# Patient Record
Sex: Male | Born: 1945 | Race: White | Hispanic: No | Marital: Married | Smoking: Former smoker
Health system: Southern US, Community
[De-identification: ages and names within clinical notes are randomized; demographics above are authoritative.]

## PROBLEM LIST (undated history)

## (undated) DIAGNOSIS — I10 Essential (primary) hypertension: Secondary | ICD-10-CM

## (undated) DIAGNOSIS — E119 Type 2 diabetes mellitus without complications: Secondary | ICD-10-CM

## (undated) DIAGNOSIS — I639 Cerebral infarction, unspecified: Secondary | ICD-10-CM

## (undated) DIAGNOSIS — I4891 Unspecified atrial fibrillation: Secondary | ICD-10-CM

## (undated) DIAGNOSIS — I4892 Unspecified atrial flutter: Secondary | ICD-10-CM

## (undated) DIAGNOSIS — I499 Cardiac arrhythmia, unspecified: Secondary | ICD-10-CM

## (undated) DIAGNOSIS — M109 Gout, unspecified: Secondary | ICD-10-CM

## (undated) DIAGNOSIS — J189 Pneumonia, unspecified organism: Secondary | ICD-10-CM

## (undated) DIAGNOSIS — Z9289 Personal history of other medical treatment: Secondary | ICD-10-CM

## (undated) DIAGNOSIS — E785 Hyperlipidemia, unspecified: Secondary | ICD-10-CM

## (undated) HISTORY — DX: Type 2 diabetes mellitus without complications: E11.9

## (undated) HISTORY — PX: CATARACT EXTRACTION: SUR2

## (undated) HISTORY — DX: Cerebral infarction, unspecified: I63.9

## (undated) HISTORY — DX: Personal history of other medical treatment: Z92.89

## (undated) HISTORY — DX: Essential (primary) hypertension: I10

---

## 2008-09-19 ENCOUNTER — Emergency Department (HOSPITAL_COMMUNITY): Admission: EM | Admit: 2008-09-19 | Discharge: 2008-09-19 | Payer: Self-pay | Admitting: Emergency Medicine

## 2013-10-07 ENCOUNTER — Inpatient Hospital Stay (HOSPITAL_COMMUNITY)
Admission: EM | Admit: 2013-10-07 | Discharge: 2013-10-10 | DRG: 062 | Disposition: A | Discharge status: Home / Self Care | Payer: Medicare Other | Attending: Neurology | Admitting: Neurology

## 2013-10-07 ENCOUNTER — Inpatient Hospital Stay (HOSPITAL_COMMUNITY): Payer: Medicare Other

## 2013-10-07 ENCOUNTER — Encounter (HOSPITAL_COMMUNITY): Payer: Self-pay | Admitting: Emergency Medicine

## 2013-10-07 ENCOUNTER — Emergency Department (HOSPITAL_COMMUNITY): Payer: Medicare Other

## 2013-10-07 DIAGNOSIS — F1910 Other psychoactive substance abuse, uncomplicated: Secondary | ICD-10-CM | POA: Diagnosis present

## 2013-10-07 DIAGNOSIS — I4892 Unspecified atrial flutter: Secondary | ICD-10-CM | POA: Diagnosis not present

## 2013-10-07 DIAGNOSIS — I6340 Cerebral infarction due to embolism of unspecified cerebral artery: Principal | ICD-10-CM | POA: Diagnosis present

## 2013-10-07 DIAGNOSIS — E785 Hyperlipidemia, unspecified: Secondary | ICD-10-CM | POA: Diagnosis present

## 2013-10-07 DIAGNOSIS — I428 Other cardiomyopathies: Secondary | ICD-10-CM | POA: Diagnosis present

## 2013-10-07 DIAGNOSIS — I639 Cerebral infarction, unspecified: Secondary | ICD-10-CM

## 2013-10-07 DIAGNOSIS — R7303 Prediabetes: Secondary | ICD-10-CM | POA: Diagnosis present

## 2013-10-07 DIAGNOSIS — I480 Paroxysmal atrial fibrillation: Secondary | ICD-10-CM

## 2013-10-07 DIAGNOSIS — Z87891 Personal history of nicotine dependence: Secondary | ICD-10-CM | POA: Diagnosis not present

## 2013-10-07 DIAGNOSIS — Z8673 Personal history of transient ischemic attack (TIA), and cerebral infarction without residual deficits: Secondary | ICD-10-CM

## 2013-10-07 DIAGNOSIS — R4701 Aphasia: Secondary | ICD-10-CM | POA: Diagnosis present

## 2013-10-07 DIAGNOSIS — R7309 Other abnormal glucose: Secondary | ICD-10-CM | POA: Diagnosis present

## 2013-10-07 DIAGNOSIS — I10 Essential (primary) hypertension: Secondary | ICD-10-CM | POA: Diagnosis present

## 2013-10-07 DIAGNOSIS — R29810 Facial weakness: Secondary | ICD-10-CM | POA: Diagnosis present

## 2013-10-07 DIAGNOSIS — Z8249 Family history of ischemic heart disease and other diseases of the circulatory system: Secondary | ICD-10-CM | POA: Diagnosis not present

## 2013-10-07 DIAGNOSIS — I6350 Cerebral infarction due to unspecified occlusion or stenosis of unspecified cerebral artery: Secondary | ICD-10-CM | POA: Diagnosis present

## 2013-10-07 DIAGNOSIS — M109 Gout, unspecified: Secondary | ICD-10-CM | POA: Diagnosis present

## 2013-10-07 HISTORY — DX: Cerebral infarction, unspecified: I63.9

## 2013-10-07 HISTORY — DX: Gout, unspecified: M10.9

## 2013-10-07 HISTORY — DX: Hyperlipidemia, unspecified: E78.5

## 2013-10-07 HISTORY — DX: Unspecified atrial flutter: I48.92

## 2013-10-07 HISTORY — DX: Unspecified atrial fibrillation: I48.91

## 2013-10-07 LAB — ETHANOL: Alcohol, Ethyl (B): <11 mg/dL (ref 0–11)

## 2013-10-07 LAB — I-STAT CHEM 8, ED
BUN: 11 mg/dL (ref 6–23)
Calcium, Ion: 1.12 mmol/L — ABNORMAL LOW (ref 1.13–1.30)
Chloride: 104 mEq/L (ref 96–112)
Creatinine, Ser: 1.2 mg/dL (ref 0.50–1.35)
Glucose, Bld: 188 mg/dL — ABNORMAL HIGH (ref 70–99)
HCT: 44 % (ref 39.0–52.0)
Hemoglobin: 15 g/dL (ref 13.0–17.0)
Potassium: 4 mEq/L (ref 3.7–5.3)
Sodium: 138 mEq/L (ref 137–147)
TCO2: 21 mmol/L (ref 0–100)

## 2013-10-07 LAB — COMPREHENSIVE METABOLIC PANEL
ALBUMIN: 3.8 g/dL (ref 3.5–5.2)
ALT: 18 U/L (ref 0–53)
ANION GAP: 17 — AB (ref 5–15)
AST: 18 U/L (ref 0–37)
Alkaline Phosphatase: 83 U/L (ref 39–117)
BILIRUBIN TOTAL: 0.4 mg/dL (ref 0.3–1.2)
BUN: 12 mg/dL (ref 6–23)
CHLORIDE: 99 meq/L (ref 96–112)
CO2: 22 mEq/L (ref 19–32)
Calcium: 9.3 mg/dL (ref 8.4–10.5)
Creatinine, Ser: 1.09 mg/dL (ref 0.50–1.35)
GFR calc Af Amer: 79 mL/min — ABNORMAL LOW (ref >90)
GFR calc non Af Amer: 68 mL/min — ABNORMAL LOW (ref >90)
GLUCOSE: 193 mg/dL — AB (ref 70–99)
Potassium: 4.3 mEq/L (ref 3.7–5.3)
Sodium: 138 mEq/L (ref 137–147)
Total Protein: 7.1 g/dL (ref 6.0–8.3)

## 2013-10-07 LAB — URINALYSIS, ROUTINE W REFLEX MICROSCOPIC
Bilirubin Urine: NEGATIVE
GLUCOSE, UA: NEGATIVE mg/dL
Hgb urine dipstick: NEGATIVE
Ketones, ur: NEGATIVE mg/dL
LEUKOCYTES UA: NEGATIVE
Nitrite: NEGATIVE
PROTEIN: NEGATIVE mg/dL
Specific Gravity, Urine: 1.013 (ref 1.005–1.030)
UROBILINOGEN UA: 0.2 mg/dL (ref 0.0–1.0)
pH: 5.5 (ref 5.0–8.0)

## 2013-10-07 LAB — DIFFERENTIAL
BASOS PCT: 1 % (ref 0–1)
Basophils Absolute: 0 10*3/uL (ref 0.0–0.1)
Eosinophils Absolute: 0.3 10*3/uL (ref 0.0–0.7)
Eosinophils Relative: 4 % (ref 0–5)
LYMPHS ABS: 1.8 10*3/uL (ref 0.7–4.0)
Lymphocytes Relative: 22 % (ref 12–46)
MONOS PCT: 5 % (ref 3–12)
Monocytes Absolute: 0.4 10*3/uL (ref 0.1–1.0)
NEUTROS ABS: 5.5 10*3/uL (ref 1.7–7.7)
NEUTROS PCT: 68 % (ref 43–77)

## 2013-10-07 LAB — RAPID URINE DRUG SCREEN, HOSP PERFORMED
AMPHETAMINES: NOT DETECTED
BARBITURATES: NOT DETECTED
Benzodiazepines: NOT DETECTED
Cocaine: NOT DETECTED
Opiates: NOT DETECTED
TETRAHYDROCANNABINOL: NOT DETECTED

## 2013-10-07 LAB — PROTIME-INR
INR: 1.08 (ref 0.00–1.49)
Prothrombin Time: 14 seconds (ref 11.6–15.2)

## 2013-10-07 LAB — I-STAT TROPONIN, ED: Troponin i, poc: 0.02 ng/mL (ref 0.00–0.08)

## 2013-10-07 LAB — CBC
HCT: 41.9 % (ref 39.0–52.0)
HEMOGLOBIN: 14.5 g/dL (ref 13.0–17.0)
MCH: 31.3 pg (ref 26.0–34.0)
MCHC: 34.6 g/dL (ref 30.0–36.0)
MCV: 90.5 fL (ref 78.0–100.0)
Platelets: 217 10*3/uL (ref 150–400)
RBC: 4.63 MIL/uL (ref 4.22–5.81)
RDW: 12.8 % (ref 11.5–15.5)
WBC: 8 10*3/uL (ref 4.0–10.5)

## 2013-10-07 LAB — MRSA PCR SCREENING: MRSA by PCR: NEGATIVE

## 2013-10-07 LAB — GLUCOSE, CAPILLARY: Glucose-Capillary: 131 mg/dL — ABNORMAL HIGH (ref 70–99)

## 2013-10-07 LAB — CBG MONITORING, ED
Glucose-Capillary: 150 mg/dL — ABNORMAL HIGH (ref 70–99)
Glucose-Capillary: 103 mg/dL — ABNORMAL HIGH (ref 70–99)

## 2013-10-07 LAB — APTT: APTT: 29 s (ref 24–37)

## 2013-10-07 MED ORDER — SODIUM CHLORIDE 0.9 % IV SOLN
Freq: Once | INTRAVENOUS | Status: AC
  Administered 2013-10-07: 14:00:00 via INTRAVENOUS

## 2013-10-07 MED ORDER — ACETAMINOPHEN 325 MG PO TABS: 650.0000 mg | ORAL_TABLET | ORAL | Status: DC | PRN

## 2013-10-07 MED ORDER — LABETALOL HCL 5 MG/ML IV SOLN: 10.0000 mg | INTRAVENOUS | Status: DC | PRN

## 2013-10-07 MED ORDER — SENNOSIDES-DOCUSATE SODIUM 8.6-50 MG PO TABS
1.0000 | ORAL_TABLET | Freq: Every evening | ORAL | Status: DC | PRN
  Filled 2013-10-07: qty 1

## 2013-10-07 MED ORDER — INSULIN ASPART 100 UNIT/ML ~~LOC~~ SOLN
0.0000 [IU] | Freq: Three times a day (TID) | SUBCUTANEOUS | Status: DC
  Administered 2013-10-08: 3 [IU] via SUBCUTANEOUS
  Administered 2013-10-09: 8 [IU] via SUBCUTANEOUS

## 2013-10-07 MED ORDER — ACETAMINOPHEN 650 MG RE SUPP: 650.0000 mg | RECTAL | Status: DC | PRN

## 2013-10-07 MED ORDER — INSULIN ASPART 100 UNIT/ML ~~LOC~~ SOLN: 0.0000 [IU] | Freq: Every day | SUBCUTANEOUS | Status: DC

## 2013-10-07 MED ORDER — LABETALOL HCL 5 MG/ML IV SOLN
INTRAVENOUS | Status: AC
  Filled 2013-10-07: qty 4

## 2013-10-07 MED ORDER — IOHEXOL 350 MG/ML SOLN
50.0000 mL | Freq: Once | INTRAVENOUS | Status: AC | PRN
  Administered 2013-10-07: 50 mL via INTRAVENOUS

## 2013-10-07 MED ORDER — ALTEPLASE (STROKE) FULL DOSE INFUSION
79.0000 mg | Freq: Once | INTRAVENOUS | Status: AC
  Administered 2013-10-07: 79 mg via INTRAVENOUS
  Filled 2013-10-07: qty 79

## 2013-10-07 MED ORDER — LABETALOL HCL 5 MG/ML IV SOLN
5.0000 mg | Freq: Once | INTRAVENOUS | Status: AC
  Administered 2013-10-07: 5 mg via INTRAVENOUS

## 2013-10-07 MED ORDER — PANTOPRAZOLE SODIUM 40 MG IV SOLR
40.0000 mg | Freq: Every day | INTRAVENOUS | Status: DC
  Administered 2013-10-07 – 2013-10-08 (×2): 40 mg via INTRAVENOUS
  Filled 2013-10-07 (×3): qty 40

## 2013-10-07 MED ORDER — SODIUM CHLORIDE 0.9 % IV SOLN
INTRAVENOUS | Status: DC
Start: 2013-10-07 — End: 2013-10-08
  Administered 2013-10-08: 04:00:00 via INTRAVENOUS

## 2013-10-07 MED ORDER — STROKE: EARLY STAGES OF RECOVERY BOOK
Freq: Once | Status: AC
  Administered 2013-10-07: 21:00:00
  Filled 2013-10-07: qty 1

## 2013-10-07 NOTE — ED Notes (Signed)
Diet tray ordered 

## 2013-10-07 NOTE — Code Documentation (Signed)
68yo male arriving to [facility] via [facility] at 1218.  Patient was LKW at 1115 per family when his son talked to him over the phone.  Per EMS a man came over to the patient's house to pay for a car and when patient was addressed by the individual he had difficulty talking.  The man called his son at 20 to report the issue.  EMS was activated and called a Code Stroke for expressive aphasia.  Patient was cleared at the bridge and was taken to CT.  Pharmacy notified to mix tPA at 1230 and delivered tPA at 1239.  NIHSS 5, see documentation for details and times.  tPA bolus given at 1243 followed by the drip.  SBP 186 at 1245 and tPA on hold per Dr. Amada Jupiter.  SBP 178 at 1250 and tPA restarted per Dr. Amada Jupiter.  Labetalol 5mg  IVP given per MD.  Patient taken to CTA and results reviewed by MD.  Plan of care discussed with patient and family.  Patient to be admitted to ICU.  NS flush hung at 1337.  Bedside handoff with ED RN Greig Castilla.

## 2013-10-07 NOTE — H&P (Signed)
H&P    Chief Complaint: Stroke  HPI:                                                                                                                                         Jonathon Stevens is an 68 y.o. male with non significant PMHx other than gout.  Patient was out in his yard selling a car when the buyer noted he was having difficulty expressing himself and had a right facial droop.  EMS was called and on scene noted his right facial asymmetry and expressive aphasia. Pateint was brought to [facility] ED where CT head was negative but patient had persistent expressive aphasia. The discussion was made with his wife and tPA was started.   Date last known well: Date: 10/07/2013 Time last known well: Time: 08:30 tPA Given: Yes  Past Medical History  Diagnosis Date  . Gout     No past surgical history on file.  Family History  Problem Relation Age of Onset  . Hypertension Mother   . Hypertension Father    Social History:  reports that he uses illicit drugs about once per week. He reports that he does not drink alcohol. His tobacco history is not on file.  Allergies: No Known Allergies  Medications:                                                                                                                           Current Facility-Administered Medications  Medication Dose Route Frequency Provider Last Rate Last Dose  . alteplase (ACTIVASE) 1 mg/mL infusion 79 mg  79 mg Intravenous Once Raeford Razor, MD 79 mL/hr at 10/07/13 1253 79 mg at 10/07/13 1253   No current outpatient prescriptions on file.     ROS:  History obtained from family  General ROS: negative for - chills, fatigue, fever, night sweats, weight gain or weight loss Psychological ROS: negative for - behavioral disorder, hallucinations, memory difficulties, mood swings or suicidal  ideation Ophthalmic ROS: negative for - blurry vision, double vision, eye pain or loss of vision ENT ROS: negative for - epistaxis, nasal discharge, oral lesions, sore throat, tinnitus or vertigo Allergy and Immunology ROS: negative for - hives or itchy/watery eyes Hematological and Lymphatic ROS: negative for - bleeding problems, bruising or swollen lymph nodes Endocrine ROS: negative for - galactorrhea, hair pattern changes, polydipsia/polyuria or temperature intolerance Respiratory ROS: negative for - cough, hemoptysis, shortness of breath or wheezing Cardiovascular ROS: negative for - chest pain, dyspnea on exertion, edema or irregular heartbeat Gastrointestinal ROS: negative for - abdominal pain, diarrhea, hematemesis, nausea/vomiting or stool incontinence Genito-Urinary ROS: negative for - dysuria, hematuria, incontinence or urinary frequency/urgency Musculoskeletal ROS: negative for - joint swelling or muscular weakness Neurological ROS: as noted in HPI Dermatological ROS: negative for rash and skin lesion changes  Neurologic Examination:                                                                                                      Blood pressure 176/69, pulse 72, temperature 97.9 F (36.6 C), temperature source Oral, resp. rate 16, height 5\' 8"  (1.727 m), weight 87.091 kg (192 lb), SpO2 97.00%.   General Exam: CV: RRR S1, S2 ABD: Soft NT,ND Lungs: Clear to ausculation. Skin: WDI   General: In bed with NAD Mental Status: Alert, oriented, thought content appropriate.  Speech shows expressive aphasia.  Able to follow commands without difficulty. Cranial Nerves: II: Discs flat bilaterally; Visual fields grossly normal, pupils equal, round, reactive to light and accommodation III,IV, VI: ptosis not present, extra-ocular motions intact bilaterally V,VII: Right facial weakness, facial light touch sensation normal bilaterally VIII: hearing normal bilaterally IX,X: gag reflex  present XI: bilateral shoulder shrug XII: midline tongue extension without atrophy or fasciculations  Motor: Right : Upper extremity   5/5    Left:     Upper extremity   5/5  Lower extremity   5/5     Lower extremity   5/5 Tone and bulk:normal tone throughout; no atrophy noted Sensory: Pinprick and light touch intact throughout, bilaterally Deep Tendon Reflexes:  Right: Upper Extremity   Left: Upper extremity   biceps (C-5 to C-6) 2/4   biceps (C-5 to C-6) 2/4 tricep (C7) 2/4    triceps (C7) 2/4 Brachioradialis (C6) 2/4  Brachioradialis (C6) 2/4  Lower Extremity Lower Extremity  quadriceps (L-2 to L-4) 2/4   quadriceps (L-2 to L-4) 2/4 Achilles (S1) 2/4   Achilles (S1) 2/4  Plantars: Right: downgoing   Left: downgoing Cerebellar: normal finger-to-nose,  normal heel-to-shin test Gait: not tested CV: pulses palpable throughout    Lab Results: Basic Metabolic Panel:  Recent Labs Lab 10/07/13 1226  NA 138  K 4.0  CL 104  GLUCOSE 188*  BUN 11  CREATININE 1.20    Liver Function Tests: No results found for this basename: AST,  ALT, ALKPHOS, BILITOT, PROT, ALBUMIN,  in the last 168 hours No results found for this basename: LIPASE, AMYLASE,  in the last 168 hours No results found for this basename: AMMONIA,  in the last 168 hours  CBC:  Recent Labs Lab 10/07/13 1222 10/07/13 1226  WBC 8.0  --   NEUTROABS 5.5  --   HGB 14.5 15.0  HCT 41.9 44.0  MCV 90.5  --   PLT 217  --     Cardiac Enzymes: No results found for this basename: CKTOTAL, CKMB, CKMBINDEX, TROPONINI,  in the last 168 hours  Lipid Panel: No results found for this basename: CHOL, TRIG, HDL, CHOLHDL, VLDL, LDLCALC,  in the last 168 hours  CBG:  Recent Labs Lab 10/07/13 1238  GLUCAP 150*    Microbiology: No results found for this or any previous visit.  Coagulation Studies:  Recent Labs  10/07/13 1222  LABPROT 14.0  INR 1.08    Imaging: Ct Head Wo Contrast  10/07/2013   CLINICAL  DATA:  Eight day shift.  Facial droop.  EXAM: CT HEAD WITHOUT CONTRAST  TECHNIQUE: Contiguous axial images were obtained from the base of the skull through the vertex without intravenous contrast.  COMPARISON:  None.  FINDINGS: No intracranial hemorrhage.  The left carotid terminus appears dense which may indicate thrombus potentially leading to left hemispheric infarct.  Remote small left cerebellar infarcts suspected rather than horizontal fissure.  Streak artifact through the pons.  No hydrocephalus.  No intracranial mass lesion noted on this unenhanced exam.  Left sphenoid sinus air cell mild mucosal thickening and ethmoid sinus air cell mild mucosal thickening bilaterally.  IMPRESSION: No intracranial hemorrhage.  The left carotid terminus appears dense which may indicate thrombus potentially leading to left hemispheric infarct.  Remote small left cerebellar infarcts suspected rather than horizontal fissure.  Streak artifact through the pons.  Mild mucosal thickening left sphenoid sinus air cell and ethmoid sinus air cells.  These results were called by telephone at the time of interpretation on 10/07/2013 at 12:35 pm to Dr. Ritta Slot , who verbally acknowledged these results.   Electronically Signed   By: Bridgett Larsson M.D.   On: 10/07/2013 12:44       Assessment and plan discussed with with attending physician and they are in agreement.    Felicie Morn PA-C [facility] [phone]  10/07/2013, 1:02 PM   Assessment: 68 y.o. male presenting to hospital with acute CVA with symptoms of sudden onset expressive aphasia and right facial droop . Patient was within the tPA window and tPA was initiated. Patient will be admitted to the hospital ICU floor.  Stroke Risk Factors - none  1. HgbA1c, fasting lipid panel 2. MRI, MRA  of the brain without contrast 3. PT consult, OT consult, Speech consult 4. Echocardiogram 5. Carotid dopplers 6. Prophylactic therapy-None 7. Risk factor  modification 8. Telemetry monitoring 9. Frequent neuro checks 10. P control to keep <180/105  This patient is critically ill and at significant risk of neurological worsening, death and care requires constant monitoring of vital signs, hemodynamics,respiratory and cardiac monitoring, neurological assessment, discussion with family, other specialists and medical decision making of high complexity. I spent 50 minutes of neurocritical care time  in the care of  this patient.  Ritta Slot, MD [facility] [phone]  If 7pm- 7am, please page neurology on call as listed in AMION. 10/07/2013  6:41 PM

## 2013-10-07 NOTE — ED Notes (Signed)
Patient transported by [facility] EMS for possible stroke.  Patient has slurred speech, right sided facial droop and trouble forming words.  Last seen normal 0830.

## 2013-10-08 ENCOUNTER — Encounter (HOSPITAL_COMMUNITY): Payer: Self-pay | Admitting: Neurology

## 2013-10-08 ENCOUNTER — Inpatient Hospital Stay (HOSPITAL_COMMUNITY): Payer: Medicare Other

## 2013-10-08 DIAGNOSIS — I517 Cardiomegaly: Secondary | ICD-10-CM

## 2013-10-08 LAB — LIPID PANEL
Cholesterol: 175 mg/dL (ref 0–200)
HDL: 41 mg/dL (ref >39)
LDL CALC: 111 mg/dL — AB (ref 0–99)
Total CHOL/HDL Ratio: 4.3 RATIO
Triglycerides: 114 mg/dL (ref <150)
VLDL: 23 mg/dL (ref 0–40)

## 2013-10-08 LAB — GLUCOSE, CAPILLARY
GLUCOSE-CAPILLARY: 102 mg/dL — AB (ref 70–99)
GLUCOSE-CAPILLARY: 190 mg/dL — AB (ref 70–99)
Glucose-Capillary: 106 mg/dL — ABNORMAL HIGH (ref 70–99)
Glucose-Capillary: 133 mg/dL — ABNORMAL HIGH (ref 70–99)

## 2013-10-08 LAB — HEMOGLOBIN A1C
Hgb A1c MFr Bld: 6.5 % — ABNORMAL HIGH (ref <5.7)
Mean Plasma Glucose: 140 mg/dL — ABNORMAL HIGH (ref <117)

## 2013-10-08 MED ORDER — SODIUM CHLORIDE 0.9 % IV SOLN
INTRAVENOUS | Status: DC
  Administered 2013-10-08: 23:00:00 via INTRAVENOUS
  Administered 2013-10-09: 500 mL via INTRAVENOUS

## 2013-10-08 MED ORDER — ASPIRIN EC 325 MG PO TBEC
325.0000 mg | DELAYED_RELEASE_TABLET | Freq: Every day | ORAL | Status: DC
  Administered 2013-10-08 – 2013-10-10 (×2): 325 mg via ORAL
  Filled 2013-10-08 (×2): qty 1

## 2013-10-08 NOTE — Evaluation (Signed)
Physical Therapy Evaluation and Discharge Patient Details Name: Jonathon Stevens MRN: [id number] DOB: July 05, 1945 Today's Date: 10/08/2013   History of Present Illness  Jonathon Stevens is an 68 y.o. male with non significant PMHx other than gout. Patient was out in his yard selling a car when the buyer noted he was having difficulty expressing himself and had a right facial droop. EMS was called and on scene noted his right facial asymmetry and expressive aphasia. Pateint was brought to [facility] ED where CT head was negative but patient had persistent expressive aphasia. The discussion was made with his wife and tPA was started.    Clinical Impression  Pt admitted with above. Pt now functioning at baseline. Pt with no acute PT needs at this time. PT signing off, thank you for the referral and re-consult if needed in future.    Follow Up Recommendations No PT follow up    Equipment Recommendations  None recommended by PT    Recommendations for Other Services       Precautions / Restrictions Precautions Precautions: None Restrictions Weight Bearing Restrictions: No      Mobility  Bed Mobility Overal bed mobility: Independent                Transfers Overall transfer level: Modified independent Equipment used: None             General transfer comment: use of hands  Ambulation/Gait Ambulation/Gait assistance: Independent Ambulation Distance (Feet): 500 Feet Assistive device: None     Gait velocity interpretation: at or above normal speed for age/gender General Gait Details: no episodes of LOB, safe pattern  Stairs Stairs: Yes Stairs assistance: Modified independent (Device/Increase time) Stair Management: One rail Right;Alternating pattern Number of Stairs: 12    Wheelchair Mobility    Modified Rankin (Stroke Patients Only) Modified Rankin (Stroke Patients Only) Pre-Morbid Rankin Score: No symptoms Modified Rankin: No symptoms     Balance Overall  balance assessment: Independent                               Standardized Balance Assessment Standardized Balance Assessment : Dynamic Gait Index   Dynamic Gait Index Level Surface: Normal Change in Gait Speed: Normal Gait with Horizontal Head Turns: Normal Gait with Vertical Head Turns: Normal Gait and Pivot Turn: Normal Step Over Obstacle: Normal Step Around Obstacles: Normal Steps: Mild Impairment Total Score: 23       Pertinent Vitals/Pain Denies pain    Home Living Family/patient expects to be discharged to:: Private residence Living Arrangements: Spouse/significant other Available Help at Discharge: Family;Available PRN/intermittently Type of Home: House Home Access: Stairs to enter Entrance Stairs-Rails: None Entrance Stairs-Number of Steps: 3 Home Layout: One level Home Equipment: None      Prior Function Level of Independence: Independent         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     Hand Dominance   Dominant Hand: Right    Extremity/Trunk Assessment   Upper Extremity Assessment: Overall WFL for tasks assessed           Lower Extremity Assessment: Overall WFL for tasks assessed      Cervical / Trunk Assessment: Normal  Communication   Communication: No difficulties  Cognition Arousal/Alertness: Awake/alert Behavior During Therapy: WFL for tasks assessed/performed Overall Cognitive Status: Within Functional Limits for tasks assessed  General Comments      Exercises        Assessment/Plan    PT Assessment Patent does not need any further PT services  PT Diagnosis     PT Problem List    PT Treatment Interventions     PT Goals (Current goals can be found in the Care Plan section) Acute Rehab PT Goals Patient Stated Goal: home asap PT Goal Formulation: No goals set, d/c therapy    Frequency     Barriers to discharge        Co-evaluation               End of Session   Activity  Tolerance: Patient tolerated treatment well Patient left: in bed;with call bell/phone within reach;with family/visitor present Nurse Communication: Mobility status         Time: 8250-5397 PT Time Calculation (min): 20 min   Charges:   PT Evaluation $Initial PT Evaluation Tier I: 1 Procedure PT Treatments $Gait Training: 8-22 mins   PT G CodesMarcene Brawn 10/08/2013, 4:43 PM  Lewis Shock, PT, DPT Pager #: [phone] Office #: [phone]

## 2013-10-08 NOTE — Progress Notes (Signed)
    Pt is scheduled for a TEE with Dr. Rennis Golden tomorrow. I explained purpose and risk of procedure. Orders are written. Pt will be NPO at midnight. All questions were answered. Order was placed for RN to have patient sign consent.   Robbie Lis, PA-C

## 2013-10-08 NOTE — Progress Notes (Signed)
Patient walked up to the unit with no difficulties. He is AAOx4, no pain, vitals stable, telemetry placed. Patient was oriented to the room and questions were answered. Will continue to monitor. Ashton Sabine, [place]

## 2013-10-08 NOTE — Progress Notes (Signed)
Pt transported to [address] [place] with no incident.

## 2013-10-08 NOTE — Progress Notes (Signed)
SLP Cancellation Note  Patient Details Name: Jeannette Palladino MRN: [id number] DOB: 1945-11-08   Cancelled treatment:       Reason Eval/Treat Not Completed: SLP screened, no needs identified, will sign off   Blenda Mounts Laurice 10/08/2013, 3:36 PM

## 2013-10-08 NOTE — Progress Notes (Signed)
Stroke Team Progress Note  HISTORY Chief Complaint: Stroke  HPI:  Jonathon Stevens is an 68 y.o. male with non significant PMHx other than gout. Patient was out in his yard selling a car when the buyer noted he was having difficulty expressing himself and had a right facial droop. EMS was called and on scene noted his right facial asymmetry and expressive aphasia. Pateint was brought to [facility] ED where CT head was negative but patient had persistent expressive aphasia. The discussion was made with his wife and tPA was started.  Date last known well: Date: 10/07/2013  Time last known well: Time: 08:30  tPA Given: Yes   He was admitted to the neuro ICU for further evaluation and treatment.  SUBJECTIVE  No acute events overnight. Family is at the bedside. The patient is alert and conversant, and states his speech has improved completely back to baseline. Blood pressure was well controlled last night. OBJECTIVE Most recent Vital Signs: Filed Vitals:   10/08/13 0500 10/08/13 0600 10/08/13 0700 10/08/13 0800  BP: 118/69 125/79 130/66 142/62  Pulse: 50 53 52 58  Temp:      TempSrc:      Resp: 9 14 13 14   Height:      Weight:      SpO2: 97% 94% 93% 94%   CBG (last 3)   Recent Labs  10/07/13 1652 10/07/13 2224 10/08/13 0751  GLUCAP 103* 131* 102*    IV Fluid Intake:   . sodium chloride 75 mL/hr at 10/08/13 0400    MEDICATIONS  . insulin aspart  0-15 Units Subcutaneous TID WC  . insulin aspart  0-5 Units Subcutaneous QHS  . pantoprazole (PROTONIX) IV  40 mg Intravenous QHS   PRN:  acetaminophen, acetaminophen, labetalol, senna-docusate  Diet:  General thin liquids Activity:  Bedrest DVT Prophylaxis: none, got tPA  CLINICALLY SIGNIFICANT STUDIES Basic Metabolic Panel:  Recent Labs Lab 10/07/13 1222 10/07/13 1226  NA 138 138  K 4.3 4.0  CL 99 104  CO2 22  --   GLUCOSE 193* 188*  BUN 12 11  CREATININE 1.09 1.20  CALCIUM 9.3  --    Liver Function Tests:  Recent  Labs Lab 10/07/13 1222  AST 18  ALT 18  ALKPHOS 83  BILITOT 0.4  PROT 7.1  ALBUMIN 3.8   CBC:  Recent Labs Lab 10/07/13 1222 10/07/13 1226  WBC 8.0  --   NEUTROABS 5.5  --   HGB 14.5 15.0  HCT 41.9 44.0  MCV 90.5  --   PLT 217  --    Coagulation:  Recent Labs Lab 10/07/13 1222  LABPROT 14.0  INR 1.08   Cardiac Enzymes: No results found for this basename: CKTOTAL, CKMB, CKMBINDEX, TROPONINI,  in the last 168 hours Urinalysis:  Recent Labs Lab 10/07/13 1400  COLORURINE YELLOW  LABSPEC 1.013  PHURINE 5.5  GLUCOSEU NEGATIVE  HGBUR NEGATIVE  BILIRUBINUR NEGATIVE  KETONESUR NEGATIVE  PROTEINUR NEGATIVE  UROBILINOGEN 0.2  NITRITE NEGATIVE  LEUKOCYTESUR NEGATIVE   Lipid Panel    Component Value Date/Time   CHOL 175 10/08/2013 0241   TRIG 114 10/08/2013 0241   HDL 41 10/08/2013 0241   CHOLHDL 4.3 10/08/2013 0241   VLDL 23 10/08/2013 0241   LDLCALC 111* 10/08/2013 0241   HgbA1C  No results found for this basename: HGBA1C    Urine Drug Screen:     Component Value Date/Time   LABOPIA NONE DETECTED 10/07/2013 1400   COCAINSCRNUR NONE DETECTED 10/07/2013 1400  LABBENZ NONE DETECTED 10/07/2013 1400   AMPHETMU NONE DETECTED 10/07/2013 1400   THCU NONE DETECTED 10/07/2013 1400   LABBARB NONE DETECTED 10/07/2013 1400    Alcohol Level:  Recent Labs Lab 10/07/13 1222  ETH <11    Ct Angio Head W/cm &/or Wo Cm  10/07/2013    IMPRESSION: 1. Left MCA M2 branch 2 mm filling defect with M2 stenosis and possible associated M3 branch occlusion. 2. Abundant soft plaque left carotid bifurcation. No hemodynamically significant left ICA stenosis. Incidental short-segment left ECA occlusion. 3. Mild-to-moderate extracranial atherosclerosis elsewhere. Intracranial CTA otherwise negative. 4. No CT changes of cortically based infarct, mass effect, or hemorrhage. Questionable early hypodensity left corona radiata.   Ct Head Wo Contrast  10/07/2013  IMPRESSION: No intracranial  hemorrhage.  The left carotid terminus appears dense which may indicate thrombus potentially leading to left hemispheric infarct.  Remote small left cerebellar infarcts suspected rather than horizontal fissure.  Streak artifact through the pons.  Mild mucosal thickening left sphenoid sinus air cell and ethmoid sinus air cells.    Ct Angio Neck W/cm &/or Wo/cm  10/07/2013    IMPRESSION: 1. Left MCA M2 branch 2 mm filling defect with M2 stenosis and possible associated M3 branch occlusion. 2. Abundant soft plaque left carotid bifurcation. No hemodynamically significant left ICA stenosis. Incidental short-segment left ECA occlusion. 3. Mild-to-moderate extracranial atherosclerosis elsewhere. Intracranial CTA otherwise negative. 4. No CT changes of cortically based infarct, mass effect, or hemorrhage. Questionable early hypodensity left corona radiata.   Dg Chest Port 1 View  10/07/2013   IMPRESSION: Borderline cardiomegaly.  No acute findings.     MRI of the brain    MRA of the brain    Carotid Doppler  Cancelled, did CTA neck  2D Echocardiogram    EKG Sinus rhythm. For complete results please see formal report.   Therapy Recommendations   Physical Exam Blood pressure 142/62, pulse 58, temperature 98.4 F (36.9 C), temperature source Oral, resp. rate 14, height 5\' 8"  (1.727 m), weight 87.091 kg (192 lb), SpO2 94.00%. Gen: Patient is well developed, well nourished man in no acute distress.  Cardiac: RRR. S1S2 audible. No M/R/G.  Extremities: Cap refill <2 secs. No cyanosis or edema. Pulses 2+ radial and DP. Pulmonary: Respirations regular, symmetric. Lungs clear to auscultation bilat. Abd: Soft, non-tender. BS audible x 4 quadrants.  G/U: Deferred  MS: Alert, follows commands. Oriented to person, place, time, and event. Speech: Speech fluent and non-dysarthric. Able to name and repeat. No alexia or agraphia.   CN: No visual field cut. PERRL. EOMs intact. Facial sensation intact V1-3. No  facial droop. Hearing grossly intact. Strong cough. Sternocleidomastoids and trapezius 5/5 strength. Tongue midline, full strength, no atrophy or fasciculations.  Strength: 5/5 in all four extremities proximally and distally.  Sensation: Intact to light touch in all four extremities.  Coordination: No ataxia or dysmetria on FTN or HTS bilat. Gait steady.  Proprioception: Negative Romberg.   Reflexes: 2+ biceps, brachioradialis bilat. 2+ patellar, achilles bilat. No clonus. Downgoing toes bilat.  NIHSS 0  ASSESSMENT [name] is a 68 y.o. male presenting with R facial droop and aphasia. Status post IV t-PA 10/07/13 at 1239. Imaging suspect left brain infarct. Infarct felt to be embolic underlying etiology unknown.  Patient not on antithrombotics  prior to admission. Currently holding antithrombotics for 24 hours post tPA.Marland Kitchen. Patient with resultant . Stroke work up underway.   TEE, LOOP tomorrow, NPO after 2400  LDL  PT/OT/Speech   Hospital day # 1  TREATMENT/PLAN Continue strict neurological follow up and blood pressure management per post TPA protocol. Check 24 brain imaging and if negative for bleed start aspirin. Mobilize out of bed with therapy consults. Transferred out of the ICU later today. Discussed with patient and family and answered questions SIGNED Stephani Police, NP  I, the attending vascular neurologist, have personally obtained a history, examined the patient, evaluated laboratory data and imaging studies, and formulated the assessment and plan of care.  I have made any additions or clarifications directly to the above note and agree with the findings and plan as currently documented. Delia Heady, MD   To contact Stroke Continuity provider, please refer to [email or web address]. After hours, contact General Neurology

## 2013-10-08 NOTE — Progress Notes (Signed)
  Echocardiogram 2D Echocardiogram has been performed.  Arvil Chaco 10/08/2013, 9:39 AM

## 2013-10-08 NOTE — Progress Notes (Signed)
UR completed.  Chlora Mcbain, RN BSN MHA CCM Trauma/Neuro ICU Case Manager [phone]  

## 2013-10-09 ENCOUNTER — Encounter (HOSPITAL_COMMUNITY)
Admission: EM | Disposition: A | Discharge status: Home / Self Care | Payer: Self-pay | Source: Home / Self Care | Attending: Neurology

## 2013-10-09 ENCOUNTER — Encounter (HOSPITAL_COMMUNITY): Payer: Self-pay | Admitting: *Deleted

## 2013-10-09 DIAGNOSIS — I4892 Unspecified atrial flutter: Secondary | ICD-10-CM | POA: Diagnosis present

## 2013-10-09 DIAGNOSIS — I428 Other cardiomyopathies: Secondary | ICD-10-CM

## 2013-10-09 HISTORY — PX: TEE WITHOUT CARDIOVERSION: SHX5443

## 2013-10-09 LAB — GLUCOSE, CAPILLARY
GLUCOSE-CAPILLARY: 101 mg/dL — AB (ref 70–99)
GLUCOSE-CAPILLARY: 128 mg/dL — AB (ref 70–99)
Glucose-Capillary: 265 mg/dL — ABNORMAL HIGH (ref 70–99)
Glucose-Capillary: 127 mg/dL — ABNORMAL HIGH (ref 70–99)

## 2013-10-09 SURGERY — ECHOCARDIOGRAM, TRANSESOPHAGEAL
Anesthesia: Moderate Sedation

## 2013-10-09 MED ORDER — ATORVASTATIN CALCIUM 10 MG PO TABS
10.0000 mg | ORAL_TABLET | Freq: Every day | ORAL | Status: DC
  Administered 2013-10-09: 10 mg via ORAL
  Filled 2013-10-09: qty 1

## 2013-10-09 MED ORDER — MIDAZOLAM HCL 10 MG/2ML IJ SOLN
INTRAMUSCULAR | Status: DC | PRN
  Administered 2013-10-09 (×2): 2 mg via INTRAVENOUS

## 2013-10-09 MED ORDER — LIDOCAINE VISCOUS 2 % MT SOLN
OROMUCOSAL | Status: DC | PRN
  Administered 2013-10-09: 12 mL via OROMUCOSAL

## 2013-10-09 MED ORDER — FENTANYL CITRATE 0.05 MG/ML IJ SOLN
INTRAMUSCULAR | Status: DC | PRN
  Administered 2013-10-09 (×2): 25 ug via INTRAVENOUS

## 2013-10-09 MED ORDER — FENTANYL CITRATE 0.05 MG/ML IJ SOLN
INTRAMUSCULAR | Status: AC
  Filled 2013-10-09: qty 2

## 2013-10-09 MED ORDER — MIDAZOLAM HCL 5 MG/ML IJ SOLN
INTRAMUSCULAR | Status: AC
  Filled 2013-10-09: qty 2

## 2013-10-09 MED ORDER — METOPROLOL TARTRATE 12.5 MG HALF TABLET
12.5000 mg | ORAL_TABLET | Freq: Two times a day (BID) | ORAL | Status: DC
  Administered 2013-10-09 – 2013-10-10 (×2): 12.5 mg via ORAL
  Filled 2013-10-09 (×2): qty 1

## 2013-10-09 MED ORDER — BUTAMBEN-TETRACAINE-BENZOCAINE 2-2-14 % EX AERO
INHALATION_SPRAY | CUTANEOUS | Status: DC | PRN
  Administered 2013-10-09: 2 via TOPICAL

## 2013-10-09 MED ORDER — METOPROLOL TARTRATE 1 MG/ML IV SOLN
2.5000 mg | INTRAVENOUS | Status: DC | PRN
  Administered 2013-10-09: 2.5 mg via INTRAVENOUS
  Filled 2013-10-09: qty 5

## 2013-10-09 MED ORDER — LIDOCAINE VISCOUS 2 % MT SOLN
OROMUCOSAL | Status: AC
  Filled 2013-10-09: qty 15

## 2013-10-09 MED ORDER — MIDAZOLAM HCL 5 MG/ML IJ SOLN
INTRAMUSCULAR | Status: AC
  Filled 2013-10-09: qty 1

## 2013-10-09 NOTE — Progress Notes (Addendum)
Patient in new atrial fibrillation w/ RVR post TEE. Embolic source of stroke now identified. Loop recorder canceled. Cardiology consult requested for atrial fibrillation eval and management. Will not discharge today as planned.  Discussed with Dr. Pearlean Brownie as well as pt's wife via telephone.  Annie Main, MSN, RN, ANVP-BC, ANP-BC, GNP-BC [facility] Pager: [phone] 10/09/2013 3:28 PM

## 2013-10-09 NOTE — Progress Notes (Signed)
MD notified patient is in AFIB.  EKG was done by this RN and has notified Jannetta Quint, NP about results.  Will continue to monitor patient.

## 2013-10-09 NOTE — H&P (Signed)
     INTERVAL PROCEDURE H&P  History and Physical Interval Note:  10/09/2013 12:20 PM  Jonathon Stevens has presented today for their planned procedure. The various methods of treatment have been discussed with the patient and family. After consideration of risks, benefits and other options for treatment, the patient has consented to the procedure.  The patients' outpatient history has been reviewed, patient examined, and no change in status from most recent office note within the past 30 days. I have reviewed the patients' chart and labs and will proceed as planned. Questions were answered to the patient's satisfaction.   Chrystie Nose, MD, [facility] Attending Cardiologist [facility]  Shatha Hooser C 10/09/2013, 12:20 PM

## 2013-10-09 NOTE — CV Procedure (Addendum)
    TRANSESOPHAGEAL ECHOCARDIOGRAM (TEE) NOTE  INDICATIONS: Stroke, source of embolus  PROCEDURE:   Informed consent was obtained prior to the procedure. The risks, benefits and alternatives for the procedure were discussed and the patient comprehended these risks.  Risks include, but are not limited to, cough, sore throat, vomiting, nausea, somnolence, esophageal and stomach trauma or perforation, bleeding, low blood pressure, aspiration, pneumonia, infection, trauma to the teeth and death.    After a procedural time-out, the patient was given 4 mg versed and 50 mcg fentanyl for moderate sedation.  The oropharynx was anesthetized 10 cc of topical 1% viscous lidocaine and 2 cetacaine sprays.  The transesophageal probe was inserted in the esophagus and stomach without difficulty and multiple views were obtained.  The patient was kept under observation until the patient left the procedure room.  The patient left the procedure room in stable condition.   Agitated microbubble saline contrast was administered.  COMPLICATIONS:    There were no immediate complications.  Findings:  1. LEFT VENTRICLE: The left ventricular wall thickness is mildly increased.  The left ventricular cavity is normal in size. Wall motion is normal.  LVEF is 50-55%.  2. RIGHT VENTRICLE:  The right ventricle is normal in structure and function without any thrombus or masses.    3. LEFT ATRIUM:  The left atrium is moderately dilated in size without any thrombus or masses.  There is spontaneous echo contrast ("smoke") in the left atrium consistent with a low flow state.  4. LEFT ATRIAL APPENDAGE:  The left atrial demonstrates low emptying velocity. The moderate-sized appendage has single lobes. Pulse doppler indicates low flow in the appendage.  5. ATRIAL SEPTUM:  The atrial septum appears intact and is free of thrombus and/or masses.  There is no evidence for interatrial shunting by color doppler and saline  microbubble.  6. RIGHT ATRIUM:  The right atrium is normal in size and function without any thrombus or masses.  7. MITRAL VALVE:  The mitral valve is normal in structure and function with trace to mild regurgitation.  There were no vegetations or stenosis.  8. AORTIC VALVE:  The aortic valve is normal in structure and function with no regurgitation.  There were no vegetations or stenosis  9. TRICUSPID VALVE:  The tricuspid valve is normal in structure and function with trivial regurgitation.  There were no vegetations or stenosis  10.  PULMONIC VALVE:  The pulmonic valve is normal in structure and function with no regurgitation.  There were no vegetations or stenosis.   11. AORTIC ARCH, ASCENDING AND DESCENDING AORTA:  There was grade 1 Myrtis Ser et. Al, 1992) atherosclerosis of the proximal descending aorta.  12. PULMONARY VEINS: Anomalous pulmonary venous return was not noted.  13. PERICARDIUM: The pericardium appeared normal and non-thickened.  There is no pericardial effusion.  IMPRESSION:   1. LVEF 50-55% 2. Dilated LA with spontaneous echo contrast ("smoke") 3.   No LAA thrombus, however, low emptying velocity was noted 4.   No PFO  RECOMMENDATIONS:    1.  Dilated LA with moderate-sized appendage, spontaneous echo contrast ("smoke") - which could indicated recent thrombus or potential for thrombus development.  Low emptying velocity in the atrial appendage is abnormal. Findings are concerning for possible occult atrial arrythmia.  Further ambulatory monitoring for atrial arrhythmias is recommended.  Time Spent Directly with the Patient:  45 minutes   Chrystie Nose, MD, [facility] Attending Cardiologist [facility]  10/09/2013, 1:46 PM

## 2013-10-09 NOTE — Progress Notes (Signed)
Stroke Team Progress Note  HISTORY Chief Complaint: Stroke  HPI:  Jonathon Stevens is an 68 y.o. male with non significant PMHx other than gout. Patient was out in his yard selling a car when the buyer noted he was having difficulty expressing himself and had a right facial droop. EMS was called and on scene noted his right facial asymmetry and expressive aphasia. Pateint was brought to [facility] ED 10/07/2013 where CT head was negative but patient had persistent expressive aphasia. The discussion was made with his wife and tPA was started.  Date last known well: Date: 10/07/2013  Time last known well: Time: 08:30  tPA Given: Yes He was admitted to the neuro ICU for further evaluation and treatment.  SUBJECTIVE Wife at bedside. He is up walking in the halls. He is awaiting TEE and possible loop placement. He has multiple questions.  OBJECTIVE Most recent Vital Signs: Filed Vitals:   10/08/13 2016 10/09/13 0342 10/09/13 0648 10/09/13 0916  BP: 124/49 131/69 136/62 140/69  Pulse: 55 65 56 56  Temp: 97.5 F (36.4 C) 97.6 F (36.4 C) 97.9 F (36.6 C) 97.8 F (36.6 C)  TempSrc: Oral Oral Oral Oral  Resp: 18 18 18 20   Height:      Weight:      SpO2: 98% 98% 100% 95%   CBG (last 3)   Recent Labs  10/08/13 1722 10/08/13 2253 10/09/13 0652  GLUCAP 190* 133* 101*    IV Fluid Intake:   . sodium chloride 20 mL/hr at 10/08/13 2243    MEDICATIONS  . aspirin EC  325 mg Oral Daily  . insulin aspart  0-15 Units Subcutaneous TID WC  . insulin aspart  0-5 Units Subcutaneous QHS  . pantoprazole (PROTONIX) IV  40 mg Intravenous QHS   PRN:  acetaminophen, acetaminophen, labetalol, senna-docusate  Diet:  NPO  Activity:  As stolerated DVT Prophylaxis: SCDs   CLINICALLY SIGNIFICANT STUDIES Basic Metabolic Panel:   Recent Labs Lab 10/07/13 1222 10/07/13 1226  NA 138 138  K 4.3 4.0  CL 99 104  CO2 22  --   GLUCOSE 193* 188*  BUN 12 11  CREATININE 1.09 1.20  CALCIUM 9.3  --     Liver Function Tests:   Recent Labs Lab 10/07/13 1222  AST 18  ALT 18  ALKPHOS 83  BILITOT 0.4  PROT 7.1  ALBUMIN 3.8   CBC:   Recent Labs Lab 10/07/13 1222 10/07/13 1226  WBC 8.0  --   NEUTROABS 5.5  --   HGB 14.5 15.0  HCT 41.9 44.0  MCV 90.5  --   PLT 217  --    Coagulation:   Recent Labs Lab 10/07/13 1222  LABPROT 14.0  INR 1.08   Cardiac Enzymes: No results found for this basename: CKTOTAL, CKMB, CKMBINDEX, TROPONINI,  in the last 168 hours Urinalysis:   Recent Labs Lab 10/07/13 1400  COLORURINE YELLOW  LABSPEC 1.013  PHURINE 5.5  GLUCOSEU NEGATIVE  HGBUR NEGATIVE  BILIRUBINUR NEGATIVE  KETONESUR NEGATIVE  PROTEINUR NEGATIVE  UROBILINOGEN 0.2  NITRITE NEGATIVE  LEUKOCYTESUR NEGATIVE   Lipid Panel    Component Value Date/Time   CHOL 175 10/08/2013 0241   TRIG 114 10/08/2013 0241   HDL 41 10/08/2013 0241   CHOLHDL 4.3 10/08/2013 0241   VLDL 23 10/08/2013 0241   LDLCALC 111* 10/08/2013 0241   HgbA1C  Lab Results  Component Value Date   HGBA1C 6.5* 10/08/2013    Urine Drug Screen:  Component Value Date/Time   LABOPIA NONE DETECTED 10/07/2013 1400   COCAINSCRNUR NONE DETECTED 10/07/2013 1400   LABBENZ NONE DETECTED 10/07/2013 1400   AMPHETMU NONE DETECTED 10/07/2013 1400   THCU NONE DETECTED 10/07/2013 1400   LABBARB NONE DETECTED 10/07/2013 1400    Alcohol Level:   Recent Labs Lab 10/07/13 1222  ETH <11    Ct Head Wo Contrast  10/07/2013  IMPRESSION: No intracranial hemorrhage.  The left carotid terminus appears dense which may indicate thrombus potentially leading to left hemispheric infarct.  Remote small left cerebellar infarcts suspected rather than horizontal fissure.  Streak artifact through the pons.  Mild mucosal thickening left sphenoid sinus air cell and ethmoid sinus air cells.    Ct Angio Head & Neck W/cm &/or Wo/cm  10/07/2013    IMPRESSION: 1. Left MCA M2 branch 2 mm filling defect with M2 stenosis and possible  associated M3 branch occlusion. 2. Abundant soft plaque left carotid bifurcation. No hemodynamically significant left ICA stenosis. Incidental short-segment left ECA occlusion. 3. Mild-to-moderate extracranial atherosclerosis elsewhere. Intracranial CTA otherwise negative. 4. No CT changes of cortically based infarct, mass effect, or hemorrhage. Questionable early hypodensity left corona radiata.   MRI of the brain    10/08/2013   IMPRESSION: Four small foci of acute infarction, 1 in the right hemisphere and 3 in the left hemisphere, consistent with micro embolic infarctions. 1 tiny infarction is present in the right frontal lobe. Two tiny infarctions are present in the left frontal lobe. A 5 x 12 mm infarction is present at the left temporoparietal junction region. No hemorrhage or swelling.    MRA of the brain    10/08/2013    IMPRESSION: Four small foci of acute infarction, 1 in the right hemisphere and 3 in the left hemisphere, consistent with micro embolic infarctions. 1 tiny infarction is present in the right frontal lobe. Two tiny infarctions are present in the left frontal lobe. A 5 x 12 mm infarction is present at the left temporoparietal junction region. No hemorrhage or swelling.   Carotid Doppler  Cancelled, did CTA neck  2D Echocardiogram  EF 45-50% with no source of embolus. Mild hypokinesis of the apical myocardium.  EKG Sinus rhythm. For complete results please see formal report.   Dg Chest Port 1 View  10/07/2013   IMPRESSION: Borderline cardiomegaly.  No acute findings.     Therapy Recommendations no therapy needs  Physical Exam Blood pressure 140/69, pulse 56, temperature 97.8 F (36.6 C), temperature source Oral, resp. rate 20, height 5\' 8"  (1.727 m), weight 87.091 kg (192 lb), SpO2 95.00%. Gen: Patient is well developed, well nourished man in no acute distress.  Cardiac: RRR. S1S2 audible. No M/R/G.  Extremities: Cap refill <2 secs. No cyanosis or edema. Pulses 2+ radial  and DP. Pulmonary: Respirations regular, symmetric. Lungs clear to auscultation bilat. Abd: Soft, non-tender. BS audible x 4 quadrants.  G/U: Deferred  MS: Alert, follows commands. Oriented to person, place, time, and event. Speech: Speech fluent and non-dysarthric. Able to name and repeat. No alexia or agraphia.   CN: No visual field cut. PERRL. EOMs intact. Facial sensation intact V1-3. No facial droop. Hearing grossly intact. Strong cough. Sternocleidomastoids and trapezius 5/5 strength. Tongue midline, full strength, no atrophy or fasciculations.  Strength: 5/5 in all four extremities proximally and distally.  Sensation: Intact to light touch in all four extremities.  Coordination: No ataxia or dysmetria on FTN or HTS bilat. Gait steady.  Proprioception: Negative Romberg.  Reflexes: 2+ biceps, brachioradialis bilat. 2+ patellar, achilles bilat. No clonus. Downgoing toes bilat.  NIHSS 0  ASSESSMENT Mr. Willette ClusterGeorge Deike is a 68 y.o. male presenting with R facial droop and aphasia. Status post IV t-PA 10/07/13 at 1239. 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 Patient not on antithrombotics  prior to admission. Started on aspirin 325 mg daily. Patient with no resultant neuro deficits. Stroke work up underway.  Hypertension, Blood pressure variable with SBP 92-174, not on home medications Hyperlipidemia, LDL 111, on no statin PTA, now on no statin, goal LDL < 100 (< 70 for diabetics) Prediabetes. HgbA1c 6.5. Patient and wife educated on dietary adjustments. "illicit drug use once a week". UDS negative  Hospital day # 2  TREATMENT/PLAN  Continue aspirin 325 mg daily for secondary stroke prevention TEE to look for embolic source. Arranged with [facility] for today. If positive for PFO (patent foramen ovale), check bilateral lower extremity venous dopplers to rule out DVT as possible source of stroke.  If TEE  negative, [facility] electrophysiologist will consult and consider placement of an place implantable loop recorder to evaluate for atrial fibrillation as etiology of stroke. This has been explained to patient/family by Dr. Pearlean BrownieSethi and they are agreeable.   Add statin, lipitor 10 mg daily  Stop IV PPI  No therapy needs  SIGNED Annie MainSHARON BIBY, MSN, RN, ANVP-BC, ANP-BC, GNP-BC [facility] Pager: [phone] 10/09/2013 11:24 AM   I, the attending vascular neurologist, have personally obtained a history, examined the patient, evaluated laboratory data and imaging studies, and formulated the assessment and plan of care.  I have made any additions or clarifications directly to the above note and agree with the findings and plan as currently documented.  Delia HeadyPramod Jette Lewan, MD   To contact Stroke Continuity provider, please refer to [email or web address]. After hours, contact General Neurology

## 2013-10-09 NOTE — Progress Notes (Signed)
  Echocardiogram Echocardiogram Transesophageal has been performed.  Jonathon Stevens 10/09/2013, 2:40 PM

## 2013-10-09 NOTE — Consult Note (Signed)
CARDIOLOGY CONSULT NOTE   Patient ID: Jonathon Stevens MRN: [id number] DOB/AGE: [birth year] 68 y.o.  Admit date: 10/07/2013  Primary Physician   Delorse Lek, MD Primary Cardiologist   New/ Eldridge Dace Reason for Consultation  Atrial fibrillation with RVR  HPI: Jonathon Stevens is a 68 y.o. male with a history of tobacco abuse, gout, and no prior cardiac history who presented to [facility] on 10/08/13 with with acute CVA with symptoms of sudden onset expressive aphasia and right facial droop. During his admission he was noted to be in atrial fibrillation with RVR and cardiology was consulted.   Jonathon Stevens was out in his yard selling a car when the buyer noted he was having difficulty expressing himself and had a right facial droop. EMS was called and on scene noted his right facial asymmetry and expressive aphasia. Pateint was brought to [facility] ED where CT head was negative but he  had persistent expressive aphasia so TPA was initiated. The patient has fully recovered and has no resultant neuro deficits. Imaging now confirms small bilateral infarcts consistent with micro embolic infarctions. He underwent a TTE 10/08/13 on which revealed an EF 45-50%, mild hypokinesis of apical myocardium. Mild LA dilation and mild RA dilation. However, TEE the following day revealed a LVEF 50-55% and normal wall motion. It did, however, show a dilated LA with spontaneous echo contrast ("smoke") and no PFO. There was no LAA thrombus, however, a low emptying velocity was noted. These findings were felt to indicate possible recent thrombus or potential for thrombus development. Further ambulatory monitoring for atrial arrhythmias was recommended; however, the patient was noted to go into atrial fibrillation with RVR on telemetry  after his TEE today and cardiology consulted. The patient is unaware of his dysrhythmia.   The patient works as a Community education officer in [place] and is very physically active at his work as well as in  the yard. He denies exertional chest pain or shortness of breath. He reports mild SOB when he runs up and down the stairs several times back to back, but otherwise has no problems. He has no past cardiac history and has never seen a cardiologist. He has never had a stress test and is not routinely followed by primary care provider. He does see a doctor in [place] who helps control his gout as needed. He eats pretty healthfully and drinks alcohol occasionally. He denies illicit drug use. He has smoked half a pack of cigarettes a day for the past 15 years. He denies chest pain, palpitations, weakness, lightheadedness/ dizziness, orthopnea, PND, lower extremity edema. He denies a family history of heart disease. No recent fevers, chills, night sweats or weight loss. No blood in stool or urine. He denies a history of diabetes, hypertension, hyperlipidemia or CHF.      Past Medical History  Diagnosis Date  . Gout      History reviewed. No pertinent past surgical history.  No Known Allergies  I have reviewed the patient's current medications . aspirin EC  325 mg Oral Daily  . atorvastatin  10 mg Oral q1800  . insulin aspart  0-15 Units Subcutaneous TID WC  . insulin aspart  0-5 Units Subcutaneous QHS     acetaminophen, acetaminophen, labetalol, senna-docusate  Prior to Admission medications   Medication Sig Start Date End Date Taking? Authorizing Provider  diclofenac (VOLTAREN) 75 MG EC tablet Take 75 mg by mouth 2 (two) times daily.   Yes Historical Provider, MD  Multiple Vitamin (MULTIVITAMIN  WITH MINERALS) TABS tablet Take 1 tablet by mouth daily.   Yes Historical Provider, MD     History   Social History  . Marital Status: Unknown    Spouse Name: N/A    Number of Children: N/A  . Years of Education: N/A   Occupational History  . Not on file.   Social History Main Topics  . Smoking status: Former Smoker -- 0.50 packs/day for 15 years    Types: Cigarettes    Quit date:  10/07/2013  . Smokeless tobacco: Not on file  . Alcohol Use: No  . Drug Use: No  . Sexual Activity: Not on file   Other Topics Concern  . Not on file   Social History Narrative  . No narrative on file    No family status information on file.   Family History  Problem Relation Age of Onset  . Hypertension Mother   . Hypertension Father      ROS:  Full 14 point review of systems complete and found to be negative unless listed above.  Physical Exam: Blood pressure 143/89, pulse 72, temperature 98.5 F (36.9 C), temperature source Oral, resp. rate 18, height 5\' 8"  (1.727 m), weight 192 lb (87.091 kg), SpO2 97.00%.  General: Well developed, well nourished, male in no acute distress Head: Eyes PERRLA, No xanthomas.   Normocephalic and atraumatic, oropharynx without edema or exudate. Dentition:  Lungs: CTAB Heart: irreg irreg. S1 S2, no rub/gallop, Neck: No carotid bruits. No lymphadenopathy.  No JVD. Abdomen: Bowel sounds present, abdomen soft and non-tender without masses or hernias noted. Msk:  No spine or cva tenderness. No weakness, no joint deformities or effusions. Extremities: No clubbing or cyanosis.  edema.  Neuro: Alert and oriented X 3. No focal deficits noted. Psych:  Good affect, responds appropriately Skin: No rashes or lesions noted.  Labs:   Lab Results  Component Value Date   WBC 8.0 10/07/2013   HGB 15.0 10/07/2013   HCT 44.0 10/07/2013   MCV 90.5 10/07/2013   PLT 217 10/07/2013    Recent Labs  10/07/13 1222  INR 1.08    Recent Labs Lab 10/07/13 1222 10/07/13 1226  NA 138 138  K 4.3 4.0  CL 99 104  CO2 22  --   BUN 12 11  CREATININE 1.09 1.20  CALCIUM 9.3  --   PROT 7.1  --   BILITOT 0.4  --   ALKPHOS 83  --   ALT 18  --   AST 18  --   GLUCOSE 193* 188*  ALBUMIN 3.8  --     Recent Labs  10/07/13 1229  TROPIPOC 0.02    Lab Results  Component Value Date   CHOL 175 10/08/2013   HDL 41 10/08/2013   LDLCALC 111* 10/08/2013   TRIG 114  10/08/2013    Echo: TTE 10/08/2013 LV EF: 45% - 50% Study Conclusions - Left ventricle: The cavity size was normal. Systolic function was mildly reduced. The estimated ejection fraction was in the range of 45% to 50%. Mild hypokinesis of the apical myocardium. - Left atrium: The atrium was mildly dilated. - Right atrium: The atrium was mildly dilated.  TEE 10/09/13 IMPRESSION:  1. LVEF 50-55% 2. Dilated LA with spontaneous echo contrast ("smoke") 3. No LAA thrombus, however, low emptying velocity was noted  4. No PFO  .  ECG:  Atrial fibrillation with RVR  Radiology:  Jonathon Brain Wo Contrast  10/08/2013   CLINICAL DATA:  Code  stroke yesterday.  TPA administration.  EXAM: MRI HEAD WITHOUT CONTRAST  MRA HEAD WITHOUT CONTRAST  TECHNIQUE: Multiplanar, multiecho pulse sequences of the brain and surrounding structures were obtained without intravenous contrast. Angiographic images of the head were obtained using MRA technique without contrast.  COMPARISON:  CT 10/07/2013  FINDINGS: MRI HEAD FINDINGS  There are several scattered foci of acute infarction. There is a 5 mm focus in the right hemisphere at the frontal cortex. The left hemisphere, there are 2 nearby a foci affecting the frontal cortex, each about 4 mm in size. There is a 5 x 12 mm focus subacute infarction at the temporoparietal junction on the left. Findings are consistent with embolic infarctions. No large vessel territory infarction. No mass lesion, hemorrhage, hydrocephalus or extra-axial collection. There are chronic small-vessel ischemic changes throughout the pons. No cerebellar insult. Minor old small vessel ischemic changes affect the thalami and the hemispheric white matter. No hydrocephalus or extra-axial collection. No pituitary mass. No inflammatory sinus disease.  MRA HEAD FINDINGS  Both internal carotid arteries are widely patent into the brain. The anterior and middle cerebral vessels appear normal without proximal stenosis,  aneurysm or vascular malformation. No evidence of residual filling defect in the left MCA.  Both vertebral arteries are widely patent to the basilar. No basilar stenosis. Posterior circulation branch vessels appear patent. There is mild atherosclerotic irregularity in the more distal branch vessels.  IMPRESSION: Four small foci of acute infarction, 1 in the right hemisphere and 3 in the left hemisphere, consistent with micro embolic infarctions. 1 tiny infarction is present in the right frontal lobe. Two tiny infarctions are present in the left frontal lobe. A 5 x 12 mm infarction is present at the left temporoparietal junction region. No hemorrhage or swelling.  Jonathon angiography does not show any major vessel occlusion. There is no longer any filling defect evident in the left MCA, as seen on CTA yesterday. Distal vessels do show some atherosclerotic irregularity.   Electronically Signed   By: Paulina Fusi M.D.   On: 10/08/2013 13:54   Dg Chest Port 1 View  10/07/2013   CLINICAL DATA:  Stroke.  EXAM: PORTABLE CHEST - 1 VIEW  COMPARISON:  None.  FINDINGS: The heart size appears upper normal for portable technique. Pulmonary vascularity is within normal limits. The lungs are clear. The visualized skeletal structures are unremarkable.  IMPRESSION: Borderline cardiomegaly.  No acute findings.   Electronically Signed   By: Britta Mccreedy M.D.   On: 10/07/2013 22:06   Jonathon Maxine Glenn Head/brain Wo Cm  10/08/2013   CLINICAL DATA:  Code stroke yesterday.  TPA administration.  EXAM: MRI HEAD WITHOUT CONTRAST  MRA HEAD WITHOUT CONTRAST  TECHNIQUE: Multiplanar, multiecho pulse sequences of the brain and surrounding structures were obtained without intravenous contrast. Angiographic images of the head were obtained using MRA technique without contrast.  COMPARISON:  CT 10/07/2013  FINDINGS: MRI HEAD FINDINGS  There are several scattered foci of acute infarction. There is a 5 mm focus in the right hemisphere at the frontal cortex.  The left hemisphere, there are 2 nearby a foci affecting the frontal cortex, each about 4 mm in size. There is a 5 x 12 mm focus subacute infarction at the temporoparietal junction on the left. Findings are consistent with embolic infarctions. No large vessel territory infarction. No mass lesion, hemorrhage, hydrocephalus or extra-axial collection. There are chronic small-vessel ischemic changes throughout the pons. No cerebellar insult. Minor old small vessel ischemic changes affect the  thalami and the hemispheric white matter. No hydrocephalus or extra-axial collection. No pituitary mass. No inflammatory sinus disease.  MRA HEAD FINDINGS  Both internal carotid arteries are widely patent into the brain. The anterior and middle cerebral vessels appear normal without proximal stenosis, aneurysm or vascular malformation. No evidence of residual filling defect in the left MCA.  Both vertebral arteries are widely patent to the basilar. No basilar stenosis. Posterior circulation branch vessels appear patent. There is mild atherosclerotic irregularity in the more distal branch vessels.  IMPRESSION: Four small foci of acute infarction, 1 in the right hemisphere and 3 in the left hemisphere, consistent with micro embolic infarctions. 1 tiny infarction is present in the right frontal lobe. Two tiny infarctions are present in the left frontal lobe. A 5 x 12 mm infarction is present at the left temporoparietal junction region. No hemorrhage or swelling.  Jonathon angiography does not show any major vessel occlusion. There is no longer any filling defect evident in the left MCA, as seen on CTA yesterday. Distal vessels do show some atherosclerotic irregularity.   Electronically Signed   By: Paulina FusiMark  Shogry M.D.   On: 10/08/2013 13:54    ASSESSMENT AND PLAN:    Active Problems:   Stroke   Willette ClusterGeorge Reiling is a 68 y.o. male with a history of tobacco abuse, gout, and no prior cardiac history who [facility] with with  acute CVA with symptoms of sudden onset expressive aphasia and right facial droop. During his admission he was noted to be in atrial fibrillation with RVR and cardiology was consulted.   Atrial flutter with RVR- HRs in 160s. Better controlled ~110 after 1 dose of IV Lopressor 2.5mg   -- This is suspected to be the cause of his stroke. He will require long-term anticoagulation as his CHADS2 score is at least 4. (HTN, DM, CVA). He would qualify for a NOAC as he has normal renal function. This will need to be cleared by neurology. -- Will start low dose of Lopressor. 12.5mg  BID. Do not want to lower to aggressively in the setting of recent stroke.  Acute CVA- s/p IV t-PA 10/07/13 at 1239 -- Imaging confirms small bilateral infarcts consistent with micro embolic infarctions -- Patient not on antithrombotics prior to admission. Started on aspirin 325 mg daily -- Patient with no resultant neuro deficits. -- Patient noted to be in Afib, which is the presumed source.   Cardiomyopathy-  -- TTE 10/08/13: EF 45-50%, mild hypokinesis of apical myocardium. Mild LA dilation, mild RA dilation. However, TEE with LVEF 50-55% and normal wall motion. -- TEE 10/09/13: LVEF 50-55%. Dilated LA with spontaneous echo contrast ("smoke"). No LAA thrombus, however, low emptying velocity was noted. No PFO. These findings were felt to indicate possible recent thrombus or potential for thrombus development. Findings are concerning for possible occult atrial arrythmia. Further ambulatory monitoring for atrial arrhythmias was recommended; however, the patient was noted to go into atrial fibrillation with RVR on telemetry and cardiology consulted.  -- Patient may need a LHC with newly reduced EF  DM- HgA1c 6.5, which is on the border of pre-diabtetes and diabetes. Patient and wife educated on dietary adjustments. May need medication  Hypertension, Blood pressure variable with SBP 92-174, not on home medications. 12.5mg  BID. Do not  want to lower to aggressively in the setting of recent stroke.  Hyperlipidemia- LDL 111, on no statin PTA, now on Lipitor 10mg .    SignedThereasa Parkin: STERN, KATHRYN, PA-C 10/09/2013 3:29 PM  Pager [phone]  Co-Sign MD  I have examined the patient and reviewed assessment and plan and discussed with patient.  Agree with above as stated.  Rhythm appears to be atrial flutter with variable block.  Agree with longterm anticoagulation for stroke prevention.  Start low dose metoprolol.  Bilateral cerebral infarct c/w cardiac source.  Patient asymptomatic with the atrial arrhythmia.  Difficult to know how much flutter he may be having.  After a month of anticoagulation, would consider whether he would be a candidate for ablation.  Would only consider if anticoagulation  Would eliminate need for anticoagulation.  I think NOAC would be good when safe from a neuro standpoint.  Chae Oommen S.

## 2013-10-09 NOTE — Progress Notes (Signed)
Occupational Therapy Evaluation Patient Details Name: Jonathon Stevens Waterhouse MRN: [id number] DOB: 15-Sep-1945 Today's Date: 10/09/2013    History of Present Illness Jonathon Stevens Ruminski is an 68 y.o. male with non significant PMHx other than gout. Patient was out in his yard selling a car when the buyer noted he was having difficulty expressing himself and had a right facial droop. EMS was called and on scene noted his right facial asymmetry and expressive aphasia. Pateint was [facility] ED where CT head was negative but patient had persistent expressive aphasia. The discussion was made with his wife and tPA was started.     Clinical Impression   PTA pt lived at home and was independent with ADLs and functional mobility. Pt currently back to baseline and has no apparent expressive language deficits. Educated pt and wife on signs and symptoms of stroke using FAST with pt/family teach back. Acute OT to sign off.     Follow Up Recommendations  No OT follow up    Equipment Recommendations  None recommended by OT       Precautions / Restrictions Precautions Precautions: None Restrictions Weight Bearing Restrictions: No      Mobility Bed Mobility Overal bed mobility: Independent                Transfers Overall transfer level: Independent Equipment used: None                  Balance Overall balance assessment: No apparent balance deficits (not formally assessed)                                          ADL Overall ADL's : Independent;At baseline                                       General ADL Comments: Pt reports all symptoms have resolved. Educated pt and wife on signs and symptoms of stroke using F.A.S.T and pt/wife teach back. Educated pt on stress management and coping techniques such as deep breathing. Pt expressed fear of stroke occurring in his sleep and discussed what to do if/when symptoms emerge.      Vision Eye Alignment:  Within Functional Limits Alignment/Gaze Preference: Within Defined Limits Ocular Range of Motion: Within Functional Limits Tracking/Visual Pursuits: Able to track stimulus in all quads without difficulty Saccades: Within functional limits Convergence: Within functional limits         Perception Perception Perception Tested?: No   Praxis Praxis Praxis tested?: Within functional limits    Pertinent Vitals/Pain NAD     Hand Dominance Right   Extremity/Trunk Assessment Upper Extremity Assessment Upper Extremity Assessment: Overall WFL for tasks assessed   Lower Extremity Assessment Lower Extremity Assessment: Overall WFL for tasks assessed   Cervical / Trunk Assessment Cervical / Trunk Assessment: Normal   Communication Communication Communication: No difficulties   Cognition Arousal/Alertness: Awake/alert Behavior During Therapy: WFL for tasks assessed/performed Overall Cognitive Status: Within Functional Limits for tasks assessed                                Home Living Family/patient expects to be discharged to:: Private residence Living Arrangements: Spouse/significant other Available Help at Discharge: Family;Available PRN/intermittently Type of Home: House Home Access:  Stairs to enter [employer] of Steps: 3 Entrance Stairs-Rails: None Home Layout: One level               Home Equipment: None          Prior Functioning/Environment Level of Independence: Independent        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

## 2013-10-10 ENCOUNTER — Encounter (HOSPITAL_COMMUNITY): Payer: Self-pay | Admitting: Internal Medicine

## 2013-10-10 DIAGNOSIS — R7303 Prediabetes: Secondary | ICD-10-CM | POA: Diagnosis present

## 2013-10-10 DIAGNOSIS — I4891 Unspecified atrial fibrillation: Secondary | ICD-10-CM

## 2013-10-10 LAB — GLUCOSE, CAPILLARY: GLUCOSE-CAPILLARY: 106 mg/dL — AB (ref 70–99)

## 2013-10-10 MED ORDER — RIVAROXABAN 20 MG PO TABS
20.0000 mg | ORAL_TABLET | Freq: Every day | ORAL | Status: DC
  Administered 2013-10-10: 20 mg via ORAL
  Filled 2013-10-10: qty 1

## 2013-10-10 MED ORDER — LIVING WELL WITH DIABETES BOOK
Freq: Once | Status: AC
  Administered 2013-10-10: 1
  Filled 2013-10-10: qty 1

## 2013-10-10 MED ORDER — RIVAROXABAN 20 MG PO TABS: 20.0000 mg | ORAL_TABLET | Freq: Every day | ORAL | Status: DC

## 2013-10-10 MED ORDER — ATORVASTATIN CALCIUM 10 MG PO TABS: 10.0000 mg | ORAL_TABLET | Freq: Every day | ORAL | Status: DC

## 2013-10-10 MED ORDER — METOPROLOL TARTRATE 25 MG PO TABS: 12.5000 mg | ORAL_TABLET | Freq: Two times a day (BID) | ORAL | Status: DC

## 2013-10-10 NOTE — Progress Notes (Signed)
Patient to be discharged with wife. His telemetry and IV have been removed and he has received his stroke education and his diabetes education.  He has signed his EMMI form and it was placed in the appropriate place.  Lance Bosch, RN

## 2013-10-10 NOTE — Discharge Summary (Signed)
Stroke Discharge Summary  Patient ID: Gail Creekmore   MRN: [id number]      DOB: 05-23-1945  Date of Admission: 10/07/2013 Date of Discharge: 10/10/2013  Attending Physician:  Delia Heady, MD, Stroke MD  Consulting Physician(s):   Treatment Team:  Md Stroke, MD, Dr. 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 Paroxsymal    Prediabetes   Hypertension   Hyperlipidemia  BMI: Body mass index is 29.2 kg/(m^2).  Past Medical History  Diagnosis Date  . Gout   . LV dysfunction     a. ECHO 10/08/13: EF 45-50%, mild hypokinesis of apical myocardium. Mild LA dilation, mild RA dilation  . HLD (hyperlipidemia)   . Atrial fibrillation   . CVA (cerebral infarction)   . Atrial flutter    Past Surgical History  Procedure Laterality Date  . Tee without cardioversion N/A 10/09/2013    Procedure: TRANSESOPHAGEAL ECHOCARDIOGRAM (TEE);  Surgeon: Chrystie Nose, MD;  Location: [facility] ENDOSCOPY;  Service: Cardiovascular;  Laterality: N/A;      Medication List         atorvastatin 10 MG tablet  Commonly known as:  LIPITOR  Take 1 tablet (10 mg total) by mouth daily at 6 PM.     diclofenac 75 MG EC tablet  Commonly known as:  VOLTAREN  Take 75 mg by mouth 2 (two) times daily.     metoprolol tartrate 25 MG tablet  Commonly known as:  LOPRESSOR  Take 0.5 tablets (12.5 mg total) by mouth 2 (two) times daily.     multivitamin with minerals Tabs tablet  Take 1 tablet by mouth daily.     rivaroxaban 20 MG Tabs tablet  Commonly known as:  XARELTO  Take 1 tablet (20 mg total) by mouth daily.        LABORATORY STUDIES CBC    Component Value Date/Time   WBC 8.0 10/07/2013 1222   RBC 4.63 10/07/2013 1222   HGB 15.0 10/07/2013 1226   HCT 44.0 10/07/2013 1226   PLT 217  10/07/2013 1222   MCV 90.5 10/07/2013 1222   MCH 31.3 10/07/2013 1222   MCHC 34.6 10/07/2013 1222   RDW 12.8 10/07/2013 1222   LYMPHSABS 1.8 10/07/2013 1222   MONOABS 0.4 10/07/2013 1222   EOSABS 0.3 10/07/2013 1222   BASOSABS 0.0 10/07/2013 1222   CMP    Component Value Date/Time   NA 138 10/07/2013 1226   K 4.0 10/07/2013 1226   CL 104 10/07/2013 1226   CO2 22 10/07/2013 1222   GLUCOSE 188* 10/07/2013 1226   BUN 11 10/07/2013 1226   CREATININE 1.20 10/07/2013 1226   CALCIUM 9.3 10/07/2013 1222   PROT 7.1 10/07/2013 1222   ALBUMIN 3.8 10/07/2013 1222   AST 18 10/07/2013 1222   ALT 18 10/07/2013 1222   ALKPHOS 83 10/07/2013 1222   BILITOT 0.4 10/07/2013 1222   GFRNONAA 68* 10/07/2013 1222   GFRAA 79* 10/07/2013 1222   COAGS Lab Results  Component Value Date   INR 1.08 10/07/2013   Lipid Panel    Component Value Date/Time   CHOL 175 10/08/2013 0241   TRIG 114 10/08/2013 0241   HDL 41 10/08/2013 0241   CHOLHDL 4.3 10/08/2013 0241   VLDL 23 10/08/2013 0241   LDLCALC 111* 10/08/2013 0241  HgbA1C  Lab Results  Component Value Date   HGBA1C 6.5* 10/08/2013   Cardiac Panel (last 3 results) No results found for this basename: CKTOTAL, CKMB, TROPONINI, RELINDX,  in the last 72 hours Urinalysis    Component Value Date/Time   COLORURINE YELLOW 10/07/2013 1400   APPEARANCEUR CLEAR 10/07/2013 1400   LABSPEC 1.013 10/07/2013 1400   PHURINE 5.5 10/07/2013 1400   GLUCOSEU NEGATIVE 10/07/2013 1400   HGBUR NEGATIVE 10/07/2013 1400   BILIRUBINUR NEGATIVE 10/07/2013 1400   KETONESUR NEGATIVE 10/07/2013 1400   PROTEINUR NEGATIVE 10/07/2013 1400   UROBILINOGEN 0.2 10/07/2013 1400   NITRITE NEGATIVE 10/07/2013 1400   LEUKOCYTESUR NEGATIVE 10/07/2013 1400   Urine Drug Screen     Component Value Date/Time   LABOPIA NONE DETECTED 10/07/2013 1400   COCAINSCRNUR NONE DETECTED 10/07/2013 1400   LABBENZ NONE DETECTED 10/07/2013 1400   AMPHETMU NONE DETECTED 10/07/2013 1400   THCU NONE DETECTED 10/07/2013 1400   LABBARB  NONE DETECTED 10/07/2013 1400    Alcohol Level    Component Value Date/Time   ETH <11 10/07/2013 1222     SIGNIFICANT DIAGNOSTIC STUDIES Ct Head Wo Contrast  10/07/2013 IMPRESSION: No intracranial hemorrhage. The left carotid terminus appears dense which may indicate thrombus potentially leading to left hemispheric infarct. Remote small left cerebellar infarcts suspected rather than horizontal fissure. Streak artifact through the pons. Mild mucosal thickening left sphenoid sinus air cell and ethmoid sinus air cells.  Ct Angio Head & Neck W/cm &/or Wo/cm  10/07/2013 IMPRESSION: 1. Left MCA M2 branch 2 mm filling defect with M2 stenosis and possible associated M3 branch occlusion. 2. Abundant soft plaque left carotid bifurcation. No hemodynamically significant left ICA stenosis. Incidental short-segment left ECA occlusion. 3. Mild-to-moderate extracranial atherosclerosis elsewhere. Intracranial CTA otherwise negative. 4. No CT changes of cortically based infarct, mass effect, or hemorrhage. Questionable early hypodensity left corona radiata.  MRI of the brain  10/08/2013 IMPRESSION: Four small foci of acute infarction, 1 in the right hemisphere and 3 in the left hemisphere, consistent with micro embolic infarctions. 1 tiny infarction is present in the right frontal lobe. Two tiny infarctions are present in the left frontal lobe. A 5 x 12 mm infarction is present at the left temporoparietal junction region. No hemorrhage or swelling.  MRA of the brain  10/08/2013 IMPRESSION: Four small foci of acute infarction, 1 in the right hemisphere and 3 in the left hemisphere, consistent with micro embolic infarctions. 1 tiny infarction is present in the right frontal lobe. Two tiny infarctions are present in the left frontal lobe. A 5 x 12 mm infarction is present at the left temporoparietal junction region. No hemorrhage or swelling.  Carotid Doppler Cancelled, did CTA neck  2D Echocardiogram EF 45-50% with no  source of embolus. Mild hypokinesis of the apical myocardium.  TEE 10/09/2013  LVEF 50-55%. Dilated LA with spontaneous echo contrast ("smoke"). No LAA thrombus, however, low emptying velocity was noted. No PFO EKG Sinus rhythm. For complete results please see formal report.  Dg Chest Port 1 View  10/07/2013 IMPRESSION: Borderline cardiomegaly. No acute findings.      History of Present Illness   Willette ClusterGeorge Sapia is an 68 y.o. male with non significant PMHx other than gout. Patient was out in his yard 10/07/2013 at 0830 selling a car when the buyer noted he was having difficulty expressing himself and had a right facial droop. EMS was called and on scene noted his right facial asymmetry and expressive aphasia. Pateint was  brought to [facility] ED 10/07/2013 where CT head was negative but patient had persistent expressive aphasia. The discussion was made with his wife and tPA was started. He was considered for intervention, but there was no treatable lesion. He was admitted to the neuro ICU for further evaluation and treatment.  Hospital Course   Bilateral Embolic Ischemic Infarcts TEE to rule out embolic source Developed atrial flutter post TEE On no antithrombotics prior to admission, initially started on aspirin, changed to xarelto at discharge  No therapy needs at d/c  Paroxsymal atrial flutter  Converted to NSR with HR 60s the next day Started on lopressor CHA2DS2-VASc score is 5 OK to stop aspirin per cardiology once Xarelto started.   Hypertension Blood pressure variable with SBP 92-174 not on home medications  Stable at time of discharge  Hyperlipidemia LDL 111, started on lipitor  goal LDL < 100 (< 70 for diabetics)   Pre-Diabetes HgbAqc 6.5. At the cusp of diabetes diagnosis.  Diet and lifestyle change discussed with pt and wife by MD and Diabetes Coordinator.  defer CBG monitoring plan and further treatment to primary MD  "illicit drug use once a week". UDS negative   Discharge  Exam  Blood pressure 115/62, pulse 58, temperature 97.9 F (36.6 C), temperature source Oral, resp. rate 18, height 5\' 8"  (1.727 m), weight 87.091 kg (192 lb), SpO2 100.00%.  Gen: Patient is well developed, well nourished man in no acute distress.  Cardiac: RRR. S1S2 audible. No M/R/G.  Extremities: Cap refill <2 secs. No cyanosis or edema. Pulses 2+ radial and DP.  Pulmonary: Respirations regular, symmetric. Lungs clear to auscultation bilat.  Abd: Soft, non-tender. BS audible x 4 quadrants.  G/U: Deferred  MS: Alert, follows commands. Oriented to person, place, time, and event.  Speech: Speech fluent and non-dysarthric. Able to name and repeat. No alexia or agraphia.  CN: No visual field cut. PERRL. EOMs intact. Facial sensation intact V1-3. No facial droop. Hearing grossly intact. Strong cough. Sternocleidomastoids and trapezius 5/5 strength. Tongue midline, full strength, no atrophy or fasciculations.  Strength: 5/5 in all four extremities proximally and distally.  Sensation: Intact to light touch in all four extremities.  Coordination: No ataxia or dysmetria on FTN or HTS bilat. Gait steady.  Proprioception: Negative Romberg.  Reflexes: 2+ biceps, brachioradialis bilat. 2+ patellar, achilles bilat. No clonus. Downgoing toes bilat.  NIHSS 0  Discharge Diet   Cardiac thin liquids  Discharge Plan    Disposition:  Home with wife   xarelto ( rivaroxaban) for secondary stroke prevention.  Ongoing risk factor control by Primary Care Physician.  Follow-up BURNETT,BRENT A, MD in 2 weeks.  followup cardiology in 2 weeks  Follow-up with Dr. Roda Shutters, Stroke Clinic in 2 months.  45 minutes were spent preparing discharge.  Annie Main, MSN, RN, ANVP-BC, ANP-BC, [facility] Pager: [phone] 10/10/2013 4:39 PM  Signed I have personally examined this patient, reviewed notes, independently viewed imaging studies, participated in medical decision making and plan of care.  I have made any additions or clarifications directly to the above note. Agree with note above.    Delia Heady, MD Medical Director [facility] Pager: [phone] 10/10/2013 5:11 PM

## 2013-10-10 NOTE — Progress Notes (Signed)
Spoke with patient and wife about the new diagnosis of diabetes.  His HgbA1C is 6.5%, so he is right on the line of diagnosis.  Recommended that he get a home blood glucose meter and check CBGs at home twice a day, changing times of the day to get an overall picture of his glucose control. He needs to keep a record and take to his PCP. Explained importance of glucose control, HgbA1C levels, and ordered dietician to speak with him about meal planning.  Will need to follow up with PCP for diabetes control.  Ordered Living Well with Diabetes booklet from pharmacy.  Will continue to follow while in hospital.  Smith Mince RN BSN CDE

## 2013-10-10 NOTE — Care Management Note (Addendum)
    Page 1 of 1   10/10/2013     4:00:40 PM CARE MANAGEMENT NOTE 10/10/2013  Patient:  Jonathon Stevens, Jonathon Stevens   Account Number:  [id number]  Date Initiated:  10/10/2013  Documentation initiated by:  Elmer Bales  Subjective/Objective Assessment:   Patient was admitted with CVA. Lives at home with spouse.     Action/Plan:   will follow for discharge needs pending PT/OT evals and physician orders   Anticipated DC Date:     Anticipated DC Plan:  HOME/SELF CARE         Choice offered to / List presented to:             Status of service:  In process, will continue to follow Medicare Important Message given?  YES (If response is "NO", the following Medicare IM given date fields will be blank) Date Medicare IM given:  10/10/2013 Medicare IM given by:  Elmer Bales Date Additional Medicare IM given:   Additional Medicare IM given by:    Discharge Disposition:    Per UR Regulation:  Reviewed for med. necessity/level of care/duration of stay  If discussed at Long Length of Stay Meetings, dates discussed:    Comments:  10/10/13 1545 Elmer Bales RN, MSN, CM- Benefits check for Xarelto was recieved after patient was discharged.  CM spoke with [facility] pharmacy and provided 30 day free card information via phone.  CM spoke with patient's wife to inform her that the information has been relayed to the pharmacy. Patient is aware that after prior authorization is completed, his copay will be $45 per month.  10/10/13 4268 Elmer Bales RN, MSN, CM- Provided Medicare IM letter.

## 2013-10-10 NOTE — Progress Notes (Signed)
Dietician in room visiting with patient and wife discussing eating plan to help control his prediabetes.  Living with diabetes book was given to dietician and she was going to go over it with the patient and wife.  Lance Bosch, RN

## 2013-10-10 NOTE — Progress Notes (Signed)
Patient Profile: Jonathon Stevens is a 68 y.o. male with a history of tobacco abuse, HTN, DM, gout, and no prior cardiac history who [facility] with with acute CVA with symptoms of sudden onset expressive aphasia and right facial droop. [facility]int [facility] where CT head was negative but he had persistent expressive aphasia so TPA was initiated. The patient has fully recovered and has no resultant neuro deficits. MRI confirms small bilateral infarcts consistent with micro embolic infarctions. He underwent a TTE on 10/08/13  which revealed an EF 45-50%, mild hypokinesis of apical myocardium. Mild LA dilation and mild RA dilation. However, TEE the following day revealed a LVEF of 50-55% and normal wall motion. It did, however, show a dilated LA with spontaneous echo contrast ("smoke") and no PFO. There was no LAA thrombus, however, a low emptying velocity was noted. These findings were felt to indicate possible recent thrombus or potential for thrombus development. Further ambulatory monitoring for atrial arrhythmias was recommended; however, the patient was noted to go into atrial fibrillation/flutter with RVR on telemetry after his TEE on 10/10/13.   Subjective: No complaints. He had no awareness of his afib yesterday. He denies CP/SOB. No residual neurological defects from his stroke.   Objective: Vital signs in last 24 hours: Temp:  [97.6 F (36.4 C)-98.7 F (37.1 C)] 98.1 F (36.7 C) (07/17 1024) Pulse Rate:  [44-88] 61 (07/17 1024) Resp:  [9-20] 18 (07/17 1024) BP: (111-196)/(55-89) 111/60 mmHg (07/17 1024) SpO2:  [95 %-100 %] 97 % (07/17 1024) Last BM Date: 10/07/13  Intake/Output from previous day: 07/16 0701 - 07/17 0700 In: 300 [I.V.:300] Out: -  Intake/Output this shift:    Medications Current Facility-Administered Medications  Medication Dose Route Frequency Provider Last Rate Last Dose  . acetaminophen (TYLENOL) tablet 650 mg  650 mg Oral Q4H PRN  Ulice Dashavid R Smith, PA-C       Or  . acetaminophen (TYLENOL) suppository 650 mg  650 mg Rectal Q4H PRN Ulice Dashavid R Smith, PA-C      . aspirin EC tablet 325 mg  325 mg Oral Daily Thana FarrLeslie Reynolds, MD   325 mg at 10/10/13 1013  . atorvastatin (LIPITOR) tablet 10 mg  10 mg Oral q1800 Layne BentonSharon L Biby, NP   10 mg at 10/09/13 1720  . insulin aspart (novoLOG) injection 0-15 Units  0-15 Units Subcutaneous TID WC Ritta SlotMcNeill Kirkpatrick, MD   8 Units at 10/09/13 1719  . insulin aspart (novoLOG) injection 0-5 Units  0-5 Units Subcutaneous QHS Ritta SlotMcNeill Kirkpatrick, MD      . labetalol (NORMODYNE,TRANDATE) injection 10 mg  10 mg Intravenous Q10 min PRN Ulice Dashavid R Smith, PA-C      . living well with diabetes book MISC   Does not apply Once Delia HeadyPramod Sethi, MD      . metoprolol (LOPRESSOR) injection 2.5 mg  2.5 mg Intravenous Q5 min PRN Layne BentonSharon L Biby, NP   2.5 mg at 10/09/13 1544  . metoprolol tartrate (LOPRESSOR) tablet 12.5 mg  12.5 mg Oral BID Thereasa ParkinKathryn Stern, PA-C   12.5 mg at 10/10/13 1013  . senna-docusate (Senokot-S) tablet 1 tablet  1 tablet Oral QHS PRN Ulice Dashavid R Smith, PA-C        PE: General appearance: alert, cooperative and no distress Neck: no carotid bruit and no JVD Lungs: clear to auscultation bilaterally Heart: regular rate and rhythm, S1, S2 normal, no murmur, click, rub or gallop Extremities: no LEE Pulses: 2+ and symmetric Skin: warm  and dry Neurologic: Grossly normal  Lab Results:  No results found for this basename: WBC, HGB, HCT, PLT,  in the last 72 hours BMET No results found for this basename: NA, K, CL, CO2, GLUCOSE, BUN, CREATININE, CALCIUM,  in the last 72 hours PT/INR No results found for this basename: LABPROT, INR,  in the last 72 hours Cholesterol  Recent Labs  10/08/13 0241  CHOL 175    Assessment/Plan  Active Problems:   Stroke   Atrial flutter  1. Paroxsymal Atrial Flutter: felt to be the cause of his CVA. He is now back in NSR with HR in the 60s. His CHA2DS2-VASc score is 5  (CVA, HTN, DM, Age). He will require long term oral anticoagulation. Will need to check with neurology to see if ok to start today. Renal function is normal. Recommend NOAC. Given h/o of ischemic stroke, Pradaxa may be the better choice. He denies any PMH of PUD, GIB or falls. If cleared by neurology to start NOAC today, will need to discontinue ASA use to minimize risk for bleeding. Continue metoprolol for rate control.     LOS: 3 days    Brittainy M. Delmer Islam 10/10/2013 12:44 PM  Patient seen and examined. Agree with assessment and plan. Discussed with Dr. Pearlean Brownie. Pt is for dc today. Excellent neurologic recovery. PAF noted on ECG yesteray. With CHA2DS2-VASc score will need NOAC. Dr. Pearlean Brownie will be sending him home on Xarelto for once daily dosing. Continue BB, f/u cardiology in 2 weeks.   Lennette Bihari, MD, [facility] 10/10/2013 1:45 PM

## 2013-10-10 NOTE — Plan of Care (Signed)
Problem: Food- and Nutrition-Related Knowledge Deficit (NB-1.1) Goal: Nutrition education Formal process to instruct or train a patient/client in a skill or to impart knowledge to help patients/clients voluntarily manage or modify food choices and eating behavior to maintain or improve health. Outcome: Completed/Met Date Met:  10/10/13  RD consulted for nutrition education regarding diabetes. Patient with new diabetes/pre-diabetes. Patient will not be discharged with any diabetes medications at this time, but will manage with diet and exercise.    Lab Results  Component Value Date    HGBA1C 6.5* 10/08/2013    RD provided "Carbohydrate Counting for People with Diabetes" handout from the Academy of Nutrition and Dietetics. Discussed different food groups and their effects on blood sugar, emphasizing carbohydrate-containing foods. Provided list of carbohydrates and recommended serving sizes of common foods.  Discussed importance of controlled and consistent carbohydrate intake throughout the day. Provided examples of ways to balance meals/snacks and encouraged intake of high-fiber, whole grain complex carbohydrates. Teach back method used.  Also, briefly discussed heart healthy diet, including limiting foods high in fat and sodium, and choosing fruits, vegetables, and whole grains more frequently.   Patient given contact information for outpatient diabetes education.   Expect good compliance.  Body mass index is 29.2 kg/(m^2). Pt meets criteria for overweight based on current BMI.  Current diet order is Heart Healthy, patient is consuming approximately 100% of meals at this time. Labs and medications reviewed. No further nutrition interventions warranted at this time. RD contact information provided. If additional nutrition issues arise, please re-consult RD.  Larey Seat, RD, LDN Pager #: [phone] After-Hours Pager #: [phone]

## 2013-10-23 ENCOUNTER — Encounter: Payer: Self-pay | Admitting: *Deleted

## 2013-10-27 ENCOUNTER — Ambulatory Visit (INDEPENDENT_AMBULATORY_CARE_PROVIDER_SITE_OTHER): Payer: Medicare Other | Admitting: Physician Assistant

## 2013-10-27 ENCOUNTER — Encounter: Payer: Self-pay | Admitting: Physician Assistant

## 2013-10-27 VITALS — BP 130/78 | HR 60 | Ht 70.0 in | Wt 184.0 lb

## 2013-10-27 DIAGNOSIS — E785 Hyperlipidemia, unspecified: Secondary | ICD-10-CM

## 2013-10-27 DIAGNOSIS — I6340 Cerebral infarction due to embolism of unspecified cerebral artery: Secondary | ICD-10-CM

## 2013-10-27 DIAGNOSIS — I4892 Unspecified atrial flutter: Secondary | ICD-10-CM

## 2013-10-27 NOTE — Progress Notes (Signed)
Cardiology Office Note    Date:  10/27/2013   ID:  Jonathon Stevens, DOB 09/08/1945, MRN [id number]  PCP:  Delorse LekBURNETT,BRENT A, MD  Cardiologist:  Dr. Everette RankJay Varanasi       History of Present Illness: Jonathon Stevens is a 68 y.o. male who was admitted 7/14-7/17 with an embolic CVA. He did receive TPA with resolution of symptoms. He developed atrial flutter with RVR while hospitalized. He converted to NSR. He was placed on Xarelto for anticoagulation.  He returns for f/u.  He is here with his wife.  The patient denies chest pain, shortness of breath, syncope, orthopnea, PND or significant pedal edema. He does feel tired.  Sees neurology later this month.    Studies:  - Echo (10/08/13):  EF 45-50%, apical HK, mild BAE  - TEE (10/09/13):  EF 50-55%, normal wall motion, no LAA clot   Recent Labs/Images: 10/07/2013: ALT 18; Creatinine 1.20; Hemoglobin 15.0; Potassium 4.0  10/08/2013: HDL Cholesterol by NMR 41; LDL (calc) 111*    Wt Readings from Last 3 Encounters:  10/27/13 184 lb (83.462 kg)  10/07/13 192 lb (87.091 kg)  10/07/13 192 lb (87.091 kg)     Past Medical History  Diagnosis Date  . Gout   . Hx of echocardiogram     a. ECHO 10/08/13: EF 45-50%, mild hypokinesis of apical myocardium. Mild LA dilation, mild RA dilation;  b.  TEE (10/09/13):  EF 50-55%, normal wall motion, no LAA clot  . HLD (hyperlipidemia)   . Atrial flutter   . CVA (cerebral infarction)     embolic CVA 09/2013    Current Outpatient Prescriptions  Medication Sig Dispense Refill  . allopurinol (ZYLOPRIM) 100 MG tablet Take 100 mg by mouth daily.      Marland Kitchen. atorvastatin (LIPITOR) 10 MG tablet Take 1 tablet (10 mg total) by mouth daily at 6 PM.  30 tablet  2  . metoprolol tartrate (LOPRESSOR) 25 MG tablet Take 0.5 tablets (12.5 mg total) by mouth 2 (two) times daily.  30 tablet  2  . rivaroxaban (XARELTO) 20 MG TABS tablet Take 1 tablet (20 mg total) by mouth daily.  30 tablet  2   No current facility-administered  medications for this visit.     Allergies:   Review of patient's allergies indicates no known allergies.   Social History:  The patient  reports that he quit smoking about 2 weeks ago. His smoking use included Cigarettes. He has a 7.5 pack-year smoking history. He does not have any smokeless tobacco history on file. He reports that he does not drink alcohol or use illicit drugs.   Family History:  The patient's family history includes Hypertension in his father and mother.   ROS:  Please see the history of present illness.   No bleeding problems.   All other systems reviewed and negative.   PHYSICAL EXAM: VS:  BP 130/78  Pulse 60  Ht 5\' 10"  (1.778 m)  Wt 184 lb (83.462 kg)  BMI 26.40 kg/m2 Well nourished, well developed, in no acute distress HEENT: normal Neck: no JVD Cardiac:  normal S1, S2; RRR; no murmur Lungs:  clear to auscultation bilaterally, no wheezing, rhonchi or rales Abd: soft, nontender, no hepatomegaly Ext: no edema Skin: warm and dry Neuro:  CNs 2-12 intact, no focal abnormalities noted  EKG:  NSR, HR 60, normal axis     ASSESSMENT AND PLAN:  Atrial flutter, unspecified:  Maintaining NSR. Continue Lopressor. Continue Xarelto. Patient does have fatigue.  If this continues, consider changing Lopressor to Cardizem.  Consideration could be given toward referral to EP for possible ablation. However, I am not certain that this would change his need for anticoagulation.  Cerebral embolism with cerebral infarction:  Follow up with neurology. Continue Xarelto, statin.  HLD (hyperlipidemia):  Continue statin.  This will be managed by primary care.    Disposition:  F/u Dr. Everette Rank in 3 mos.   Signed, Brynda Rim, MHS 10/27/2013 3:22 PM    [facility] [address] [place], [place], [place]  [address] Phone: [phone]; Fax: [phone]

## 2013-10-27 NOTE — Patient Instructions (Signed)
Your physician recommends that you schedule a follow-up appointment in: 3 MONTHS WITH DR. [facility]  Your physician recommends that you continue on your current medications as directed. Please refer to the Current Medication list given to you today.

## 2013-11-10 ENCOUNTER — Other Ambulatory Visit: Payer: Self-pay

## 2013-11-10 MED ORDER — RIVAROXABAN 20 MG PO TABS: 20.0000 mg | ORAL_TABLET | Freq: Every day | ORAL | Status: DC

## 2013-11-13 ENCOUNTER — Telehealth: Payer: Self-pay | Admitting: Cardiology

## 2013-11-13 ENCOUNTER — Encounter: Payer: Self-pay | Admitting: Cardiology

## 2013-11-13 NOTE — Telephone Encounter (Signed)
PA for Xarelto Approved through 11/14/14. Lm for pharmacy to make aware.

## 2013-11-19 ENCOUNTER — Encounter: Payer: Self-pay | Admitting: Neurology

## 2013-11-19 ENCOUNTER — Ambulatory Visit (INDEPENDENT_AMBULATORY_CARE_PROVIDER_SITE_OTHER): Payer: Medicare Other | Admitting: Neurology

## 2013-11-19 VITALS — BP 130/72 | HR 56 | Ht 69.0 in | Wt 188.4 lb

## 2013-11-19 DIAGNOSIS — E785 Hyperlipidemia, unspecified: Secondary | ICD-10-CM

## 2013-11-19 DIAGNOSIS — I639 Cerebral infarction, unspecified: Secondary | ICD-10-CM

## 2013-11-19 DIAGNOSIS — I6350 Cerebral infarction due to unspecified occlusion or stenosis of unspecified cerebral artery: Secondary | ICD-10-CM

## 2013-11-19 DIAGNOSIS — I4892 Unspecified atrial flutter: Secondary | ICD-10-CM

## 2013-11-19 NOTE — Patient Instructions (Signed)
-   continue Xarelto, no missing doses - continue lipitor for stroke prevention - check BP at home and record, bring over to PCP - follow up with PCP as scheduled for stroke risk factor modification - follow up in 3 months.

## 2013-11-19 NOTE — Progress Notes (Signed)
STROKE NEUROLOGY FOLLOW UP NOTE  NAME: Jonathon Stevens DOB: 12-13-1945  REASON FOR VISIT: stroke follow up HISTORY FROM: pt and chart  Today we had the pleasure of seeing Jonathon Stevens in follow-up at our Neurology Clinic. Pt was accompanied by wife.   History Summary Jonathon Stevens is an 68 y.o. male with non significant PMHx other than gout was admitted in 09/2013 for right side weakness and aphasia. CT head was negative and he received tPA. His symptoms quickly improved. 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 He was later found to have A flutter post TEE which lasted more than 24 hours and then converted to NSR the next day. Due to high CHA2DS2-VASc score of 5, he was started on Xarelto and lopressor. LDL 111. He was discharged home with Xarelto and lipitor for stroke prevention.   Interval History During the interval time, the patient has been doing well. No residue deficit of stroke. He takes medication everyday without missing doses.  Initially feels tired but now back to his baseline. No complains in clinic.  REVIEW OF SYSTEMS: Full 14 system review of systems performed and notable only for those listed below and in HPI above, all others are negative:  Constitutional: N/A  Cardiovascular: N/A  Ear/Nose/Throat: N/A  Skin: N/A  Eyes: N/A  Respiratory: N/A  Gastroitestinal: N/A  Genitourinary: N/A Hematology/Lymphatic: N/A  Endocrine: N/A  Musculoskeletal: N/A  Allergy/Immunology: N/A  Neurological: N/A  Psychiatric: N/A  The following represents the patient's updated allergies and side effects list: No Known Allergies  Labs since last visit of relevance include the following: Results for orders placed during the hospital encounter of 10/07/13  MRSA PCR SCREENING      Result Value Ref Range   MRSA by PCR NEGATIVE  NEGATIVE  ETHANOL      Result Value Ref Range   Alcohol, Ethyl (B) <11  0 - 11 mg/dL  PROTIME-INR   Result Value Ref Range   Prothrombin Time 14.0  11.6 - 15.2 seconds   INR 1.08  0.00 - 1.49  APTT      Result Value Ref Range   aPTT 29  24 - 37 seconds  CBC      Result Value Ref Range   WBC 8.0  4.0 - 10.5 K/uL   RBC 4.63  4.22 - 5.81 MIL/uL   Hemoglobin 14.5  13.0 - 17.0 g/dL   HCT 16.1  09.6 - 04.5 %   MCV 90.5  78.0 - 100.0 fL   MCH 31.3  26.0 - 34.0 pg   MCHC 34.6  30.0 - 36.0 g/dL   RDW 40.9  81.1 - 91.4 %   Platelets 217  150 - 400 K/uL  DIFFERENTIAL      Result Value Ref Range   Neutrophils Relative % 68  43 - 77 %   Neutro Abs 5.5  1.7 - 7.7 K/uL   Lymphocytes Relative 22  12 - 46 %   Lymphs Abs 1.8  0.7 - 4.0 K/uL   Monocytes Relative 5  3 - 12 %   Monocytes Absolute 0.4  0.1 - 1.0 K/uL   Eosinophils Relative 4  0 - 5 %   Eosinophils Absolute 0.3  0.0 - 0.7 K/uL   Basophils Relative 1  0 - 1 %   Basophils Absolute 0.0  0.0 - 0.1 K/uL  COMPREHENSIVE METABOLIC PANEL      Result Value Ref Range   Sodium 138  137 - 147 mEq/L   Potassium 4.3  3.7 - 5.3 mEq/L   Chloride 99  96 - 112 mEq/L   CO2 22  19 - 32 mEq/L   Glucose, Bld 193 (*) 70 - 99 mg/dL   BUN 12  6 - 23 mg/dL   Creatinine, Ser 1.61  0.50 - 1.35 mg/dL   Calcium 9.3  8.4 - 09.6 mg/dL   Total Protein 7.1  6.0 - 8.3 g/dL   Albumin 3.8  3.5 - 5.2 g/dL   AST 18  0 - 37 U/L   ALT 18  0 - 53 U/L   Alkaline Phosphatase 83  39 - 117 U/L   Total Bilirubin 0.4  0.3 - 1.2 mg/dL   GFR calc non Af Amer 68 (*) >90 mL/min   GFR calc Af Amer 79 (*) >90 mL/min   Anion gap 17 (*) 5 - 15  URINE RAPID DRUG SCREEN (HOSP PERFORMED)      Result Value Ref Range   Opiates NONE DETECTED  NONE DETECTED   Cocaine NONE DETECTED  NONE DETECTED   Benzodiazepines NONE DETECTED  NONE DETECTED   Amphetamines NONE DETECTED  NONE DETECTED   Tetrahydrocannabinol NONE DETECTED  NONE DETECTED   Barbiturates NONE DETECTED  NONE DETECTED  URINALYSIS, ROUTINE W REFLEX MICROSCOPIC      Result Value Ref Range   Color, Urine YELLOW   YELLOW   APPearance CLEAR  CLEAR   Specific Gravity, Urine 1.013  1.005 - 1.030   pH 5.5  5.0 - 8.0   Glucose, UA NEGATIVE  NEGATIVE mg/dL   Hgb urine dipstick NEGATIVE  NEGATIVE   Bilirubin Urine NEGATIVE  NEGATIVE   Ketones, ur NEGATIVE  NEGATIVE mg/dL   Protein, ur NEGATIVE  NEGATIVE mg/dL   Urobilinogen, UA 0.2  0.0 - 1.0 mg/dL   Nitrite NEGATIVE  NEGATIVE   Leukocytes, UA NEGATIVE  NEGATIVE  HEMOGLOBIN A1C      Result Value Ref Range   Hemoglobin A1C 6.5 (*) <5.7 %   Mean Plasma Glucose 140 (*) <117 mg/dL  GLUCOSE, CAPILLARY      Result Value Ref Range   Glucose-Capillary 131 (*) 70 - 99 mg/dL   Comment 1 Notify RN     Comment 2 Documented in Chart    LIPID PANEL      Result Value Ref Range   Cholesterol 175  0 - 200 mg/dL   Triglycerides 045  <409 mg/dL   HDL 41  >81 mg/dL   Total CHOL/HDL Ratio 4.3     VLDL 23  0 - 40 mg/dL   LDL Cholesterol 191 (*) 0 - 99 mg/dL  GLUCOSE, CAPILLARY      Result Value Ref Range   Glucose-Capillary 102 (*) 70 - 99 mg/dL  GLUCOSE, CAPILLARY      Result Value Ref Range   Glucose-Capillary 106 (*) 70 - 99 mg/dL  GLUCOSE, CAPILLARY      Result Value Ref Range   Glucose-Capillary 190 (*) 70 - 99 mg/dL   Comment 1 Notify RN     Comment 2 Documented in Chart    GLUCOSE, CAPILLARY      Result Value Ref Range   Glucose-Capillary 133 (*) 70 - 99 mg/dL   Comment 1 Notify RN     Comment 2 Documented in Chart    GLUCOSE, CAPILLARY      Result Value Ref Range   Glucose-Capillary 101 (*) 70 - 99 mg/dL   Comment  1 Documented in Chart     Comment 2 Notify RN    GLUCOSE, CAPILLARY      Result Value Ref Range   Glucose-Capillary 128 (*) 70 - 99 mg/dL   Comment 1 Notify RN     Comment 2 Documented in Chart    GLUCOSE, CAPILLARY      Result Value Ref Range   Glucose-Capillary 265 (*) 70 - 99 mg/dL  GLUCOSE, CAPILLARY      Result Value Ref Range   Glucose-Capillary 127 (*) 70 - 99 mg/dL   Comment 1 Documented in Chart     Comment 2 Notify  RN    GLUCOSE, CAPILLARY      Result Value Ref Range   Glucose-Capillary 106 (*) 70 - 99 mg/dL  I-STAT CHEM 8, ED      Result Value Ref Range   Sodium 138  137 - 147 mEq/L   Potassium 4.0  3.7 - 5.3 mEq/L   Chloride 104  96 - 112 mEq/L   BUN 11  6 - 23 mg/dL   Creatinine, Ser 3.35  0.50 - 1.35 mg/dL   Glucose, Bld 456 (*) 70 - 99 mg/dL   Calcium, Ion 2.56 (*) 1.13 - 1.30 mmol/L   TCO2 21  0 - 100 mmol/L   Hemoglobin 15.0  13.0 - 17.0 g/dL   HCT 38.9  37.3 - 42.8 %  I-STAT TROPOININ, ED      Result Value Ref Range   Troponin i, poc 0.02  0.00 - 0.08 ng/mL   Comment 3           CBG MONITORING, ED      Result Value Ref Range   Glucose-Capillary 150 (*) 70 - 99 mg/dL  CBG MONITORING, ED      Result Value Ref Range   Glucose-Capillary 103 (*) 70 - 99 mg/dL   Comment 1 Notify RN      The neurologically relevant items on the patient's problem list were reviewed on today's visit.  Neurologic Examination  A problem focused neurological exam (12 or more points of the single system neurologic examination, vital signs counts as 1 point, cranial nerves count for 8 points) was performed.  Blood pressure 130/72, pulse 56, height 5\' 9"  (1.753 m), weight 188 lb 6.4 oz (85.458 kg).  General - Well nourished, well developed, in no apparent distress.  Ophthalmologic - not able to see through.  Cardiovascular - Regular rate and rhythm with no murmur.  Mental Status -  Level of arousal and orientation to time, place, and person were intact. Language including expression, naming, repetition, comprehension, reading, and writing was assessed and found intact.  Cranial Nerves II - XII - II - Visual field intact OU. III, IV, VI - Extraocular movements intact. V - Facial sensation intact bilaterally. VII - Facial movement intact bilaterally. VIII - Hearing & vestibular intact bilaterally. X - Palate elevates symmetrically. XI - Chin turning & shoulder shrug intact bilaterally. XII - Tongue  protrusion intact.  Motor Strength - The patient's strength was normal in all extremities and pronator drift was absent.  Bulk was normal and fasciculations were absent.   Motor Tone - Muscle tone was assessed at the neck and appendages and was normal.  Reflexes - The patient's reflexes were normal in all extremities and he had no pathological reflexes.  Sensory - Light touch, temperature/pinprick, vibration and proprioception, and Romberg testing were assessed and were normal.    Coordination - The patient had  normal movements in the hands and feet with no ataxia or dysmetria.  Tremor was absent.  Gait and Station - The patient's transfers, posture, gait, station, and turns were observed as normal.  Data reviewed: I personally reviewed the images and agree with the radiology interpretations.  Ct Head Wo Contrast  10/07/2013 IMPRESSION: No intracranial hemorrhage. The left carotid terminus appears dense which may indicate thrombus potentially leading to left hemispheric infarct. Remote small left cerebellar infarcts suspected rather than horizontal fissure. Streak artifact through the pons. Mild mucosal thickening left sphenoid sinus air cell and ethmoid sinus air cells.  Ct Angio Head & Neck W/cm &/or Wo/cm  10/07/2013 IMPRESSION: 1. Left MCA M2 branch 2 mm filling defect with M2 stenosis and possible associated M3 branch occlusion. 2. Abundant soft plaque left carotid bifurcation. No hemodynamically significant left ICA stenosis. Incidental short-segment left ECA occlusion. 3. Mild-to-moderate extracranial atherosclerosis elsewhere. Intracranial CTA otherwise negative. 4. No CT changes of cortically based infarct, mass effect, or hemorrhage. Questionable early hypodensity left corona radiata.  MRI and MRA of the brain  10/08/2013 IMPRESSION: Four small foci of acute infarction, 1 in the right hemisphere and 3 in the left hemisphere, consistent with micro embolic infarctions. 1 tiny infarction is  present in the right frontal lobe. Two tiny infarctions are present in the left frontal lobe. A 5 x 12 mm infarction is present at the left temporoparietal junction region. No hemorrhage or swelling.  2D Echocardiogram EF 45-50% with no source of embolus. Mild hypokinesis of the apical myocardium.  TEE 10/09/2013 LVEF 50-55%. Dilated LA with spontaneous echo contrast ("smoke"). No LAA thrombus, however, low emptying velocity was noted. No PFO  EKG Sinus rhythm. For complete results please see formal report.  Dg Chest Port 1 View  10/07/2013 IMPRESSION: Borderline cardiomegaly. No acute findings.   Assessment: As you may recall, he is a 68 y.o. Caucasian male with PMH of gout on PRN steroids was admitted in 09/2013 for aphasia and right sided weakness. tPA given. Symptoms resolved quickly. MRI showed cardioembolic stroke and post TEE he had A flutter lasting more than 24 hours. Discharged on Xarelto and lipitor for stroke prevention. After discharge, he is doing well.  Plan:  - continue Xarelto and lipitor for stroke prevention - check BP at home  - follow up with PCP for stroke risk factor modification - RTC in 3 months.  No orders of the defined types were placed in this encounter.    Patient Instructions  - continue Xarelto, no missing doses - continue lipitor for stroke prevention - check BP at home and record, bring over to PCP - follow up with PCP as scheduled for stroke risk factor modification - follow up in 3 months.    Marvel Plan, MD PhD [facility] [address] [place], [place] [address] [phone]

## 2014-01-06 ENCOUNTER — Other Ambulatory Visit: Payer: Self-pay | Admitting: *Deleted

## 2014-01-06 MED ORDER — ATORVASTATIN CALCIUM 10 MG PO TABS: 10.0000 mg | ORAL_TABLET | Freq: Every day | ORAL | Status: DC

## 2014-01-06 MED ORDER — METOPROLOL TARTRATE 25 MG PO TABS: 12.5000 mg | ORAL_TABLET | Freq: Two times a day (BID) | ORAL | Status: DC

## 2014-01-19 ENCOUNTER — Telehealth: Payer: Self-pay | Admitting: Interventional Cardiology

## 2014-01-19 NOTE — Telephone Encounter (Signed)
Patient states he needs a tooth extracted. Per Weston Brass Pharm D, he does not need to stop Xarelto for this.

## 2014-01-19 NOTE — Telephone Encounter (Signed)
New message      Pt is on xeralto.  He needs a tooth extracted.  Will he need to stop xeralto prior to extraction?

## 2014-02-02 ENCOUNTER — Ambulatory Visit (INDEPENDENT_AMBULATORY_CARE_PROVIDER_SITE_OTHER): Payer: Medicare Other | Admitting: Interventional Cardiology

## 2014-02-02 ENCOUNTER — Encounter: Payer: Self-pay | Admitting: Interventional Cardiology

## 2014-02-02 VITALS — BP 120/92 | HR 162 | Ht 69.0 in | Wt 194.1 lb

## 2014-02-02 DIAGNOSIS — E785 Hyperlipidemia, unspecified: Secondary | ICD-10-CM

## 2014-02-02 DIAGNOSIS — I6340 Cerebral infarction due to embolism of unspecified cerebral artery: Secondary | ICD-10-CM

## 2014-02-02 DIAGNOSIS — R9431 Abnormal electrocardiogram [ECG] [EKG]: Secondary | ICD-10-CM

## 2014-02-02 DIAGNOSIS — I4892 Unspecified atrial flutter: Secondary | ICD-10-CM

## 2014-02-02 NOTE — Progress Notes (Signed)
Patient ID: Jonathon Stevens Canizalez, male   DOB: 1945-07-08, 68 y.o.   MRN: [id number]    [address] [place], [place]  [id number] Phone: [phone] Fax:  [phone]  Date:  02/02/2014   ID:  Jonathon Stevens Donna, DOB 1945-07-08, MRN [id number]  PCP:  Delorse LekBURNETT,BRENT A, MD      History of Present Illness: Jonathon Stevens Swallows is a 68 y.o. male who had a CVA.  He had AFib dagnosed in the hospital at that time.  He was started on anticoagulation with Xarelto at that time. He has done well since then. He has made 1 visit to the office since his hospitalization. He does get very nervous coming in the doctor's office. He denies any palpitations or chest discomfort. Much of the neurologic symptoms that he had at the time of his CVA have resolved. He recently saw his primary care doctor who noticed an irregular heartbeat.  Of note, there is a question of a mildly decreased left ventricular ejection fraction. The TEE on the following day in the hospital showed normal left ventricular ejection fraction.   Wt Readings from Last 3 Encounters:  02/02/14 194 lb 1.9 oz (88.052 kg)  11/19/13 188 lb 6.4 oz (85.458 kg)  10/27/13 184 lb (83.462 kg)     Past Medical History  Diagnosis Date  . Gout   . Hx of echocardiogram     a. ECHO 10/08/13: EF 45-50%, mild hypokinesis of apical myocardium. Mild LA dilation, mild RA dilation;  b.  TEE (10/09/13):  EF 50-55%, normal wall motion, no LAA clot  . HLD (hyperlipidemia)   . Atrial flutter   . CVA (cerebral infarction)     embolic CVA 09/2013    Current Outpatient Prescriptions  Medication Sig Dispense Refill  . allopurinol (ZYLOPRIM) 100 MG tablet     . atorvastatin (LIPITOR) 10 MG tablet Take 1 tablet (10 mg total) by mouth daily at 6 PM. 30 tablet 2  . metoprolol tartrate (LOPRESSOR) 25 MG tablet Take 0.5 tablets (12.5 mg total) by mouth 2 (two) times daily. 30 tablet 2  . Multiple Vitamin (MULTIVITAMIN) tablet Take 1 tablet by mouth daily.    .  rivaroxaban (XARELTO) 20 MG TABS tablet Take 1 tablet (20 mg total) by mouth daily. 90 tablet 2  . predniSONE (DELTASONE) 20 MG tablet Take 20 mg by mouth as needed.     No current facility-administered medications for this visit.    Allergies:   No Known Allergies  Social History:  The patient  reports that he quit smoking about 3 months ago. His smoking use included Cigarettes. He has a 7.5 pack-year smoking history. He does not have any smokeless tobacco history on file. He reports that he does not drink alcohol or use illicit drugs.   Family History:  The patient's family history includes Hypertension in his father and mother.   ROS:  Please see the history of present illness.  No nausea, vomiting.  No fevers, chills.  No focal weakness.  No dysuria.    All other systems reviewed and negative.   PHYSICAL EXAM: VS:  BP 120/92 mmHg  Pulse 162  Ht 5\' 9"  (1.753 m)  Wt 194 lb 1.9 oz (88.052 kg)  BMI 28.65 kg/m2 General: Well developed, well nourished, in no acute distress HEENT: normal Neck: no JVD, no carotid bruits Cardiac:  normal S1, S2; tachycardic;  Lungs:  clear to auscultation bilaterally, no wheezing, rhonchi or rales Abd: soft, nontender, no hepatomegaly  Ext: no edema Skin: warm and dry Neuro:   no focal abnormalities noted Psych: normal affect  EKG:  Atrial flutter with variable block  ASSESSMENT AND PLAN:  1. Atrial fibrillation/atrial flutter: He has not had his medications today. Heart rate elevated and is in flutter by ECG. He will check his blood pressure and heart rate with a machine he has at home. We also went over how to check his pulse. If he feels elevated heart rates, he'll let us know.  He is to call with HR from his machine.  I explained him that one of the risks of a prolonged tachycardia would be a cardiomyopathy. At last visit, ECG showed heart rate in the low 60s.  He will take his metoprolol today and see what his HR does.  Will increase dose if needed.   I encouraged him to take his Xarelto at night since dinner is the biggest meal of the day for him. He is agreeable to this change.  I stressed the importance of this medication to help prevent stroke, especially in the setting of him having recurrent atrial arrhythmias.  Now that deductible is paid, cost of Xarelto is better. 2. LDL was 111 in the hospital. He was started on a low-dose statin.  He will need a lipid check in the next few weeks.  This can be done with his primary care doctor. LDL target is less than 100. 3. Question of cardiomyopathy: He has not had any signs or symptoms of ischemia. Given that the repeat echocardiogram showed improved left ventricular ejection fraction, will not pursue ischemia evaluation at this time.  Signed, Fredric Mare, MD, [facility] 02/02/2014 9:41 AM

## 2014-02-02 NOTE — Patient Instructions (Signed)
Your physician recommends that you schedule a follow-up appointment in: 3-4 months with Dr Eldridge Dace  Your physician recommends that you continue on your current medications as directed. Please refer to the Current Medication list given to you today.   Keep a log of your heart rates and bring with you to your next appontment  Thank you for choosing [facility]!!     Dennis Bast, RN [phone]

## 2014-02-16 ENCOUNTER — Encounter: Payer: Self-pay | Admitting: Nurse Practitioner

## 2014-02-16 ENCOUNTER — Ambulatory Visit (INDEPENDENT_AMBULATORY_CARE_PROVIDER_SITE_OTHER): Payer: Medicare Other | Admitting: Nurse Practitioner

## 2014-02-16 VITALS — BP 125/52 | HR 61 | Ht 70.0 in | Wt 195.0 lb

## 2014-02-16 DIAGNOSIS — I639 Cerebral infarction, unspecified: Secondary | ICD-10-CM

## 2014-02-16 NOTE — Patient Instructions (Signed)
Patient Instructions  - continue Xarelto, no missing doses - continue lipitor for stroke prevention - check BP at home and record, bring over to PCP - follow up with PCP as scheduled for stroke risk factor modification - follow up in 6 months with Dr. Roda Stevens months.    Stroke Prevention Some medical conditions and behaviors are associated with an increased chance of having a stroke. You may prevent a stroke by making healthy choices and managing medical conditions. HOW CAN I REDUCE MY RISK OF HAVING A STROKE?   Stay physically active. Get at least 30 minutes of activity on most or all days.  Do not smoke. It may also be helpful to avoid exposure to secondhand smoke.  Limit alcohol use. Moderate alcohol use is considered to be:  No more than 2 drinks per day for men.  No more than 1 drink per day for nonpregnant women.  Eat healthy foods. This involves:  Eating 5 or more servings of fruits and vegetables a day.  Making dietary changes that address high blood pressure (hypertension), high cholesterol, diabetes, or obesity.  Manage your cholesterol levels.  Making food choices that are high in fiber and low in saturated fat, trans fat, and cholesterol may control cholesterol levels.  Take any prescribed medicines to control cholesterol as directed by your health care provider.  Manage your diabetes.  Controlling your carbohydrate and sugar intake is recommended to manage diabetes.  Take any prescribed medicines to control diabetes as directed by your health care provider.  Control your hypertension.  Making food choices that are low in salt (sodium), saturated fat, trans fat, and cholesterol is recommended to manage hypertension.  Take any prescribed medicines to control hypertension as directed by your health care provider.  Maintain a healthy weight.  Reducing calorie intake and making food choices that are low in sodium, saturated fat, trans fat, and cholesterol are  recommended to manage weight.  Stop drug abuse.  Avoid taking birth control pills.  Talk to your health care provider about the risks of taking birth control pills if you are over 73 years old, smoke, get migraines, or have ever had a blood clot.  Get evaluated for sleep disorders (sleep apnea).  Talk to your health care provider about getting a sleep evaluation if you snore a lot or have excessive sleepiness.  Take medicines only as directed by your health care provider.  For some people, aspirin or blood thinners (anticoagulants) are helpful in reducing the risk of forming abnormal blood clots that can lead to stroke. If you have the irregular heart rhythm of atrial fibrillation, you should be on a blood thinner unless there is a good reason you cannot take them.  Understand all your medicine instructions.  Make sure that other conditions (such as anemia or atherosclerosis) are addressed. SEEK IMMEDIATE MEDICAL CARE IF:   You have sudden weakness or numbness of the face, arm, or leg, especially on one side of the body.  Your face or eyelid droops to one side.  You have sudden confusion.  You have trouble speaking (aphasia) or understanding.  You have sudden trouble seeing in one or both eyes.  You have sudden trouble walking.  You have dizziness.  You have a loss of balance or coordination.  You have a sudden, severe headache with no known cause.  You have new chest pain or an irregular heartbeat. Any of these symptoms may represent a serious problem that is an emergency. Do not wait to  see if the symptoms will go away. Get medical help at once. Call your local emergency services (911 in [place] Do not drive yourself to the hospital. Document Released: 04/20/2004 Document Revised: 07/28/2013 Document Reviewed: 09/13/2012 [facility] Patient Information 2015 [employer] [place]. This information is not intended to replace advice given to you by your health care provider. Make sure  you discuss any questions you have with your health care provider.

## 2014-02-16 NOTE — Progress Notes (Signed)
STROKE NEUROLOGY FOLLOW UP NOTE  NAME: Jonathon Stevens DOB: Mar 16, 1946  REASON FOR VISIT: stroke follow up HISTORY FROM: pt and chart  Today we had the pleasure of seeing Jonathon Stevens in follow-up at our Neurology Clinic. Pt was unaccompanied.  History Summary Jonathon Stevens is an 68 y.o. male with non significant PMHx other than gout was admitted in 09/2013 for right side weakness and aphasia. CT head was negative and he received tPA. His symptoms quickly improved. 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 He was later found to have A flutter post TEE which lasted more than 24 hours and then converted to NSR the next day. Due to high CHA2DS2-VASc score of 5, he was started on Xarelto and lopressor. LDL 111. He was discharged home with Xarelto and lipitor for stroke prevention.   Interval History During the interval time, the patient has been doing well. No residual deficit of stroke. He takes medication everyday without missing doses. Initially feels tired but now back to his baseline. Blood pressure is well controlled, it is 125/52 in the office today.  He is tolerating Xarelto well with no signs of significant bleeding or bruising.  No complaints in clinic.  REVIEW OF SYSTEMS: Full 14 system review of systems performed and notable only for those listed below and in HPI above, all others are negative:  Constitutional: N/A  Cardiovascular: N/A  Ear/Nose/Throat: N/A  Skin: N/A  Eyes: N/A  Respiratory: N/A  Gastroitestinal: N/A  Genitourinary: N/A Hematology/Lymphatic: N/A  Endocrine: N/A  Musculoskeletal: N/A  Allergy/Immunology: N/A  Neurological: N/A  Psychiatric: N/A  ALLERGIES: No Known Allergies  HOME MEDICATIONS: Outpatient Prescriptions Prior to Visit  Medication Sig Dispense Refill  . allopurinol (ZYLOPRIM) 100 MG tablet     . atorvastatin (LIPITOR) 10 MG tablet Take 1 tablet (10 mg total) by mouth  daily at 6 PM. 30 tablet 2  . metoprolol tartrate (LOPRESSOR) 25 MG tablet Take 0.5 tablets (12.5 mg total) by mouth 2 (two) times daily. 30 tablet 2  . Multiple Vitamin (MULTIVITAMIN) tablet Take 1 tablet by mouth daily.    . rivaroxaban (XARELTO) 20 MG TABS tablet Take 1 tablet (20 mg total) by mouth daily. 90 tablet 2  . predniSONE (DELTASONE) 20 MG tablet Take 20 mg by mouth as needed.     No facility-administered medications prior to visit.    PHYSICAL EXAM Filed Vitals:   02/16/14 1329  BP: 125/52  Pulse: 61  Height: 5\' 10"  (1.778 m)  Weight: 195 lb (88.451 kg)   Body mass index is 27.98 kg/(m^2).  Generalized: Well developed, in no acute distress  Head: normocephalic and atraumatic. Oropharynx benign  Neck: Supple, no carotid bruits  Cardiac: Regular rate rhythm, no murmur  Musculoskeletal: No deformity   Neurological examination  Mentation: Alert oriented to time, place, history taking. Follows all commands speech and language fluent Cranial nerve II-XII: Fundoscopic exam not done. Pupils were equal round reactive to light extraocular movements were full, visual field were full on confrontational test. Facial sensation and strength were normal. hearing was intact to finger rubbing bilaterally. Uvula tongue midline. head turning and shoulder shrug and were normal and symmetric.Tongue protrusion into cheek strength was normal. Motor: The motor testing reveals 5 over 5 strength of all 4 extremities. Good symmetric motor tone is noted throughout.  Sensory: Sensory testing is intact to soft touch on all 4 extremities. No evidence of extinction is noted.  Coordination: Cerebellar testing reveals  good finger-nose-finger and heel-to-shin bilaterally.  Gait and station: Gait is normal. Tandem gait is normal. Romberg is negative. Reflexes: Deep tendon reflexes are symmetric and normal bilaterally.   DIAGNOSTIC DATA (LABS, IMAGING, TESTING) - I reviewed patient records, labs, notes,  testing and imaging myself where available.  Lab Results  Component Value Date   WBC 8.0 10/07/2013   HGB 15.0 10/07/2013   HCT 44.0 10/07/2013   MCV 90.5 10/07/2013   PLT 217 10/07/2013      Component Value Date/Time   NA 138 10/07/2013 1226   K 4.0 10/07/2013 1226   CL 104 10/07/2013 1226   CO2 22 10/07/2013 1222   GLUCOSE 188* 10/07/2013 1226   BUN 11 10/07/2013 1226   CREATININE 1.20 10/07/2013 1226   CALCIUM 9.3 10/07/2013 1222   PROT 7.1 10/07/2013 1222   ALBUMIN 3.8 10/07/2013 1222   AST 18 10/07/2013 1222   ALT 18 10/07/2013 1222   ALKPHOS 83 10/07/2013 1222   BILITOT 0.4 10/07/2013 1222   GFRNONAA 68* 10/07/2013 1222   GFRAA 79* 10/07/2013 1222   Lab Results  Component Value Date   CHOL 175 10/08/2013   HDL 41 10/08/2013   LDLCALC 111* 10/08/2013   TRIG 114 10/08/2013   CHOLHDL 4.3 10/08/2013   Lab Results  Component Value Date   HGBA1C 6.5* 10/08/2013   Data reviewed: I personally reviewed the images and agree with the radiology interpretations.  Ct Head Wo Contrast  10/07/2013 IMPRESSION: No intracranial hemorrhage. The left carotid terminus appears dense which may indicate thrombus potentially leading to left hemispheric infarct. Remote small left cerebellar infarcts suspected rather than horizontal fissure. Streak artifact through the pons. Mild mucosal thickening left sphenoid sinus air cell and ethmoid sinus air cells.  Ct Angio Head & Neck W/cm &/or Wo/cm  10/07/2013 IMPRESSION: 1. Left MCA M2 branch 2 mm filling defect with M2 stenosis and possible associated M3 branch occlusion. 2. Abundant soft plaque left carotid bifurcation. No hemodynamically significant left ICA stenosis. Incidental short-segment left ECA occlusion. 3. Mild-to-moderate extracranial atherosclerosis elsewhere. Intracranial CTA otherwise negative. 4. No CT changes of cortically based infarct, mass effect, or hemorrhage. Questionable early hypodensity left corona radiata.  MRI and  MRA of the brain  10/08/2013 IMPRESSION: Four small foci of acute infarction, 1 in the right hemisphere and 3 in the left hemisphere, consistent with micro embolic infarctions. 1 tiny infarction is present in the right frontal lobe. Two tiny infarctions are present in the left frontal lobe. A 5 x 12 mm infarction is present at the left temporoparietal junction region. No hemorrhage or swelling.  2D Echocardiogram EF 45-50% with no source of embolus. Mild hypokinesis of the apical myocardium.  TEE 10/09/2013 LVEF 50-55%. Dilated LA with spontaneous echo contrast ("smoke"). No LAA thrombus, however, low emptying velocity was noted. No PFO  EKG Sinus rhythm. For complete results please see formal report.  Dg Chest Port 1 View  10/07/2013 IMPRESSION: Borderline cardiomegaly. No acute findings.   Assessment: As you may recall, he is a 68 y.o. Caucasian male with PMH of gout on PRN steroids was admitted in 09/2013 for aphasia and right sided weakness. tPA given. Symptoms resolved quickly. MRI showed cardioembolic stroke and post TEE he had A flutter lasting more than 24 hours. Discharged on Xarelto and lipitor for stroke prevention. After discharge, he is doing well.  Plan:  - continue Xarelto and lipitor for stroke prevention - check BP at home  - follow up with PCP for  stroke risk factor modification - RTC in 6 months with Dr. Elmer Stevens  Jonathon Sabedra E. Nelani Schmelzle, MSN, FNP-BC, A/GNP-C 02/16/2014, 1:58 PM [facility] [address], [address] [place], [place] [address] [phone]  Note: This document was prepared with digital dictation and possible smart phrase technology. Any transcriptional errors that result from this process are unintentional.

## 2014-02-24 ENCOUNTER — Telehealth: Payer: Self-pay | Admitting: Interventional Cardiology

## 2014-02-24 NOTE — Telephone Encounter (Signed)
The pt states that he has 2 teeth that need to be extracted soon and his dentist advised him to call Dr Eldridge Dace for Xarelto instructions. The pt states that he had a stroke about 4 months ago and has been taking Xarelto since then. He states that since he knew he had a dentist appt today he held his dose of Xarelto yesterday. He is advised not to miss anymore doses of Xarelto unless he is instructed to by Dr Eldridge Dace, he verbalized understanding and states he will resume with this evenings dose unless we call him back with instructions from Dr Eldridge Dace.  He states his dentist recommends that he stop Xarelto 4 days prior to extractions and for 1 day after. Please advise.

## 2014-02-24 NOTE — Telephone Encounter (Signed)
New message      Pt needs to have teeth pulled-----can he stop his xarelto 4 days prior to extraction?

## 2014-02-25 ENCOUNTER — Ambulatory Visit: Payer: Medicare Other | Admitting: Neurology

## 2014-02-25 ENCOUNTER — Encounter: Payer: Self-pay | Admitting: *Deleted

## 2014-02-25 NOTE — Telephone Encounter (Signed)
Follow Up        Jonathon Stevens calling back to check on status of Dr. Hoyle Barr response in regards to Xarelto. Jonathon Stevens states he is needing an answer today if possible so he can schedule his dental appt. Please call Jonathon Stevens and advise.

## 2014-02-25 NOTE — Telephone Encounter (Signed)
Note faxed. Confirmation received.

## 2014-02-25 NOTE — Telephone Encounter (Signed)
I spoke with Dr. Eldridge Dace. The patient had a stroke 10/07/13. Per Dr. Eldridge Dace- the patient can hold xarelto for 2 days prior to tooth extraction and 1 day after. I advised the patient I would notify his dental office. He will call me back with the contact information and the name.

## 2014-02-25 NOTE — Telephone Encounter (Signed)
I spoke with the patient. He states he called the dental office and they have scheduled him for tomorrow for his tooth extraction. I have spoken with Dr. Elesa Massed [place]'s office [phone]. They state they need a note stating the patient may be off his blood thinner for 5 days prior and 2 days after his extraction. I advised that per Dr. Eldridge Dace, the note will state that he may be off for 2 days prior and 1 day after his procedure. I explained that he has a stroke history. They would like a statement faxed to [phone]. The patient has been off Xarelto since ~ Sunday, but was not advised by our office to do this.

## 2014-02-25 NOTE — Telephone Encounter (Signed)
Follow Up   Pt requests a call back

## 2014-02-25 NOTE — Telephone Encounter (Signed)
Follow Up  Pt has question and would like a call back at earliest convenience.

## 2014-03-30 ENCOUNTER — Other Ambulatory Visit: Payer: Self-pay | Admitting: Interventional Cardiology

## 2014-04-22 NOTE — Progress Notes (Signed)
I reviewed above note and agree with the assessment and plan.  Marvel Plan, MD PhD Stroke Neurology 04/22/2014 12:19 PM

## 2014-05-18 ENCOUNTER — Ambulatory Visit (INDEPENDENT_AMBULATORY_CARE_PROVIDER_SITE_OTHER): Payer: Medicare Other | Admitting: Interventional Cardiology

## 2014-05-18 ENCOUNTER — Encounter: Payer: Self-pay | Admitting: Interventional Cardiology

## 2014-05-18 VITALS — BP 110/50 | HR 57 | Ht 70.5 in | Wt 195.2 lb

## 2014-05-18 DIAGNOSIS — I6340 Cerebral infarction due to embolism of unspecified cerebral artery: Secondary | ICD-10-CM

## 2014-05-18 DIAGNOSIS — E785 Hyperlipidemia, unspecified: Secondary | ICD-10-CM

## 2014-05-18 DIAGNOSIS — I4892 Unspecified atrial flutter: Secondary | ICD-10-CM

## 2014-05-18 NOTE — Progress Notes (Signed)
Patient ID: Jonathon Stevens, male   DOB: 12-Sep-1945, 69 y.o.   MRN: [id number] Patient ID: Jonathon Stevens, male   DOB: 02-22-1946, 69 y.o.   MRN: [id number]    [address] [place], [place]  [address] Phone: [phone] Fax:  [phone]  Date:  05/18/2014   ID:  Jonathon Stevens, DOB 1946-03-13, MRN [id number]  PCP:  Delorse Lek, MD      History of Present Illness: Jonathon Stevens is a 69 y.o. male who had a CVA.  He had AFib dagnosed in the hospital at that time.  He was started on anticoagulation with Xarelto at that time. He has done well since then.  He denies any palpitations or chest discomfort. He has not been exercising much. Much of the neurologic symptoms that he had at the time of his CVA have resolved.   Of note, there is a question of a mildly decreased left ventricular ejection fraction. The TEE on the following day in the hospital showed normal left ventricular ejection fraction.  He did not have any ischemia evaluation due to lack of symptoms and resolution of LV dysfunction. This may have been tachycardia mediated.   Wt Readings from Last 3 Encounters:  05/18/14 195 lb 3.2 oz (88.542 kg)  02/16/14 195 lb (88.451 kg)  02/02/14 194 lb 1.9 oz (88.052 kg)     Past Medical History  Diagnosis Date  . Gout   . Hx of echocardiogram     a. ECHO 10/08/13: EF 45-50%, mild hypokinesis of apical myocardium. Mild LA dilation, mild RA dilation;  b.  TEE (10/09/13):  EF 50-55%, normal wall motion, no LAA clot  . HLD (hyperlipidemia)   . Atrial flutter   . CVA (cerebral infarction)     embolic CVA 09/2013    Current Outpatient Prescriptions  Medication Sig Dispense Refill  . allopurinol (ZYLOPRIM) 100 MG tablet Take 100 mg by mouth daily.     Marland Kitchen atorvastatin (LIPITOR) 10 MG tablet TAKE 1 TABLET BY MOUTH DAILY AT 6 PM 30 tablet 3  . metoprolol tartrate (LOPRESSOR) 25 MG tablet TAKE 1/2 TABLET BY MOUTH TWICE DAILY 30 tablet 3  . Multiple Vitamin (MULTIVITAMIN)  tablet Take 1 tablet by mouth daily.    . predniSONE (DELTASONE) 20 MG tablet Take 20 mg by mouth as needed (for gout).     . rivaroxaban (XARELTO) 20 MG TABS tablet Take 1 tablet (20 mg total) by mouth daily. 90 tablet 2   No current facility-administered medications for this visit.    Allergies:   No Known Allergies  Social History:  The patient  reports that he quit smoking about 7 months ago. His smoking use included Cigarettes. He has a 7.5 pack-year smoking history. He does not have any smokeless tobacco history on file. He reports that he does not drink alcohol or use illicit drugs.   Family History:  The patient's family history includes Hypertension in his father and mother.   ROS:  Please see the history of present illness.  No nausea, vomiting.  No fevers, chills.  No focal weakness.  No dysuria. No bleeding problems.   All other systems reviewed and negative.   PHYSICAL EXAM: VS:  BP 110/50 mmHg  Pulse 57  Ht 5' 10.5" (1.791 m)  Wt 195 lb 3.2 oz (88.542 kg)  BMI 27.60 kg/m2 General: Well developed, well nourished, in no acute distress HEENT: normal Neck: no JVD, no carotid bruits Cardiac:  normal S1, S2; RRR  with occasional premature beats  Lungs:  clear to auscultation bilaterally, no wheezing, rhonchi or rales Abd: soft, nontender, no hepatomegaly Ext: no edema Skin: warm and dry Neuro:   no focal abnormalities noted Psych: normal affect  EKG:  Atrial flutter with variable block  ASSESSMENT AND PLAN:  1. Atrial fibrillation/atrial flutter: Today,heart rate in the low 60s.  Doing well on the metoprolol.  No palpitations.  I encouraged him to take his Xarelto at night since dinner is the biggest meal of the day for him. He is agreeable to this change.  I stressed the importance of this medication to help prevent stroke, especially in the setting of him having recurrent atrial arrhythmias.  Now that deductible is paid, cost of Xarelto is better. 2. LDL was 111 in the  hospital. He was started on a low-dose statin.  This was  done with his primary care doctor. LDL target is less than 100. 3. Question of cardiomyopathy: He has not had any signs or symptoms of ischemia. Given that the repeat echocardiogram showed improved left ventricular ejection fraction, will not pursue ischemia evaluation at this time.  No chest pain.  Plan to see him back in  Ayear. I encouraged 150 minutes of exercise per week. Walking would probably be the easiest thing for him to do.  Signed, Fredric Mare, MD, [facility] 05/18/2014 9:26 AM

## 2014-05-18 NOTE — Patient Instructions (Signed)
Your physician recommends that you continue on your current medications as directed. Please refer to the Current Medication list given to you today.  Your physician recommends that you return for lab work in: 6 months (CBC, BMET)   Your physician wants you to follow-up in: 1 year with Dr. Eldridge Dace. You will receive a reminder letter in the mail two months in advance. If you don't receive a letter, please call our office to schedule the follow-up appointment.

## 2014-07-31 ENCOUNTER — Other Ambulatory Visit: Payer: Self-pay | Admitting: Interventional Cardiology

## 2014-08-10 ENCOUNTER — Other Ambulatory Visit: Payer: Self-pay | Admitting: Interventional Cardiology

## 2014-08-17 ENCOUNTER — Telehealth: Payer: Self-pay | Admitting: Interventional Cardiology

## 2014-08-17 ENCOUNTER — Telehealth: Payer: Self-pay

## 2014-08-17 NOTE — Telephone Encounter (Signed)
Morrie Sheldon called our Coumadin clinic stating pt had not been contacted regarding Lovenox bridging for upcoming colonoscopy with Dr Elnoria Howard on 08/18/14.  Pt is not seen in our Coumadin clinic, in fact pt is not on Coumadin he is on Xarelto.  They received fax on 08/04/14 with written instructions by Dr Eldridge Dace to give clearance to pharmacist for bridging protocol.  Audrie Lia, PharmD had written instructions for upcoming procedure on fax, but when sent to [facility] fax messed up and instructions were not completely legible.  Instead of contacting our office as soon as they received for clarification, they are calling today inquiring why pt has not been contacted to be Lovenox bridged.    Truddie Crumble we do not usually bridge Xarelto pt's because of the limited amt of time they hold medication prior to procedures.  She faxed me a copy of the fax they received and I called Audrie Lia, PharmD for clarification.  Fax states: Pt has a history of cardioembolic stroke in 09/2013.  If pt a candidate to have colonoscopy done on Xarelto would prefer this as pt continues to have afib.  If not, hold Xarelto 24 hrs prior to procedure and resume ASAP. Per protocol/Sally Simona Huh, PharmD.  Called Morrie Sheldon to advise of instructions will fax to their office as well.

## 2014-08-17 NOTE — Telephone Encounter (Signed)
New message    Patient calling stating he was suppose to have colonoscopy that schedule on tomorrow. However, no one has called him back regarding bridged of Lovenox - his colonoscopy has been reschedule to June.

## 2014-08-17 NOTE — Telephone Encounter (Signed)
Spoke with pt and told him to have the office performing the colonoscopy to fax over a clearance form or call our office with the information and we would take care of bridging if needed or holding his Xarelto.  Pt verbalized understanding and was in agreement with this plan.

## 2014-08-21 ENCOUNTER — Telehealth: Payer: Self-pay | Admitting: Interventional Cardiology

## 2014-08-21 NOTE — Telephone Encounter (Signed)
New message     Patient calling checking on the status of cardiac clearance spoke with nurse on  5/23.    Patient would like a call back today.

## 2014-08-21 NOTE — Telephone Encounter (Signed)
Calling stating he has not heard anything regarding his Xarelto.  Advised a fax was sent to [facility] on 5/23 stating per protocol Mamie Laurel) hold Xarelto 24 hrs prior to procedure and restart ASAP after procedure.  He states he had not heard from [place] from [facility]. He verbalizes understanding.

## 2014-08-25 ENCOUNTER — Ambulatory Visit: Payer: Medicare Other | Admitting: Neurology

## 2014-11-05 ENCOUNTER — Encounter: Payer: Self-pay | Admitting: Neurology

## 2014-11-05 ENCOUNTER — Ambulatory Visit (INDEPENDENT_AMBULATORY_CARE_PROVIDER_SITE_OTHER): Payer: Medicare Other | Admitting: Neurology

## 2014-11-05 VITALS — BP 115/70 | HR 86 | Ht 70.0 in | Wt 190.8 lb

## 2014-11-05 DIAGNOSIS — I480 Paroxysmal atrial fibrillation: Secondary | ICD-10-CM | POA: Insufficient documentation

## 2014-11-05 DIAGNOSIS — E785 Hyperlipidemia, unspecified: Secondary | ICD-10-CM

## 2014-11-05 DIAGNOSIS — I639 Cerebral infarction, unspecified: Secondary | ICD-10-CM | POA: Diagnosis not present

## 2014-11-05 DIAGNOSIS — I4892 Unspecified atrial flutter: Secondary | ICD-10-CM

## 2014-11-05 NOTE — Patient Instructions (Signed)
-   continue Xarelto and lipitor for stroke prevention - check BP at home - Follow up with your primary care physician for stroke risk factor modification. Recommend maintain blood pressure goal <130/80, diabetes with hemoglobin A1c goal below 6.5% and lipids with LDL cholesterol goal below 70 mg/dL.  - follow up with cardiology as scheduled. - follow up as needed.

## 2014-11-05 NOTE — Progress Notes (Signed)
STROKE NEUROLOGY FOLLOW UP NOTE  NAME: Jonathon Stevens DOB: 21-May-1945  REASON FOR VISIT: stroke follow up HISTORY FROM: pt and chart  Today we had Jonathon pleasure of seeing Jonathon Stevens in follow-up at our [facility]rology [facility]. Pt was accompanied by wife.   History Summary Jonathon Stevens is an 69 y.o. male with non significant PMHx other than gout was admitted in 09/2013 for right side weakness and aphasia. CT head was negative and he received tPA. His symptoms quickly improved. 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 He was later found to have A flutter post TEE which lasted more than 24 hours and then converted to NSR Jonathon next day. Due to high CHA2DS2-VASc score of 5, he was started on Xarelto and lopressor. LDL 111. He was discharged home with Xarelto and lipitor for stroke prevention.   11/19/13 follow up - Jonathon Stevens has been doing well. No residue deficit of stroke. He takes medication everyday without missing doses.  Initially feels tired but now back to his baseline. No complains in clinic  02/17/15 follow up (LL) - Jonathon Stevens has been doing well. No residual deficit of stroke. He takes medication everyday without missing doses. Initially feels tired but now back to his baseline. Blood pressure is well controlled, it is 125/52 in Jonathon office today. He is tolerating Xarelto well with no signs of significant bleeding or bruising. No complaints in clinic.  Interval History During Jonathon interval time, pt has been doing well. No recurrent stroke like symptoms. Continued on Xarelto and statin. Followed up with Dr. Eldridge Dace, no change of treatment. Will have blood draw 11/16/14. Bp today in clinic 115/70. Pt complains of some short memory loss but stated that if he concentrates, his memory is OK.  REVIEW OF SYSTEMS: Full 14 system review of systems performed and notable only for those listed below and in HPI above, all others are negative:    Constitutional: N/A  Cardiovascular: N/A  Ear/Nose/Throat: N/A  Skin: N/A  Eyes: N/A  Respiratory: N/A  Gastroitestinal: N/A  Genitourinary: N/A Hematology/Lymphatic: N/A  Endocrine: N/A  Musculoskeletal: N/A  Allergy/Immunology: N/A  Neurological: memory loss  Psychiatric: N/A  Jonathon following represents Jonathon Stevens's updated allergies and side effects list: No Known Allergies  Labs since last visit of relevance include Jonathon following: Results for orders placed or performed during Jonathon hospital encounter of 10/07/13  MRSA PCR Screening  Result Value Ref Range   MRSA by PCR NEGATIVE NEGATIVE  Ethanol  Result Value Ref Range   Alcohol, Ethyl (B) <11 0 - 11 mg/dL  Protime-INR  Result Value Ref Range   Prothrombin Time 14.0 11.6 - 15.2 seconds   INR 1.08 0.00 - 1.49  APTT  Result Value Ref Range   aPTT 29 24 - 37 seconds  CBC  Result Value Ref Range   WBC 8.0 4.0 - 10.5 K/uL   RBC 4.63 4.22 - 5.81 MIL/uL   Hemoglobin 14.5 13.0 - 17.0 g/dL   HCT 16.1 09.6 - 04.5 %   MCV 90.5 78.0 - 100.0 fL   MCH 31.3 26.0 - 34.0 pg   MCHC 34.6 30.0 - 36.0 g/dL   RDW 40.9 81.1 - 91.4 %   Platelets 217 150 - 400 K/uL  Differential  Result Value Ref Range   Neutrophils Relative % 68 43 - 77 %   Neutro Abs 5.5 1.7 - 7.7 K/uL   Lymphocytes Relative 22 12 - 46 %   Lymphs  Abs 1.8 0.7 - 4.0 K/uL   Monocytes Relative 5 3 - 12 %   Monocytes Absolute 0.4 0.1 - 1.0 K/uL   Eosinophils Relative 4 0 - 5 %   Eosinophils Absolute 0.3 0.0 - 0.7 K/uL   Basophils Relative 1 0 - 1 %   Basophils Absolute 0.0 0.0 - 0.1 K/uL  Comprehensive metabolic panel  Result Value Ref Range   Sodium 138 137 - 147 mEq/L   Potassium 4.3 3.7 - 5.3 mEq/L   Chloride 99 96 - 112 mEq/L   CO2 22 19 - 32 mEq/L   Glucose, Bld 193 (H) 70 - 99 mg/dL   BUN 12 6 - 23 mg/dL   Creatinine, Ser 6.21 0.50 - 1.35 mg/dL   Calcium 9.3 8.4 - 30.8 mg/dL   Total Protein 7.1 6.0 - 8.3 g/dL   Albumin 3.8 3.5 - 5.2 g/dL   AST 18 0 -  37 U/L   ALT 18 0 - 53 U/L   Alkaline Phosphatase 83 39 - 117 U/L   Total Bilirubin 0.4 0.3 - 1.2 mg/dL   GFR calc non Af Amer 68 (L) >90 mL/min   GFR calc Af Amer 79 (L) >90 mL/min   Anion gap 17 (H) 5 - 15  Urine Drug Screen  Result Value Ref Range   Opiates NONE DETECTED NONE DETECTED   Cocaine NONE DETECTED NONE DETECTED   Benzodiazepines NONE DETECTED NONE DETECTED   Amphetamines NONE DETECTED NONE DETECTED   Tetrahydrocannabinol NONE DETECTED NONE DETECTED   Barbiturates NONE DETECTED NONE DETECTED  Urinalysis, Routine w reflex microscopic  Result Value Ref Range   Color, Urine YELLOW YELLOW   APPearance CLEAR CLEAR   Specific Gravity, Urine 1.013 1.005 - 1.030   pH 5.5 5.0 - 8.0   Glucose, UA NEGATIVE NEGATIVE mg/dL   Hgb urine dipstick NEGATIVE NEGATIVE   Bilirubin Urine NEGATIVE NEGATIVE   Ketones, ur NEGATIVE NEGATIVE mg/dL   Protein, ur NEGATIVE NEGATIVE mg/dL   Urobilinogen, UA 0.2 0.0 - 1.0 mg/dL   Nitrite NEGATIVE NEGATIVE   Leukocytes, UA NEGATIVE NEGATIVE  Hemoglobin A1c  Result Value Ref Range   Hgb A1c MFr Bld 6.5 (H) <5.7 %   Mean Plasma Glucose 140 (H) <117 mg/dL  Glucose, capillary  Result Value Ref Range   Glucose-Capillary 131 (H) 70 - 99 mg/dL   Comment 1 Notify RN    Comment 2 Documented in Chart   Lipid panel  Result Value Ref Range   Cholesterol 175 0 - 200 mg/dL   Triglycerides 657 <846 mg/dL   HDL 41 >96 mg/dL   Total CHOL/HDL Ratio 4.3 RATIO   VLDL 23 0 - 40 mg/dL   LDL Cholesterol 295 (H) 0 - 99 mg/dL  Glucose, capillary  Result Value Ref Range   Glucose-Capillary 102 (H) 70 - 99 mg/dL  Glucose, capillary  Result Value Ref Range   Glucose-Capillary 106 (H) 70 - 99 mg/dL  Glucose, capillary  Result Value Ref Range   Glucose-Capillary 190 (H) 70 - 99 mg/dL   Comment 1 Notify RN    Comment 2 Documented in Chart   Glucose, capillary  Result Value Ref Range   Glucose-Capillary 133 (H) 70 - 99 mg/dL   Comment 1 Notify RN    Comment  2 Documented in Chart   Glucose, capillary  Result Value Ref Range   Glucose-Capillary 101 (H) 70 - 99 mg/dL   Comment 1 Documented in Chart    Comment  2 Notify RN   Glucose, capillary  Result Value Ref Range   Glucose-Capillary 128 (H) 70 - 99 mg/dL   Comment 1 Notify RN    Comment 2 Documented in Chart   Glucose, capillary  Result Value Ref Range   Glucose-Capillary 265 (H) 70 - 99 mg/dL  Glucose, capillary  Result Value Ref Range   Glucose-Capillary 127 (H) 70 - 99 mg/dL   Comment 1 Documented in Chart    Comment 2 Notify RN   Glucose, capillary  Result Value Ref Range   Glucose-Capillary 106 (H) 70 - 99 mg/dL  I-stat chem 8, ed  Result Value Ref Range   Sodium 138 137 - 147 mEq/L   Potassium 4.0 3.7 - 5.3 mEq/L   Chloride 104 96 - 112 mEq/L   BUN 11 6 - 23 mg/dL   Creatinine, Ser 5.40 0.50 - 1.35 mg/dL   Glucose, Bld 981 (H) 70 - 99 mg/dL   Calcium, Ion 1.91 (L) 1.13 - 1.30 mmol/L   TCO2 21 0 - 100 mmol/L   Hemoglobin 15.0 13.0 - 17.0 g/dL   HCT 47.8 29.5 - 62.1 %  I-Stat Troponin, ED (not at [facility])  Result Value Ref Range   Troponin i, poc 0.02 0.00 - 0.08 ng/mL   Comment 3          CBG monitoring, ED  Result Value Ref Range   Glucose-Capillary 150 (H) 70 - 99 mg/dL  CBG monitoring, ED  Result Value Ref Range   Glucose-Capillary 103 (H) 70 - 99 mg/dL   Comment 1 Notify RN     Jonathon neurologically relevant items on Jonathon Stevens's problem list were reviewed on today's visit.  Neurologic Examination  A problem focused neurological exam (12 or more points of Jonathon single system neurologic examination, vital signs counts as 1 point, cranial nerves count for 8 points) was performed.  Blood pressure 115/70, pulse 86, height  (1.778 m), weight 190 lb 12.8 oz (86.546 kg).  General - Well nourished, well developed, in no apparent distress.  Ophthalmologic - not able to see through.  Cardiovascular - Regular rate and rhythm with no murmur.  Mental Status -    Level of arousal and orientation to time, place, and person were intact. Language including expression, naming, repetition, comprehension, reading, and writing was assessed and found intact.  Cranial Nerves II - XII - II - Visual field intact OU. III, IV, VI - Extraocular movements intact. V - Facial sensation intact bilaterally. VII - Facial movement intact bilaterally. VIII - Hearing & vestibular intact bilaterally. X - Palate elevates symmetrically. XI - Chin turning & shoulder shrug intact bilaterally. XII - Tongue protrusion intact.  Motor Strength - Jonathon Stevens's strength was normal in all extremities and pronator drift was absent.  Bulk was normal and fasciculations were absent.   Motor Tone - Muscle tone was assessed at Jonathon neck and appendages and was normal.  Reflexes - Jonathon Stevens's reflexes were normal in all extremities and he had no pathological reflexes.  Sensory - Light touch, temperature/pinprick, vibration and proprioception, and Romberg testing were assessed and were normal.    Coordination - Jonathon Stevens had normal movements in Jonathon hands and feet with no ataxia or dysmetria.  Tremor was absent.  Gait and Station - Jonathon Stevens's transfers, posture, gait, station, and turns were observed as normal.  Data reviewed: I personally reviewed Jonathon images and agree with Jonathon radiology interpretations.  Ct Head Wo Contrast  10/07/2013  IMPRESSION: No intracranial hemorrhage. Jonathon left carotid terminus appears dense which may indicate thrombus potentially leading to left hemispheric infarct. Remote small left cerebellar infarcts suspected rather than horizontal fissure. Streak artifact through Jonathon pons. Mild mucosal thickening left sphenoid sinus air cell and ethmoid sinus air cells.  Ct Angio Head & Neck W/cm &/or Wo/cm  10/07/2013 IMPRESSION: 1. Left MCA M2 branch 2 mm filling defect with M2 stenosis and possible associated M3 branch occlusion. 2. Abundant soft plaque left carotid  bifurcation. No hemodynamically significant left ICA stenosis. Incidental short-segment left ECA occlusion. 3. Mild-to-moderate extracranial atherosclerosis elsewhere. Intracranial CTA otherwise negative. 4. No CT changes of cortically based infarct, mass effect, or hemorrhage. Questionable early hypodensity left corona radiata.  MRI and MRA of Jonathon brain  10/08/2013 IMPRESSION: Four small foci of acute infarction, 1 in Jonathon right hemisphere and 3 in Jonathon left hemisphere, consistent with micro embolic infarctions. 1 tiny infarction is present in Jonathon right frontal lobe. Two tiny infarctions are present in Jonathon left frontal lobe. A 5 x 12 mm infarction is present at Jonathon left temporoparietal junction region. No hemorrhage or swelling.  2D Echocardiogram EF 45-50% with no source of embolus. Mild hypokinesis of Jonathon apical myocardium.  TEE 10/09/2013 LVEF 50-55%. Dilated LA with spontaneous echo contrast ("smoke"). No LAA thrombus, however, low emptying velocity was noted. No PFO  EKG Sinus rhythm. For complete results please see formal report.  Dg Chest Port 1 View  10/07/2013 IMPRESSION: Borderline cardiomegaly. No acute findings.  Component     Latest Ref Rng 10/08/2013  Cholesterol     0 - 200 mg/dL 726  Triglycerides     <150 mg/dL 203  HDL Cholesterol     >39 mg/dL 41  Total CHOL/HDL Ratio      4.3  VLDL     0 - 40 mg/dL 23  LDL (calc)     0 - 99 mg/dL 559 (H)  Hemoglobin R4B     <5.7 % 6.5 (H)  Mean Plasma Glucose     <117 mg/dL 638 (H)    Assessment: As you may recall, he is a 69 y.o. Caucasian male with PMH of gout on PRN steroids was admitted in 09/2013 for aphasia and right sided weakness. tPA given. Symptoms resolved quickly. MRI showed cardioembolic stroke and post TEE he had A flutter lasting more than 24 hours. Discharged on Xarelto and lipitor for stroke prevention. After discharge, he has been doing well. No recurrent symptoms. On Xarelto without missing doses.  Plan:  - continue  Xarelto and lipitor for stroke prevention - check BP at home - Follow up with your primary care physician for stroke risk factor modification. Recommend maintain blood pressure goal <130/80, diabetes with hemoglobin A1c goal below 6.5% and lipids with LDL cholesterol goal below 70 mg/dL.  - follow up with cardiology as scheduled. - RTC PRN  No orders of Jonathon defined types were placed in this encounter.    Stevens Instructions  - continue Xarelto and lipitor for stroke prevention - check BP at home - Follow up with your primary care physician for stroke risk factor modification. Recommend maintain blood pressure goal <130/80, diabetes with hemoglobin A1c goal below 6.5% and lipids with LDL cholesterol goal below 70 mg/dL.  - follow up with cardiology as scheduled. - follow up as needed.    Marvel Plan, MD PhD [facility] [address] [place], [place] [address] [phone]

## 2014-11-06 ENCOUNTER — Ambulatory Visit: Payer: Self-pay | Admitting: Neurology

## 2014-11-16 ENCOUNTER — Other Ambulatory Visit (INDEPENDENT_AMBULATORY_CARE_PROVIDER_SITE_OTHER): Payer: Medicare Other | Admitting: *Deleted

## 2014-11-16 DIAGNOSIS — I4892 Unspecified atrial flutter: Secondary | ICD-10-CM

## 2014-11-16 DIAGNOSIS — E785 Hyperlipidemia, unspecified: Secondary | ICD-10-CM | POA: Diagnosis not present

## 2014-11-16 LAB — BASIC METABOLIC PANEL
BUN: 17 mg/dL (ref 6–23)
CALCIUM: 9.6 mg/dL (ref 8.4–10.5)
CO2: 26 mEq/L (ref 19–32)
Chloride: 105 mEq/L (ref 96–112)
Creatinine, Ser: 1.34 mg/dL (ref 0.40–1.50)
GFR: 56.12 mL/min — AB (ref >60.00)
Glucose, Bld: 118 mg/dL — ABNORMAL HIGH (ref 70–99)
Potassium: 4.3 mEq/L (ref 3.5–5.1)
Sodium: 139 mEq/L (ref 135–145)

## 2014-11-16 LAB — CBC
HCT: 46.5 % (ref 39.0–52.0)
Hemoglobin: 15.9 g/dL (ref 13.0–17.0)
MCHC: 34.2 g/dL (ref 30.0–36.0)
MCV: 92 fl (ref 78.0–100.0)
PLATELETS: 187 10*3/uL (ref 150.0–400.0)
RBC: 5.05 Mil/uL (ref 4.22–5.81)
RDW: 13.6 % (ref 11.5–15.5)
WBC: 8.9 10*3/uL (ref 4.0–10.5)

## 2014-12-22 ENCOUNTER — Encounter: Payer: Medicare Other | Attending: Family Medicine

## 2014-12-22 VITALS — Ht 70.0 in | Wt 190.8 lb

## 2014-12-22 DIAGNOSIS — E119 Type 2 diabetes mellitus without complications: Secondary | ICD-10-CM

## 2014-12-22 DIAGNOSIS — Z713 Dietary counseling and surveillance: Secondary | ICD-10-CM | POA: Diagnosis not present

## 2014-12-22 NOTE — Progress Notes (Signed)

## 2014-12-29 ENCOUNTER — Encounter: Payer: Medicare Other | Attending: Family Medicine

## 2014-12-29 DIAGNOSIS — E119 Type 2 diabetes mellitus without complications: Secondary | ICD-10-CM | POA: Insufficient documentation

## 2014-12-29 DIAGNOSIS — Z713 Dietary counseling and surveillance: Secondary | ICD-10-CM | POA: Insufficient documentation

## 2014-12-29 DIAGNOSIS — R7303 Prediabetes: Secondary | ICD-10-CM

## 2014-12-29 NOTE — Progress Notes (Signed)

## 2015-01-05 DIAGNOSIS — E119 Type 2 diabetes mellitus without complications: Secondary | ICD-10-CM | POA: Diagnosis not present

## 2015-01-05 NOTE — Progress Notes (Signed)
Patient was seen on 01/05/15 for the third of a series of three diabetes self-management courses at the [facility].   Janene Madeira the amount of activity recommended for healthy living . Describe activities suitable for individual needs . Identify ways to regularly incorporate activity into daily life . Identify barriers to activity and ways to over come these barriers  Identify diabetes medications being personally used and their primary action for lowering glucose and possible side effects . Describe role of stress on blood glucose and develop strategies to address psychosocial issues . Identify diabetes complications and ways to prevent them  Explain how to manage diabetes during illness . Evaluate success in meeting personal goal . Establish 2-3 goals that they will plan to diligently work on until they return for the  46-month follow-up visit  Goals:   I will count my carb choices at most meals and snacks  I will be active 20 minutes or more 6 times a week  To help manage stress I will  "veg" at least 7 times a week  Your patient has identified these potential barriers to change:  Stress  Your patient has identified their diabetes self-care support plan as  Family Support Plan:  Attend Optional Core 4 in 4 months

## 2015-02-19 ENCOUNTER — Other Ambulatory Visit: Payer: Self-pay | Admitting: Interventional Cardiology

## 2015-05-13 ENCOUNTER — Other Ambulatory Visit: Payer: Self-pay | Admitting: Interventional Cardiology

## 2015-05-13 MED ORDER — RIVAROXABAN 20 MG PO TABS: 20.0000 mg | ORAL_TABLET | Freq: Every day | ORAL | Status: DC

## 2015-05-21 ENCOUNTER — Other Ambulatory Visit: Payer: Self-pay | Admitting: Interventional Cardiology

## 2015-06-16 ENCOUNTER — Encounter: Payer: Self-pay | Admitting: Interventional Cardiology

## 2015-06-22 DIAGNOSIS — R7301 Impaired fasting glucose: Secondary | ICD-10-CM | POA: Insufficient documentation

## 2015-06-22 DIAGNOSIS — N529 Male erectile dysfunction, unspecified: Secondary | ICD-10-CM | POA: Insufficient documentation

## 2015-06-22 DIAGNOSIS — M1000 Idiopathic gout, unspecified site: Secondary | ICD-10-CM | POA: Insufficient documentation

## 2015-07-20 ENCOUNTER — Ambulatory Visit (INDEPENDENT_AMBULATORY_CARE_PROVIDER_SITE_OTHER): Payer: Medicare Other | Admitting: Interventional Cardiology

## 2015-07-20 ENCOUNTER — Encounter: Payer: Self-pay | Admitting: Interventional Cardiology

## 2015-07-20 VITALS — BP 120/70 | HR 80 | Ht 70.0 in | Wt 190.0 lb

## 2015-07-20 DIAGNOSIS — E785 Hyperlipidemia, unspecified: Secondary | ICD-10-CM | POA: Diagnosis not present

## 2015-07-20 DIAGNOSIS — I4892 Unspecified atrial flutter: Secondary | ICD-10-CM | POA: Diagnosis not present

## 2015-07-20 NOTE — Progress Notes (Signed)
Patient ID: Jonathon Stevens, male   DOB: 18-Jul-1945, 71 y.o.   MRN: [id number]     Cardiology Office Note   Date:  07/20/2015   ID:  Jonathon Stevens, DOB Aug 04, 1945, MRN [id number]  PCP:  Delorse Lek, MD    No chief complaint on file. f/u AFib   Wt Readings from Last 3 Encounters:  07/20/15 190 lb (86.183 kg)  12/22/14 190 lb 12.8 oz (86.546 kg)  11/05/14 190 lb 12.8 oz (86.546 kg)       History of Present Illness: Jonathon Stevens is a 70 y.o. male  who had a CVA. He had AFib dagnosed in the hospital at that time. He was started on anticoagulation with Xarelto at that time. He has done well since then. He denies any palpitations or chest discomfort. He has not been exercising much. Much of the neurologic symptoms that he had at the time of his CVA have resolved.   Of note, there is a question of a mildly decreased left ventricular ejection fraction. The TEE on the following day in the hospital showed normal left ventricular ejection fraction. He did not have any ischemia evaluation due to lack of symptoms and resolution of LV dysfunction. This may have been tachycardia mediated.  No chest pain.  He had increased blood sugar so he changed his diet and is trying to lose 20 lbs.  BP ,cholesterol was controlled in 3/17.  HbA1C was up to 6.6.  He has been trying to lose weight for the past week.      Past Medical History  Diagnosis Date  . Gout   . Hx of echocardiogram     a. ECHO 10/08/13: EF 45-50%, mild hypokinesis of apical myocardium. Mild LA dilation, mild RA dilation;  b.  TEE (10/09/13):  EF 50-55%, normal wall motion, no LAA clot  . HLD (hyperlipidemia)   . Atrial flutter (HCC)   . CVA (cerebral infarction)     embolic CVA 09/2013  . Stroke (HCC)   . Diabetes mellitus without complication [facility])     Past Surgical History  Procedure Laterality Date  . Tee without cardioversion N/A 10/09/2013    Procedure: TRANSESOPHAGEAL ECHOCARDIOGRAM (TEE);  Surgeon: Chrystie Nose, MD;  Location: [facility] ENDOSCOPY;  Service: Cardiovascular;  Laterality: N/A;     Current Outpatient Prescriptions  Medication Sig Dispense Refill  . allopurinol (ZYLOPRIM) 100 MG tablet Take 100 mg by mouth daily.     Marland Kitchen atorvastatin (LIPITOR) 10 MG tablet TAKE 1 TABLET BY MOUTH DAILY AT 6PM 90 tablet 0  . BOOSTRIX 5-2.5-18.5 injection Inject 0.5 mLs into the muscle once.     . metoprolol tartrate (LOPRESSOR) 25 MG tablet TAKE 1/2 TABLET BY MOUTH TWICE DAILY 90 tablet 0  . Multiple Vitamin (MULTIVITAMIN) tablet Take 1 tablet by mouth daily.    . rivaroxaban (XARELTO) 20 MG TABS tablet Take 1 tablet (20 mg total) by mouth daily. 90 tablet 0   No current facility-administered medications for this visit.    Allergies:   Indomethacin    Social History:  The patient  reports that he quit smoking 7 days ago. His smoking use included Cigarettes. He has a 7.5 pack-year smoking history. He does not have any smokeless tobacco history on file. He reports that he does not drink alcohol or use illicit drugs.   Family History:  The patient's family history includes Hypertension in his father and mother; Stroke in his mother. There is no history of Heart attack.  ROS:  Please see the history of present illness.   Otherwise, review of systems are positive for trying to lose weight; making dietary changes; lost 4 lbs in the past week intentionally.   All other systems are reviewed and negative.    PHYSICAL EXAM: VS:  BP 120/70 mmHg  Pulse 80  Ht  (1.778 m)  Wt 190 lb (86.183 kg)  BMI 27.26 kg/m2 , BMI Body mass index is 27.26 kg/(m^2). GEN: Well nourished, well developed, in no acute distress HEENT: normal Neck: no JVD, carotid bruits, or masses Cardiac: irregularly irregular; no murmurs, rubs, or gallops,no edema  Respiratory:  clear to auscultation bilaterally, normal work of breathing GI: soft, nontender, nondistended, + BS MS: no deformity or atrophy Skin: warm and dry, no  rash Neuro:  Strength and sensation are intact Psych: euthymic mood, full affect   EKG:   The ekg ordered today demonstrates AFib, rate controlled   Recent Labs: 11/16/2014: BUN 17; Creatinine, Ser 1.34; Hemoglobin 15.9; Platelets 187.0; Potassium 4.3; Sodium 139   Lipid Panel    Component Value Date/Time   CHOL 175 10/08/2013 0241   TRIG 114 10/08/2013 0241   HDL 41 10/08/2013 0241   CHOLHDL 4.3 10/08/2013 0241   VLDL 23 10/08/2013 0241   LDLCALC 111* 10/08/2013 0241     Other studies Reviewed: Additional studies/ records that were reviewed today with results demonstrating: echo from 2015 reviewed.  EF 45-50%.   ASSESSMENT AND PLAN:  1. AFib/atrial flutter: rate controled.  COntinue Xarelto or stroke prevention.  He had a CVA in 2015.   2. Hyperlipidemia: Continue atorvastatin.  LDL target < 100 due to prior CVA. 3. Cardiomyopathy- No signs of CHF.  Asymptomatic.  Holding of on ischemia evaluation.  He will let us know If he has symptoms.   Current medicines are reviewed at length with the patient today.  The patient concerns regarding his medicines were addressed.  The following changes have been made:  No change  Labs/ tests ordered today include:   Orders Placed This Encounter  Procedures  . EKG 12-Lead    Recommend 150 minutes/week of aerobic exercise Low fat, low carb, high fiber diet recommended  Disposition:   FU in 1 year   Delorise Jackson., MD  07/20/2015 1:41 PM    [facility] [address] [place], [place], [place]  [address] Phone: [phone]; Fax: [phone]

## 2015-07-20 NOTE — Patient Instructions (Signed)

## 2015-08-03 ENCOUNTER — Other Ambulatory Visit: Payer: Self-pay | Admitting: *Deleted

## 2015-08-03 MED ORDER — RIVAROXABAN 20 MG PO TABS: 20.0000 mg | ORAL_TABLET | Freq: Every day | ORAL | Status: DC

## 2015-08-16 ENCOUNTER — Other Ambulatory Visit: Payer: Self-pay | Admitting: Interventional Cardiology

## 2016-07-12 NOTE — Progress Notes (Signed)
Patient ID: Jonathon Stevens, male   DOB: [birth year], 71 y.o.   MRN: [id number]     Cardiology Office Note   Date:  07/13/2016   ID:  [facility], DOB [birth year], MRN [id number]  PCP:  Jonathon Lek, MD    No chief complaint on file. f/u AFib   Wt Readings from Last 3 Encounters:  07/13/16 169 lb 12.8 oz (77 kg)  07/20/15 190 lb (86.2 kg)  12/22/14 190 lb 12.8 oz (86.5 kg)       History of Present Illness: Jonathon Stevens is a 71 y.o. male  who had a CVA in 2015. He had AFib dagnosed in the hospital at that time. He was started on anticoagulation with Xarelto at that time. He had done well since then. Much of the neurologic symptoms that he had at the time of his CVA resolved.   In 2015, there was a question of a mildly decreased left ventricular ejection fraction. The TEE on the following day in the hospital showed normal left ventricular ejection fraction. He did not have any ischemia evaluation due to lack of symptoms and resolution of LV dysfunction. This may have been tachycardia mediated.  No chest pain.  He had increased blood sugar so he changed his diet and is trying to lose 20 lbs.  BP ,cholesterol was controlled in 3/17.  HbA1C was up to 6.6.  He has been trying to lose weight for the past week.   Over the past few months, he has some occasional fluttering.  In April 2018, he had a day while working in the bathroom doing [facility].  He had some dizziness.  He gets vertigo when leaning back.  It improved after that.  He has some lightheadedness when he stands quickly.    He has had some depression.  His shortterm memory has decreased as well. No bleeding problems on the Xarelto.  He walks a few miles a day, but he feels tired.  He has lost about 21 lbs in the past year through portion control and watching carb intake.  A1C decreased as well.     Past Medical History:  Diagnosis Date  . Atrial flutter (HCC)   . 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 Diabetes mellitus without complication (HCC)   . Gout   . HLD (hyperlipidemia)   . Hx of echocardiogram    a. ECHO 10/08/13: EF 45-50%, mild hypokinesis of apical myocardium. Mild LA dilation, mild RA dilation;  b.  TEE (10/09/13):  EF 50-55%, normal wall motion, no LAA clot  . Stroke [facility])     Past Surgical History:  Procedure Laterality Date  . TEE WITHOUT CARDIOVERSION N/A 10/09/2013   Procedure: TRANSESOPHAGEAL ECHOCARDIOGRAM (TEE);  Surgeon: Jonathon Nose, MD;  Location: [facility];  Service: Cardiovascular;  Laterality: N/A;     Current Outpatient Prescriptions  Medication Sig Dispense Refill  . allopurinol (ZYLOPRIM) 300 MG tablet Take 1 tablet by mouth daily.    Marland Kitchen atorvastatin (LIPITOR) 10 MG tablet TAKE 1 TABLET BY MOUTH DAILY AT 6PM 90 tablet 3  . BOOSTRIX 5-2.5-18.5 injection Inject 0.5 mLs into the muscle once.     . metoprolol tartrate (LOPRESSOR) 25 MG tablet Take 0.5 tablets (12.5 mg total) by mouth 2 (two) times daily. 90 tablet 3  . Multiple Vitamin (MULTIVITAMIN) tablet Take 1 tablet by mouth daily.    . rivaroxaban (XARELTO) 20 MG TABS tablet Take 1 tablet (20 mg total) by mouth  daily. 90 tablet 3   No current facility-administered medications for this visit.     Allergies:   Indomethacin    Social History:  The patient  reports that he quit smoking about a year ago. His smoking use included Cigarettes. He has a 7.50 pack-year smoking history. He has never used smokeless tobacco. He reports that he does not drink alcohol or use drugs.   Family History:  The patient's family history includes Hypertension in his father and mother; Stroke in his mother.    ROS:  Please see the history of present illness.   Otherwise, review of systems are positive for trying to lose weight; making dietary changes; lost 4 lbs in the past week intentionally.   All other systems are reviewed and negative.    PHYSICAL EXAM: VS:  BP 120/62 (BP Location: Left Arm, Patient  Position: Sitting, Cuff Size: Normal)   Pulse 68   Ht 5\' 10"  (1.778 m)   Wt 169 lb 12.8 oz (77 kg)   BMI 24.36 kg/m  , BMI Body mass index is 24.36 kg/m. GEN: Well nourished, well developed, in no acute distress  HEENT: normal  Neck: no JVD, carotid bruits, or masses Cardiac: irregularly irregular; no murmurs, rubs, or gallops,no edema  Respiratory:  clear to auscultation bilaterally, normal work of breathing GI: soft, nontender, nondistended, + BS MS: no deformity or atrophy  Skin: warm and dry, no rash Neuro:  Strength and sensation are intact Psych: euthymic mood, full affect   EKG:   The ekg ordered today demonstrates NSR, no ST segment changes   Recent Labs: No results found for requested labs within last 8760 hours.   Lipid Panel    Component Value Date/Time   CHOL 175 10/08/2013 0241   TRIG 114 10/08/2013 0241   HDL 41 10/08/2013 0241   CHOLHDL 4.3 10/08/2013 0241   VLDL 23 10/08/2013 0241   LDLCALC 111 (H) 10/08/2013 0241     Other studies Reviewed: Additional studies/ records that were reviewed today with results demonstrating: echo from 2015 reviewed.  EF 45-50%.   ASSESSMENT AND PLAN:  1. AFib/atrial flutter: back in NSR now.  COntinue Xarelto or stroke prevention.  Stressed importance of taking it with his biggest meal of the day. He had a CVA in 2015, related to his atrial arrhythmia.  If he has more frequent palpitations, he will let us know and we can schedule a 30 day event monitor.  I'm not convinced dizziness he had was related to any type of arrhythmia. I encouraged him to stay well hydrated and to eat on a regular basis to avoid any drops in his blood sugar. 2. Hyperlipidemia: Continue atorvastatin.  LDL target < 100 due to prior CVA.  Followed by primary care physician, Dr. Benedetto Stevens. 3. Cardiomyopathy-Abnormal echo at the time of his stroke.  No signs of CHF.  Asymptomatic- no fluid retention.  Holding off on ischemia evaluation given no symptoms  of chest discomfort.  He will let us know If he has symptoms.  No need for repeat echo at this time. 4. Anticoagulated: No bleeding problems. He will let us know if this changes.   Current medicines are reviewed at length with the patient today.  The patient concerns regarding his medicines were addressed.  The following changes have been made:  No change  Labs/ tests ordered today include:   No orders of the defined types were placed in this encounter.   Recommend 150 minutes/week of aerobic  exercise Low fat, low carb, high fiber diet recommended  Disposition:   FU in 1 year   Signed, Lance Muss, MD  07/13/2016 9:31 AM    [facility] [address] [place], [place], [place]  [address] Phone: [phone]; Fax: [phone]

## 2016-07-13 ENCOUNTER — Encounter (INDEPENDENT_AMBULATORY_CARE_PROVIDER_SITE_OTHER): Payer: Self-pay

## 2016-07-13 ENCOUNTER — Ambulatory Visit (INDEPENDENT_AMBULATORY_CARE_PROVIDER_SITE_OTHER): Payer: Medicare Other | Admitting: Interventional Cardiology

## 2016-07-13 ENCOUNTER — Encounter: Payer: Self-pay | Admitting: Interventional Cardiology

## 2016-07-13 VITALS — BP 120/62 | HR 68 | Ht 70.0 in | Wt 169.8 lb

## 2016-07-13 DIAGNOSIS — R42 Dizziness and giddiness: Secondary | ICD-10-CM

## 2016-07-13 DIAGNOSIS — I6310 Cerebral infarction due to embolism of unspecified precerebral artery: Secondary | ICD-10-CM | POA: Diagnosis not present

## 2016-07-13 DIAGNOSIS — R931 Abnormal findings on diagnostic imaging of heart and coronary circulation: Secondary | ICD-10-CM

## 2016-07-13 DIAGNOSIS — I480 Paroxysmal atrial fibrillation: Secondary | ICD-10-CM | POA: Diagnosis not present

## 2016-07-13 DIAGNOSIS — Z7901 Long term (current) use of anticoagulants: Secondary | ICD-10-CM

## 2016-07-13 DIAGNOSIS — E782 Mixed hyperlipidemia: Secondary | ICD-10-CM | POA: Diagnosis not present

## 2016-07-13 NOTE — Patient Instructions (Signed)

## 2016-07-18 ENCOUNTER — Other Ambulatory Visit: Payer: Self-pay | Admitting: Interventional Cardiology

## 2016-07-18 NOTE — Telephone Encounter (Signed)
Pt brought in copy of lab work that was done on 04/20/16 at PCP office. Serum creatine was 1.34, H&H were 16.0 & 47.3. 55.07 CrCl. Continue patient on 20mg  Xarelto.

## 2016-07-18 NOTE — Telephone Encounter (Signed)
Pt last saw Dr Eldridge Dace 07/13/16, last labs in Epic 11/16/14 Creat 1.34, pt needs BMP and CBC done.  Based on last Creat 1.34, CrCl 55.07.  Pt's age 71, weight 77kg, pt is currently on Xarelto 20mg  QD.  Attempted to call pt, LMOM TCB needs to have labwork done to ensure pt is on appropriate dosage of Xarelto.

## 2016-08-06 ENCOUNTER — Other Ambulatory Visit: Payer: Self-pay | Admitting: Interventional Cardiology

## 2017-05-16 DIAGNOSIS — Z8673 Personal history of transient ischemic attack (TIA), and cerebral infarction without residual deficits: Secondary | ICD-10-CM | POA: Insufficient documentation

## 2017-07-11 ENCOUNTER — Other Ambulatory Visit: Payer: Self-pay | Admitting: Interventional Cardiology

## 2017-07-11 NOTE — Telephone Encounter (Signed)
Age 72 years Wt 77 kg 07/13/2016 Saw Dr Eldridge Dace on 07/13/2016 and has an appt to see him on 07/24/2017 Labs at Dr Merlyn Albert Wilson's office  05/16/2017 SrCr 1.15 Hgb 15.1 HCT 43.3  Refill done for Xarelto 20 mg daily as requested

## 2017-07-21 ENCOUNTER — Other Ambulatory Visit: Payer: Self-pay | Admitting: Interventional Cardiology

## 2017-07-24 ENCOUNTER — Encounter: Payer: Self-pay | Admitting: Interventional Cardiology

## 2017-07-24 ENCOUNTER — Ambulatory Visit: Payer: Medicare Other | Admitting: Interventional Cardiology

## 2017-07-24 VITALS — BP 136/64 | Ht 70.0 in | Wt 180.0 lb

## 2017-07-24 DIAGNOSIS — E782 Mixed hyperlipidemia: Secondary | ICD-10-CM | POA: Diagnosis not present

## 2017-07-24 DIAGNOSIS — Z7901 Long term (current) use of anticoagulants: Secondary | ICD-10-CM | POA: Diagnosis not present

## 2017-07-24 DIAGNOSIS — I4821 Permanent atrial fibrillation: Secondary | ICD-10-CM

## 2017-07-24 DIAGNOSIS — I482 Chronic atrial fibrillation: Secondary | ICD-10-CM | POA: Diagnosis not present

## 2017-07-24 MED ORDER — METOPROLOL TARTRATE 25 MG PO TABS: ORAL_TABLET | ORAL | 3 refills | Status: DC

## 2017-07-24 MED ORDER — RIVAROXABAN 20 MG PO TABS: ORAL_TABLET | ORAL | 1 refills | Status: DC

## 2017-07-24 MED ORDER — ATORVASTATIN CALCIUM 10 MG PO TABS: ORAL_TABLET | ORAL | 3 refills | Status: DC

## 2017-07-24 NOTE — Patient Instructions (Signed)

## 2017-07-24 NOTE — Progress Notes (Signed)
Cardiology Office Note   Date:  07/24/2017   ID:  [facility], DOB 1945/05/08, MRN [id number]  PCP:  Delorse Lek, MD    No chief complaint on file.  CVA, AFib  Wt Readings from Last 3 Encounters:  07/24/17 180 lb (81.6 kg)  07/13/16 169 lb 12.8 oz (77 kg)  07/20/15 190 lb (86.2 kg)       History of Present Illness: [facility] Stevens is a 72 y.o. male  who had a CVA. He had AFib dagnosed in the hospital at that time. He was started on anticoagulation with Xarelto at that time. He has done well since then. He denies any palpitations or chest discomfort. He has not been exercising much. Much of the neurologic symptoms that he had at the time of his CVA have resolved.   Of note, there is a question of a mildly decreased left ventricular ejection fraction. The TEE on the following day in the hospital showed normal left ventricular ejection fraction. He did not have any ischemia evaluation due to lack of symptoms and resolution of LV dysfunction. This may have been tachycardia mediated.  No bleeding problems.  Exercising 3 days /week.  Doing well.  Denies : Chest pain. Dizziness. Leg edema. Nitroglycerin use. Orthopnea. Palpitations. Paroxysmal nocturnal dyspnea. Shortness of breath. Syncope.      Past Medical History:  Diagnosis Date  . Atrial flutter (HCC)   . 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 Diabetes mellitus without complication (HCC)   . Gout   . HLD (hyperlipidemia)   . Hx of echocardiogram    a. ECHO 10/08/13: EF 45-50%, mild hypokinesis of apical myocardium. Mild LA dilation, mild RA dilation;  b.  TEE (10/09/13):  EF 50-55%, normal wall motion, no LAA clot  . Stroke [facility])     Past Surgical History:  Procedure Laterality Date  . TEE WITHOUT CARDIOVERSION N/A 10/09/2013   Procedure: TRANSESOPHAGEAL ECHOCARDIOGRAM (TEE);  Surgeon: Chrystie Nose, MD;  Location: [facility] ENDOSCOPY;  Service: Cardiovascular;  Laterality: N/A;     Current  Outpatient Medications  Medication Sig Dispense Refill  . allopurinol (ZYLOPRIM) 300 MG tablet Take 1 tablet by mouth daily.    Marland Kitchen atorvastatin (LIPITOR) 10 MG tablet TAKE 1 TABLET BY MOUTH DAILY AT 6PM 90 tablet 3  . COENZYME Q10 PO Take 1 tablet by mouth daily.     Marland Kitchen GLUCOSAMINE-CHONDROITIN PO Take 1 tablet by mouth daily.     . metoprolol tartrate (LOPRESSOR) 25 MG tablet TAKE 1/2 TABLET(12.5 MG) BY MOUTH TWICE DAILY 90 tablet 3  . Multiple Vitamin (MULTIVITAMIN) tablet Take 1 tablet by mouth daily.    . Omega-3 Fatty Acids (FISH OIL PO) Take 2 tablets by mouth daily.    Carlena Hurl 20 MG TABS tablet TAKE 1 TABLET(20 MG) BY MOUTH DAILY 90 tablet 1   No current facility-administered medications for this visit.     Allergies:   Indomethacin    Social History:  The patient  reports that he quit smoking about 2 years ago. His smoking use included cigarettes. He has a 7.50 pack-year smoking history. He has never used smokeless tobacco. He reports that he does not drink alcohol or use drugs.   Family History:  The patient's family history includes Hypertension in his father and mother; Stroke in his mother.    ROS:  Please see the history of present illness.   Otherwise, review of systems are positive for difficulty losing  weight.   All other systems are reviewed and negative.    PHYSICAL EXAM: VS:  BP 136/64   Ht 5\' 10"  (1.778 m)   Wt 180 lb (81.6 kg)   BMI 25.83 kg/m  , BMI Body mass index is 25.83 kg/m. GEN: Well nourished, well developed, in no acute distress  HEENT: normal  Neck: no JVD, carotid bruits, or masses Cardiac: irregular irregular, normal rate; no murmurs, rubs, or gallops,no edema  Respiratory:  clear to auscultation bilaterally, normal work of breathing GI: soft, nontender, nondistended, + BS MS: no deformity or atrophy  Skin: warm and dry, no rash Neuro:  Strength and sensation are intact Psych: euthymic mood, full affect   EKG:   The ekg ordered today  demonstrates AFib, no ST changes, rate controlled   Recent Labs: No results found for requested labs within last 8760 hours.   Lipid Panel    Component Value Date/Time   CHOL 175 10/08/2013 0241   TRIG 114 10/08/2013 0241   HDL 41 10/08/2013 0241   CHOLHDL 4.3 10/08/2013 0241   VLDL 23 10/08/2013 0241   LDLCALC 111 (H) 10/08/2013 0241     Other studies Reviewed: Additional studies/ records that were reviewed today with results demonstrating: LDL 59, Cr 1.15 in 2/19.     ASSESSMENT AND PLAN:  1. AFib/Atrial flutter: Rate controlled.  Continue Xarelto.  CBC, Cr ok to continue anticoagulation.  2. Hyperlipidemia:  Continue current lipid lowering therapy. 3. Cardiomyopathy: Mild, diagnosed at time of stroke.  Held off on ischemic evaluation due to lack of sx.  Currently no evidence of CHF.    Current medicines are reviewed at length with the patient today.  The patient concerns regarding his medicines were addressed.  The following changes have been made:  No change  Labs/ tests ordered today include:  No orders of the defined types were placed in this encounter.   Recommend 150 minutes/week of aerobic exercise Low fat, low carb, high fiber diet recommended  Disposition:   FU in 1 year   Signed, Lance Muss, MD  07/24/2017 3:18 PM    [facility] [address] [place], [place], [place]  [address] Phone: [phone]; Fax: [phone]

## 2017-08-27 ENCOUNTER — Other Ambulatory Visit: Payer: Self-pay | Admitting: Family Medicine

## 2017-08-27 ENCOUNTER — Ambulatory Visit
Admission: RE | Admit: 2017-08-27 | Discharge: 2017-08-27 | Disposition: A | Discharge status: Home / Self Care | Payer: Medicare Other | Source: Ambulatory Visit | Attending: Family Medicine | Admitting: Family Medicine

## 2017-08-27 DIAGNOSIS — J40 Bronchitis, not specified as acute or chronic: Secondary | ICD-10-CM

## 2017-08-27 DIAGNOSIS — J441 Chronic obstructive pulmonary disease with (acute) exacerbation: Secondary | ICD-10-CM | POA: Insufficient documentation

## 2017-10-12 DIAGNOSIS — M5431 Sciatica, right side: Secondary | ICD-10-CM | POA: Insufficient documentation

## 2017-10-16 ENCOUNTER — Other Ambulatory Visit: Payer: Self-pay | Admitting: Family Medicine

## 2017-10-16 DIAGNOSIS — M545 Low back pain: Secondary | ICD-10-CM

## 2017-10-22 ENCOUNTER — Ambulatory Visit
Admission: RE | Admit: 2017-10-22 | Discharge: 2017-10-22 | Disposition: A | Discharge status: Home / Self Care | Payer: Medicare Other | Source: Ambulatory Visit | Attending: Family Medicine | Admitting: Family Medicine

## 2017-10-22 DIAGNOSIS — M545 Low back pain: Secondary | ICD-10-CM

## 2017-11-16 DIAGNOSIS — Z79899 Other long term (current) drug therapy: Secondary | ICD-10-CM | POA: Insufficient documentation

## 2017-11-16 DIAGNOSIS — Z1211 Encounter for screening for malignant neoplasm of colon: Secondary | ICD-10-CM | POA: Insufficient documentation

## 2017-11-16 DIAGNOSIS — I621 Nontraumatic extradural hemorrhage: Secondary | ICD-10-CM | POA: Insufficient documentation

## 2018-05-06 ENCOUNTER — Other Ambulatory Visit: Payer: Self-pay | Admitting: Interventional Cardiology

## 2018-05-06 NOTE — Telephone Encounter (Signed)
Xarelto 20mg  refill request received; pt is 73 yrs old, wt-81.6kg, Crea-1.15 on 05/16/17, last seen by Dr. Eldridge Dace on 07/24/2017, CrCl-67.65ml/min; will send in refill to requested pharmacy.

## 2018-05-23 ENCOUNTER — Telehealth: Payer: Self-pay | Admitting: *Deleted

## 2018-05-23 NOTE — Telephone Encounter (Signed)
Patient with diagnosis of afib on Xarelto for anticoagulation.    Procedure: COLONOSCOPY  Date of procedure: 06/13/2018  CHADS2-VASc score of  5 (CHF, HTN, AGE, DM2, stroke/tia x 2, CAD, AGE, male) Due to patient history of a stroke he is at high risk off anticoagulation  Per office protocol, patient can hold Xarelto for 1 days prior to procedure.

## 2018-05-23 NOTE — Telephone Encounter (Signed)
   [facility] Pre-operative Risk Assessment    Request for surgical clearance:  1. What type of surgery is being performed? COLONOSCOPY   2. When is this surgery scheduled? 06/13/18   3. What type of clearance is required (medical clearance vs. Pharmacy clearance to hold med vs. Both)? BOTH  4. Are there any medications that need to be held prior to surgery and how long?[place]   5. Practice name and name of physician performing surgery? [facility]; DR. HUNG  6. What is your office phone number [phone]    7.   What is your office fax number [phone]  8.   Anesthesia type (None, local, MAC, general) ? PROPOFOL   Julaine Hua 05/23/2018, 12:22 PM  _________________________________________________________________   (provider comments below)

## 2018-05-23 NOTE — Telephone Encounter (Signed)
   Primary Cardiologist: Lance Muss, MD  Chart reviewed as part of pre-operative protocol coverage. Patient was contacted 05/23/2018 in reference to pre-operative risk assessment for pending surgery as outlined below.  [facility] was last seen on 07/24/17  By Dr. Eldridge Dace .  Since that day, [facility] has done well w/o any cardiac symptoms.   Therefore, based on ACC/AHA guidelines, the patient would be at acceptable risk for the planned procedure without further cardiovascular testing.   Pharmacy's recs regarding Xarelto to follow.   I will route this recommendation to the requesting party via Epic fax function and remove from pre-op pool.  Please call with questions.  Robbie Lis, PA-C 05/23/2018, 1:37 PM

## 2018-07-17 ENCOUNTER — Telehealth: Payer: Self-pay

## 2018-07-17 NOTE — Telephone Encounter (Signed)
Left message for patient to call back to discuss transitioning f/u appt to virtual visit.

## 2018-07-18 NOTE — Telephone Encounter (Signed)
Virtual Visit Pre-Appointment Phone Call  TELEPHONE CALL NOTE  Zhi Chapla has been deemed a candidate for a follow-up tele-health visit to limit community exposure during the Covid-19 pandemic. I spoke with the patient via phone to ensure availability of phone/video source, confirm preferred email & phone number, and discuss instructions and expectations.  I reminded Nahun Cyphert to be prepared with any vital sign and/or heart rhythm information that could potentially be obtained via home monitoring, at the time of his visit. I reminded Shamone Lucatero to expect a phone call prior to his visit.   Patient agrees to consent below.   Lattie Haw, RN 07/18/2018 2:12 PM   FULL LENGTH CONSENT FOR TELE-HEALTH VISIT   I hereby voluntarily request, consent and authorize [facility] and its employed or contracted physicians, physician assistants, nurse practitioners or other licensed health care professionals (the Practitioner), to provide me with telemedicine health care services (the "Services") as deemed necessary by the treating Practitioner. I acknowledge and consent to receive the Services by the Practitioner via telemedicine. I understand that the telemedicine visit will involve communicating with the Practitioner through live audiovisual communication technology and the disclosure of certain medical information by electronic transmission. I acknowledge that I have been given the opportunity to request an in-person assessment or other available alternative prior to the telemedicine visit and am voluntarily participating in the telemedicine visit.  I understand that I have the right to withhold or withdraw my consent to the use of telemedicine in the course of my care at any time, without affecting my right to future care or treatment, and that the Practitioner or I may terminate the telemedicine visit at any time. I understand that I have the right to inspect all information  obtained and/or recorded in the course of the telemedicine visit and may receive copies of available information for a reasonable fee.  I understand that some of the potential risks of receiving the Services via telemedicine include:  Marland Kitchen Delay or interruption in medical evaluation due to technological equipment failure or disruption; . Information transmitted may not be sufficient (e.g. poor resolution of images) to allow for appropriate medical decision making by the Practitioner; and/or  . In rare instances, security protocols could fail, causing a breach of personal health information.  Furthermore, I acknowledge that it is my responsibility to provide information about my medical history, conditions and care that is complete and accurate to the best of my ability. I acknowledge that Practitioner's advice, recommendations, and/or decision may be based on factors not within their control, such as incomplete or inaccurate data provided by me or distortions of diagnostic images or specimens that may result from electronic transmissions. I understand that the practice of medicine is not an exact science and that Practitioner makes no warranties or guarantees regarding treatment outcomes. I acknowledge that I will receive a copy of this consent concurrently upon execution via email to the email address I last provided but may also request a printed copy by calling the office of [facility].    I understand that my insurance will be billed for this visit.   I have read or had this consent read to me. . I understand the contents of this consent, which adequately explains the benefits and risks of the Services being provided via telemedicine.  . I have been provided ample opportunity to ask questions regarding this consent and the Services and have had my questions answered to my satisfaction. . I give  my informed consent for the services to be provided through the use of telemedicine in my medical care   By participating in this telemedicine visit I agree to the above.

## 2018-07-18 NOTE — Telephone Encounter (Signed)
Pt returned call about Virtual appt. He informed me he would prefer a video chat on his iPhone over a WebEx conference call. He verified that the phone number in his chart is a cell phone, and can receive text messages.  His only concern with the appointment had to do with Lab work. He has not had any blood work done in at least 6 months, neither with Dr. Eldridge Dace nor his PCP. He wants to know if Dr. Eldridge Dace would be ordering the lab work during his appt, or if he needs to go and get the lab work done prior to his appt.   He is also understanding that the time of his appt may change to accommodate the  Virtual visit.  Please reach out to him about the labs and the possible change in appt time.

## 2018-07-23 NOTE — Progress Notes (Signed)
Virtual Visit via Video Note   This visit type was conducted due to national recommendations for restrictions regarding the COVID-19 Pandemic (e.g. social distancing) in an effort to limit this patient's exposure and mitigate transmission in our community.  Due to his co-morbid illnesses, this patient is at least at moderate risk for complications without adequate follow up.  This format is felt to be most appropriate for this patient at this time.  All issues noted in this document were discussed and addressed.  A limited physical exam was performed with this format.  Please refer to the patient's chart for his consent to telehealth for [facility]   Evaluation Performed:  Follow-up visit  Date:  07/24/2018   ID:  Jonathon Stevens, DOB 11/24/1945, MRN [id number]  Patient Location: Home Provider Location: Home  PCP:  Delorse Lek, MD  Cardiologist:  Lance Muss, MD  Electrophysiologist:  None   Chief Complaint:  AFib  History of Present Illness:    Jonathon Stevens is a 73 y.o. male who had a CVA. He had AFib dagnosed in the hospital at that time. He was started on anticoagulation with Xarelto at that time.   Much of the neurologic symptoms that he had at the time of his CVA have resolved.   Of note, there was a question of a mildly decreased left ventricular ejection fraction. The TEE on the following day in the hospital showed normal left ventricular ejection fraction. He did not have any ischemia evaluation due to lack of symptoms and resolution of LV dysfunction. This may have been tachycardia mediated.  He is back to work.   Walks regularly.    Denies : Chest pain. Dizziness. Leg edema. Nitroglycerin use. Orthopnea. Palpitations. Paroxysmal nocturnal dyspnea. Shortness of breath. Syncope.   The patient does not have symptoms concerning for COVID-19 infection (fever, chills, cough, or new shortness of breath).    Past Medical History:  Diagnosis Date  . Atrial  flutter (HCC)   . 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 Diabetes mellitus without complication (HCC)   . Gout   . HLD (hyperlipidemia)   . Hx of echocardiogram    a. ECHO 10/08/13: EF 45-50%, mild hypokinesis of apical myocardium. Mild LA dilation, mild RA dilation;  b.  TEE (10/09/13):  EF 50-55%, normal wall motion, no LAA clot  . Stroke [facility])    Past Surgical History:  Procedure Laterality Date  . TEE WITHOUT CARDIOVERSION N/A 10/09/2013   Procedure: TRANSESOPHAGEAL ECHOCARDIOGRAM (TEE);  Surgeon: Chrystie Nose, MD;  Location: [facility] ENDOSCOPY;  Service: Cardiovascular;  Laterality: N/A;     Current Meds  Medication Sig  . allopurinol (ZYLOPRIM) 300 MG tablet Take 1 tablet by mouth daily.  Marland Kitchen atorvastatin (LIPITOR) 10 MG tablet TAKE 1 TABLET BY MOUTH DAILY AT 6PM  . COENZYME Q10 PO Take 1 tablet by mouth daily.   Marland Kitchen GLUCOSAMINE-CHONDROITIN PO Take 1 tablet by mouth daily.   . metoprolol tartrate (LOPRESSOR) 25 MG tablet TAKE 1/2 TABLET(12.5 MG) BY MOUTH TWICE DAILY  . Multiple Vitamin (MULTIVITAMIN) tablet Take 1 tablet by mouth daily.  . Omega-3 Fatty Acids (FISH OIL PO) Take 2 tablets by mouth daily.  Carlena Hurl 20 MG TABS tablet TAKE 1 TABLET BY MOUTH DAILY     Allergies:   Indomethacin   Social History   Tobacco Use  . Smoking status: Former Smoker    Packs/day: 0.50    Years: 15.00    Pack  years: 7.50    Types: Cigarettes    Last attempt to quit: 07/13/2015    Years since quitting: 3.0  . Smokeless tobacco: Never Used  Substance Use Topics  . Alcohol use: No    Alcohol/week: 0.0 standard drinks  . Drug use: No     Family Hx: The patient's family history includes Hypertension in his father and mother; Stroke in his mother. There is no history of Heart attack.  ROS:   Please see the history of present illness.    Mild joint pains All other systems reviewed and are negative.   Prior CV studies:   The following studies were reviewed today:  ECG,  labs  Labs/Other Tests and Data Reviewed:    EKG:  An ECG dated 4/19 was personally reviewed today and demonstrated:  AFib, rate controlled  Recent Labs: No results found for requested labs within last 8760 hours.   Recent Lipid Panel Lab Results  Component Value Date/Time   CHOL 175 10/08/2013 02:41 AM   TRIG 114 10/08/2013 02:41 AM   HDL 41 10/08/2013 02:41 AM   CHOLHDL 4.3 10/08/2013 02:41 AM   LDLCALC 111 (H) 10/08/2013 02:41 AM    Wt Readings from Last 3 Encounters:  07/24/18 185 lb (83.9 kg)  07/24/17 180 lb (81.6 kg)  07/13/16 169 lb 12.8 oz (77 kg)     Objective:    Vital Signs:  Ht 5\' 10"  (1.778 m)   Wt 185 lb (83.9 kg)   BMI 26.54 kg/m    VITAL SIGNS:  reviewed GEN:  no acute distress RESPIRATORY:  normal respiratory effort, symmetric expansion PSYCH:  normal affect exam limited due to video format  ASSESSMENT & PLAN:    1. AFib/Atrial flutter: Rate control strategy.  Xarelto for stroke prevention.  Uses apple watch.  HR has been controlled.  No bleeding problems.  2. Hyperlipidemia: Labs to be checked in August with Dr. Andrey Campanile.   COntinue cuurent lipid lowering therapy.  COntrolled in 2/19. 3. Cardiomyopathy: No signs of CHF.  Appears euvolemic.  4. Anticoagulated: No bleeding issues.   COVID-19 Education: The signs and symptoms of COVID-19 were discussed with the patient and how to seek care for testing (follow up with PCP or arrange E-visit).  The importance of social distancing was discussed today.  Time:   Today, I have spent 25 minutes with the patient with telehealth technology discussing the above problems.     Medication Adjustments/Labs and Tests Ordered: Current medicines are reviewed at length with the patient today.  Concerns regarding medicines are outlined above.   Tests Ordered: No orders of the defined types were placed in this encounter.   Medication Changes: No orders of the defined types were placed in this encounter.    Disposition:  Follow up in 1 year(s)  Signed, Lance Muss, MD  07/24/2018 9:34 AM    [facility]

## 2018-07-24 ENCOUNTER — Telehealth (INDEPENDENT_AMBULATORY_CARE_PROVIDER_SITE_OTHER): Payer: Medicare Other | Admitting: Interventional Cardiology

## 2018-07-24 ENCOUNTER — Other Ambulatory Visit: Payer: Self-pay

## 2018-07-24 ENCOUNTER — Encounter: Payer: Self-pay | Admitting: Interventional Cardiology

## 2018-07-24 VITALS — Ht 70.0 in | Wt 185.0 lb

## 2018-07-24 DIAGNOSIS — I4821 Permanent atrial fibrillation: Secondary | ICD-10-CM

## 2018-07-24 DIAGNOSIS — E782 Mixed hyperlipidemia: Secondary | ICD-10-CM

## 2018-07-24 DIAGNOSIS — Z7901 Long term (current) use of anticoagulants: Secondary | ICD-10-CM

## 2018-07-24 MED ORDER — METOPROLOL TARTRATE 25 MG PO TABS: ORAL_TABLET | ORAL | 3 refills | Status: DC

## 2018-07-24 MED ORDER — RIVAROXABAN 20 MG PO TABS: 20.0000 mg | ORAL_TABLET | Freq: Every day | ORAL | 1 refills | Status: DC

## 2018-07-24 MED ORDER — ATORVASTATIN CALCIUM 10 MG PO TABS: ORAL_TABLET | ORAL | 3 refills | Status: DC

## 2018-07-24 NOTE — Patient Instructions (Signed)

## 2019-03-30 ENCOUNTER — Encounter (HOSPITAL_COMMUNITY): Payer: Self-pay

## 2019-03-30 ENCOUNTER — Inpatient Hospital Stay (HOSPITAL_COMMUNITY)
Admission: EM | Admit: 2019-03-30 | Discharge: 2019-04-10 | DRG: 308 | Disposition: A | Discharge status: Home / Self Care | Payer: Medicare Other | Attending: Family Medicine | Admitting: Family Medicine

## 2019-03-30 ENCOUNTER — Other Ambulatory Visit: Payer: Self-pay

## 2019-03-30 ENCOUNTER — Emergency Department (HOSPITAL_COMMUNITY): Payer: Medicare Other

## 2019-03-30 DIAGNOSIS — Z7901 Long term (current) use of anticoagulants: Secondary | ICD-10-CM

## 2019-03-30 DIAGNOSIS — I4892 Unspecified atrial flutter: Secondary | ICD-10-CM | POA: Diagnosis present

## 2019-03-30 DIAGNOSIS — M109 Gout, unspecified: Secondary | ICD-10-CM | POA: Diagnosis present

## 2019-03-30 DIAGNOSIS — I482 Chronic atrial fibrillation, unspecified: Secondary | ICD-10-CM | POA: Diagnosis not present

## 2019-03-30 DIAGNOSIS — E871 Hypo-osmolality and hyponatremia: Secondary | ICD-10-CM | POA: Diagnosis present

## 2019-03-30 DIAGNOSIS — R0602 Shortness of breath: Secondary | ICD-10-CM

## 2019-03-30 DIAGNOSIS — I4891 Unspecified atrial fibrillation: Secondary | ICD-10-CM | POA: Diagnosis present

## 2019-03-30 DIAGNOSIS — U071 COVID-19: Secondary | ICD-10-CM

## 2019-03-30 DIAGNOSIS — Z8249 Family history of ischemic heart disease and other diseases of the circulatory system: Secondary | ICD-10-CM

## 2019-03-30 DIAGNOSIS — Z8673 Personal history of transient ischemic attack (TIA), and cerebral infarction without residual deficits: Secondary | ICD-10-CM

## 2019-03-30 DIAGNOSIS — F05 Delirium due to known physiological condition: Secondary | ICD-10-CM | POA: Diagnosis present

## 2019-03-30 DIAGNOSIS — I480 Paroxysmal atrial fibrillation: Secondary | ICD-10-CM | POA: Diagnosis not present

## 2019-03-30 DIAGNOSIS — R7303 Prediabetes: Secondary | ICD-10-CM | POA: Diagnosis present

## 2019-03-30 DIAGNOSIS — E785 Hyperlipidemia, unspecified: Secondary | ICD-10-CM | POA: Diagnosis present

## 2019-03-30 DIAGNOSIS — R739 Hyperglycemia, unspecified: Secondary | ICD-10-CM | POA: Diagnosis not present

## 2019-03-30 DIAGNOSIS — Z888 Allergy status to other drugs, medicaments and biological substances status: Secondary | ICD-10-CM

## 2019-03-30 DIAGNOSIS — N179 Acute kidney failure, unspecified: Secondary | ICD-10-CM | POA: Diagnosis present

## 2019-03-30 DIAGNOSIS — T380X5A Adverse effect of glucocorticoids and synthetic analogues, initial encounter: Secondary | ICD-10-CM | POA: Diagnosis not present

## 2019-03-30 DIAGNOSIS — G9341 Metabolic encephalopathy: Secondary | ICD-10-CM | POA: Diagnosis present

## 2019-03-30 DIAGNOSIS — N390 Urinary tract infection, site not specified: Secondary | ICD-10-CM | POA: Diagnosis present

## 2019-03-30 DIAGNOSIS — R197 Diarrhea, unspecified: Secondary | ICD-10-CM | POA: Diagnosis present

## 2019-03-30 DIAGNOSIS — J9601 Acute respiratory failure with hypoxia: Secondary | ICD-10-CM | POA: Diagnosis present

## 2019-03-30 DIAGNOSIS — Z79899 Other long term (current) drug therapy: Secondary | ICD-10-CM

## 2019-03-30 DIAGNOSIS — B958 Unspecified staphylococcus as the cause of diseases classified elsewhere: Secondary | ICD-10-CM | POA: Diagnosis present

## 2019-03-30 DIAGNOSIS — Z823 Family history of stroke: Secondary | ICD-10-CM

## 2019-03-30 DIAGNOSIS — J1282 Pneumonia due to coronavirus disease 2019: Secondary | ICD-10-CM | POA: Diagnosis present

## 2019-03-30 DIAGNOSIS — Z87891 Personal history of nicotine dependence: Secondary | ICD-10-CM

## 2019-03-30 DIAGNOSIS — Z9981 Dependence on supplemental oxygen: Secondary | ICD-10-CM

## 2019-03-30 HISTORY — DX: COVID-19: U07.1

## 2019-03-30 LAB — CBC WITH DIFFERENTIAL/PLATELET
Abs Immature Granulocytes: 0.01 10*3/uL (ref 0.00–0.07)
Basophils Absolute: 0 10*3/uL (ref 0.0–0.1)
Basophils Relative: 0 %
Eosinophils Absolute: 0 10*3/uL (ref 0.0–0.5)
Eosinophils Relative: 0 %
HCT: 46 % (ref 39.0–52.0)
Hemoglobin: 15.5 g/dL (ref 13.0–17.0)
Immature Granulocytes: 0 %
Lymphocytes Relative: 10 %
Lymphs Abs: 0.5 10*3/uL — ABNORMAL LOW (ref 0.7–4.0)
MCH: 30.9 pg (ref 26.0–34.0)
MCHC: 33.7 g/dL (ref 30.0–36.0)
MCV: 91.8 fL (ref 80.0–100.0)
Monocytes Absolute: 0.4 10*3/uL (ref 0.1–1.0)
Monocytes Relative: 8 %
Neutro Abs: 4.5 10*3/uL (ref 1.7–7.7)
Neutrophils Relative %: 82 %
Platelets: 133 10*3/uL — ABNORMAL LOW (ref 150–400)
RBC: 5.01 MIL/uL (ref 4.22–5.81)
RDW: 13.1 % (ref 11.5–15.5)
WBC: 5.5 10*3/uL (ref 4.0–10.5)
nRBC: 0 % (ref 0.0–0.2)

## 2019-03-30 LAB — COMPREHENSIVE METABOLIC PANEL
ALT: 30 U/L (ref 0–44)
AST: 35 U/L (ref 15–41)
Albumin: 4.1 g/dL (ref 3.5–5.0)
Alkaline Phosphatase: 76 U/L (ref 38–126)
Anion gap: 12 (ref 5–15)
BUN: 18 mg/dL (ref 8–23)
CO2: 22 mmol/L (ref 22–32)
Calcium: 8.7 mg/dL — ABNORMAL LOW (ref 8.9–10.3)
Chloride: 97 mmol/L — ABNORMAL LOW (ref 98–111)
Creatinine, Ser: 1.35 mg/dL — ABNORMAL HIGH (ref 0.61–1.24)
GFR calc Af Amer: 60 mL/min — ABNORMAL LOW (ref >60)
GFR calc non Af Amer: 52 mL/min — ABNORMAL LOW (ref >60)
Glucose, Bld: 169 mg/dL — ABNORMAL HIGH (ref 70–99)
Potassium: 3.9 mmol/L (ref 3.5–5.1)
Sodium: 131 mmol/L — ABNORMAL LOW (ref 135–145)
Total Bilirubin: 0.8 mg/dL (ref 0.3–1.2)
Total Protein: 7.6 g/dL (ref 6.5–8.1)

## 2019-03-30 LAB — LACTIC ACID, PLASMA: Lactic Acid, Venous: 1.3 mmol/L (ref 0.5–1.9)

## 2019-03-30 LAB — I-STAT CHEM 8, ED
BUN: 18 mg/dL (ref 8–23)
Calcium, Ion: 1.1 mmol/L — ABNORMAL LOW (ref 1.15–1.40)
Chloride: 96 mmol/L — ABNORMAL LOW (ref 98–111)
Creatinine, Ser: 1.4 mg/dL — ABNORMAL HIGH (ref 0.61–1.24)
Glucose, Bld: 165 mg/dL — ABNORMAL HIGH (ref 70–99)
HCT: 47 % (ref 39.0–52.0)
Hemoglobin: 16 g/dL (ref 13.0–17.0)
Potassium: 3.7 mmol/L (ref 3.5–5.1)
Sodium: 132 mmol/L — ABNORMAL LOW (ref 135–145)
TCO2: 23 mmol/L (ref 22–32)

## 2019-03-30 LAB — TROPONIN I (HIGH SENSITIVITY): Troponin I (High Sensitivity): 42 ng/L — ABNORMAL HIGH (ref <18)

## 2019-03-30 LAB — FERRITIN: Ferritin: 891 ng/mL — ABNORMAL HIGH (ref 24–336)

## 2019-03-30 LAB — PROTIME-INR
INR: 1.3 — ABNORMAL HIGH (ref 0.8–1.2)
Prothrombin Time: 16.1 seconds — ABNORMAL HIGH (ref 11.4–15.2)

## 2019-03-30 LAB — LACTATE DEHYDROGENASE: LDH: 237 U/L — ABNORMAL HIGH (ref 98–192)

## 2019-03-30 LAB — PROCALCITONIN: Procalcitonin: <0.1 ng/mL

## 2019-03-30 LAB — FIBRINOGEN: Fibrinogen: 592 mg/dL — ABNORMAL HIGH (ref 210–475)

## 2019-03-30 LAB — APTT: aPTT: 37 seconds — ABNORMAL HIGH (ref 24–36)

## 2019-03-30 LAB — C-REACTIVE PROTEIN: CRP: 2 mg/dL — ABNORMAL HIGH (ref <1.0)

## 2019-03-30 LAB — BRAIN NATRIURETIC PEPTIDE: B Natriuretic Peptide: 143.2 pg/mL — ABNORMAL HIGH (ref 0.0–100.0)

## 2019-03-30 LAB — TRIGLYCERIDES: Triglycerides: 46 mg/dL (ref <150)

## 2019-03-30 LAB — POC SARS CORONAVIRUS 2 AG -  ED: SARS Coronavirus 2 Ag: POSITIVE — AB

## 2019-03-30 MED ORDER — SODIUM CHLORIDE 0.9 % IV SOLN
100.0000 mg | Freq: Every day | INTRAVENOUS | Status: AC
  Administered 2019-04-01 – 2019-04-04 (×4): 100 mg via INTRAVENOUS
  Filled 2019-03-30 (×2): qty 100
  Filled 2019-03-30: qty 20
  Filled 2019-03-30: qty 100

## 2019-03-30 MED ORDER — DILTIAZEM HCL-DEXTROSE 125-5 MG/125ML-% IV SOLN (PREMIX)
5.0000 mg/h | INTRAVENOUS | Status: DC
  Administered 2019-03-30: 5 mg/h via INTRAVENOUS
  Administered 2019-04-01 (×2): 12.5 mg/h via INTRAVENOUS
  Administered 2019-04-01: 10 mg/h via INTRAVENOUS
  Administered 2019-04-02: 12.5 mg/h via INTRAVENOUS
  Administered 2019-04-03: 5 mg/h via INTRAVENOUS
  Filled 2019-03-30 (×7): qty 125

## 2019-03-30 MED ORDER — ALBUTEROL SULFATE HFA 108 (90 BASE) MCG/ACT IN AERS
2.0000 | INHALATION_SPRAY | Freq: Four times a day (QID) | RESPIRATORY_TRACT | Status: DC | PRN
  Administered 2019-04-02: 2 via RESPIRATORY_TRACT
  Filled 2019-03-30: qty 6.7

## 2019-03-30 MED ORDER — RIVAROXABAN 20 MG PO TABS
20.0000 mg | ORAL_TABLET | Freq: Every day | ORAL | Status: DC
  Administered 2019-03-31 – 2019-04-09 (×10): 20 mg via ORAL
  Filled 2019-03-30 (×12): qty 1

## 2019-03-30 MED ORDER — ALLOPURINOL 100 MG PO TABS
300.0000 mg | ORAL_TABLET | Freq: Every day | ORAL | Status: DC
  Administered 2019-03-31 – 2019-04-10 (×11): 300 mg via ORAL
  Filled 2019-03-30 (×6): qty 3
  Filled 2019-03-30: qty 1
  Filled 2019-03-30 (×2): qty 3
  Filled 2019-03-30: qty 1
  Filled 2019-03-30: qty 3
  Filled 2019-03-30: qty 1
  Filled 2019-03-30: qty 3

## 2019-03-30 MED ORDER — SODIUM CHLORIDE 0.9 % IV SOLN
200.0000 mg | Freq: Once | INTRAVENOUS | Status: AC
  Administered 2019-03-31: 200 mg via INTRAVENOUS
  Filled 2019-03-30: qty 200

## 2019-03-30 MED ORDER — INSULIN ASPART 100 UNIT/ML ~~LOC~~ SOLN
0.0000 [IU] | Freq: Three times a day (TID) | SUBCUTANEOUS | Status: DC
  Administered 2019-03-31 – 2019-04-01 (×4): 2 [IU] via SUBCUTANEOUS
  Administered 2019-04-02: 3 [IU] via SUBCUTANEOUS
  Administered 2019-04-02: 5 [IU] via SUBCUTANEOUS
  Administered 2019-04-02: 3 [IU] via SUBCUTANEOUS
  Administered 2019-04-03: 8 [IU] via SUBCUTANEOUS
  Administered 2019-04-03: 5 [IU] via SUBCUTANEOUS
  Administered 2019-04-03 – 2019-04-04 (×2): 3 [IU] via SUBCUTANEOUS
  Administered 2019-04-04 (×2): 5 [IU] via SUBCUTANEOUS
  Administered 2019-04-05: 3 [IU] via SUBCUTANEOUS
  Administered 2019-04-05 (×2): 5 [IU] via SUBCUTANEOUS
  Administered 2019-04-06 (×2): 3 [IU] via SUBCUTANEOUS
  Administered 2019-04-06 – 2019-04-07 (×2): 5 [IU] via SUBCUTANEOUS
  Administered 2019-04-07 (×2): 3 [IU] via SUBCUTANEOUS
  Administered 2019-04-08 (×2): 2 [IU] via SUBCUTANEOUS
  Administered 2019-04-08 – 2019-04-09 (×3): 3 [IU] via SUBCUTANEOUS
  Administered 2019-04-09 – 2019-04-10 (×2): 2 [IU] via SUBCUTANEOUS
  Filled 2019-03-30: qty 0.15

## 2019-03-30 MED ORDER — INSULIN ASPART 100 UNIT/ML ~~LOC~~ SOLN
0.0000 [IU] | Freq: Every day | SUBCUTANEOUS | Status: DC
  Administered 2019-03-31: 2 [IU] via SUBCUTANEOUS
  Administered 2019-04-02: 3 [IU] via SUBCUTANEOUS
  Administered 2019-04-03: 2 [IU] via SUBCUTANEOUS
  Administered 2019-04-04: 3 [IU] via SUBCUTANEOUS
  Administered 2019-04-05: 2 [IU] via SUBCUTANEOUS
  Administered 2019-04-06: 3 [IU] via SUBCUTANEOUS
  Administered 2019-04-08: 2 [IU] via SUBCUTANEOUS
  Filled 2019-03-30: qty 0.05

## 2019-03-30 MED ORDER — ACETAMINOPHEN 325 MG PO TABS
650.0000 mg | ORAL_TABLET | Freq: Once | ORAL | Status: AC
  Administered 2019-03-30: 650 mg via ORAL
  Filled 2019-03-30: qty 2

## 2019-03-30 MED ORDER — ONDANSETRON HCL 4 MG PO TABS: 4.0000 mg | ORAL_TABLET | Freq: Four times a day (QID) | ORAL | Status: DC | PRN

## 2019-03-30 MED ORDER — ONDANSETRON HCL 4 MG/2ML IJ SOLN: 4.0000 mg | Freq: Four times a day (QID) | INTRAMUSCULAR | Status: DC | PRN

## 2019-03-30 MED ORDER — METOPROLOL TARTRATE 25 MG PO TABS
12.5000 mg | ORAL_TABLET | Freq: Two times a day (BID) | ORAL | Status: DC
  Administered 2019-03-31 – 2019-04-01 (×4): 12.5 mg via ORAL
  Filled 2019-03-30 (×4): qty 1

## 2019-03-30 MED ORDER — ATORVASTATIN CALCIUM 10 MG PO TABS
10.0000 mg | ORAL_TABLET | Freq: Every day | ORAL | Status: DC
  Administered 2019-03-31 – 2019-04-09 (×10): 10 mg via ORAL
  Filled 2019-03-30 (×12): qty 1

## 2019-03-30 MED ORDER — GUAIFENESIN-DM 100-10 MG/5ML PO SYRP
10.0000 mL | ORAL_SOLUTION | ORAL | Status: DC | PRN
  Administered 2019-04-02: 10 mL via ORAL
  Filled 2019-03-30: qty 10

## 2019-03-30 MED ORDER — SODIUM CHLORIDE 0.9 % IV BOLUS
1000.0000 mL | Freq: Once | INTRAVENOUS | Status: AC
  Administered 2019-03-30: 1000 mL via INTRAVENOUS

## 2019-03-30 MED ORDER — ACETAMINOPHEN 325 MG PO TABS
650.0000 mg | ORAL_TABLET | Freq: Four times a day (QID) | ORAL | Status: DC | PRN
  Administered 2019-03-31 – 2019-04-01 (×2): 650 mg via ORAL
  Filled 2019-03-30 (×3): qty 2

## 2019-03-30 MED ORDER — HYDROCOD POLST-CPM POLST ER 10-8 MG/5ML PO SUER: 5.0000 mL | Freq: Two times a day (BID) | ORAL | Status: DC | PRN

## 2019-03-30 NOTE — ED Triage Notes (Signed)
Per EMS, Pt had cough and runny nose two days ago, symptoms resolved. Yesterday pt began having diarrhea. Denies N/V. Pt has not been rehydratating since then, and now feeling weak. Pt orthostatic pos.

## 2019-03-30 NOTE — ED Provider Notes (Signed)
[facility]-EMERGENCY DEPT Provider Note   CSN: [id number] Arrival date & time: 03/30/19  2039     History Chief Complaint  Patient presents with  . Fatigue  . Diarrhea    Jonathon Stevens is a 74 y.o. male.  The history is provided by the patient and medical records. No language interpreter was used.  Diarrhea  Jonathon Stevens is a 74 y.o. male who presents to the Emergency Department complaining of weakness. He presents the emergency department by EMS complaining of generalized weakness, fatigue and malaise that began about three days ago. He initially started out with mild diarrhea, 2 to 3 episodes a day. Fever began today. He denies any hematochezia or melena. He denies any nausea, vomiting, cough, shortness of breath, abdominal pain. He is having difficulty moving or getting out of bed secondary to generalized weakness. No known COVID 19 exposures. He lives at home with his wife. Level V caveat due to confusion    Past Medical History:  Diagnosis Date  . Atrial flutter (HCC)   . 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 Diabetes mellitus without complication (HCC)   . Gout   . HLD (hyperlipidemia)   . Hx of echocardiogram    a. ECHO 10/08/13: EF 45-50%, mild hypokinesis of apical myocardium. Mild LA dilation, mild RA dilation;  b.  TEE (10/09/13):  EF 50-55%, normal wall motion, no LAA clot  . Stroke [facility])     Patient Active Problem List   Diagnosis Date Noted  . Anticoagulated 07/13/2016  . Paroxysmal atrial fibrillation (HCC) 11/05/2014  . Abnormal electrocardiogram 02/02/2014  . Stroke (HCC) 11/19/2013  . HLD (hyperlipidemia) 10/27/2013  . Prediabetes 10/10/2013  . Atrial flutter (HCC)   . Cerebral embolism with cerebral infarction (HCC) 10/07/2013    Past Surgical History:  Procedure Laterality Date  . TEE WITHOUT CARDIOVERSION N/A 10/09/2013   Procedure: TRANSESOPHAGEAL ECHOCARDIOGRAM (TEE);  Surgeon: Chrystie Nose, MD;   Location: [facility] ENDOSCOPY;  Service: Cardiovascular;  Laterality: N/A;       Family History  Problem Relation Age of Onset  . Hypertension Mother   . Stroke Mother   . Hypertension Father   . Heart attack Neg Hx     Social History   Tobacco Use  . Smoking status: Former Smoker    Packs/day: 0.50    Years: 15.00    Pack years: 7.50    Types: Cigarettes    Quit date: 07/13/2015    Years since quitting: 3.7  . Smokeless tobacco: Never Used  Substance Use Topics  . Alcohol use: No    Alcohol/week: 0.0 standard drinks  . Drug use: No    Home Medications Prior to Admission medications   Medication Sig Start Date End Date Taking? Authorizing Provider  allopurinol (ZYLOPRIM) 300 MG tablet Take 1 tablet by mouth daily. 04/26/16   [provider]  atorvastatin (LIPITOR) 10 MG tablet TAKE 1 TABLET BY MOUTH DAILY AT [facility] 07/24/18   Corky Crafts, MD  COENZYME Q10 PO Take 1 tablet by mouth daily.     [provider]  GLUCOSAMINE-CHONDROITIN PO Take 1 tablet by mouth daily.     [provider]  metoprolol tartrate (LOPRESSOR) 25 MG tablet TAKE 1/2 TABLET(12.5 MG) BY MOUTH TWICE DAILY 07/24/18   Corky Crafts, MD  Multiple Vitamin (MULTIVITAMIN) tablet Take 1 tablet by mouth daily.    [provider]  Omega-3 Fatty Acids (FISH OIL PO) Take 2  tablets by mouth daily.    [provider]  rivaroxaban (XARELTO) 20 MG TABS tablet Take 1 tablet (20 mg total) by mouth daily. 07/24/18   Jettie Booze, MD    Allergies    Indomethacin  Review of Systems   Review of Systems  Gastrointestinal: Positive for diarrhea.  All other systems reviewed and are negative.   Physical Exam Updated Vital Signs BP (!) 152/119   Pulse (!) 156   Temp (!) 102.3 F (39.1 C) (Oral)   Resp (!) 25   Ht 5\' 11"  (1.803 m)   Wt 83.9 kg   SpO2 93%   BMI 25.80 kg/m   Physical Exam Vitals and nursing note reviewed.  Constitutional:      General: He  is in acute distress.     Appearance: He is well-developed. He is ill-appearing.  HENT:     Head: Normocephalic and atraumatic.  Cardiovascular:     Rate and Rhythm: Tachycardia present. Rhythm irregular.     Heart sounds: No murmur.  Pulmonary:     Effort: Pulmonary effort is normal. No respiratory distress.     Breath sounds: Normal breath sounds.  Abdominal:     Palpations: Abdomen is soft.     Tenderness: There is no abdominal tenderness. There is no guarding or rebound.  Musculoskeletal:        General: No tenderness.  Skin:    General: Skin is warm and dry.  Neurological:     Mental Status: He is alert.     Comments: Generalized weakness. Mildly confused.  Psychiatric:        Behavior: Behavior normal.     ED Results / Procedures / Treatments   Labs (all labs ordered are listed, but only abnormal results are displayed) Labs Reviewed  COMPREHENSIVE METABOLIC PANEL - Abnormal; Notable for the following components:      Result Value   Sodium 131 (*)    Chloride 97 (*)    Glucose, Bld 169 (*)    Creatinine, Ser 1.35 (*)    Calcium 8.7 (*)    GFR calc non Af Amer 52 (*)    GFR calc Af Amer 60 (*)    All other components within normal limits  CBC WITH DIFFERENTIAL/PLATELET - Abnormal; Notable for the following components:   Platelets 133 (*)    Lymphs Abs 0.5 (*)    All other components within normal limits  APTT - Abnormal; Notable for the following components:   aPTT 37 (*)    All other components within normal limits  PROTIME-INR - Abnormal; Notable for the following components:   Prothrombin Time 16.1 (*)    INR 1.3 (*)    All other components within normal limits  LACTATE DEHYDROGENASE - Abnormal; Notable for the following components:   LDH 237 (*)    All other components within normal limits  FERRITIN - Abnormal; Notable for the following components:   Ferritin 891 (*)    All other components within normal limits  FIBRINOGEN - Abnormal; Notable for the  following components:   Fibrinogen 592 (*)    All other components within normal limits  C-REACTIVE PROTEIN - Abnormal; Notable for the following components:   CRP 2.0 (*)    All other components within normal limits  BRAIN NATRIURETIC PEPTIDE - Abnormal; Notable for the following components:   B Natriuretic Peptide 143.2 (*)    All other components within normal limits  I-STAT CHEM 8, ED - Abnormal; Notable  for the following components:   Sodium 132 (*)    Chloride 96 (*)    Creatinine, Ser 1.40 (*)    Glucose, Bld 165 (*)    Calcium, Ion 1.10 (*)    All other components within normal limits  POC SARS CORONAVIRUS 2 AG -  ED - Abnormal; Notable for the following components:   SARS Coronavirus 2 Ag POSITIVE (*)    All other components within normal limits  TROPONIN I (HIGH SENSITIVITY) - Abnormal; Notable for the following components:   Troponin I (High Sensitivity) 42 (*)    All other components within normal limits  CULTURE, BLOOD (ROUTINE X 2)  CULTURE, BLOOD (ROUTINE X 2)  URINE CULTURE  LACTIC ACID, PLASMA  PROCALCITONIN  TRIGLYCERIDES  LACTIC ACID, PLASMA  URINALYSIS, ROUTINE W REFLEX MICROSCOPIC  CBG MONITORING, ED    EKG EKG Interpretation  Date/Time:  Sunday March 30 2019 21:05:20 EST Ventricular Rate:  152 PR Interval:    QRS Duration: 92 QT Interval:  309 QTC Calculation: 492 R Axis:   55 Text Interpretation: Supraventricular tachycardia vs atrial fibrillation with RVR Ventricular premature complex RSR' in V1 or V2, probably normal variant ST depression, probably rate related Confirmed by Tilden Fossa [phone]) on 03/30/2019 10:00:18 PM   Radiology DG Chest Port 1 View  Result Date: 03/30/2019 CLINICAL DATA:  Initial evaluation for acute fever, weakness. EXAM: PORTABLE CHEST 1 VIEW COMPARISON:  Prior radiograph from 08/27/2017. FINDINGS: Transverse heart size within normal limits. Mediastinal silhouette normal. Aortic atherosclerosis. Lungs normally  inflated. Diffuse interstitial prominence seen throughout the lungs bilaterally, which could reflect mild interstitial congestion or possibly atypical/viral pneumonitis. No consolidative opacity to suggest bronchopneumonia. No pleural effusion. No pneumothorax. No acute osseous finding. IMPRESSION: 1. Diffuse interstitial prominence throughout the lungs bilaterally, which could reflect mild interstitial congestion or possibly atypical/viral pneumonitis. No consolidative opacity to suggest bronchopneumonia. 2.  Aortic Atherosclerosis (ICD10-I70.0). Electronically Signed   By: Rise Mu M.D.   On: 03/30/2019 21:39    Procedures Procedures (including critical care time) CRITICAL CARE Performed by: Tilden Fossa   Total critical care time: 40 minutes  Critical care time was exclusive of separately billable procedures and treating other patients.  Critical care was necessary to treat or prevent imminent or life-threatening deterioration.  Critical care was time spent personally by me on the following activities: development of treatment plan with patient and/or surrogate as well as nursing, discussions with consultants, evaluation of patient's response to treatment, examination of patient, obtaining history from patient or surrogate, ordering and performing treatments and interventions, ordering and review of laboratory studies, ordering and review of radiographic studies, pulse oximetry and re-evaluation of patient's condition.  Medications Ordered in ED Medications  diltiazem (CARDIZEM) 125 mg in dextrose 5% 125 mL (1 mg/mL) infusion (7.5 mg/hr Intravenous Rate/Dose Change 03/30/19 2239)  sodium chloride 0.9 % bolus 1,000 mL (0 mLs Intravenous Stopped 03/30/19 2222)  acetaminophen (TYLENOL) tablet 650 mg (650 mg Oral Given 03/30/19 2130)    ED Course  I have reviewed the triage vital signs and the nursing notes.  Pertinent labs & imaging results that were available during my care of the  patient were reviewed by me and considered in my medical decision making (see chart for details).    MDM Rules/Calculators/A&P                     Patient here for evaluation of generalized weakness and malaise. He is ill appearing on evaluation  and in a fib with RVR. He was treated with acetaminophen for his fever, IV fluid hydration and Cardizem for weight control. COVID 19 swab came back positive. Chest x-ray does demonstrate infiltrates palpation at this time has no oxygen requirements. There is no evidence of bacterial infection at this time.   Discussed with patient findings of studies and recommendation for admission and he is in agreement with treatment plan.  Jonathon Stevens was evaluated in Emergency Department on 03/30/2019 for the symptoms described in the history of present illness. He was evaluated in the context of the global COVID-19 pandemic, which necessitated consideration that the patient might be at risk for infection with the SARS-CoV-2 virus that causes COVID-19. Institutional protocols and algorithms that pertain to the evaluation of patients at risk for COVID-19 are in a state of rapid change based on information released by regulatory bodies including the CDC and federal and state organizations. These policies and algorithms were followed during the patient's care in the ED.  Final Clinical Impression(s) / ED Diagnoses Final diagnoses:  Atrial fibrillation with RVR (HCC)  COVID-19 virus infection    Rx / DC Orders ED Discharge Orders    None       Tilden Fossa, MD 03/30/19 2334

## 2019-03-31 DIAGNOSIS — J1282 Pneumonia due to coronavirus disease 2019: Secondary | ICD-10-CM | POA: Diagnosis present

## 2019-03-31 DIAGNOSIS — Z79899 Other long term (current) drug therapy: Secondary | ICD-10-CM | POA: Diagnosis not present

## 2019-03-31 DIAGNOSIS — Z823 Family history of stroke: Secondary | ICD-10-CM | POA: Diagnosis not present

## 2019-03-31 DIAGNOSIS — R7303 Prediabetes: Secondary | ICD-10-CM | POA: Diagnosis not present

## 2019-03-31 DIAGNOSIS — I482 Chronic atrial fibrillation, unspecified: Secondary | ICD-10-CM | POA: Diagnosis present

## 2019-03-31 DIAGNOSIS — U071 COVID-19: Secondary | ICD-10-CM | POA: Diagnosis not present

## 2019-03-31 DIAGNOSIS — N390 Urinary tract infection, site not specified: Secondary | ICD-10-CM | POA: Diagnosis present

## 2019-03-31 DIAGNOSIS — G9341 Metabolic encephalopathy: Secondary | ICD-10-CM | POA: Diagnosis present

## 2019-03-31 DIAGNOSIS — F05 Delirium due to known physiological condition: Secondary | ICD-10-CM | POA: Diagnosis present

## 2019-03-31 DIAGNOSIS — Z7901 Long term (current) use of anticoagulants: Secondary | ICD-10-CM

## 2019-03-31 DIAGNOSIS — B958 Unspecified staphylococcus as the cause of diseases classified elsewhere: Secondary | ICD-10-CM | POA: Diagnosis present

## 2019-03-31 DIAGNOSIS — J9601 Acute respiratory failure with hypoxia: Secondary | ICD-10-CM | POA: Diagnosis not present

## 2019-03-31 DIAGNOSIS — R197 Diarrhea, unspecified: Secondary | ICD-10-CM | POA: Diagnosis present

## 2019-03-31 DIAGNOSIS — M109 Gout, unspecified: Secondary | ICD-10-CM | POA: Diagnosis present

## 2019-03-31 DIAGNOSIS — E871 Hypo-osmolality and hyponatremia: Secondary | ICD-10-CM | POA: Diagnosis not present

## 2019-03-31 DIAGNOSIS — I480 Paroxysmal atrial fibrillation: Principal | ICD-10-CM

## 2019-03-31 DIAGNOSIS — I4891 Unspecified atrial fibrillation: Secondary | ICD-10-CM | POA: Diagnosis not present

## 2019-03-31 DIAGNOSIS — Z8673 Personal history of transient ischemic attack (TIA), and cerebral infarction without residual deficits: Secondary | ICD-10-CM | POA: Diagnosis not present

## 2019-03-31 DIAGNOSIS — Z888 Allergy status to other drugs, medicaments and biological substances status: Secondary | ICD-10-CM | POA: Diagnosis not present

## 2019-03-31 DIAGNOSIS — I4892 Unspecified atrial flutter: Secondary | ICD-10-CM | POA: Diagnosis present

## 2019-03-31 DIAGNOSIS — N179 Acute kidney failure, unspecified: Secondary | ICD-10-CM | POA: Diagnosis present

## 2019-03-31 DIAGNOSIS — Z87891 Personal history of nicotine dependence: Secondary | ICD-10-CM | POA: Diagnosis not present

## 2019-03-31 DIAGNOSIS — E785 Hyperlipidemia, unspecified: Secondary | ICD-10-CM | POA: Diagnosis present

## 2019-03-31 DIAGNOSIS — Z8249 Family history of ischemic heart disease and other diseases of the circulatory system: Secondary | ICD-10-CM | POA: Diagnosis not present

## 2019-03-31 DIAGNOSIS — Z9981 Dependence on supplemental oxygen: Secondary | ICD-10-CM | POA: Diagnosis not present

## 2019-03-31 DIAGNOSIS — R739 Hyperglycemia, unspecified: Secondary | ICD-10-CM | POA: Diagnosis not present

## 2019-03-31 LAB — CBC WITH DIFFERENTIAL/PLATELET
Abs Immature Granulocytes: 0.02 10*3/uL (ref 0.00–0.07)
Basophils Absolute: 0 10*3/uL (ref 0.0–0.1)
Basophils Relative: 1 %
Eosinophils Absolute: 0 10*3/uL (ref 0.0–0.5)
Eosinophils Relative: 0 %
HCT: 46.3 % (ref 39.0–52.0)
Hemoglobin: 15.5 g/dL (ref 13.0–17.0)
Immature Granulocytes: 0 %
Lymphocytes Relative: 10 %
Lymphs Abs: 0.6 10*3/uL — ABNORMAL LOW (ref 0.7–4.0)
MCH: 30.7 pg (ref 26.0–34.0)
MCHC: 33.5 g/dL (ref 30.0–36.0)
MCV: 91.7 fL (ref 80.0–100.0)
Monocytes Absolute: 0.5 10*3/uL (ref 0.1–1.0)
Monocytes Relative: 8 %
Neutro Abs: 4.9 10*3/uL (ref 1.7–7.7)
Neutrophils Relative %: 81 %
Platelets: 122 10*3/uL — ABNORMAL LOW (ref 150–400)
RBC: 5.05 MIL/uL (ref 4.22–5.81)
RDW: 13.2 % (ref 11.5–15.5)
WBC: 6 10*3/uL (ref 4.0–10.5)
nRBC: 0 % (ref 0.0–0.2)

## 2019-03-31 LAB — URINALYSIS, ROUTINE W REFLEX MICROSCOPIC
Bilirubin Urine: NEGATIVE
Glucose, UA: NEGATIVE mg/dL
Hgb urine dipstick: NEGATIVE
Ketones, ur: 5 mg/dL — AB
Leukocytes,Ua: NEGATIVE
Nitrite: NEGATIVE
Protein, ur: 100 mg/dL — AB
Specific Gravity, Urine: 1.018 (ref 1.005–1.030)
pH: 5 (ref 5.0–8.0)

## 2019-03-31 LAB — COMPREHENSIVE METABOLIC PANEL
ALT: 29 U/L (ref 0–44)
AST: 36 U/L (ref 15–41)
Albumin: 4.1 g/dL (ref 3.5–5.0)
Alkaline Phosphatase: 69 U/L (ref 38–126)
Anion gap: 11 (ref 5–15)
BUN: 21 mg/dL (ref 8–23)
CO2: 22 mmol/L (ref 22–32)
Calcium: 8.4 mg/dL — ABNORMAL LOW (ref 8.9–10.3)
Chloride: 97 mmol/L — ABNORMAL LOW (ref 98–111)
Creatinine, Ser: 1.44 mg/dL — ABNORMAL HIGH (ref 0.61–1.24)
GFR calc Af Amer: 55 mL/min — ABNORMAL LOW (ref >60)
GFR calc non Af Amer: 48 mL/min — ABNORMAL LOW (ref >60)
Glucose, Bld: 181 mg/dL — ABNORMAL HIGH (ref 70–99)
Potassium: 3.9 mmol/L (ref 3.5–5.1)
Sodium: 130 mmol/L — ABNORMAL LOW (ref 135–145)
Total Bilirubin: 1 mg/dL (ref 0.3–1.2)
Total Protein: 7.3 g/dL (ref 6.5–8.1)

## 2019-03-31 LAB — CBG MONITORING, ED
Glucose-Capillary: 159 mg/dL — ABNORMAL HIGH (ref 70–99)
Glucose-Capillary: 149 mg/dL — ABNORMAL HIGH (ref 70–99)
Glucose-Capillary: 149 mg/dL — ABNORMAL HIGH (ref 70–99)
Glucose-Capillary: 134 mg/dL — ABNORMAL HIGH (ref 70–99)
Glucose-Capillary: 213 mg/dL — ABNORMAL HIGH (ref 70–99)

## 2019-03-31 LAB — PROCALCITONIN: Procalcitonin: 0.16 ng/mL

## 2019-03-31 LAB — TROPONIN I (HIGH SENSITIVITY): Troponin I (High Sensitivity): 53 ng/L — ABNORMAL HIGH (ref <18)

## 2019-03-31 LAB — D-DIMER, QUANTITATIVE: D-Dimer, Quant: 2.16 ug/mL-FEU — ABNORMAL HIGH (ref 0.00–0.50)

## 2019-03-31 LAB — LACTIC ACID, PLASMA: Lactic Acid, Venous: 1.2 mmol/L (ref 0.5–1.9)

## 2019-03-31 LAB — ABO/RH: ABO/RH(D): A POS

## 2019-03-31 LAB — C-REACTIVE PROTEIN: CRP: 3.4 mg/dL — ABNORMAL HIGH (ref <1.0)

## 2019-03-31 MED ORDER — LOPERAMIDE HCL 2 MG PO CAPS
2.0000 mg | ORAL_CAPSULE | ORAL | Status: DC | PRN
  Administered 2019-03-31: 2 mg via ORAL
  Filled 2019-03-31: qty 1

## 2019-03-31 NOTE — H&P (Signed)
History and Physical    Star Cheese MOL:[id number] DOB: 01-24-1946 DOA: 03/30/2019  PCP: Delorse Lek, MD  Patient coming from: Home  I have personally briefly reviewed patient's old medical records in [facility]  Chief Complaint: Fatigue, diarrhea  HPI: Jonathon Stevens is a 74 y.o. male with medical history significant of PAF on xarelto, prior stroke, prediabetes.  Patient presents to ED with c/o generalized weakness, malaise, mild diarrhea.  Fever onset today.  No N/V, no SOB nor cough.  No known COVID exposures.   ED Course: Pt with Tm 102.3, A.Fib RVR with rate up to 150, no O2 requirement satting low 90s on RA.  Put on cardizem gtt for rate control.  COVID test is positive.  WBC nl, procalcitonin neg, CRP 2.0, CXR shows either mild interstitial congestion or atypical / viral pneumonia.   Review of Systems: As per HPI, otherwise all review of systems negative.  Past Medical History:  Diagnosis Date  . Atrial flutter (HCC)   . 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 Diabetes mellitus without complication (HCC)   . Gout   . HLD (hyperlipidemia)   . Hx of echocardiogram    a. ECHO 10/08/13: EF 45-50%, mild hypokinesis of apical myocardium. Mild LA dilation, mild RA dilation;  b.  TEE (10/09/13):  EF 50-55%, normal wall motion, no LAA clot  . Stroke [facility])     Past Surgical History:  Procedure Laterality Date  . TEE WITHOUT CARDIOVERSION N/A 10/09/2013   Procedure: TRANSESOPHAGEAL ECHOCARDIOGRAM (TEE);  Surgeon: Chrystie Nose, MD;  Location: [facility] ENDOSCOPY;  Service: Cardiovascular;  Laterality: N/A;     reports that he quit smoking about 3 years ago. His smoking use included cigarettes. He has a 7.50 pack-year smoking history. He has never used smokeless tobacco. He reports that he does not drink alcohol or use drugs.  Allergies  Allergen Reactions  . Indomethacin Rash    Family History  Problem Relation Age of Onset  . Hypertension Mother    . Stroke Mother   . Hypertension Father   . Heart attack Neg Hx      Prior to Admission medications   Medication Sig Start Date End Date Taking? Authorizing Provider  allopurinol (ZYLOPRIM) 300 MG tablet Take 300 mg by mouth daily after breakfast.  04/26/16  Yes [provider]  atorvastatin (LIPITOR) 10 MG tablet TAKE 1 TABLET BY MOUTH DAILY AT 6PM Patient taking differently: Take 10 mg by mouth every evening.  07/24/18  Yes Corky Crafts, MD  COENZYME Q10 PO Take 1 tablet by mouth daily.    Yes [provider]  GLUCOSAMINE-CHONDROITIN PO Take 1 tablet by mouth daily.    Yes [provider]  metoprolol tartrate (LOPRESSOR) 25 MG tablet TAKE 1/2 TABLET(12.5 MG) BY MOUTH TWICE DAILY Patient taking differently: Take 12.5 mg by mouth 2 (two) times daily.  07/24/18  Yes Corky Crafts, MD  Multiple Vitamin (MULTIVITAMIN) tablet Take 1 tablet by mouth daily.   Yes [provider]  Omega-3 Fatty Acids (FISH OIL PO) Take 2 tablets by mouth daily.   Yes [provider]  rivaroxaban (XARELTO) 20 MG TABS tablet Take 1 tablet (20 mg total) by mouth daily. 07/24/18  Yes Corky Crafts, MD    Physical Exam: Vitals:   03/30/19 2054 03/30/19 2101 03/30/19 2330  BP:  (!) 152/119 132/82  Pulse:  (!) 156 99  Resp:  (!) 25 15  Temp:  Marland Kitchen)  102.3 F (39.1 C)   TempSrc:  Oral   SpO2: 98% 93% (!) 87%  Weight:  83.9 kg   Height:  5\' 11"  (1.803 m)     Constitutional: NAD, calm, comfortable Eyes: PERRL, lids and conjunctivae normal ENMT: Mucous membranes are moist. Posterior pharynx clear of any exudate or lesions.Normal dentition.  Neck: normal, supple, no masses, no thyromegaly Respiratory: clear to auscultation bilaterally, no wheezing, no crackles. Normal respiratory effort. No accessory muscle use.  Cardiovascular: IRR, IRR, no murmurs / rubs / gallops. No extremity edema. 2+ pedal pulses. No carotid bruits.  Abdomen: no tenderness, no  masses palpated. No hepatosplenomegaly. Bowel sounds positive.  Musculoskeletal: no clubbing / cyanosis. No joint deformity upper and lower extremities. Good ROM, no contractures. Normal muscle tone.  Skin: no rashes, lesions, ulcers. No induration Neurologic: CN 2-12 grossly intact. Sensation intact, DTR normal. Strength 5/5 in all 4.  Psychiatric: Normal judgment and insight. Alert and oriented x 3. Normal mood.    Labs on Admission: I have personally reviewed following labs and imaging studies  CBC: Recent Labs  Lab 03/30/19 2146 03/30/19 2214  WBC 5.5  --   NEUTROABS 4.5  --   HGB 15.5 16.0  HCT 46.0 47.0  MCV 91.8  --   PLT 133*  --    Basic Metabolic Panel: Recent Labs  Lab 03/30/19 2146 03/30/19 2214  NA 131* 132*  K 3.9 3.7  CL 97* 96*  CO2 22  --   GLUCOSE 169* 165*  BUN 18 18  CREATININE 1.35* 1.40*  CALCIUM 8.7*  --    GFR: Estimated Creatinine Clearance: 50.1 mL/min (A) (by C-G formula based on SCr of 1.4 mg/dL (H)). Liver Function Tests: Recent Labs  Lab 03/30/19 2146  AST 35  ALT 30  ALKPHOS 76  BILITOT 0.8  PROT 7.6  ALBUMIN 4.1   No results for input(s): LIPASE, AMYLASE in the last 168 hours. No results for input(s): AMMONIA in the last 168 hours. Coagulation Profile: Recent Labs  Lab 03/30/19 2146  INR 1.3*   Cardiac Enzymes: No results for input(s): CKTOTAL, CKMB, CKMBINDEX, TROPONINI in the last 168 hours. BNP (last 3 results) No results for input(s): PROBNP in the last 8760 hours. HbA1C: No results for input(s): HGBA1C in the last 72 hours. CBG: No results for input(s): GLUCAP in the last 168 hours. Lipid Profile: Recent Labs    03/30/19 2146  TRIG 46   Thyroid Function Tests: No results for input(s): TSH, T4TOTAL, FREET4, T3FREE, THYROIDAB in the last 72 hours. Anemia Panel: Recent Labs    03/30/19 2146  FERRITIN 891*   Urine analysis:    Component Value Date/Time   COLORURINE YELLOW 10/07/2013 1400   APPEARANCEUR  CLEAR 10/07/2013 1400   LABSPEC 1.013 10/07/2013 1400   PHURINE 5.5 10/07/2013 1400   GLUCOSEU NEGATIVE 10/07/2013 1400   HGBUR NEGATIVE 10/07/2013 1400   BILIRUBINUR NEGATIVE 10/07/2013 1400   KETONESUR NEGATIVE 10/07/2013 1400   PROTEINUR NEGATIVE 10/07/2013 1400   UROBILINOGEN 0.2 10/07/2013 1400   NITRITE NEGATIVE 10/07/2013 1400   LEUKOCYTESUR NEGATIVE 10/07/2013 1400    Radiological Exams on Admission: DG Chest Port 1 View  Result Date: 03/30/2019 CLINICAL DATA:  Initial evaluation for acute fever, weakness. EXAM: PORTABLE CHEST 1 VIEW COMPARISON:  Prior radiograph from 08/27/2017. FINDINGS: Transverse heart size within normal limits. Mediastinal silhouette normal. Aortic atherosclerosis. Lungs normally inflated. Diffuse interstitial prominence seen throughout the lungs bilaterally, which could reflect mild interstitial congestion or possibly  atypical/viral pneumonitis. No consolidative opacity to suggest bronchopneumonia. No pleural effusion. No pneumothorax. No acute osseous finding. IMPRESSION: 1. Diffuse interstitial prominence throughout the lungs bilaterally, which could reflect mild interstitial congestion or possibly atypical/viral pneumonitis. No consolidative opacity to suggest bronchopneumonia. 2.  Aortic Atherosclerosis (ICD10-I70.0). Electronically Signed   By: Rise Mu M.D.   On: 03/30/2019 21:39    EKG: Independently reviewed.  Assessment/Plan Principal Problem:   Atrial fibrillation with RVR (HCC) Active Problems:   Prediabetes   Paroxysmal atrial fibrillation (HCC)   Anticoagulated   COVID-19 virus infection    1. A.Fib RVR - 1. Rate control with cardizem 2. Continue home metoprolol BID 3. Continue xarelto 4. Tele monitor 2. COVID-19 1. COVID pathway 2. remdesivir 3. No O2 requirement at this point, start steroids if he develops O2 requirement 4. Cont pulse ox 5. Daily labs 3. Prediabetes - 1. Mod scale SSI AC/HS  DVT prophylaxis:  Xarelto Code Status: Full Family Communication: No family in room Disposition Plan: Home after admit Consults called: None Admission status: Place in 62    Taquana Bartley M. DO [facility]  How to contact the [facility] Attending or Consulting provider 7A - 7P or covering provider during after hours 7P -7A, for this patient?  1. Check the care team in [facility] and look for a) attending/consulting TRH provider listed and b) the [facility] team listed 2. Log into [email or web address]  Amion Physician Scheduling and messaging for groups and whole hospitals  On call and physician scheduling software for group practices, residents, hospitalists and other medical providers for call, clinic, rotation and shift schedules. OnCall Enterprise is a hospital-wide system for scheduling doctors and paging doctors on call. EasyPlot is for scientific plotting and data analysis.  [email or web address]  and use [place]'s universal password to access. If you do not have the password, please contact the hospital operator.  3. Locate the [facility] Main provider you are looking for under [facility] and page to a number that you can be directly reached. 4. If you still have difficulty reaching the provider, please page the [facility] (Director on Call) for the Hospitalists listed on amion for assistance.  03/31/2019, 12:07 AM

## 2019-03-31 NOTE — Progress Notes (Signed)
Patient seen after rounds and doing well. Has been febrile today and treated with tylenol. Not requiring oxygen. Was on dilt drip this AM but taken off. Has been slightly bradycardic here and there. Has been put back on metop. Will likely send home on vitamins tomorrow if he remains stable. Exam is not concerning except for irregular rhythm, but normal reate in the 70s and 80s. No LE edema, not confused, breathing well on RA. Patient is back on his home meds and doing well. Hopefully will discharge tomorrow. Can use supportive care in the mean time.  Dr. Carlena Sax

## 2019-04-01 ENCOUNTER — Inpatient Hospital Stay (HOSPITAL_COMMUNITY): Payer: Medicare Other

## 2019-04-01 LAB — CBG MONITORING, ED
Glucose-Capillary: 136 mg/dL — ABNORMAL HIGH (ref 70–99)
Glucose-Capillary: 142 mg/dL — ABNORMAL HIGH (ref 70–99)
Glucose-Capillary: 147 mg/dL — ABNORMAL HIGH (ref 70–99)

## 2019-04-01 LAB — COMPREHENSIVE METABOLIC PANEL
ALT: 42 U/L (ref 0–44)
AST: 85 U/L — ABNORMAL HIGH (ref 15–41)
Albumin: 4 g/dL (ref 3.5–5.0)
Alkaline Phosphatase: 59 U/L (ref 38–126)
Anion gap: 13 (ref 5–15)
BUN: 22 mg/dL (ref 8–23)
CO2: 19 mmol/L — ABNORMAL LOW (ref 22–32)
Calcium: 8.3 mg/dL — ABNORMAL LOW (ref 8.9–10.3)
Chloride: 96 mmol/L — ABNORMAL LOW (ref 98–111)
Creatinine, Ser: 1.21 mg/dL (ref 0.61–1.24)
GFR calc Af Amer: >60 mL/min (ref >60)
GFR calc non Af Amer: 59 mL/min — ABNORMAL LOW (ref >60)
Glucose, Bld: 150 mg/dL — ABNORMAL HIGH (ref 70–99)
Potassium: 3.4 mmol/L — ABNORMAL LOW (ref 3.5–5.1)
Sodium: 128 mmol/L — ABNORMAL LOW (ref 135–145)
Total Bilirubin: 0.9 mg/dL (ref 0.3–1.2)
Total Protein: 6.8 g/dL (ref 6.5–8.1)

## 2019-04-01 LAB — CBC WITH DIFFERENTIAL/PLATELET
Abs Immature Granulocytes: 0.01 10*3/uL (ref 0.00–0.07)
Basophils Absolute: 0 10*3/uL (ref 0.0–0.1)
Basophils Relative: 1 %
Eosinophils Absolute: 0 10*3/uL (ref 0.0–0.5)
Eosinophils Relative: 0 %
HCT: 45.4 % (ref 39.0–52.0)
Hemoglobin: 15.5 g/dL (ref 13.0–17.0)
Immature Granulocytes: 0 %
Lymphocytes Relative: 20 %
Lymphs Abs: 0.9 10*3/uL (ref 0.7–4.0)
MCH: 30.6 pg (ref 26.0–34.0)
MCHC: 34.1 g/dL (ref 30.0–36.0)
MCV: 89.5 fL (ref 80.0–100.0)
Monocytes Absolute: 0.4 10*3/uL (ref 0.1–1.0)
Monocytes Relative: 8 %
Neutro Abs: 3.1 10*3/uL (ref 1.7–7.7)
Neutrophils Relative %: 71 %
Platelets: 95 10*3/uL — ABNORMAL LOW (ref 150–400)
RBC: 5.07 MIL/uL (ref 4.22–5.81)
RDW: 13.1 % (ref 11.5–15.5)
WBC: 4.4 10*3/uL (ref 4.0–10.5)
nRBC: 0 % (ref 0.0–0.2)

## 2019-04-01 LAB — D-DIMER, QUANTITATIVE: D-Dimer, Quant: 2.93 ug/mL-FEU — ABNORMAL HIGH (ref 0.00–0.50)

## 2019-04-01 LAB — GLUCOSE, CAPILLARY: Glucose-Capillary: 166 mg/dL — ABNORMAL HIGH (ref 70–99)

## 2019-04-01 LAB — MRSA PCR SCREENING: MRSA by PCR: NEGATIVE

## 2019-04-01 LAB — HEMOGLOBIN A1C
Hgb A1c MFr Bld: 6.6 % — ABNORMAL HIGH (ref 4.8–5.6)
Mean Plasma Glucose: 143 mg/dL

## 2019-04-01 LAB — C-REACTIVE PROTEIN: CRP: 5.9 mg/dL — ABNORMAL HIGH (ref <1.0)

## 2019-04-01 MED ORDER — METOPROLOL TARTRATE 25 MG PO TABS
25.0000 mg | ORAL_TABLET | Freq: Two times a day (BID) | ORAL | Status: DC
  Administered 2019-04-01 – 2019-04-03 (×4): 25 mg via ORAL
  Filled 2019-04-01 (×4): qty 1

## 2019-04-01 MED ORDER — ASCORBIC ACID 500 MG PO TABS
500.0000 mg | ORAL_TABLET | Freq: Every day | ORAL | Status: DC
  Administered 2019-04-01 – 2019-04-10 (×10): 500 mg via ORAL
  Filled 2019-04-01 (×11): qty 1

## 2019-04-01 MED ORDER — ZINC SULFATE 220 (50 ZN) MG PO CAPS
220.0000 mg | ORAL_CAPSULE | Freq: Every day | ORAL | Status: DC
  Administered 2019-04-01 – 2019-04-10 (×10): 220 mg via ORAL
  Filled 2019-04-01 (×11): qty 1

## 2019-04-01 MED ORDER — ORAL CARE MOUTH RINSE
15.0000 mL | Freq: Two times a day (BID) | OROMUCOSAL | Status: DC
  Administered 2019-04-02 – 2019-04-10 (×16): 15 mL via OROMUCOSAL

## 2019-04-01 MED ORDER — CHLORHEXIDINE GLUCONATE CLOTH 2 % EX PADS
6.0000 | MEDICATED_PAD | Freq: Every day | CUTANEOUS | Status: DC
  Administered 2019-04-01 – 2019-04-09 (×3): 6 via TOPICAL

## 2019-04-01 MED ORDER — ADULT MULTIVITAMIN W/MINERALS CH
1.0000 | ORAL_TABLET | Freq: Every day | ORAL | Status: DC
  Administered 2019-04-01 – 2019-04-10 (×10): 1 via ORAL
  Filled 2019-04-01 (×11): qty 1

## 2019-04-01 NOTE — Progress Notes (Signed)
Patient called out to say he was tangled up. This nurse found patient on the floor. Patient was alert and able to answer orient questions. Pupil are equal and reactive to light. Donnamarie Poag, NP was notified. Patient was put in and educated about restraints.

## 2019-04-01 NOTE — ED Notes (Signed)
This nurse heard a call that the pt in room 24 was found on the floor. I went in, and the pt had a red bruise on the back of his head. Pt was alert and able to answer all questions. His pupils were reactive to light. Pt was instructed to use the call bell to get out of bed and use the bathroom in the future. The MD was notified and a CT was ordered.

## 2019-04-01 NOTE — TOC Initial Note (Signed)
Transition of Care [facility]) - Initial/Assessment Note    Patient Details  Name: Jonathon Stevens MRN: [id number] Date of Birth: 1945/06/14  [facility]) CM/SW Contact:    Elliot Cousin, RN Phone Number:  [phone] 04/01/2019, 3:41 PM  Clinical Narrative:                  TOC CM spoke to pt and states he does want RW and 3n1 bedside commode for home. Will order DME from [employer] to be delivered to room. Declines HH PT at this time. Lives at home with wife. Drives to his appts.     Expected Discharge Plan: Home w Home Health Services Barriers to Discharge: Continued Medical Work up   Patient Goals and CMS Choice Patient states their goals for this hospitalization and ongoing recovery are:: feeling real weak CMS Medicare.gov Compare Post Acute Care list provided to:: Patient Choice offered to / list presented to : Patient  Expected Discharge Plan and Services Expected Discharge Plan: Home w Home Health Services In-house Referral: Clinical Social Work Discharge Planning Services: CM Consult Post Acute Care Choice: Home Health Living arrangements for the past 2 months: Single Family Home                 DME Arranged: 3-N-1, Walker rolling         HH Arranged: Patient Refused HH          Prior Living Arrangements/Services Living arrangements for the past 2 months: Single Family Home Lives with:: Spouse Patient language and need for interpreter reviewed:: Yes Do you feel safe going back to the place where you live?: Yes      Need for Family Participation in Patient Care: Yes (Comment) Care giver support system in place?: Yes (comment)   Criminal Activity/Legal Involvement Pertinent to Current Situation/Hospitalization: No - Comment as needed  Activities of Daily Living Home Assistive Devices/Equipment: Eyeglasses, Dentures (specify type)(upper/lower dentures) ADL Screening (condition at time of admission) Patient's cognitive ability adequate to  safely complete daily activities?: Yes Is the patient deaf or have difficulty hearing?: No Does the patient have difficulty seeing, even when wearing glasses/contacts?: No Does the patient have difficulty concentrating, remembering, or making decisions?: No Patient able to express need for assistance with ADLs?: Yes Does the patient have difficulty dressing or bathing?: No Independently performs ADLs?: Yes (appropriate for developmental age) Does the patient have difficulty walking or climbing stairs?: Yes(secondary too illness) Weakness of Legs: Both Weakness of Arms/Hands: None  Permission Sought/Granted Permission sought to share information with : Case Manager, PCP, Family Supports Permission granted to share information with : Yes, Verbal Permission Granted  Share Information with NAME: Nehal Shives  Permission granted to share info w AGENCY: Home Health, [facility]  Permission granted to share info w Relationship: wife  Permission granted to share info w Contact Information: [phone]  Emotional Assessment   Attitude/Demeanor/Rapport: Engaged Affect (typically observed): Accepting Orientation: : Oriented to Self, Oriented to  Time, Oriented to Place, Oriented to Situation   Psych Involvement: No (comment)  Admission diagnosis:  Atrial fibrillation with RVR (HCC) [I48.91] Patient Active Problem List   Diagnosis Date Noted  . Atrial fibrillation with RVR (HCC) 03/30/2019  . COVID-19 virus infection 03/30/2019  . Anticoagulated 07/13/2016  . Paroxysmal atrial fibrillation (HCC) 11/05/2014  . Abnormal electrocardiogram 02/02/2014  . Stroke (HCC) 11/19/2013  . HLD (hyperlipidemia) 10/27/2013  . Prediabetes 10/10/2013  . Atrial flutter (HCC)   . Cerebral  embolism with cerebral infarction ([place]) 10/07/2013   PCP:  Stephens Shire, MD Pharmacy:   [facility] [place], [address] AT [address] [place] [address] Phone: [phone] Fax: [phone]     Social Determinants of Health (SDOH) Interventions    Readmission Risk Interventions No flowsheet data found.

## 2019-04-01 NOTE — ED Notes (Addendum)
Pt stand and pivot assisted x1 to bedside toilet, pt stumbled into wall but maintained balance, pt stated he was alright, pt provided peri-care to self after usage and stand and pivoted to bed x1 w/o incident.

## 2019-04-01 NOTE — ED Notes (Signed)
Messaged pharmacy for missing medications.

## 2019-04-01 NOTE — ED Notes (Signed)
Advised by hospitalist to restart Cardizem drip

## 2019-04-01 NOTE — ED Notes (Signed)
Assisted pt with changing of hospital gown and bed linens. Pt is currently on phone with his wife and resting comfortably

## 2019-04-01 NOTE — ED Notes (Signed)
PT provided meal tray and assistance setting up

## 2019-04-01 NOTE — ED Notes (Signed)
PT assisted to bedside commode. 1 occurrence of liquid BM

## 2019-04-01 NOTE — Progress Notes (Signed)
PROGRESS NOTE    Jonathon Stevens  ZDG:[id number] DOB: [birth year] DOA: 03/30/2019 PCP: Delorse Lek, MD   Brief Narrative:  Jonathon Stevens is a 74 y.o. male with medical history significant of PAF on xarelto, prior stroke, prediabetes. Patient presented to ED with c/o generalized weakness, malaise, mild diarrhea.  No N/V, no SOB nor cough.  No known COVID exposures.  ED Course: Pt with Tm 102.3, A.Fib RVR with rate up to 150, no O2 requirement satting low 90s on RA. Put on cardizem gtt for rate control. COVID test is positive.  Interim history: Came off Dilt drip for greater than 12 hours on home dose of metoprolol but went back into Afib with RVR on night of 1/4. Placed back on dilt drip. Started remdesivir. On RA.   Assessment & Plan:   Principal Problem:   Atrial fibrillation with RVR (HCC) Active Problems:   Prediabetes   Paroxysmal atrial fibrillation (HCC)   Anticoagulated   COVID-19 virus infection  1. A.Fib RVR - 1. Rate control with cardizem, will try to wean off today 2. Continue home metoprolol BID, will increase to 25mg  BID given persistence of cardizem drip 3. Continue xarelto as AC 4. Tele monitor 2. COVID-19 1. COVID pathway 2. remdesivir day 2/5 3. No O2 requirement at this point, start steroids if he develops O2 requirement 4. MVI, Zinc, Vit D, Vit C 5. Cont pulse ox 6. Daily labs, including inflammatory markers 3. Prediabetes - 1. Mod scale SSI AC/HS  DVT prophylaxis: Xarelto Code Status: Full Family Communication: No family in room Disposition Plan: Home after admit Consults called: None Admission status: Place in obs   Subjective: No complaints today except doesn't want to eat. Forcing himself to eat breakfast. No chest pain or SOB. Put back on dilt drip overnight due to RVR. Otherwise no issues today.    Objective: Vitals:   04/01/19 0930 04/01/19 1000 04/01/19 1015 04/01/19 1045  BP: 125/74 123/76    Pulse: 83 94 90 (!) 103  Resp: 20  20 (!) 26 19  Temp:      TempSrc:      SpO2: 98% 94% 93% 97%  Weight:      Height:       No intake or output data in the 24 hours ending 04/01/19 1149 Filed Weights   03/30/19 2101  Weight: 83.9 kg    Examination:  General exam: Appears calm and comfortable, wearing hoodie as he has chills  Respiratory system: Clear to auscultation. Respiratory effort normal. Cardiovascular system: S1 & S2 heard, Irregularly irregulary, rate < 100. No JVD, murmurs, rubs, gallops or clicks. No pedal edema. Gastrointestinal system: Abdomen is nondistended, soft and nontender. No organomegaly or masses felt. Normal bowel sounds heard. Central nervous system: Alert and oriented. No focal neurological deficits. Extremities: Symmetric 5 x 5 power. Skin: No rashes, lesions or ulcers Psychiatry: Judgement and insight appear normal. Mood & affect appropriate.     Data Reviewed: I have personally reviewed following labs and imaging studies  CBC: Recent Labs  Lab 03/30/19 2146 03/30/19 2214 03/31/19 0500 04/01/19 0500  WBC 5.5  --  6.0 4.4  NEUTROABS 4.5  --  4.9 3.1  HGB 15.5 16.0 15.5 15.5  HCT 46.0 47.0 46.3 45.4  MCV 91.8  --  91.7 89.5  PLT 133*  --  122* 95*   Basic Metabolic Panel: Recent Labs  Lab 03/30/19 2146 03/30/19 2214 03/31/19 0500 04/01/19 0500  NA 131* 132* 130* 128*  K  3.9 3.7 3.9 3.4*  CL 97* 96* 97* 96*  CO2 22  --  22 19*  GLUCOSE 169* 165* 181* 150*  BUN 18 18 21 22   CREATININE 1.35* 1.40* 1.44* 1.21  CALCIUM 8.7*  --  8.4* 8.3*   GFR: Estimated Creatinine Clearance: 57.9 mL/min (by C-G formula based on SCr of 1.21 mg/dL). Liver Function Tests: Recent Labs  Lab 03/30/19 2146 03/31/19 0500 04/01/19 0500  AST 35 36 85*  ALT 30 29 42  ALKPHOS 76 69 59  BILITOT 0.8 1.0 0.9  PROT 7.6 7.3 6.8  ALBUMIN 4.1 4.1 4.0   No results for input(s): LIPASE, AMYLASE in the last 168 hours. No results for input(s): AMMONIA in the last 168 hours. Coagulation  Profile: Recent Labs  Lab 03/30/19 2146  INR 1.3*   Cardiac Enzymes: No results for input(s): CKTOTAL, CKMB, CKMBINDEX, TROPONINI in the last 168 hours. BNP (last 3 results) No results for input(s): PROBNP in the last 8760 hours. HbA1C: Recent Labs    03/31/19 0248  HGBA1C 6.6*   CBG: Recent Labs  Lab 03/31/19 0811 03/31/19 1212 03/31/19 1625 03/31/19 2328 04/01/19 0751  GLUCAP 149* 149* 134* 213* 136*   Lipid Profile: Recent Labs    03/30/19 2146  TRIG 46   Thyroid Function Tests: No results for input(s): TSH, T4TOTAL, FREET4, T3FREE, THYROIDAB in the last 72 hours. Anemia Panel: Recent Labs    03/30/19 2146  FERRITIN 891*   Sepsis Labs: Recent Labs  Lab 03/30/19 2146 03/31/19 0248  PROCALCITON <0.10 0.16  LATICACIDVEN 1.3 1.2    Recent Results (from the past 240 hour(s))  Urine culture     Status: None (Preliminary result)   Collection Time: 03/31/19  5:30 AM   Specimen: In/Out Cath Urine  Result Value Ref Range Status   Specimen Description   Final    IN/OUT CATH URINE Performed at [facility], [place] [address] [place], [place] [address]    Special Requests   Final    NONE Performed at [facility], [place] [address] [place], [place] [address]    Culture   Final    CULTURE REINCUBATED FOR BETTER GROWTH Performed at [facility], [place] [address], [place], [place] [address]    Report Status PENDING  Incomplete         Radiology Studies: DG Chest Port 1 View  Result Date: 03/30/2019 CLINICAL DATA:  Initial evaluation for acute fever, weakness. EXAM: PORTABLE CHEST 1 VIEW COMPARISON:  Prior radiograph from 08/27/2017. FINDINGS: Transverse heart size within normal limits. Mediastinal silhouette normal. Aortic atherosclerosis. Lungs normally inflated. Diffuse interstitial prominence seen throughout the lungs bilaterally, which could reflect mild interstitial congestion or possibly atypical/viral  pneumonitis. No consolidative opacity to suggest bronchopneumonia. No pleural effusion. No pneumothorax. No acute osseous finding. IMPRESSION: 1. Diffuse interstitial prominence throughout the lungs bilaterally, which could reflect mild interstitial congestion or possibly atypical/viral pneumonitis. No consolidative opacity to suggest bronchopneumonia. 2.  Aortic Atherosclerosis (ICD10-I70.0). Electronically Signed   By: Jeannine Boga M.D.   On: 03/30/2019 21:39        Scheduled Meds: . allopurinol  300 mg Oral Daily  . vitamin C  500 mg Oral Daily  . atorvastatin  10 mg Oral q1800  . insulin aspart  0-15 Units Subcutaneous TID WC  . insulin aspart  0-5 Units Subcutaneous QHS  . metoprolol tartrate  25 mg Oral BID  . multivitamin with minerals  1 tablet Oral  Daily  . rivaroxaban  20 mg Oral Daily  . zinc sulfate  220 mg Oral Daily   Continuous Infusions: . diltiazem (CARDIZEM) infusion 12.5 mg/hr (04/01/19 0500)  . remdesivir 100 mg in NS 100 mL 100 mg (04/01/19 1056)     LOS: 1 day    Time spent: 25 minutes  Zettie Cooley, MD [facility] Pager [phone]  If 7PM-7AM, please contact night-coverage [email or web address] Password [facility] 04/01/2019, 11:49 AM

## 2019-04-01 NOTE — ED Notes (Signed)
This Clinical research associate and [place], NT noticed patient on the floor in the corner. This Clinical research associate and a few other RN's and NT's safely got the patient off the floor and back into the bed. Pt was alert and was able to answer all questions asked by RN. Patient was put back into the bed, and instructed to use his call bell instead of getting out his bed alone.

## 2019-04-01 NOTE — ED Notes (Signed)
Called pharmacy for Cardizem medication replacement bag

## 2019-04-01 NOTE — ED Notes (Signed)
Pt heart rate trending upward hospitalist paged

## 2019-04-01 NOTE — ED Notes (Signed)
This RN assisted pt to stand an to bedside commode. Pt voided 200 ml urine and 1 episode loose BM. Pts gown and bedding changed

## 2019-04-02 ENCOUNTER — Inpatient Hospital Stay (HOSPITAL_COMMUNITY): Payer: Medicare Other

## 2019-04-02 LAB — CBC WITH DIFFERENTIAL/PLATELET
Abs Immature Granulocytes: 0.02 10*3/uL (ref 0.00–0.07)
Basophils Absolute: 0 10*3/uL (ref 0.0–0.1)
Basophils Relative: 0 %
Eosinophils Absolute: 0 10*3/uL (ref 0.0–0.5)
Eosinophils Relative: 0 %
HCT: 45.9 % (ref 39.0–52.0)
Hemoglobin: 15.8 g/dL (ref 13.0–17.0)
Immature Granulocytes: 0 %
Lymphocytes Relative: 13 %
Lymphs Abs: 0.8 10*3/uL (ref 0.7–4.0)
MCH: 30.4 pg (ref 26.0–34.0)
MCHC: 34.4 g/dL (ref 30.0–36.0)
MCV: 88.3 fL (ref 80.0–100.0)
Monocytes Absolute: 0.4 10*3/uL (ref 0.1–1.0)
Monocytes Relative: 6 %
Neutro Abs: 4.6 10*3/uL (ref 1.7–7.7)
Neutrophils Relative %: 81 %
Platelets: 95 10*3/uL — ABNORMAL LOW (ref 150–400)
RBC: 5.2 MIL/uL (ref 4.22–5.81)
RDW: 13.1 % (ref 11.5–15.5)
WBC: 5.8 10*3/uL (ref 4.0–10.5)
nRBC: 0 % (ref 0.0–0.2)

## 2019-04-02 LAB — D-DIMER, QUANTITATIVE: D-Dimer, Quant: 12.34 ug/mL-FEU — ABNORMAL HIGH (ref 0.00–0.50)

## 2019-04-02 LAB — COMPREHENSIVE METABOLIC PANEL
ALT: 57 U/L — ABNORMAL HIGH (ref 0–44)
AST: 115 U/L — ABNORMAL HIGH (ref 15–41)
Albumin: 3.4 g/dL — ABNORMAL LOW (ref 3.5–5.0)
Alkaline Phosphatase: 56 U/L (ref 38–126)
Anion gap: 11 (ref 5–15)
BUN: 25 mg/dL — ABNORMAL HIGH (ref 8–23)
CO2: 21 mmol/L — ABNORMAL LOW (ref 22–32)
Calcium: 8.2 mg/dL — ABNORMAL LOW (ref 8.9–10.3)
Chloride: 93 mmol/L — ABNORMAL LOW (ref 98–111)
Creatinine, Ser: 1.32 mg/dL — ABNORMAL HIGH (ref 0.61–1.24)
GFR calc Af Amer: >60 mL/min (ref >60)
GFR calc non Af Amer: 53 mL/min — ABNORMAL LOW (ref >60)
Glucose, Bld: 191 mg/dL — ABNORMAL HIGH (ref 70–99)
Potassium: 3.5 mmol/L (ref 3.5–5.1)
Sodium: 125 mmol/L — ABNORMAL LOW (ref 135–145)
Total Bilirubin: 0.7 mg/dL (ref 0.3–1.2)
Total Protein: 6.4 g/dL — ABNORMAL LOW (ref 6.5–8.1)

## 2019-04-02 LAB — GLUCOSE, CAPILLARY
Glucose-Capillary: 170 mg/dL — ABNORMAL HIGH (ref 70–99)
Glucose-Capillary: 216 mg/dL — ABNORMAL HIGH (ref 70–99)
Glucose-Capillary: 179 mg/dL — ABNORMAL HIGH (ref 70–99)

## 2019-04-02 LAB — C-REACTIVE PROTEIN: CRP: 6.4 mg/dL — ABNORMAL HIGH (ref <1.0)

## 2019-04-02 LAB — BRAIN NATRIURETIC PEPTIDE: B Natriuretic Peptide: 199.4 pg/mL — ABNORMAL HIGH (ref 0.0–100.0)

## 2019-04-02 LAB — PROCALCITONIN: Procalcitonin: 1.48 ng/mL

## 2019-04-02 MED ORDER — DEXAMETHASONE 6 MG PO TABS
6.0000 mg | ORAL_TABLET | Freq: Every day | ORAL | Status: DC
  Administered 2019-04-02 – 2019-04-06 (×5): 6 mg via ORAL
  Filled 2019-04-02 (×2): qty 1
  Filled 2019-04-02 (×2): qty 3
  Filled 2019-04-02 (×2): qty 1

## 2019-04-02 MED ORDER — SODIUM CHLORIDE 0.9 % IV SOLN: INTRAVENOUS | Status: DC

## 2019-04-02 MED ORDER — TOCILIZUMAB 200 MG/10ML IV SOLN
700.0000 mg | Freq: Once | INTRAVENOUS | Status: AC
  Administered 2019-04-02: 700 mg via INTRAVENOUS
  Filled 2019-04-02 (×2): qty 35

## 2019-04-02 MED ORDER — SODIUM CHLORIDE 0.9 % IV SOLN
1.0000 g | INTRAVENOUS | Status: AC
  Administered 2019-04-02 – 2019-04-06 (×5): 1 g via INTRAVENOUS
  Filled 2019-04-02 (×3): qty 10
  Filled 2019-04-02: qty 1
  Filled 2019-04-02 (×2): qty 10

## 2019-04-02 NOTE — Progress Notes (Signed)
PROGRESS NOTE    Jonathon Stevens  VZD:[id number] DOB: Feb 26, 1946 DOA: 03/30/2019 PCP: Delorse Lek, MD   Brief Narrative:  Jonathon Stevens is a 74 y.o. male with medical history significant of PAF on xarelto, prior stroke, prediabetes. Patient presented to ED with c/o generalized weakness, malaise, mild diarrhea.  No N/V, no SOB nor cough.  No known COVID exposures.  ED Course: Pt with Tm 102.3, A.Fib RVR with rate up to 150, no O2 requirement satting low 90s on RA. Put on cardizem gtt for rate control. COVID test is positive.  Interim history: Came off Dilt drip for greater than 12 hours on home dose of metoprolol but went back into Afib with RVR on night of 1/4. Placed back on dilt drip. Started remdesivir. On RA.  Fell overnight. CT head negative. Patient alert and oriented but continues ot try and get out of bed despite nursing and physician educating the patient about staying in bed.   Subjective: No acute complaints. Patient did fall and CT head was negative for any complications. Patient states he will not get up but has continued to have balance issues. PT and OT seeing, but patient educated about importance of not getting up.   Assessment & Plan:   Principal Problem:   Atrial fibrillation with RVR (HCC) Active Problems:   Prediabetes   Paroxysmal atrial fibrillation (HCC)   Anticoagulated   COVID-19 virus infection  1. A.Fib RVR - 1. Rate control with cardizem, will try to wean off today 2. Continue home metoprolol BID, will increase to 25mg  BID given persistence of cardizem drip 3. Consider increase to metop or start diltizem PO if patient does not come off dilt drip  4. Continue xarelto as AC 5. Tele monitor 2. COVID-19 1. COVID pathway 2. remdesivir day 3/5 3. New O2 requirement at this time 4. MVI, Zinc, Vit D, Vit C 5. Cont pulse ox 6. Daily labs, including inflammatory markers 7. Start decadron for 10 days 3. Acute Encephalopathy 1. Could be secondary to  Covid, Afib, or Hyponatremia. Will order fluids, continue covid treatment, and reorientation protocol. 2. Believe there is a strong aspect of sundowning and delirium given patient's hospital admission 3. Will correct Na with fluids, continue to watch 4. Prediabetes - 1. Mod scale SSI AC/HS  DVT prophylaxis: Xarelto Code Status: Full Family Communication: No family in room Disposition Plan: Home after admit Consults called: None Admission status: Place in obs   Objective: Vitals:   04/02/19 0600 04/02/19 0700 04/02/19 0800 04/02/19 1043  BP: 125/75 138/72 128/71 128/84  Pulse: 91 74 (!) 108 95  Resp: (!) 26 (!) 27 (!) 29   Temp:   97.8 F (36.6 C)   TempSrc:   Oral   SpO2: 93% (!) 89% 92%   Weight:      Height:        Intake/Output Summary (Last 24 hours) at 04/02/2019 1131 Last data filed at 04/02/2019 1051 Gross per 24 hour  Intake 696.51 ml  Output 475 ml  Net 221.51 ml   Filed Weights   03/30/19 2101 04/01/19 2135  Weight: 83.9 kg 84.6 kg    Examination:  General exam: Appears calm and comfortable, exam unchanged since yesterday, alert and oriented Respiratory system: Clear to auscultation. Respiratory effort normal. Cardiovascular system: S1 & S2 heard, Irregularly irregulary, rate < 100. No JVD, murmurs, rubs, gallops or clicks. No pedal edema. Gastrointestinal system: Abdomen is nondistended, soft and nontender. No organomegaly or masses felt. Normal bowel  sounds heard. Central nervous system: Alert and orientedx3. No focal neurological deficits. Extremities: Symmetric 5 x 5 power. Skin: No rashes, lesions or ulcers Psychiatry: Judgement and insight appear normal. Mood & affect appropriate.     Data Reviewed: I have personally reviewed following labs and imaging studies  CBC: Recent Labs  Lab 03/30/19 2146 03/30/19 2214 03/31/19 0500 04/01/19 0500 04/02/19 0224  WBC 5.5  --  6.0 4.4 5.8  NEUTROABS 4.5  --  4.9 3.1 4.6  HGB 15.5 16.0 15.5 15.5 15.8    HCT 46.0 47.0 46.3 45.4 45.9  MCV 91.8  --  91.7 89.5 88.3  PLT 133*  --  122* 95* 95*   Basic Metabolic Panel: Recent Labs  Lab 03/30/19 2146 03/30/19 2214 03/31/19 0500 04/01/19 0500 04/02/19 0224  NA 131* 132* 130* 128* 125*  K 3.9 3.7 3.9 3.4* 3.5  CL 97* 96* 97* 96* 93*  CO2 22  --  22 19* 21*  GLUCOSE 169* 165* 181* 150* 191*  BUN 18 18 21 22  25*  CREATININE 1.35* 1.40* 1.44* 1.21 1.32*  CALCIUM 8.7*  --  8.4* 8.3* 8.2*   GFR: Estimated Creatinine Clearance: 53.1 mL/min (A) (by C-G formula based on SCr of 1.32 mg/dL (H)). Liver Function Tests: Recent Labs  Lab 03/30/19 2146 03/31/19 0500 04/01/19 0500 04/02/19 0224  AST 35 36 85* 115*  ALT 30 29 42 57*  ALKPHOS 76 69 59 56  BILITOT 0.8 1.0 0.9 0.7  PROT 7.6 7.3 6.8 6.4*  ALBUMIN 4.1 4.1 4.0 3.4*   No results for input(s): LIPASE, AMYLASE in the last 168 hours. No results for input(s): AMMONIA in the last 168 hours. Coagulation Profile: Recent Labs  Lab 03/30/19 2146  INR 1.3*   Cardiac Enzymes: No results for input(s): CKTOTAL, CKMB, CKMBINDEX, TROPONINI in the last 168 hours. BNP (last 3 results) No results for input(s): PROBNP in the last 8760 hours. HbA1C: Recent Labs    03/31/19 0248  HGBA1C 6.6*   CBG: Recent Labs  Lab 04/01/19 0751 04/01/19 1205 04/01/19 1645 04/01/19 2130 04/02/19 0809  GLUCAP 136* 142* 147* 166* 170*   Lipid Profile: Recent Labs    03/30/19 2146  TRIG 46   Thyroid Function Tests: No results for input(s): TSH, T4TOTAL, FREET4, T3FREE, THYROIDAB in the last 72 hours. Anemia Panel: Recent Labs    03/30/19 2146  FERRITIN 891*   Sepsis Labs: Recent Labs  Lab 03/30/19 2146 03/31/19 0248  PROCALCITON <0.10 0.16  LATICACIDVEN 1.3 1.2    Recent Results (from the past 240 hour(s))  Blood Culture (routine x 2)     Status: None (Preliminary result)   Collection Time: 03/30/19  9:18 PM   Specimen: BLOOD  Result Value Ref Range Status   Specimen  Description   Final    BLOOD BLOOD LEFT FOREARM Performed at [facility], [address], [place], [place]    Special Requests   Final    BOTTLES DRAWN AEROBIC AND ANAEROBIC Blood Culture adequate volume Performed at [facility], [address], [place], [place]    Culture   Final    NO GROWTH 1 DAY Performed at [facility], [address] [place], [place]    Report Status PENDING  Incomplete  Blood Culture (routine x 2)     Status: None (Preliminary result)   Collection Time: 03/30/19  9:46 PM   Specimen: BLOOD  Result Value Ref Range  Status   Specimen Description   Final    BLOOD RIGHT ANTECUBITAL Performed at [facility] [address] [place], [place] [address]    Special Requests   Final    BOTTLES DRAWN AEROBIC AND ANAEROBIC Blood Culture adequate volume Performed at [facility] [address], [place], [place] [address]    Culture   Final    NO GROWTH 1 DAY Performed at [facility], [place] [address], [place], [place] [address]    Report Status PENDING  Incomplete  Urine culture     Status: Abnormal (Preliminary result)   Collection Time: 03/31/19  5:30 AM   Specimen: In/Out Cath Urine  Result Value Ref Range Status   Specimen Description   Final    IN/OUT CATH URINE Performed at [facility] [address], [place], [place] [address]    Special Requests   Final    NONE Performed at [facility], [place] [address] [place], [place] [address]    Culture (A)  Final    300 COLONIES/mL STAPHYLOCOCCUS EPIDERMIDIS SUSCEPTIBILITIES TO FOLLOW Performed at [facility], [place] [address], [place], [place] [address]    Report Status PENDING  Incomplete  MRSA PCR Screening     Status: None   Collection Time: 04/01/19  9:30 PM   Specimen: Nasopharyngeal  Result Value Ref Range Status   MRSA by PCR NEGATIVE  NEGATIVE Final    Comment:        The GeneXpert MRSA Assay (FDA approved for NASAL specimens only), is one component of a comprehensive MRSA colonization surveillance program. It is not intended to diagnose MRSA infection nor to guide or monitor treatment for MRSA infections. Performed at [facility], [place] [address], [place], [place] [address]          Radiology Studies: CT HEAD WO CONTRAST  Result Date: 04/01/2019 CLINICAL DATA:  Status post trauma. EXAM: CT HEAD WITHOUT CONTRAST TECHNIQUE: Contiguous axial images were obtained from the base of the skull through the vertex without intravenous contrast. COMPARISON:  October 07, 2013 FINDINGS: Brain: There is mild cerebral atrophy with widening of the extra-axial spaces and ventricular dilatation. There are areas of decreased attenuation within the white matter tracts of the supratentorial brain, consistent with microvascular disease changes. Vascular: No hyperdense vessel or unexpected calcification. Skull: Normal. Negative for fracture or focal lesion. Sinuses/Orbits: There is moderate severity bilateral ethmoid sinus mucosal thickening, left slightly greater than right. Other: None. IMPRESSION: 1. No acute intracranial abnormality. 2. 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 3. Moderate severity bilateral ethmoid sinus disease. Electronically Signed   By: Virgina Norfolk M.D.   On: 04/01/2019 21:18        Scheduled Meds: . allopurinol  300 mg Oral Daily  . vitamin C  500 mg Oral Daily  . atorvastatin  10 mg Oral q1800  . Chlorhexidine Gluconate Cloth  6 each Topical Q0600  . insulin aspart  0-15 Units Subcutaneous TID WC  . insulin aspart  0-5 Units Subcutaneous QHS  . mouth rinse  15 mL Mouth Rinse BID  . metoprolol tartrate  25 mg Oral BID  . multivitamin with minerals  1 tablet Oral Daily  . rivaroxaban  20 mg Oral Daily  . zinc sulfate  220 mg Oral Daily    Continuous Infusions: . sodium chloride 75 mL/hr at 04/02/19 0825  . diltiazem (CARDIZEM) infusion 12.5  mg/hr (04/02/19 0600)  . remdesivir 100 mg in NS 100 mL Stopped (04/01/19 1421)     LOS: 2 days    Time spent: 25 minutes  Zettie Cooley, MD [facility] Pager [phone]  If 7PM-7AM, please contact night-coverage [email or web address] Password [facility] 04/02/2019, 11:31 AM

## 2019-04-02 NOTE — Evaluation (Signed)
Physical Therapy Evaluation Patient Details Name: Jonathon Stevens MRN: [id number] DOB: 14-Feb-1946 Today's Date: 04/02/2019   History of Present Illness  74 y.o. male with medical history significant of PAF on xarelto, prior stroke, prediabetes and admitted for afib with RVR, COVID (+)  Clinical Impression  Pt admitted with above diagnosis.  Pt currently with functional limitations due to the deficits listed below (see PT Problem List). Pt will benefit from skilled PT to increase their independence and safety with mobility to allow discharge to the venue listed below.  Pt assisted with standing and able to march in place, performed x3.  Pt remained on HFNC and RN in room to switch to mask for transfer (pt to go to [employer] per [employer]).  Pt tolerated movement well and only reports soreness from fall earlier.     Follow Up Recommendations Home health PT;Supervision for mobility/OOB    Equipment Recommendations  Rolling walker with 5" wheels    Recommendations for Other Services       Precautions / Restrictions Precautions Precautions: Fall Precaution Comments: monitor HR and sats      Mobility  Bed Mobility Overal bed mobility: Needs Assistance Bed Mobility: Supine to Sit;Sit to Supine     Supine to sit: Min guard Sit to supine: Min guard   General bed mobility comments: min/guard for safety, lines  Transfers Overall transfer level: Needs assistance Equipment used: 1 person hand held assist Transfers: Sit to/from Stand Sit to Stand: Min assist         General transfer comment: assist to rise and steady, verbal cues for safety, pt with posterior lean of legs against bed to self assist; pt performed standing x3 for "stretching"; reports helping his back pain; pt remained on 10L HFNC and SPO2 89-90%, HR 70-80s bpm  Ambulation/Gait                Stairs            Wheelchair Mobility    Modified Rankin (Stroke Patients Only)       Balance Overall  balance assessment: History of Falls(has fallen twice since arrival to hospital)                                           Pertinent Vitals/Pain Pain Assessment: 0-10 Pain Score: 6  Pain Location: mid back Pain Descriptors / Indicators: Sore Pain Intervention(s): Limited activity within patient's tolerance;Repositioned    Home Living Family/patient expects to be discharged to:: Private residence Living Arrangements: Spouse/significant other Available Help at Discharge: Family;Available PRN/intermittently Type of Home: House Home Access: Stairs to enter Entrance Stairs-Rails: None Entrance Stairs-Number of Steps: 3 Home Layout: One level Home Equipment: Cane - single point      Prior Function Level of Independence: Independent               Hand Dominance        Extremity/Trunk Assessment        Lower Extremity Assessment Lower Extremity Assessment: Generalized weakness       Communication   Communication: No difficulties  Cognition Arousal/Alertness: Awake/alert Behavior During Therapy: WFL for tasks assessed/performed Overall Cognitive Status: Within Functional Limits for tasks assessed  General Comments      Exercises     Assessment/Plan    PT Assessment Patient needs continued PT services  PT Problem List Decreased strength;Decreased mobility;Decreased activity tolerance;Decreased balance;Decreased knowledge of use of DME       PT Treatment Interventions DME instruction;Therapeutic activities;Gait training;Therapeutic exercise;Patient/family education;Functional mobility training;Balance training;Stair training    PT Goals (Current goals can be found in the Care Plan section)  Acute Rehab PT Goals PT Goal Formulation: With patient Time For Goal Achievement: 04/16/19 Potential to Achieve Goals: Good    Frequency Min 3X/week   Barriers to discharge         Co-evaluation               AM-PAC PT "6 Clicks" Mobility  Outcome Measure Help needed turning from your back to your side while in a flat bed without using bedrails?: A Little Help needed moving from lying on your back to sitting on the side of a flat bed without using bedrails?: A Little Help needed moving to and from a bed to a chair (including a wheelchair)?: A Little Help needed standing up from a chair using your arms (e.g., wheelchair or bedside chair)?: A Little Help needed to walk in hospital room?: A Lot Help needed climbing 3-5 steps with a railing? : A Lot 6 Click Score: 16    End of Session Equipment Utilized During Treatment: Gait belt Activity Tolerance: Patient tolerated treatment well Patient left: with call bell/phone within reach;in bed;with bed alarm set Nurse Communication: Mobility status PT Visit Diagnosis: Difficulty in walking, not elsewhere classified (R26.2);Unsteadiness on feet (R26.81)    Time: 1856-3149 PT Time Calculation (min) (ACUTE ONLY): 19 min   Charges:   PT Evaluation $PT Eval Low Complexity: 1 Low         Kati PT, DPT Acute Rehabilitation Services Office: [phone]  Trena Platt 04/02/2019, 4:54 PM

## 2019-04-03 DIAGNOSIS — I4891 Unspecified atrial fibrillation: Secondary | ICD-10-CM

## 2019-04-03 LAB — COMPREHENSIVE METABOLIC PANEL
ALT: 53 U/L — ABNORMAL HIGH (ref 0–44)
AST: 86 U/L — ABNORMAL HIGH (ref 15–41)
Albumin: 3.2 g/dL — ABNORMAL LOW (ref 3.5–5.0)
Alkaline Phosphatase: 53 U/L (ref 38–126)
Anion gap: 12 (ref 5–15)
BUN: 25 mg/dL — ABNORMAL HIGH (ref 8–23)
CO2: 20 mmol/L — ABNORMAL LOW (ref 22–32)
Calcium: 8.1 mg/dL — ABNORMAL LOW (ref 8.9–10.3)
Chloride: 97 mmol/L — ABNORMAL LOW (ref 98–111)
Creatinine, Ser: 1.14 mg/dL (ref 0.61–1.24)
GFR calc Af Amer: >60 mL/min (ref >60)
GFR calc non Af Amer: >60 mL/min (ref >60)
Glucose, Bld: 195 mg/dL — ABNORMAL HIGH (ref 70–99)
Potassium: 4.2 mmol/L (ref 3.5–5.1)
Sodium: 129 mmol/L — ABNORMAL LOW (ref 135–145)
Total Bilirubin: 1.2 mg/dL (ref 0.3–1.2)
Total Protein: 6.2 g/dL — ABNORMAL LOW (ref 6.5–8.1)

## 2019-04-03 LAB — URINE CULTURE: Culture: 300 — AB

## 2019-04-03 LAB — CBC WITH DIFFERENTIAL/PLATELET
Abs Immature Granulocytes: 0.01 10*3/uL (ref 0.00–0.07)
Basophils Absolute: 0 10*3/uL (ref 0.0–0.1)
Basophils Relative: 0 %
Eosinophils Absolute: 0 10*3/uL (ref 0.0–0.5)
Eosinophils Relative: 0 %
HCT: 43.7 % (ref 39.0–52.0)
Hemoglobin: 15 g/dL (ref 13.0–17.0)
Immature Granulocytes: 0 %
Lymphocytes Relative: 19 %
Lymphs Abs: 0.5 10*3/uL — ABNORMAL LOW (ref 0.7–4.0)
MCH: 30.1 pg (ref 26.0–34.0)
MCHC: 34.3 g/dL (ref 30.0–36.0)
MCV: 87.6 fL (ref 80.0–100.0)
Monocytes Absolute: 0.2 10*3/uL (ref 0.1–1.0)
Monocytes Relative: 7 %
Neutro Abs: 1.9 10*3/uL (ref 1.7–7.7)
Neutrophils Relative %: 74 %
Platelets: 97 10*3/uL — ABNORMAL LOW (ref 150–400)
RBC: 4.99 MIL/uL (ref 4.22–5.81)
RDW: 13 % (ref 11.5–15.5)
WBC: 2.6 10*3/uL — ABNORMAL LOW (ref 4.0–10.5)
nRBC: 0 % (ref 0.0–0.2)

## 2019-04-03 LAB — PROCALCITONIN: Procalcitonin: 0.8 ng/mL

## 2019-04-03 LAB — C-REACTIVE PROTEIN: CRP: 5.3 mg/dL — ABNORMAL HIGH (ref <1.0)

## 2019-04-03 LAB — GLUCOSE, CAPILLARY
Glucose-Capillary: 263 mg/dL — ABNORMAL HIGH (ref 70–99)
Glucose-Capillary: 200 mg/dL — ABNORMAL HIGH (ref 70–99)
Glucose-Capillary: 267 mg/dL — ABNORMAL HIGH (ref 70–99)
Glucose-Capillary: 234 mg/dL — ABNORMAL HIGH (ref 70–99)

## 2019-04-03 LAB — D-DIMER, QUANTITATIVE: D-Dimer, Quant: 2.99 ug/mL-FEU — ABNORMAL HIGH (ref 0.00–0.50)

## 2019-04-03 MED ORDER — METOPROLOL TARTRATE 25 MG PO TABS
25.0000 mg | ORAL_TABLET | Freq: Two times a day (BID) | ORAL | Status: DC
  Administered 2019-04-03: 25 mg via ORAL
  Filled 2019-04-03: qty 1

## 2019-04-03 NOTE — Progress Notes (Signed)
PROGRESS NOTE    Jonathon Stevens  FWY:[id number] DOB: 01-11-1946 DOA: 03/30/2019 PCP: Delorse Lek, MD   Brief Narrative:  Jonathon Stevens is a 74 y.o. male with medical history significant of PAF on xarelto, prior stroke, prediabetes. Patient presented to ED with c/o generalized weakness, malaise, mild diarrhea.  No N/V, no SOB nor cough.  No known COVID exposures.  ED Course: Pt with Tm 102.3, A.Fib RVR with rate up to 150, no O2 requirement satting low 90s on RA. Put on cardizem gtt for rate control. COVID test is positive.  Interim history: Came off Dilt drip for greater than 12 hours on home dose of metoprolol but went back into Afib with RVR on night of 1/4. Placed back on dilt drip. Started remdesivir. On RA.  Fell overnight. CT head negative. Patient alert and oriented but continues ot try and get out of bed despite nursing and physician educating the patient about staying in bed.  1/6 worsening respiratory Status. CXR worsened. Started on actemra, decadron, Ceftriaxone given elevated procalcitonin. HFNC up to 12L  Subjective: No acute complaints. Is sitting up in his char, reading on his laptop. No more recurrent falls. No further issues. Has no complaints today except he still feels lousy. Tolerating his medications well so far. No signs of confusion or issues overnight.  Assessment & Plan:   Principal Problem:   Atrial fibrillation with RVR (HCC) Active Problems:   Prediabetes   Paroxysmal atrial fibrillation (HCC)   Anticoagulated   COVID-19 virus infection  1. A.Fib RVR - 1. Rate control with cardizem 2. Continue metoprolol BID 25mg  BID given persistence of cardizem drip 3. Consider increase to metop or start diltizem PO if patient does not come off dilt drip 4. Continue xarelto as AC 5. Tele monitor 2. COVID-19 1. COVID pathway 2. remdesivir day 4/5 3. Given actemra on 1/6 4. Started ceftriaxone 1/6-1/13 5. Inc O2 requirement at this time 6. MVI, Zinc, Vit  D, Vit C 7. Cont pulse ox 8. Daily labs, including inflammatory markers 9. Cont decadron for 10 days 3. Acute Encephalopathy, Resolved 4. Hypnatremia 1. Has improved since yesterday with fluids, now ~129 5. Prediabetes - 1. Mod scale SSI AC/HS  DVT prophylaxis: Xarelto Code Status: Full Family Communication: No family in room Disposition Plan: Home after admit Consults called: None Admission status: Cont INPT admission, pending transfer to [facility] if bed becomes available   Objective: Vitals:   04/03/19 0600 04/03/19 0700 04/03/19 0800 04/03/19 0830  BP: 130/78 117/79 111/69 111/69  Pulse: 88 98 (!) 115 91  Resp: 17 (!) 27 17   Temp:   (!) 97 F (36.1 C)   TempSrc:   Axillary   SpO2: 91% 96% 93%   Weight:      Height:        Intake/Output Summary (Last 24 hours) at 04/03/2019 1100 Last data filed at 04/03/2019 0800 Gross per 24 hour  Intake 2355.5 ml  Output 2650 ml  Net -294.5 ml   Filed Weights   03/30/19 2101 04/01/19 2135  Weight: 83.9 kg 84.6 kg    Examination:  General exam: Appears calm and comfortable, sitting up ib chair Respiratory system: Clear to auscultation. Respiratory effort normal. Cardiovascular system: S1 & S2 heard, Irregularly irregular rhythm, rate < 100. No JVD, murmurs, rubs, gallops or clicks. No pedal edema. Gastrointestinal system: Abdomen is nondistended, soft and nontender. No organomegaly or masses felt. Normal bowel sounds heard. Central nervous system: Alert and orientedx3. No focal  neurological deficits. Extremities: Symmetric 5 x 5 power. Skin: No rashes, lesions or ulcers Psychiatry: Judgement and insight appear normal. Mood & affect appropriate.    Data Reviewed: I have personally reviewed following labs and imaging studies  CBC: Recent Labs  Lab 03/30/19 2146 03/30/19 2214 03/31/19 0500 04/01/19 0500 04/02/19 0224 04/03/19 0202  WBC 5.5  --  6.0 4.4 5.8 2.6*  NEUTROABS 4.5  --  4.9 3.1 4.6 1.9  HGB 15.5 16.0 15.5 15.5  15.8 15.0  HCT 46.0 47.0 46.3 45.4 45.9 43.7  MCV 91.8  --  91.7 89.5 88.3 87.6  PLT 133*  --  122* 95* 95* 97*   Basic Metabolic Panel: Recent Labs  Lab 03/30/19 2146 03/30/19 2214 03/31/19 0500 04/01/19 0500 04/02/19 0224 04/03/19 0202  NA 131* 132* 130* 128* 125* 129*  K 3.9 3.7 3.9 3.4* 3.5 4.2  CL 97* 96* 97* 96* 93* 97*  CO2 22  --  22 19* 21* 20*  GLUCOSE 169* 165* 181* 150* 191* 195*  BUN 18 18 21 22  25* 25*  CREATININE 1.35* 1.40* 1.44* 1.21 1.32* 1.14  CALCIUM 8.7*  --  8.4* 8.3* 8.2* 8.1*   GFR: Estimated Creatinine Clearance: 61.5 mL/min (by C-G formula based on SCr of 1.14 mg/dL). Liver Function Tests: Recent Labs  Lab 03/30/19 2146 03/31/19 0500 04/01/19 0500 04/02/19 0224 04/03/19 0202  AST 35 36 85* 115* 86*  ALT 30 29 42 57* 53*  ALKPHOS 76 69 59 56 53  BILITOT 0.8 1.0 0.9 0.7 1.2  PROT 7.6 7.3 6.8 6.4* 6.2*  ALBUMIN 4.1 4.1 4.0 3.4* 3.2*   No results for input(s): LIPASE, AMYLASE in the last 168 hours. No results for input(s): AMMONIA in the last 168 hours. Coagulation Profile: Recent Labs  Lab 03/30/19 2146  INR 1.3*   Cardiac Enzymes: No results for input(s): CKTOTAL, CKMB, CKMBINDEX, TROPONINI in the last 168 hours. BNP (last 3 results) No results for input(s): PROBNP in the last 8760 hours. HbA1C: No results for input(s): HGBA1C in the last 72 hours. CBG: Recent Labs  Lab 04/02/19 0809 04/02/19 1200 04/02/19 1641 04/02/19 2129 04/03/19 0803  GLUCAP 170* 216* 179* 263* 200*   Lipid Profile: No results for input(s): CHOL, HDL, LDLCALC, TRIG, CHOLHDL, LDLDIRECT in the last 72 hours. Thyroid Function Tests: No results for input(s): TSH, T4TOTAL, FREET4, T3FREE, THYROIDAB in the last 72 hours. Anemia Panel: No results for input(s): VITAMINB12, FOLATE, FERRITIN, TIBC, IRON, RETICCTPCT in the last 72 hours. Sepsis Labs: Recent Labs  Lab 03/30/19 2146 03/31/19 0248 04/02/19 0224 04/03/19 0202  PROCALCITON <0.10 0.16 1.48 0.80    LATICACIDVEN 1.3 1.2  --   --     Recent Results (from the past 240 hour(s))  Blood Culture (routine x 2)     Status: None (Preliminary result)   Collection Time: 03/30/19  9:18 PM   Specimen: BLOOD LEFT FOREARM  Result Value Ref Range Status   Specimen Description   Final    BLOOD LEFT FOREARM Performed at [facility], [address], [place], [place]    Special Requests   Final    BOTTLES DRAWN AEROBIC AND ANAEROBIC Blood Culture adequate volume Performed at [facility], [address], [place], [place]    Culture   Final    NO GROWTH 2 DAYS Performed at [facility], [address], [place], [place]    Report Status PENDING  Incomplete  Blood Culture (routine x 2)     Status: None (Preliminary result)   Collection Time: 03/30/19  9:46 PM   Specimen: BLOOD  Result Value Ref Range Status   Specimen Description   Final    BLOOD RIGHT ANTECUBITAL Performed at [facility], [address] [place], [place] [address]    Special Requests   Final    BOTTLES DRAWN AEROBIC AND ANAEROBIC Blood Culture adequate volume Performed at [facility], [address] [place], [place] [address]    Culture   Final    NO GROWTH 2 DAYS Performed at [facility], [address] [place], [place] [address]    Report Status PENDING  Incomplete  Urine culture     Status: Abnormal   Collection Time: 03/31/19  5:30 AM   Specimen: In/Out Cath Urine  Result Value Ref Range Status   Specimen Description   Final    IN/OUT CATH URINE Performed at [facility], [address] [place], [place] [address]    Special Requests   Final    NONE Performed at [facility], [address], [place], [place] [address]    Culture 300 COLONIES/mL STAPHYLOCOCCUS EPIDERMIDIS (A)  Final   Report Status 04/03/2019 FINAL  Final   Organism ID, Bacteria STAPHYLOCOCCUS  EPIDERMIDIS (A)  Final      Susceptibility   Staphylococcus epidermidis - MIC*    CIPROFLOXACIN <=0.5 SENSITIVE Sensitive     GENTAMICIN <=0.5 SENSITIVE Sensitive     NITROFURANTOIN <=16 SENSITIVE Sensitive     OXACILLIN <=0.25 SENSITIVE Sensitive     TETRACYCLINE <=1 SENSITIVE Sensitive     VANCOMYCIN 1 SENSITIVE Sensitive     TRIMETH/SULFA <=10 SENSITIVE Sensitive     CLINDAMYCIN <=0.25 SENSITIVE Sensitive     RIFAMPIN <=0.5 SENSITIVE Sensitive     Inducible Clindamycin NEGATIVE Sensitive     * 300 COLONIES/mL STAPHYLOCOCCUS EPIDERMIDIS  MRSA PCR Screening     Status: None   Collection Time: 04/01/19  9:30 PM   Specimen: Nasopharyngeal  Result Value Ref Range Status   MRSA by PCR NEGATIVE NEGATIVE Final    Comment:        The GeneXpert MRSA Assay (FDA approved for NASAL specimens only), is one component of a comprehensive MRSA colonization surveillance program. It is not intended to diagnose MRSA infection nor to guide or monitor treatment for MRSA infections. Performed at [facility], [address] [place], [place] [address]          Radiology Studies: DG Chest 1 View  Result Date: 04/02/2019 CLINICAL DATA:  COVID positive, former smoker EXAM: CHEST  1 VIEW COMPARISON:  Radiograph 03/30/2019 FINDINGS: Normal mediastinum and cardiac silhouette. There is peripheral fine airspace opacities LEFT and RIGHT lung which are increased slightly from comparison exam. No pleural fluid. No pneumothorax. No acute osseous abnormality. IMPRESSION: Increasing fine peripheral airspace disease. Findings are consistent with mild worsening of COVID pneumonia. Electronically Signed   By: Genevive Bi M.D.   On: 04/02/2019 14:12   CT HEAD WO CONTRAST  Result Date: 04/01/2019 CLINICAL DATA:  Status post trauma. EXAM: CT HEAD WITHOUT CONTRAST TECHNIQUE: Contiguous axial images were obtained from the base of the skull through the vertex without intravenous contrast.  COMPARISON:  October 07, 2013 FINDINGS: Brain: There is mild cerebral atrophy with widening of the extra-axial spaces and ventricular dilatation. There are areas of decreased attenuation within the white matter tracts  of the supratentorial brain, consistent with microvascular disease changes. Vascular: No hyperdense vessel or unexpected calcification. Skull: Normal. Negative for fracture or focal lesion. Sinuses/Orbits: There is moderate severity bilateral ethmoid sinus mucosal thickening, left slightly greater than right. Other: None. IMPRESSION: 1. No acute intracranial abnormality. 2. 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 3. Moderate severity bilateral ethmoid sinus disease. Electronically Signed   By: Virgina Norfolk M.D.   On: 04/01/2019 21:18        Scheduled Meds: . allopurinol  300 mg [place] Daily  . vitamin C  500 mg Oral Daily  . atorvastatin  10 mg Oral q1800  . Chlorhexidine Gluconate Cloth  6 each Topical Q0600  . dexamethasone  6 mg Oral Daily  . insulin aspart  0-15 Units Subcutaneous TID WC  . insulin aspart  0-5 Units Subcutaneous QHS  . mouth rinse  15 mL Mouth Rinse BID  . metoprolol tartrate  25 mg Oral BID  . multivitamin with minerals  1 tablet Oral Daily  . rivaroxaban  20 mg Oral Daily  . zinc sulfate  220 mg Oral Daily   Continuous Infusions: . cefTRIAXone (ROCEPHIN)  IV Stopped (04/02/19 2156)  . diltiazem (CARDIZEM) infusion 5 mg/hr (04/03/19 0826)  . remdesivir 100 mg in NS 100 mL Stopped (04/03/19 0902)     LOS: 3 days   Time spent: 20 minutes  Arma Heading, MD [facility]  If 7PM-7AM, please contact night-coverage [email or web address] Password [email or web address] 04/03/2019, 11:00 AM

## 2019-04-03 NOTE — Progress Notes (Signed)
Brief transfer note, seen earlier today by Dr. Carlena Sax at [facility].  Subjective: Awake/alert-feels better-denies any shortness of breath.  Objective: Blood pressure 119/75, pulse 85, temperature (!) 97.3 F (36.3 C), temperature source [place], resp. rate 18, height 5\' 11"  (1.803 m), weight 84.6 kg, SpO2 94 %.  Chest: Appears clear to auscultation anteriorly CVS: S1-S2 regular Abdomen: Soft nontender nondistended Extremities: No edema Neurology: Awake and alert-nonfocal exam.   Recent Labs    04/01/19 0500 04/02/19 0224 04/03/19 0202  DDIMER 2.93* 12.34* 2.99*  CRP 5.9* 6.4* 5.3*    No results found for: SARSCOV2NAA   Assessment with plan: Acute hypoxemic respiratory failure secondary to COVID-19 pneumonia: Appears comfortable-although was on 13 L of oxygen when he first arrived here-he was titrated down to around 8-9 L.  He seems comfortable.  Continue steroids and remdesivir. Although unlikely to have concurrent bacterial PNA-remains on empric Rocephin due to mildly elevated procalcitonin.  A. fib with RVR: Rate controlled-with Cardizem infusion-continue telemetry monitoring.  Have asked RN to see if we can titrate off Cardizem gtt today-give metoprolol early. Continue Xarelto.  Hyponatremia: Appears euvolemic-begin fluid restriction.  Acute metabolic encephalopathy: resolved-awake/alert this afternoon. Probably 2/2 to covid hypoxia

## 2019-04-03 NOTE — Progress Notes (Signed)
1420Pt arrived via stretcher EMS.  Pt alert talkative, oriented x2  Pt corrected as to what faciliy hes at..  Pt follows commands effectively  Pt VSS  Pt currentl in AFIB.  Pt has Cardizem gtt at 5 Pt HR 80's Pt on 15 L per [place] or 15L Non Rebreather

## 2019-04-04 DIAGNOSIS — J9601 Acute respiratory failure with hypoxia: Secondary | ICD-10-CM

## 2019-04-04 DIAGNOSIS — E871 Hypo-osmolality and hyponatremia: Secondary | ICD-10-CM

## 2019-04-04 DIAGNOSIS — J1282 Pneumonia due to coronavirus disease 2019: Secondary | ICD-10-CM

## 2019-04-04 LAB — D-DIMER, QUANTITATIVE: D-Dimer, Quant: 2.18 ug/mL-FEU — ABNORMAL HIGH (ref 0.00–0.50)

## 2019-04-04 LAB — COMPREHENSIVE METABOLIC PANEL
ALT: 56 U/L — ABNORMAL HIGH (ref 0–44)
AST: 65 U/L — ABNORMAL HIGH (ref 15–41)
Albumin: 3.3 g/dL — ABNORMAL LOW (ref 3.5–5.0)
Alkaline Phosphatase: 60 U/L (ref 38–126)
Anion gap: 12 (ref 5–15)
BUN: 29 mg/dL — ABNORMAL HIGH (ref 8–23)
CO2: 23 mmol/L (ref 22–32)
Calcium: 8.7 mg/dL — ABNORMAL LOW (ref 8.9–10.3)
Chloride: 99 mmol/L (ref 98–111)
Creatinine, Ser: 1.05 mg/dL (ref 0.61–1.24)
GFR calc Af Amer: >60 mL/min (ref >60)
GFR calc non Af Amer: >60 mL/min (ref >60)
Glucose, Bld: 160 mg/dL — ABNORMAL HIGH (ref 70–99)
Potassium: 4.2 mmol/L (ref 3.5–5.1)
Sodium: 134 mmol/L — ABNORMAL LOW (ref 135–145)
Total Bilirubin: 0.8 mg/dL (ref 0.3–1.2)
Total Protein: 6.4 g/dL — ABNORMAL LOW (ref 6.5–8.1)

## 2019-04-04 LAB — CBC WITH DIFFERENTIAL/PLATELET
Abs Immature Granulocytes: 0.04 10*3/uL (ref 0.00–0.07)
Basophils Absolute: 0 10*3/uL (ref 0.0–0.1)
Basophils Relative: 0 %
Eosinophils Absolute: 0 10*3/uL (ref 0.0–0.5)
Eosinophils Relative: 0 %
HCT: 43.9 % (ref 39.0–52.0)
Hemoglobin: 15.5 g/dL (ref 13.0–17.0)
Immature Granulocytes: 1 %
Lymphocytes Relative: 10 %
Lymphs Abs: 0.7 10*3/uL (ref 0.7–4.0)
MCH: 30.9 pg (ref 26.0–34.0)
MCHC: 35.3 g/dL (ref 30.0–36.0)
MCV: 87.6 fL (ref 80.0–100.0)
Monocytes Absolute: 0.4 10*3/uL (ref 0.1–1.0)
Monocytes Relative: 6 %
Neutro Abs: 5.4 10*3/uL (ref 1.7–7.7)
Neutrophils Relative %: 83 %
Platelets: 127 10*3/uL — ABNORMAL LOW (ref 150–400)
RBC: 5.01 MIL/uL (ref 4.22–5.81)
RDW: 13 % (ref 11.5–15.5)
WBC: 6.6 10*3/uL (ref 4.0–10.5)
nRBC: 0 % (ref 0.0–0.2)

## 2019-04-04 LAB — GLUCOSE, CAPILLARY
Glucose-Capillary: 201 mg/dL — ABNORMAL HIGH (ref 70–99)
Glucose-Capillary: 224 mg/dL — ABNORMAL HIGH (ref 70–99)
Glucose-Capillary: 162 mg/dL — ABNORMAL HIGH (ref 70–99)
Glucose-Capillary: 237 mg/dL — ABNORMAL HIGH (ref 70–99)
Glucose-Capillary: 252 mg/dL — ABNORMAL HIGH (ref 70–99)

## 2019-04-04 LAB — PROCALCITONIN: Procalcitonin: 0.44 ng/mL

## 2019-04-04 LAB — C-REACTIVE PROTEIN: CRP: 3.1 mg/dL — ABNORMAL HIGH (ref <1.0)

## 2019-04-04 MED ORDER — FAMOTIDINE 20 MG PO TABS
20.0000 mg | ORAL_TABLET | Freq: Every day | ORAL | Status: DC
  Administered 2019-04-04 – 2019-04-10 (×7): 20 mg via ORAL
  Filled 2019-04-04 (×8): qty 1

## 2019-04-04 MED ORDER — METOPROLOL TARTRATE 50 MG PO TABS
50.0000 mg | ORAL_TABLET | Freq: Two times a day (BID) | ORAL | Status: DC
  Administered 2019-04-04 – 2019-04-08 (×9): 50 mg via ORAL
  Filled 2019-04-04 (×10): qty 1

## 2019-04-04 MED ORDER — FUROSEMIDE 10 MG/ML IJ SOLN
40.0000 mg | Freq: Once | INTRAMUSCULAR | Status: AC
  Administered 2019-04-04: 40 mg via INTRAVENOUS
  Filled 2019-04-04: qty 4

## 2019-04-04 NOTE — Progress Notes (Addendum)
1900 Report received from [facility].   2100 Call to pt wife [facility], updated her on plan of care.  0300 Pt has gotten out of bed multiple times throughout night setting off bed alarm. Pt unsteady on feet. Helped back to bed. Re oriented to room. Pt hallucinating rabbits on the wall. Pt tore out IV, new IV placed. Bed alarmed. Call bell in reach.  0730 Report given to [employer]

## 2019-04-04 NOTE — Progress Notes (Signed)
Physical Therapy Treatment Patient Details Name: Jonathon Stevens MRN: [id number] DOB: [birth year] Today's Date: 04/04/2019    History of Present Illness 74 y.o. male with medical history significant of PAF on xarelto, prior stroke, prediabetes and admitted for afib with RVR, COVID (+). Pt with acute metabolic encephalopathy; head CT negative for acute abnormality, mild cerebral atrophy and microvascular disease changes.   PT Comments    Pt progressing well with mobility. Able to progress ambulation into hallway, pt's stability much improved with RW, although continues to require intermittent minA for balance. Pt limited by intermittent confusion, decreased safety awareness, poor attention and impaired problem solving; hopefully this continues to improve. Spoke with wife Olegario Messier) on phone regarding pt's current status and d/c recs.   SpO2 down to 86% on 6L O2, quickly returning to >/90% with rest     Follow Up Recommendations  Home health PT;Supervision for mobility/OOB     Equipment Recommendations  Rolling walker with 5" wheels    Recommendations for Other Services       Precautions / Restrictions Precautions Precautions: Fall Restrictions Weight Bearing Restrictions: No    Mobility  Bed Mobility Overal bed mobility: Modified Independent Bed Mobility: Supine to Sit;Sit to Supine              Transfers Overall transfer level: Needs assistance Equipment used: None;Rolling walker (2 wheeled) Transfers: Sit to/from Stand Sit to Stand: Min guard         General transfer comment: Initial unsteadiness upon standing pt able to self-correct with UE support on hand rail; impulsive to sit leaving RW behind requiring cues for safety  Ambulation/Gait Ambulation/Gait assistance: Min guard;Min assist Gait Distance (Feet): 120 Feet Assistive device: None;Rolling walker (2 wheeled) Gait Pattern/deviations: Step-through pattern;Decreased stride length;Staggering right;Staggering  left   Gait velocity interpretation: 1.31 - 2.62 ft/sec, indicative of limited community ambulator General Gait Details: Slow, mildly unsteady gait, stability improved with RW, pt still requiring intermittent minA to prevent LOB especially with turns. initial cues to maintain closer proximity with RW. Seems primarily limited by poor attention/decreased cognition as opposed to strength   Stairs             Wheelchair Mobility    Modified Rankin (Stroke Patients Only)       Balance Overall balance assessment: Needs assistance;History of Falls   Sitting balance-Leahy Scale: Good       Standing balance-Leahy Scale: Fair                              Cognition Arousal/Alertness: Awake/alert Behavior During Therapy: WFL for tasks assessed/performed;Impulsive Overall Cognitive Status: Impaired/Different from baseline Area of Impairment: Orientation;Attention;Following commands;Safety/judgement;Awareness;Problem solving                 Orientation Level: Disoriented to;Place Current Attention Level: Selective   Following Commands: Follows one step commands consistently;Follows multi-step commands inconsistently Safety/Judgement: Decreased awareness of safety;Decreased awareness of deficits Awareness: Intellectual;Emergent Problem Solving: Difficulty sequencing;Requires verbal cues General Comments: . Able to anticipate some home needs regarding fact that he needs to stay quarantined from wife but may still need some help      Exercises      General Comments General comments (skin integrity, edema, etc.): Spoke with wife Olegario Messier on home regarding d/c plans, quarantine recommendations, etc.      Pertinent Vitals/Pain Pain Assessment: No/denies pain    Home Living  Prior Function            PT Goals (current goals can now be found in the care plan section) Progress towards PT goals: Progressing toward goals     Frequency    Min 3X/week      PT Plan Current plan remains appropriate    Co-evaluation              AM-PAC PT "6 Clicks" Mobility   Outcome Measure  Help needed turning from your back to your side while in a flat bed without using bedrails?: None Help needed moving from lying on your back to sitting on the side of a flat bed without using bedrails?: None Help needed moving to and from a bed to a chair (including a wheelchair)?: A Little Help needed standing up from a chair using your arms (e.g., wheelchair or bedside chair)?: A Little Help needed to walk in hospital room?: A Little Help needed climbing 3-5 steps with a railing? : A Little 6 Click Score: 20    End of Session   Activity Tolerance: Patient tolerated treatment well Patient left: in bed;with call bell/phone within reach;with bed alarm set Nurse Communication: Mobility status PT Visit Diagnosis: Difficulty in walking, not elsewhere classified (R26.2);Unsteadiness on feet (R26.81)     Time: 3888-2800 PT Time Calculation (min) (ACUTE ONLY): 27 min  Charges:  $Gait Training: 8-22 mins $Therapeutic Activity: 8-22 mins                     Mabeline Caras, PT, DPT Acute Rehabilitation Services  Pager [phone] Office [place] 04/04/2019, 4:39 PM

## 2019-04-04 NOTE — Progress Notes (Signed)
Physical Therapy Treatment Patient Details Name: Jonathon Stevens MRN: [id number] DOB: 1946/01/22 Today's Date: 04/04/2019    History of Present Illness 74 y.o. male with medical history significant of PAF on xarelto, prior stroke, prediabetes and admitted for afib with RVR, COVID (+). Pt with acute metabolic encephalopathy; head CT negative for acute abnormality, mild cerebral atrophy and microvascular disease changes.    PT Comments    The patient was noted to be way down at foot of bed with bed alarm going off. Assisted to stand and reposition and return to bed. Patient hallucinating 'There is a Big nose and a dog on the wall". Continue PT   Follow Up Recommendations  Home health PT;Supervision for mobility/OOB     Equipment Recommendations  Rolling walker with 5" wheels    Recommendations for Other Services       Precautions / Restrictions Precautions Precautions: Fall Precaution Comments: monitor HR and sats Restrictions Weight Bearing Restrictions: No    Mobility  Bed Mobility Overal bed mobility: Modified Independent Bed Mobility: Supine to Sit;Sit to Supine     Supine to sit: Min guard Sit to supine: Min guard   General bed mobility comments: min/guard for safety, lines  Transfers Overall transfer level: Needs assistance Equipment used: 1 person hand held assist Transfers: Sit to/from Stand Sit to Stand: Min guard         General transfer comment: cues for safety, and redirection to  return to sitting, side steps al0ng the bed first.    Stairs             Wheelchair Mobility    Modified Rankin (Stroke Patients Only)       Balance Overall balance assessment: Needs assistance;History of Falls   Sitting balance-Leahy Scale: Good       Standing balance-Leahy Scale: Fair                              Cognition Arousal/Alertness: Awake/alert Behavior During Therapy: Restless Overall Cognitive Status: Impaired/Different  from baseline Area of Impairment: Orientation;Attention;Following commands;Safety/judgement;Awareness;Problem solving                 Orientation Level: Disoriented to;Place;Situation Current Attention Level: Selective Memory: Decreased recall of precautions Following Commands: Follows one step commands consistently;Follows multi-step commands inconsistently Safety/Judgement: Decreased awareness of safety;Decreased awareness of deficits Awareness: Intellectual Problem Solving: Difficulty sequencing;Requires verbal cues General Comments: patient found near foot of bed attempting to get up, while standing patient looked towards TV and stated" Thereis a big nose coming out of the wall" then " There is a big dog up there".patient redirected and di not comment further      Exercises      General Comments General comments (skin integrity, edema, etc.): Spoke with wife Olegario Messier on home regarding d/c plans, quarantine recommendations, etc.      Pertinent Vitals/Pain Pain Assessment: No/denies pain    Home Living                      Prior Function            PT Goals (current goals can now be found in the care plan section) Progress towards PT goals: Progressing toward goals    Frequency    Min 3X/week      PT Plan Current plan remains appropriate    Co-evaluation  AM-PAC PT "6 Clicks" Mobility   Outcome Measure  Help needed turning from your back to your side while in a flat bed without using bedrails?: A Little Help needed moving from lying on your back to sitting on the side of a flat bed without using bedrails?: A Little Help needed moving to and from a bed to a chair (including a wheelchair)?: A Little Help needed standing up from a chair using your arms (e.g., wheelchair or bedside chair)?: A Little Help needed to walk in hospital room?: A Little Help needed climbing 3-5 steps with a railing? : A Little 6 Click Score: 18    End of  Session Equipment Utilized During Treatment: Oxygen Activity Tolerance: Patient tolerated treatment well Patient left: in bed;with call bell/phone within reach;with bed alarm set Nurse Communication: Mobility status PT Visit Diagnosis: Difficulty in walking, not elsewhere classified (R26.2);Unsteadiness on feet (R26.81)     Time: 6599-3570 PT Time Calculation (min) (ACUTE ONLY): 15 min  Charges:  $ $Therapeutic Activity: 8-22 mins                     Tresa Endo PT Acute Rehabilitation Services Pager [phone] Office [phone]    Claretha Cooper 04/04/2019, 5:12 PM

## 2019-04-04 NOTE — Progress Notes (Signed)
PROGRESS NOTE  Jonathon Stevens WCB:[id number] DOB: [birth year] DOA: 03/30/2019  PCP: Delorse Lek, MD  Brief History/Interval Summary: 74 y.o.malewith medical history significant ofPAF on xarelto, prior stroke, prediabetes. Patient presented to ED with c/o generalized weakness, malaise, mild diarrhea. No N/V, no SOB nor cough. No known COVID exposures.  Reason for Visit: Atrial fibrillation with RVR.  Pneumonia due to COVID-19.  Acute respiratory failure with hypoxia.  Consultants: None  Procedures: None  Antibiotics: Anti-infectives (From admission, onward)   Start     Dose/Rate Route Frequency Ordered Stop   04/02/19 1745  cefTRIAXone (ROCEPHIN) 1 g in sodium chloride 0.9 % 100 mL IVPB     1 g 200 mL/hr over 30 Minutes Intravenous Every 24 hours 04/02/19 1736     04/01/19 1000  remdesivir 100 mg in sodium chloride 0.9 % 100 mL IVPB     100 mg 200 mL/hr over 30 Minutes Intravenous Daily 03/30/19 2352 04/04/19 1102   03/31/19 0030  remdesivir 200 mg in sodium chloride 0.9% 250 mL IVPB     200 mg 580 mL/hr over 30 Minutes Intravenous Once 03/30/19 2352 03/31/19 0241      Subjective/Interval History: Patient states that he wants to sit up in the chair and start walking.  States that his shortness of breath has improved but he still has a cough which is dry.  Denies any chest pain.  No dizziness or lightheadedness.    Assessment/Plan:  Acute Hypoxic Resp. Failure/Pneumonia due to COVID-19  Recent Labs  Lab 03/30/19 2146 03/31/19 0248 03/31/19 0500 04/01/19 0500 04/02/19 0224 04/03/19 0202 04/04/19 0010  DDIMER  --   --  2.16* 2.93* 12.34* 2.99* 2.18*  FERRITIN 891*  --   --   --   --   --   --   CRP 2.0*  --  3.4* 5.9* 6.4* 5.3* 3.1*  ALT 30  --  29 42 57* 53* 56*  PROCALCITON <0.10 0.16  --   --  1.48 0.80 0.44    Objective findings: Fever: Afebrile Oxygen requirements: HFNC, 6 L/min.  Saturating in the early 90s.  COVID 19  Therapeutics: Antibacterials: On ceftriaxone Remdesivir: Completed remdesivir today Steroids: Dexamethasone 6 mg daily Diuretics: Not on scheduled diuretics Actemra: Given Actemra on 1/6 Convalescent Plasma: Not given Vitamin C and Zinc: Continue PUD Prophylaxis: Initiate Pepcid DVT Prophylaxis: On rivaroxaban  From a respiratory standpoint patient is stable though still requiring a lot of oxygen.  He has completed course of remdesivir.  He remains on steroids.  He also remains on antibacterials.  Procalcitonin was 1.482 days ago and improved to 0.44 today.  D-dimer is also improving.  CRP is improved to 3.1.  Continue incentive spirometry, mobilization, out of bed to chair.  Prone positioning as tolerated.  Consider dose of furosemide today.  Insignificant growth of staph epidermidis noted in urine culture.  Atrial fibrillation with RVR He required Cardizem infusion.  Has been weaned off of the infusion.  On metoprolol which he takes at home.  Heart rate between 95 to 110.  Increase dose of beta-blocker.  Continue rivaroxaban.  Acute metabolic encephalopathy Most likely due to acute illness.  Now resolved.  No focal deficits.  CT head done a few days ago did not show any acute findings.    Prediabetes HbA1c 6.6.  Elevated CBGs most likely due to steroids.  Continue SSI.  Continue to monitor.  Gout Continue allopurinol.  History of stroke On anticoagulation for stroke prevention.  PT and OT evaluation.   DVT Prophylaxis: Rivaroxaban Code Status: Full code Family Communication: We will discuss with his wife Disposition Plan: Management as outlined above.  Start mobilizing.  Hopefully will be able to return home when improved.   Medications:  Scheduled: . allopurinol  300 mg Oral Daily  . vitamin C  500 mg Oral Daily  . atorvastatin  10 mg Oral q1800  . Chlorhexidine Gluconate Cloth  6 each Topical Q0600  . dexamethasone  6 mg Oral Daily  . insulin aspart  0-15 Units  Subcutaneous TID WC  . insulin aspart  0-5 Units Subcutaneous QHS  . mouth rinse  15 mL Mouth Rinse BID  . metoprolol tartrate  50 mg Oral BID  . multivitamin with minerals  1 tablet Oral Daily  . rivaroxaban  20 mg Oral Daily  . zinc sulfate  220 mg Oral Daily   Continuous: . cefTRIAXone (ROCEPHIN)  IV 1 g (04/03/19 1751)  . diltiazem (CARDIZEM) infusion 5 mg/hr (04/03/19 0826)   KXF:[id number], albuterol, chlorpheniramine-HYDROcodone, guaiFENesin-dextromethorphan, loperamide, ondansetron **OR** ondansetron (ZOFRAN) IV   Objective:  Vital Signs  Vitals:   04/03/19 2300 04/04/19 0000 04/04/19 0400 04/04/19 0744  BP:  128/66 128/68 126/76  Pulse: 82 88  (!) 101  Resp: (!) 21 19    Temp:  98.2 F (36.8 C) 98.4 F (36.9 C)   TempSrc:  Oral  Oral  SpO2: 91% 94%  92%  Weight:      Height:        Intake/Output Summary (Last 24 hours) at 04/04/2019 1116 Last data filed at 04/04/2019 0000 Gross per 24 hour  Intake 250 ml  Output 600 ml  Net -350 ml   Filed Weights   03/30/19 2101 04/01/19 2135  Weight: 83.9 kg 84.6 kg    General appearance: Awake alert.  In no distress Resp: Tachypneic at rest.  No use of accessory muscles.  Crackles at the bases bilaterally.  No wheezing or rhonchi.   Cardio: S1-S2 is irregularly irregular .  No S3-S4.  No rubs murmurs or bruit GI: Abdomen is soft.  Nontender nondistended.  Bowel sounds are present normal.  No masses organomegaly Extremities: No edema.  Full range of motion of lower extremities. Neurologic: Alert and oriented x3.  No focal neurological deficits.    Lab Results:  Data Reviewed: I have personally reviewed following labs and imaging studies  CBC: Recent Labs  Lab 03/31/19 0500 04/01/19 0500 04/02/19 0224 04/03/19 0202 04/04/19 0010  WBC 6.0 4.4 5.8 2.6* 6.6  NEUTROABS 4.9 3.1 4.6 1.9 5.4  HGB 15.5 15.5 15.8 15.0 15.5  HCT 46.3 45.4 45.9 43.7 43.9  MCV 91.7 89.5 88.3 87.6 87.6  PLT 122* 95* 95* 97* 127*     Basic Metabolic Panel: Recent Labs  Lab 03/31/19 0500 04/01/19 0500 04/02/19 0224 04/03/19 0202 04/04/19 0010  NA 130* 128* 125* 129* 134*  K 3.9 3.4* 3.5 4.2 4.2  CL 97* 96* 93* 97* 99  CO2 22 19* 21* 20* 23  GLUCOSE 181* 150* 191* 195* 160*  BUN 21 22 25* 25* 29*  CREATININE 1.44* 1.21 1.32* 1.14 1.05  CALCIUM 8.4* 8.3* 8.2* 8.1* 8.7*    GFR: Estimated Creatinine Clearance: 66.7 mL/min (by C-G formula based on SCr of 1.05 mg/dL).  Liver Function Tests: Recent Labs  Lab 03/31/19 0500 04/01/19 0500 04/02/19 0224 04/03/19 0202 04/04/19 0010  AST 36 85* 115* 86* 65*  ALT 29 42 57* 53* 56*  ALKPHOS  69 59 56 53 60  BILITOT 1.0 0.9 0.7 1.2 0.8  PROT 7.3 6.8 6.4* 6.2* 6.4*  ALBUMIN 4.1 4.0 3.4* 3.2* 3.3*    Coagulation Profile: Recent Labs  Lab 03/30/19 2146  INR 1.3*    CBG: Recent Labs  Lab 04/03/19 0803 04/03/19 1304 04/03/19 1740 04/03/19 2113 04/04/19 0724  GLUCAP 200* 267* 201* 234* 224*     Recent Results (from the past 240 hour(s))  Blood Culture (routine x 2)     Status: None (Preliminary result)   Collection Time: 03/30/19  9:18 PM   Specimen: BLOOD LEFT FOREARM  Result Value Ref Range Status   Specimen Description   Final    BLOOD LEFT FOREARM Performed at [facility], [address] [place], [place] [address]    Special Requests   Final    BOTTLES DRAWN AEROBIC AND ANAEROBIC Blood Culture adequate volume Performed at [facility], [address] [place], [place] [address]    Culture   Final    NO GROWTH 4 DAYS Performed at [facility], [address], [place], [place] [address]    Report Status PENDING  Incomplete  Blood Culture (routine x 2)     Status: None (Preliminary result)   Collection Time: 03/30/19  9:46 PM   Specimen: BLOOD  Result Value Ref Range Status   Specimen Description   Final    BLOOD RIGHT ANTECUBITAL Performed at [facility], [address],  [place], [place] [address]    Special Requests   Final    BOTTLES DRAWN AEROBIC AND ANAEROBIC Blood Culture adequate volume Performed at [facility], [address] [place], [place] [address]    Culture   Final    NO GROWTH 4 DAYS Performed at [facility], [address], [place], [place] [address]    Report Status PENDING  Incomplete  Urine culture     Status: Abnormal   Collection Time: 03/31/19  5:30 AM   Specimen: In/Out Cath Urine  Result Value Ref Range Status   Specimen Description   Final    IN/OUT CATH URINE Performed at [facility], [address], [place], [place] [address]    Special Requests   Final    NONE Performed at [facility], [address], [place], [place] [address]    Culture 300 COLONIES/mL STAPHYLOCOCCUS EPIDERMIDIS (A)  Final   Report Status 04/03/2019 FINAL  Final   Organism ID, Bacteria STAPHYLOCOCCUS EPIDERMIDIS (A)  Final      Susceptibility   Staphylococcus epidermidis - MIC*    CIPROFLOXACIN <=0.5 SENSITIVE Sensitive     GENTAMICIN <=0.5 SENSITIVE Sensitive     NITROFURANTOIN <=16 SENSITIVE Sensitive     OXACILLIN <=0.25 SENSITIVE Sensitive     TETRACYCLINE <=1 SENSITIVE Sensitive     VANCOMYCIN 1 SENSITIVE Sensitive     TRIMETH/SULFA <=10 SENSITIVE Sensitive     CLINDAMYCIN <=0.25 SENSITIVE Sensitive     RIFAMPIN <=0.5 SENSITIVE Sensitive     Inducible Clindamycin NEGATIVE Sensitive     * 300 COLONIES/mL STAPHYLOCOCCUS EPIDERMIDIS  MRSA PCR Screening     Status: None   Collection Time: 04/01/19  9:30 PM   Specimen: Nasopharyngeal  Result Value Ref Range Status   MRSA by PCR NEGATIVE NEGATIVE Final    Comment:        The GeneXpert MRSA Assay (FDA approved for NASAL specimens only), is one component  of a comprehensive MRSA colonization surveillance program. It is not intended to diagnose MRSA infection nor to guide or monitor treatment for MRSA infections. Performed at  [facility], [place] [address], [place], [place] [address]       Radiology Studies: DG Chest 1 View  Result Date: 04/02/2019 CLINICAL DATA:  COVID positive, former smoker EXAM: CHEST  1 VIEW COMPARISON:  Radiograph 03/30/2019 FINDINGS: Normal mediastinum and cardiac silhouette. There is peripheral fine airspace opacities LEFT and RIGHT lung which are increased slightly from comparison exam. No pleural fluid. No pneumothorax. No acute osseous abnormality. IMPRESSION: Increasing fine peripheral airspace disease. Findings are consistent with mild worsening of COVID pneumonia. Electronically Signed   By: Suzy Bouchard M.D.   On: 04/02/2019 14:12       LOS: 4 days   [facility] Pager on [email or web address]  04/04/2019, 11:16 AM

## 2019-04-05 DIAGNOSIS — R443 Hallucinations, unspecified: Secondary | ICD-10-CM

## 2019-04-05 LAB — CULTURE, BLOOD (ROUTINE X 2)
Culture: NO GROWTH
Culture: NO GROWTH
Special Requests: ADEQUATE
Special Requests: ADEQUATE

## 2019-04-05 LAB — COMPREHENSIVE METABOLIC PANEL
ALT: 66 U/L — ABNORMAL HIGH (ref 0–44)
AST: 67 U/L — ABNORMAL HIGH (ref 15–41)
Albumin: 3.2 g/dL — ABNORMAL LOW (ref 3.5–5.0)
Alkaline Phosphatase: 59 U/L (ref 38–126)
Anion gap: 11 (ref 5–15)
BUN: 33 mg/dL — ABNORMAL HIGH (ref 8–23)
CO2: 24 mmol/L (ref 22–32)
Calcium: 8.3 mg/dL — ABNORMAL LOW (ref 8.9–10.3)
Chloride: 98 mmol/L (ref 98–111)
Creatinine, Ser: 1.16 mg/dL (ref 0.61–1.24)
GFR calc Af Amer: >60 mL/min (ref >60)
GFR calc non Af Amer: >60 mL/min (ref >60)
Glucose, Bld: 197 mg/dL — ABNORMAL HIGH (ref 70–99)
Potassium: 3.7 mmol/L (ref 3.5–5.1)
Sodium: 133 mmol/L — ABNORMAL LOW (ref 135–145)
Total Bilirubin: 1 mg/dL (ref 0.3–1.2)
Total Protein: 5.9 g/dL — ABNORMAL LOW (ref 6.5–8.1)

## 2019-04-05 LAB — CBC WITH DIFFERENTIAL/PLATELET
Abs Immature Granulocytes: 0.06 10*3/uL (ref 0.00–0.07)
Basophils Absolute: 0 10*3/uL (ref 0.0–0.1)
Basophils Relative: 0 %
Eosinophils Absolute: 0 10*3/uL (ref 0.0–0.5)
Eosinophils Relative: 0 %
HCT: 42.3 % (ref 39.0–52.0)
Hemoglobin: 14.8 g/dL (ref 13.0–17.0)
Immature Granulocytes: 1 %
Lymphocytes Relative: 7 %
Lymphs Abs: 0.8 10*3/uL (ref 0.7–4.0)
MCH: 30.1 pg (ref 26.0–34.0)
MCHC: 35 g/dL (ref 30.0–36.0)
MCV: 86.2 fL (ref 80.0–100.0)
Monocytes Absolute: 0.5 10*3/uL (ref 0.1–1.0)
Monocytes Relative: 4 %
Neutro Abs: 10.3 10*3/uL — ABNORMAL HIGH (ref 1.7–7.7)
Neutrophils Relative %: 88 %
Platelets: 170 10*3/uL (ref 150–400)
RBC: 4.91 MIL/uL (ref 4.22–5.81)
RDW: 13 % (ref 11.5–15.5)
WBC: 11.6 10*3/uL — ABNORMAL HIGH (ref 4.0–10.5)
nRBC: 0.2 % (ref 0.0–0.2)

## 2019-04-05 LAB — GLUCOSE, CAPILLARY
Glucose-Capillary: 200 mg/dL — ABNORMAL HIGH (ref 70–99)
Glucose-Capillary: 214 mg/dL — ABNORMAL HIGH (ref 70–99)
Glucose-Capillary: 219 mg/dL — ABNORMAL HIGH (ref 70–99)
Glucose-Capillary: 248 mg/dL — ABNORMAL HIGH (ref 70–99)

## 2019-04-05 LAB — PROCALCITONIN: Procalcitonin: 0.18 ng/mL

## 2019-04-05 LAB — D-DIMER, QUANTITATIVE: D-Dimer, Quant: 1.63 ug/mL-FEU — ABNORMAL HIGH (ref 0.00–0.50)

## 2019-04-05 LAB — C-REACTIVE PROTEIN: CRP: 1.6 mg/dL — ABNORMAL HIGH (ref <1.0)

## 2019-04-05 MED ORDER — HALOPERIDOL LACTATE 5 MG/ML IJ SOLN: 2.0000 mg | Freq: Four times a day (QID) | INTRAMUSCULAR | Status: DC | PRN

## 2019-04-05 MED ORDER — INSULIN GLARGINE 100 UNIT/ML ~~LOC~~ SOLN
8.0000 [IU] | Freq: Every day | SUBCUTANEOUS | Status: DC
  Administered 2019-04-05 – 2019-04-10 (×6): 8 [IU] via SUBCUTANEOUS
  Filled 2019-04-05 (×6): qty 0.08

## 2019-04-05 MED ORDER — FUROSEMIDE 10 MG/ML IJ SOLN
40.0000 mg | Freq: Once | INTRAMUSCULAR | Status: AC
  Administered 2019-04-05: 40 mg via INTRAVENOUS
  Filled 2019-04-05: qty 4

## 2019-04-05 NOTE — Plan of Care (Signed)
Pt was Aox1-2. Tried to get out of bed without assistance a few times today. Bed alarm and Posey alarm were on each time. Pt reoriented to place, time, and situation multiple times throughout the shift. Pt has hallucinations and complains of seeing many different things in the room and re-oriented as much as possible. Wife was updated and informed of delirium. All questions were answered. Remained on 6L HFNC today. No complaints of pain.   Problem: Health Behavior/Discharge Planning: Goal: Ability to manage health-related needs will improve Outcome: Progressing   Problem: Elimination: Goal: Will not experience complications related to bowel motility Outcome: Progressing Goal: Will not experience complications related to urinary retention Outcome: Progressing   Problem: Safety: Goal: Ability to remain free from injury will improve Outcome: Progressing   Problem: Education: Goal: Knowledge of General Education information will improve Description: Including pain rating scale, medication(s)/side effects and non-pharmacologic comfort measures Outcome: Progressing   Problem: Health Behavior/Discharge Planning: Goal: Ability to manage health-related needs will improve Outcome: Progressing   Problem: Clinical Measurements: Goal: Ability to maintain clinical measurements within normal limits will improve Outcome: Progressing Goal: Will remain free from infection Outcome: Progressing Goal: Diagnostic test results will improve Outcome: Progressing Goal: Respiratory complications will improve Outcome: Progressing Goal: Cardiovascular complication will be avoided Outcome: Progressing   Problem: Nutrition: Goal: Adequate nutrition will be maintained Outcome: Progressing   Problem: Activity: Goal: Risk for activity intolerance will decrease Outcome: Progressing   Problem: Coping: Goal: Level of anxiety will decrease Outcome: Progressing   Problem: Elimination: Goal: Will not  experience complications related to bowel motility Outcome: Progressing Goal: Will not experience complications related to urinary retention Outcome: Progressing   Problem: Pain Managment: Goal: General experience of comfort will improve Outcome: Progressing   Problem: Safety: Goal: Ability to remain free from injury will improve Outcome: Progressing   Problem: Skin Integrity: Goal: Risk for impaired skin integrity will decrease Outcome: Progressing

## 2019-04-05 NOTE — Progress Notes (Signed)
1900 Report from [facility]

## 2019-04-05 NOTE — Progress Notes (Signed)
PROGRESS NOTE  Jonathon Stevens WCH:[id number] DOB: 26-Mar-1946 DOA: 03/30/2019  PCP: Delorse Lek, MD  Brief History/Interval Summary: 74 y.o.malewith medical history significant ofPAF on xarelto, prior stroke, prediabetes. Patient presented to ED with c/o generalized weakness, malaise, mild diarrhea. No N/V, no SOB nor cough. No known COVID exposures.  Reason for Visit: Atrial fibrillation with RVR.  Pneumonia due to COVID-19.  Acute respiratory failure with hypoxia.  Consultants: None  Procedures: None  Antibiotics: Anti-infectives (From admission, onward)   Start     Dose/Rate Route Frequency Ordered Stop   04/02/19 1745  cefTRIAXone (ROCEPHIN) 1 g in sodium chloride 0.9 % 100 mL IVPB     1 g 200 mL/hr over 30 Minutes Intravenous Every 24 hours 04/02/19 1736 04/07/19 1744   04/01/19 1000  remdesivir 100 mg in sodium chloride 0.9 % 100 mL IVPB     100 mg 200 mL/hr over 30 Minutes Intravenous Daily 03/30/19 2352 04/04/19 1102   03/31/19 0030  remdesivir 200 mg in sodium chloride 0.9% 250 mL IVPB     200 mg 580 mL/hr over 30 Minutes Intravenous Once 03/30/19 2352 03/31/19 0241      Subjective/Interval History: Patient apparently had some hallucinations yesterday.  He still thinks that he is seeing rabbits on the wall.  Patient was reassured.  He was reoriented.  He denies any headaches or visual disturbances.  Shortness of breath is about the same.  Dry cough present.    Assessment/Plan:  Acute Hypoxic Resp. Failure/Pneumonia due to COVID-19  Recent Labs  Lab 03/30/19 2146 03/30/19 2146 03/31/19 0248 04/01/19 0500 04/02/19 0224 04/03/19 0202 04/04/19 0010 04/05/19 0454  DDIMER  --    < >  --  2.93* 12.34* 2.99* 2.18* 1.63*  FERRITIN 891*  --   --   --   --   --   --   --   CRP 2.0*   < >  --  5.9* 6.4* 5.3* 3.1* 1.6*  ALT 30   < >  --  42 57* 53* 56* 66*  PROCALCITON <0.10  --  0.16  --  1.48 0.80 0.44 0.18   < > = values in this interval not displayed.    Objective findings: Fever: Remains afebrile Oxygen requirements: HFNC 6 to 8 L.  Saturating in the early 90s.    COVID 19 Therapeutics: Antibacterials: On ceftriaxone Remdesivir: Completed remdesivir on 1/8 Steroids: Dexamethasone 6 mg daily Diuretics: Not on scheduled diuretics Actemra: Given Actemra on 1/6 Convalescent Plasma: Not given Vitamin C and Zinc: Continue PUD Prophylaxis: Initiate Pepcid DVT Prophylaxis: On rivaroxaban  Patient still requiring a lot of oxygen.  He has completed course of remdesivir.  He remains on steroids.  He is also on antibacterials.  Procalcitonin improved to 0.18 from 1.48.  CRP is down to 1.6.  D-dimer improved to 1.63.  Continue to monitor.  Continue with incentive spirometry mobilization and out of bed to chair.  He diuresed quite a bit after he was given Lasix.  Will give additional dose today. Insignificant growth of staph epidermidis noted in urine culture.  Atrial fibrillation with RVR Initially required Cardizem infusion.  Currently on beta-blocker.  Dose was increased yesterday.  Heart rate seems to be better controlled.  Continue to monitor on telemetry.  Continue rivaroxaban.    Acute metabolic encephalopathy/hallucinations Acute encephalopathy most likely due to acute illness and steroids.  He is experiencing some hallucination.  CT head done a few days ago did not show  any acute findings.  Continue to reorient.  Should improve as steroid is tapered down.  Could give him Haldol if symptoms persist.  No focal deficits on examination.    Transaminitis Due to COVID-19.  Will need follow-up in the outpatient setting.  Hyponatremia Monitor.  Prediabetes HbA1c 6.6.  Elevated CBGs most likely due to steroids.  Continue SSI.  Consider low-dose Lantus.  Gout Continue allopurinol.  History of stroke On anticoagulation for stroke prevention.  PT and OT evaluation.  Home health recommended with supervision.   DVT Prophylaxis: Rivaroxaban  Code Status: Full code Family Communication: Wife was updated yesterday.  Will do so again today. Disposition Plan: Continue to mobilize.  Hopefully will be able to return home when improved.   Medications:  Scheduled: . allopurinol  300 mg Oral Daily  . vitamin C  500 mg Oral Daily  . atorvastatin  10 mg Oral q1800  . Chlorhexidine Gluconate Cloth  6 each Topical Q0600  . dexamethasone  6 mg Oral Daily  . famotidine  20 mg Oral Daily  . insulin aspart  0-15 Units Subcutaneous TID WC  . insulin aspart  0-5 Units Subcutaneous QHS  . mouth rinse  15 mL Mouth Rinse BID  . metoprolol tartrate  50 mg Oral BID  . multivitamin with minerals  1 tablet Oral Daily  . rivaroxaban  20 mg Oral Daily  . zinc sulfate  220 mg Oral Daily   Continuous: . cefTRIAXone (ROCEPHIN)  IV 1 g (04/04/19 1645)   AJG:OTLXBWIOMBTDH, chlorpheniramine-HYDROcodone, guaiFENesin-dextromethorphan, loperamide, ondansetron **OR** ondansetron (ZOFRAN) IV   Objective:  Vital Signs  Vitals:   04/05/19 0000 04/05/19 0335 04/05/19 0400 04/05/19 0800  BP: 119/81 (!) 144/74 123/90 127/88  Pulse:  97 96   Resp: 17 18 17    Temp: 97.9 F (36.6 C) 98 F (36.7 C)  97.8 F (36.6 C)  TempSrc: Oral Oral  Oral  SpO2:  (!) 89% (!) 88%   Weight:      Height:        Intake/Output Summary (Last 24 hours) at 04/05/2019 1133 Last data filed at 04/05/2019 0800 Gross per 24 hour  Intake 367.29 ml  Output 4500 ml  Net -4132.71 ml   Filed Weights   03/30/19 2101 04/01/19 2135  Weight: 83.9 kg 84.6 kg    General appearance: Awake alert.  In no distress Resp: Mildly tachypneic at rest.  Coarse breath sounds bilaterally with crackles at the bases.  No wheezing or rhonchi.   Cardio: S1-S2 is irregularly irregular.  No S3-S4.  No rubs murmurs or bruit GI: Abdomen is soft.  Nontender nondistended.  Bowel sounds are present normal.  No masses organomegaly Extremities: No edema.  Full range of motion of lower extremities.  Neurologic: Mildly distracted.  However he is alert.  He was able to answer orientation questions.  No focal deficits.   Lab Results:  Data Reviewed: I have personally reviewed following labs and imaging studies  CBC: Recent Labs  Lab 04/01/19 0500 04/02/19 0224 04/03/19 0202 04/04/19 0010 04/05/19 0454  WBC 4.4 5.8 2.6* 6.6 11.6*  NEUTROABS 3.1 4.6 1.9 5.4 10.3*  HGB 15.5 15.8 15.0 15.5 14.8  HCT 45.4 45.9 43.7 43.9 42.3  MCV 89.5 88.3 87.6 87.6 86.2  PLT 95* 95* 97* 127* 170    Basic Metabolic Panel: Recent Labs  Lab 04/01/19 0500 04/02/19 0224 04/03/19 0202 04/04/19 0010 04/05/19 0454  NA 128* 125* 129* 134* 133*  K 3.4* 3.5 4.2 4.2  3.7  CL 96* 93* 97* 99 98  CO2 19* 21* 20* 23 24  GLUCOSE 150* 191* 195* 160* 197*  BUN 22 25* 25* 29* 33*  CREATININE 1.21 1.32* 1.14 1.05 1.16  CALCIUM 8.3* 8.2* 8.1* 8.7* 8.3*    GFR: Estimated Creatinine Clearance: 60.4 mL/min (by C-G formula based on SCr of 1.16 mg/dL).  Liver Function Tests: Recent Labs  Lab 04/01/19 0500 04/02/19 0224 04/03/19 0202 04/04/19 0010 04/05/19 0454  AST 85* 115* 86* 65* 67*  ALT 42 57* 53* 56* 66*  ALKPHOS 59 56 53 60 59  BILITOT 0.9 0.7 1.2 0.8 1.0  PROT 6.8 6.4* 6.2* 6.4* 5.9*  ALBUMIN 4.0 3.4* 3.2* 3.3* 3.2*    Coagulation Profile: Recent Labs  Lab 03/30/19 2146  INR 1.3*    CBG: Recent Labs  Lab 04/04/19 0724 04/04/19 1150 04/04/19 1623 04/04/19 2030 04/05/19 0815  GLUCAP 224* 162* 237* 252* 200*     Recent Results (from the past 240 hour(s))  Blood Culture (routine x 2)     Status: None   Collection Time: 03/30/19  9:18 PM   Specimen: BLOOD LEFT FOREARM  Result Value Ref Range Status   Specimen Description   Final    BLOOD LEFT FOREARM Performed at [facility], [address] [place], [place] [address]    Special Requests   Final    BOTTLES DRAWN AEROBIC AND ANAEROBIC Blood Culture adequate volume Performed at [facility], [address] [place], [place] [address]    Culture   Final    NO GROWTH 5 DAYS Performed at [facility], [address] [place], [place] [address]    Report Status 04/05/2019 FINAL  Final  Blood Culture (routine x 2)     Status: None   Collection Time: 03/30/19  9:46 PM   Specimen: BLOOD  Result Value Ref Range Status   Specimen Description   Final    BLOOD RIGHT ANTECUBITAL Performed at [facility], [address] [place], [place] [address]    Special Requests   Final    BOTTLES DRAWN AEROBIC AND ANAEROBIC Blood Culture adequate volume Performed at [facility], [address], [place], [place] [address]    Culture   Final    NO GROWTH 5 DAYS Performed at [facility], [address] [place], [place] [address]    Report Status 04/05/2019 FINAL  Final  Urine culture     Status: Abnormal   Collection Time: 03/31/19  5:30 AM   Specimen: In/Out Cath Urine  Result Value Ref Range Status   Specimen Description   Final    IN/OUT CATH URINE Performed at [facility], [address] [place], [place] [address]    Special Requests   Final    NONE Performed at [facility], [address] [place], [place] [address]    Culture 300 COLONIES/mL STAPHYLOCOCCUS EPIDERMIDIS (A)  Final   Report Status 04/03/2019 FINAL  Final   Organism ID, Bacteria STAPHYLOCOCCUS EPIDERMIDIS (A)  Final      Susceptibility   Staphylococcus epidermidis - MIC*    CIPROFLOXACIN <=0.5 SENSITIVE Sensitive     GENTAMICIN <=0.5 SENSITIVE Sensitive     NITROFURANTOIN <=16 SENSITIVE Sensitive     OXACILLIN <=0.25 SENSITIVE Sensitive     TETRACYCLINE <=1 SENSITIVE Sensitive     VANCOMYCIN 1 SENSITIVE Sensitive     TRIMETH/SULFA <=10 SENSITIVE Sensitive  CLINDAMYCIN <=0.25 SENSITIVE Sensitive     RIFAMPIN <=0.5 SENSITIVE Sensitive     Inducible Clindamycin NEGATIVE Sensitive     * 300 COLONIES/mL STAPHYLOCOCCUS  EPIDERMIDIS  MRSA PCR Screening     Status: None   Collection Time: 04/01/19  9:30 PM   Specimen: Nasopharyngeal  Result Value Ref Range Status   MRSA by PCR NEGATIVE NEGATIVE Final    Comment:        The GeneXpert MRSA Assay (FDA approved for NASAL specimens only), is one component of a comprehensive MRSA colonization surveillance program. It is not intended to diagnose MRSA infection nor to guide or monitor treatment for MRSA infections. Performed at [facility], [place] [address], [place], [place] [address]       Radiology Studies: No results found.     LOS: 5 days   Payam Grib[employer] on [email or web address]  04/05/2019, 11:33 AM

## 2019-04-05 NOTE — Evaluation (Signed)
Occupational Therapy Evaluation Patient Details Name: Jonathon Stevens MRN: [id number] DOB: [birth year] Today's Date: 04/05/2019    History of Present Illness 74 y.o. male with medical history significant of PAF on xarelto, prior stroke, prediabetes and admitted for afib with RVR, COVID (+). Pt with acute metabolic encephalopathy; head CT negative for acute abnormality, mild cerebral atrophy and microvascular disease changes.   Clinical Impression   PTA pt living with spouse, independent with mobility and BADL. Unsure of exact PLOF given pt compromised cognitive status. At time of eval, he is min A for bed mobility and min guard for transfers. Pt on 8L HFNC and on portable tank to use bathroom during session with SpO2 87-93%. Pt completed functional mobility around room beyond household distance at min guard level with RW. He then completed toilet t/f and own hygiene after BM. Constant safety cues needed throughout session, pt trying to remove lines and needing redirection. Pt also presents with decreased awareness of deficits and overall confused (was looking through phone camera on arrival calling it his project). At end of session, asked OT to speak with wife on functional progress. Wife pleasant and supportive, educated on progress and post acute needs. Given current status, recommend HHOT at d/c for safe progression of BADLs in home environment. Will continue to follow per POC listed below.     Follow Up Recommendations  Home health OT;Supervision/Assistance - 24 hour    Equipment Recommendations  None recommended by OT    Recommendations for Other Services       Precautions / Restrictions Precautions Precautions: Fall Precaution Comments: monitor HR and sats Restrictions Weight Bearing Restrictions: No      Mobility Bed Mobility   Bed Mobility: Supine to Sit     Supine to sit: Min assist     General bed mobility comments: min A to pull up at trunk  Transfers Overall  transfer level: Needs assistance Equipment used: Rolling walker (2 wheeled) Transfers: Sit to/from Stand Sit to Stand: Min guard         General transfer comment: cues for safety and line management    Balance Overall balance assessment: Needs assistance;History of Falls   Sitting balance-Leahy Scale: Good     Standing balance support: Bilateral upper extremity supported;During functional activity Standing balance-Leahy Scale: Fair                             ADL either performed or assessed with clinical judgement   ADL Overall ADL's : Needs assistance/impaired Eating/Feeding: Set up;Sitting   Grooming: Min guard;Standing   Upper Body Bathing: Set up;Sitting   Lower Body Bathing: Min guard;Sit to/from stand   Upper Body Dressing : Set up;Sitting   Lower Body Dressing: Min guard;Sit to/from stand   Toilet Transfer: Mining engineer Details (indicate cue type and reason): pt completed transfer to bathroom in room. Safety cues needed Toileting- Clothing Manipulation and Hygiene: Modified independent;Sit to/from stand Toileting - Clothing Manipulation Details (indicate cue type and reason): able to complete own peri care after BM Tub/ Shower Transfer: Min guard;Ambulation;Shower seat   Functional mobility during ADLs: Min guard;Cueing for safety;Rolling walker General ADL Comments: pt is limited by cardiopulm compromise, high O2 needs, and cognitive deficits     Vision Baseline Vision/History: Wears glasses Wears Glasses: At all times Patient Visual Report: No change from baseline       Perception     Praxis  Pertinent Vitals/Pain Pain Assessment: Faces Faces Pain Scale: Hurts a little bit Pain Location: mid back Pain Descriptors / Indicators: Sore Pain Intervention(s): Monitored during session     Hand Dominance     Extremity/Trunk Assessment Upper Extremity Assessment Upper Extremity Assessment: Generalized  weakness   Lower Extremity Assessment Lower Extremity Assessment: Defer to PT evaluation       Communication Communication Communication: No difficulties   Cognition Arousal/Alertness: Awake/alert Behavior During Therapy: Impulsive Overall Cognitive Status: Impaired/Different from baseline Area of Impairment: Orientation;Attention;Memory;Following commands;Safety/judgement;Awareness;Problem solving                 Orientation Level: Disoriented to;Place;Situation Current Attention Level: Selective Memory: Decreased recall of precautions(with O2 sats) Following Commands: Follows one step commands consistently;Follows multi-step commands inconsistently Safety/Judgement: Decreased awareness of safety;Decreased awareness of deficits Awareness: Intellectual Problem Solving: Difficulty sequencing;Requires verbal cues General Comments: pt is not oriented to current situation. When OT walked in he was looking at his phone with the camera on (not recording or taking pictures) stating that this was his project he is working on. Once reoriented and cued, pt able to describe some events leading up to current status and recalled falling. He continues to have poor insight into his deficits   General Comments       Exercises     Shoulder Instructions      Home Living Family/patient expects to be discharged to:: Private residence Living Arrangements: Spouse/significant other Available Help at Discharge: Family;Available PRN/intermittently Type of Home: House Home Access: Stairs to enter [employer] of Steps: 3 Entrance Stairs-Rails: None Home Layout: One level               Home Equipment: Cane - single point          Prior Functioning/Environment Level of Independence: Independent        Comments: states he was working at [employer]- unsure of accuracy        OT Problem List: Decreased strength;Decreased knowledge of use of DME or  AE;Decreased activity tolerance;Cardiopulmonary status limiting activity;Impaired balance (sitting and/or standing);Decreased safety awareness      OT Treatment/Interventions: Self-care/ADL training;Therapeutic exercise;Patient/family education;Balance training;Energy conservation;Therapeutic activities;DME and/or AE instruction;Cognitive remediation/compensation    OT Goals(Current goals can be found in the care plan section) Acute Rehab OT Goals Patient Stated Goal: get better OT Goal Formulation: With patient Time For Goal Achievement: 04/19/19 Potential to Achieve Goals: Good  OT Frequency: Min 2X/week   Barriers to D/C:            Co-evaluation              AM-PAC OT "6 Clicks" Daily Activity     Outcome Measure Help from another person eating meals?: None Help from another person taking care of personal grooming?: None Help from another person toileting, which includes using toliet, bedpan, or urinal?: A Little Help from another person bathing (including washing, rinsing, drying)?: A Little Help from another person to put on and taking off regular upper body clothing?: None Help from another person to put on and taking off regular lower body clothing?: A Little 6 Click Score: 21   End of Session Equipment Utilized During Treatment: Gait belt;Rolling walker;Oxygen Nurse Communication: Mobility status  Activity Tolerance: Patient tolerated treatment well Patient left: in bed;with call bell/phone within reach;with bed alarm set  OT Visit Diagnosis: Unsteadiness on feet (R26.81);Other abnormalities of gait and mobility (R26.89);History of falling (Z91.81);Muscle weakness (generalized) (M62.81);Other symptoms and signs involving cognitive  function                Time: 1553-1630 OT Time Calculation (min): 37 min Charges:  OT General Charges $OT Visit: 1 Visit OT Evaluation $OT Eval Moderate Complexity: 1 Mod OT Treatments $Self Care/Home Management : 8-22  mins  Dalphine Handing, MSOT, OTR/L [facility] Office Number: [phone]  Dalphine Handing 04/05/2019, 5:04 PM

## 2019-04-06 ENCOUNTER — Inpatient Hospital Stay (HOSPITAL_COMMUNITY): Payer: Medicare Other

## 2019-04-06 LAB — CBC WITH DIFFERENTIAL/PLATELET
Abs Immature Granulocytes: 0.12 10*3/uL — ABNORMAL HIGH (ref 0.00–0.07)
Basophils Absolute: 0.1 10*3/uL (ref 0.0–0.1)
Basophils Relative: 1 %
Eosinophils Absolute: 0 10*3/uL (ref 0.0–0.5)
Eosinophils Relative: 0 %
HCT: 45.3 % (ref 39.0–52.0)
Hemoglobin: 16 g/dL (ref 13.0–17.0)
Immature Granulocytes: 1 %
Lymphocytes Relative: 7 %
Lymphs Abs: 1.4 10*3/uL (ref 0.7–4.0)
MCH: 30.7 pg (ref 26.0–34.0)
MCHC: 35.3 g/dL (ref 30.0–36.0)
MCV: 86.9 fL (ref 80.0–100.0)
Monocytes Absolute: 0.8 10*3/uL (ref 0.1–1.0)
Monocytes Relative: 4 %
Neutro Abs: 16 10*3/uL — ABNORMAL HIGH (ref 1.7–7.7)
Neutrophils Relative %: 87 %
Platelets: 201 10*3/uL (ref 150–400)
RBC: 5.21 MIL/uL (ref 4.22–5.81)
RDW: 13.1 % (ref 11.5–15.5)
WBC: 18.4 10*3/uL — ABNORMAL HIGH (ref 4.0–10.5)
nRBC: 0 % (ref 0.0–0.2)

## 2019-04-06 LAB — C-REACTIVE PROTEIN: CRP: 1 mg/dL — ABNORMAL HIGH (ref <1.0)

## 2019-04-06 LAB — COMPREHENSIVE METABOLIC PANEL
ALT: 76 U/L — ABNORMAL HIGH (ref 0–44)
AST: 62 U/L — ABNORMAL HIGH (ref 15–41)
Albumin: 3.5 g/dL (ref 3.5–5.0)
Alkaline Phosphatase: 70 U/L (ref 38–126)
Anion gap: 13 (ref 5–15)
BUN: 36 mg/dL — ABNORMAL HIGH (ref 8–23)
CO2: 25 mmol/L (ref 22–32)
Calcium: 8.7 mg/dL — ABNORMAL LOW (ref 8.9–10.3)
Chloride: 98 mmol/L (ref 98–111)
Creatinine, Ser: 1.26 mg/dL — ABNORMAL HIGH (ref 0.61–1.24)
GFR calc Af Amer: >60 mL/min (ref >60)
GFR calc non Af Amer: 56 mL/min — ABNORMAL LOW (ref >60)
Glucose, Bld: 179 mg/dL — ABNORMAL HIGH (ref 70–99)
Potassium: 4.1 mmol/L (ref 3.5–5.1)
Sodium: 136 mmol/L (ref 135–145)
Total Bilirubin: 0.9 mg/dL (ref 0.3–1.2)
Total Protein: 6.4 g/dL — ABNORMAL LOW (ref 6.5–8.1)

## 2019-04-06 LAB — GLUCOSE, CAPILLARY
Glucose-Capillary: 152 mg/dL — ABNORMAL HIGH (ref 70–99)
Glucose-Capillary: 205 mg/dL — ABNORMAL HIGH (ref 70–99)
Glucose-Capillary: 182 mg/dL — ABNORMAL HIGH (ref 70–99)
Glucose-Capillary: 261 mg/dL — ABNORMAL HIGH (ref 70–99)

## 2019-04-06 LAB — D-DIMER, QUANTITATIVE: D-Dimer, Quant: 1.77 ug/mL-FEU — ABNORMAL HIGH (ref 0.00–0.50)

## 2019-04-06 MED ORDER — FUROSEMIDE 10 MG/ML IJ SOLN
40.0000 mg | Freq: Once | INTRAMUSCULAR | Status: AC
  Administered 2019-04-06: 40 mg via INTRAVENOUS
  Filled 2019-04-06: qty 4

## 2019-04-06 MED ORDER — QUETIAPINE FUMARATE 25 MG PO TABS
25.0000 mg | ORAL_TABLET | Freq: Two times a day (BID) | ORAL | Status: DC
  Administered 2019-04-06 – 2019-04-10 (×8): 25 mg via ORAL
  Filled 2019-04-06 (×8): qty 1

## 2019-04-06 MED ORDER — DEXAMETHASONE 4 MG PO TABS
4.0000 mg | ORAL_TABLET | Freq: Every day | ORAL | Status: DC
  Administered 2019-04-07 – 2019-04-10 (×4): 4 mg via ORAL
  Filled 2019-04-06 (×4): qty 1

## 2019-04-06 NOTE — Progress Notes (Addendum)
1905 Report received from [facility] . Pt assisted to [facility], bowel movement, pt tolerated moving to the commode and back to bed well. HFNC  3L. Patient less confused . A0X4 at the moment. New linens applied to bed. New gown. Bed alarmed. Call bell in reach.   1930 HCS Wife Olegario Messier called and updated on plan of care.   2300 Pt getting confused on place. Pt washed up in the bed. Trying to pull of tele monitoring. Re oriented to place and time. Bed alarmed.

## 2019-04-06 NOTE — Progress Notes (Signed)
PROGRESS NOTE  Jonathon Stevens HEN:[id number] DOB: Jan 11, 1946 DOA: 03/30/2019  PCP: Delorse Lek, MD  Brief History/Interval Summary: 74 y.o.malewith medical history significant ofPAF on xarelto, prior stroke, prediabetes. Patient presented to ED with c/o generalized weakness, malaise, mild diarrhea. No N/V, no SOB nor cough. No known COVID exposures.  Reason for Visit: Atrial fibrillation with RVR.  Pneumonia due to COVID-19.  Acute respiratory failure with hypoxia.  Consultants: None  Procedures: None  Antibiotics: Anti-infectives (From admission, onward)   Start     Dose/Rate Route Frequency Ordered Stop   04/02/19 1745  cefTRIAXone (ROCEPHIN) 1 g in sodium chloride 0.9 % 100 mL IVPB     1 g 200 mL/hr over 30 Minutes Intravenous Every 24 hours 04/02/19 1736 04/07/19 1744   04/01/19 1000  remdesivir 100 mg in sodium chloride 0.9 % 100 mL IVPB     100 mg 200 mL/hr over 30 Minutes Intravenous Daily 03/30/19 2352 04/04/19 1102   03/31/19 0030  remdesivir 200 mg in sodium chloride 0.9% 250 mL IVPB     200 mg 580 mL/hr over 30 Minutes Intravenous Once 03/30/19 2352 03/31/19 0241      Subjective/Interval History: Per nursing reports the patient again had hallucinations overnight.  Seems to be okay this morning.  Denies any hallucinations currently.  Shortness of breath is improving.  Patient denies any chest pain.  Dry cough.      Assessment/Plan:  Acute Hypoxic Resp. Failure/Pneumonia due to COVID-19  Recent Labs  Lab 03/30/19 2146 03/30/19 2146 03/31/19 0248 04/02/19 0224 04/03/19 0202 04/04/19 0010 04/05/19 0454 04/06/19 0240  DDIMER  --    < >  --  12.34* 2.99* 2.18* 1.63* 1.77*  FERRITIN 891*  --   --   --   --   --   --   --   CRP 2.0*   < >  --  6.4* 5.3* 3.1* 1.6* 1.0*  ALT 30   < >  --  57* 53* 56* 66* 76*  PROCALCITON <0.10  --  0.16 1.48 0.80 0.44 0.18  --    < > = values in this interval not displayed.    Objective findings: Fever: Remains  afebrile Oxygen requirements: HFNC down to 4 L saturating in the early 90s.    COVID 19 Therapeutics: Antibacterials: On ceftriaxone Remdesivir: Completed remdesivir on 1/8 Steroids: Dexamethasone 6 mg daily Diuretics: Given on 1/8, 1/9.  Will be repeated today. Actemra: Given Actemra on 1/6 Convalescent Plasma: Not given Vitamin C and Zinc: Continue PUD Prophylaxis: Pepcid DVT Prophylaxis: On rivaroxaban  Patient seems to be improving from a respiratory standpoint.  He was weaned down to 4 L of oxygen this morning.  He has completed course of remdesivir.  Continue steroids.  CRP is improved to 1.0.  D-dimer is 1.77.  Give additional dose of diuretic today.  Continue with incentive spirometry, mobilization, out of bed to chair. Insignificant growth of staph epidermidis noted in urine culture.  Leukocytosis is due to steroids. On this morning shows slightly improved opacities.  Atrial fibrillation with RVR Initially required Cardizem infusion.  Currently on beta-blocker.  Dose was increased.  Heart rate is better.  Continue to monitor.  Continue rivaroxaban.   Acute metabolic encephalopathy/hallucinations Acute encephalopathy most likely due to acute illness and steroids.  He is experiencing some hallucination.  CT head done a few days ago did not show any acute findings.  Continue to reorient.  Should improve as steroid is tapered down.  Could give him Haldol if symptoms persist.  No focal deficits on examination.  Stable for the most part.  Transaminitis Due to COVID-19.  Will need follow-up in the outpatient setting.  Hyponatremia Monitor.  Prediabetes HbA1c 6.6.  Elevated CBGs most likely due to steroids.  Started on Lantus yesterday.  Continue SSI.  Continue to monitor CBGs.  Gout Continue allopurinol.  History of stroke On anticoagulation for stroke prevention.  PT and OT evaluation.  Home health recommended with supervision.   DVT Prophylaxis: Rivaroxaban Code Status:  Full code Family Communication: Wife being updated daily. Disposition Plan: Continue to mobilize.  Hopefully will be able to return home when improved.   Medications:  Scheduled: . allopurinol  300 mg Oral Daily  . vitamin C  500 mg Oral Daily  . atorvastatin  10 mg Oral q1800  . Chlorhexidine Gluconate Cloth  6 each Topical Q0600  . dexamethasone  6 mg Oral Daily  . famotidine  20 mg Oral Daily  . insulin aspart  0-15 Units Subcutaneous TID WC  . insulin aspart  0-5 Units Subcutaneous QHS  . insulin glargine  8 Units Subcutaneous Daily  . mouth rinse  15 mL Mouth Rinse BID  . metoprolol tartrate  50 mg Oral BID  . multivitamin with minerals  1 tablet Oral Daily  . QUEtiapine  25 mg Oral BID  . rivaroxaban  20 mg Oral Daily  . zinc sulfate  220 mg Oral Daily   Continuous: . cefTRIAXone (ROCEPHIN)  IV 1 g (04/05/19 1709)   DGU:YQIHKVQQVZDGL, chlorpheniramine-HYDROcodone, guaiFENesin-dextromethorphan, haloperidol lactate, loperamide, ondansetron **OR** ondansetron (ZOFRAN) IV   Objective:  Vital Signs  Vitals:   04/06/19 0845 04/06/19 0925 04/06/19 1134 04/06/19 1200  BP:  120/77 121/75 (!) 128/95  Pulse: (!) 110 (!) 102 73 91  Resp: (!) 22  13 17   Temp:   (!) 97.3 F (36.3 C)   TempSrc:   Oral   SpO2: 96%  94% 91%  Weight:      Height:        Intake/Output Summary (Last 24 hours) at 04/06/2019 1320 Last data filed at 04/06/2019 0500 Gross per 24 hour  Intake 0 ml  Output 2100 ml  Net -2100 ml   Filed Weights   03/30/19 2101 04/01/19 2135  Weight: 83.9 kg 84.6 kg    General appearance: Awake alert.  In no distress Resp: Less tachypneic.  Coarse breath sounds with crackles at the bases.  No wheezing or rhonchi. Cardio: S1-S2 is normal regular.  No S3-S4.  No rubs murmurs or bruit GI: Abdomen is soft.  Nontender nondistended.  Bowel sounds are present normal.  No masses organomegaly Extremities: No edema.  Full range of motion of lower extremities. Neurologic:    No focal neurological deficits.     Lab Results:  Data Reviewed: I have personally reviewed following labs and imaging studies  CBC: Recent Labs  Lab 04/02/19 0224 04/03/19 0202 04/04/19 0010 04/05/19 0454 04/06/19 0240  WBC 5.8 2.6* 6.6 11.6* 18.4*  NEUTROABS 4.6 1.9 5.4 10.3* 16.0*  HGB 15.8 15.0 15.5 14.8 16.0  HCT 45.9 43.7 43.9 42.3 45.3  MCV 88.3 87.6 87.6 86.2 86.9  PLT 95* 97* 127* 170 201    Basic Metabolic Panel: Recent Labs  Lab 04/02/19 0224 04/03/19 0202 04/04/19 0010 04/05/19 0454 04/06/19 0240  NA 125* 129* 134* 133* 136  K 3.5 4.2 4.2 3.7 4.1  CL 93* 97* 99 98 98  CO2 21* 20* 23  24 25  GLUCOSE 191* 195* 160* 197* 179*  BUN 25* 25* 29* 33* 36*  CREATININE 1.32* 1.14 1.05 1.16 1.26*  CALCIUM 8.2* 8.1* 8.7* 8.3* 8.7*    GFR: Estimated Creatinine Clearance: 55.6 mL/min (A) (by C-G formula based on SCr of 1.26 mg/dL (H)).  Liver Function Tests: Recent Labs  Lab 04/02/19 0224 04/03/19 0202 04/04/19 0010 04/05/19 0454 04/06/19 0240  AST 115* 86* 65* 67* 62*  ALT 57* 53* 56* 66* 76*  ALKPHOS 56 53 60 59 70  BILITOT 0.7 1.2 0.8 1.0 0.9  PROT 6.4* 6.2* 6.4* 5.9* 6.4*  ALBUMIN 3.4* 3.2* 3.3* 3.2* 3.5    Coagulation Profile: Recent Labs  Lab 03/30/19 2146  INR 1.3*    CBG: Recent Labs  Lab 04/05/19 1129 04/05/19 1659 04/05/19 1918 04/06/19 0732 04/06/19 1115  GLUCAP 214* 219* 248* 152* 205*     Recent Results (from the past 240 hour(s))  Blood Culture (routine x 2)     Status: None   Collection Time: 03/30/19  9:18 PM   Specimen: BLOOD LEFT FOREARM  Result Value Ref Range Status   Specimen Description   Final    BLOOD LEFT FOREARM Performed at [facility], [place] [address], [place], [place] [address]    Special Requests   Final    BOTTLES DRAWN AEROBIC AND ANAEROBIC Blood Culture adequate volume Performed at [facility] [address] [place], [place] [address]    Culture   Final    NO  GROWTH 5 DAYS Performed at [facility], [place] [address] [place], [place] [address]    Report Status 04/05/2019 FINAL  Final  Blood Culture (routine x 2)     Status: None   Collection Time: 03/30/19  9:46 PM   Specimen: BLOOD  Result Value Ref Range Status   Specimen Description   Final    BLOOD RIGHT ANTECUBITAL Performed at [facility] [address], [place], [place] [address]    Special Requests   Final    BOTTLES DRAWN AEROBIC AND ANAEROBIC Blood Culture adequate volume Performed at [facility] [address], [place], [place] [address]    Culture   Final    NO GROWTH 5 DAYS Performed at [facility], [place] [address], [place], [place] [address]    Report Status 04/05/2019 FINAL  Final  Urine culture     Status: Abnormal   Collection Time: 03/31/19  5:30 AM   Specimen: In/Out Cath Urine  Result Value Ref Range Status   Specimen Description   Final    IN/OUT CATH URINE Performed at [facility] [address], [place], [place] [address]    Special Requests   Final    NONE Performed at [facility], [place] [address], [place], [place] [address]    Culture 300 COLONIES/mL STAPHYLOCOCCUS EPIDERMIDIS (A)  Final   Report Status 04/03/2019 FINAL  Final   Organism ID, Bacteria STAPHYLOCOCCUS EPIDERMIDIS (A)  Final      Susceptibility   Staphylococcus epidermidis - MIC*    CIPROFLOXACIN <=0.5 SENSITIVE Sensitive     GENTAMICIN <=0.5 SENSITIVE Sensitive     NITROFURANTOIN <=16 SENSITIVE Sensitive     OXACILLIN <=0.25 SENSITIVE Sensitive     TETRACYCLINE <=1 SENSITIVE Sensitive     VANCOMYCIN 1 SENSITIVE Sensitive     TRIMETH/SULFA <=10 SENSITIVE Sensitive     CLINDAMYCIN <=0.25 SENSITIVE Sensitive  RIFAMPIN <=0.5 SENSITIVE Sensitive     Inducible Clindamycin NEGATIVE Sensitive     * 300 COLONIES/mL STAPHYLOCOCCUS EPIDERMIDIS  MRSA PCR Screening     Status: None   Collection Time:  04/01/19  9:30 PM   Specimen: Nasopharyngeal  Result Value Ref Range Status   MRSA by PCR NEGATIVE NEGATIVE Final    Comment:        The GeneXpert MRSA Assay (FDA approved for NASAL specimens only), is one component of a comprehensive MRSA colonization surveillance program. It is not intended to diagnose MRSA infection nor to guide or monitor treatment for MRSA infections. Performed at [facility], [address] [place], [place] [address]       Radiology Studies: PCXR COVID AM  Result Date: 04/06/2019 CLINICAL DATA:  COVID-19 positive, shortness of breath EXAM: PORTABLE CHEST 1 VIEW COMPARISON:  Chest radiograph dated 04/02/2019 FINDINGS: The heart size is normal. Vascular calcifications are seen in the aortic arch. Mild peripheral predominant interstitial and airspace opacities appear slightly decreased since 04/02/2019 given differences in technique. No pleural effusion or pneumothorax is noted. The osseous structures are intact. IMPRESSION: 1. Mild peripheral predominant interstitial and airspace opacities appear slightly decreased since 04/01/18. Electronically Signed   By: Romona Curls M.D.   On: 04/06/2019 09:29       LOS: 6 days   Jakorey Mcconathy Rito Ehrlich  [facility] Pager on [email or web address]  04/06/2019, 1:20 PM

## 2019-04-07 LAB — CBC WITH DIFFERENTIAL/PLATELET
Abs Immature Granulocytes: 0.27 10*3/uL — ABNORMAL HIGH (ref 0.00–0.07)
Basophils Absolute: 0.1 10*3/uL (ref 0.0–0.1)
Basophils Relative: 0 %
Eosinophils Absolute: 0 10*3/uL (ref 0.0–0.5)
Eosinophils Relative: 0 %
HCT: 44.2 % (ref 39.0–52.0)
Hemoglobin: 15.7 g/dL (ref 13.0–17.0)
Immature Granulocytes: 2 %
Lymphocytes Relative: 7 %
Lymphs Abs: 1.3 10*3/uL (ref 0.7–4.0)
MCH: 30.8 pg (ref 26.0–34.0)
MCHC: 35.5 g/dL (ref 30.0–36.0)
MCV: 86.8 fL (ref 80.0–100.0)
Monocytes Absolute: 0.9 10*3/uL (ref 0.1–1.0)
Monocytes Relative: 5 %
Neutro Abs: 15.3 10*3/uL — ABNORMAL HIGH (ref 1.7–7.7)
Neutrophils Relative %: 86 %
Platelets: 230 10*3/uL (ref 150–400)
RBC: 5.09 MIL/uL (ref 4.22–5.81)
RDW: 13 % (ref 11.5–15.5)
WBC: 17.8 10*3/uL — ABNORMAL HIGH (ref 4.0–10.5)
nRBC: 0 % (ref 0.0–0.2)

## 2019-04-07 LAB — GLUCOSE, CAPILLARY
Glucose-Capillary: 180 mg/dL — ABNORMAL HIGH (ref 70–99)
Glucose-Capillary: 200 mg/dL — ABNORMAL HIGH (ref 70–99)
Glucose-Capillary: 214 mg/dL — ABNORMAL HIGH (ref 70–99)

## 2019-04-07 LAB — COMPREHENSIVE METABOLIC PANEL
ALT: 81 U/L — ABNORMAL HIGH (ref 0–44)
AST: 63 U/L — ABNORMAL HIGH (ref 15–41)
Albumin: 3.6 g/dL (ref 3.5–5.0)
Alkaline Phosphatase: 68 U/L (ref 38–126)
Anion gap: 18 — ABNORMAL HIGH (ref 5–15)
BUN: 40 mg/dL — ABNORMAL HIGH (ref 8–23)
CO2: 23 mmol/L (ref 22–32)
Calcium: 8.4 mg/dL — ABNORMAL LOW (ref 8.9–10.3)
Chloride: 96 mmol/L — ABNORMAL LOW (ref 98–111)
Creatinine, Ser: 1.23 mg/dL (ref 0.61–1.24)
GFR calc Af Amer: >60 mL/min (ref >60)
GFR calc non Af Amer: 58 mL/min — ABNORMAL LOW (ref >60)
Glucose, Bld: 162 mg/dL — ABNORMAL HIGH (ref 70–99)
Potassium: 3.8 mmol/L (ref 3.5–5.1)
Sodium: 137 mmol/L (ref 135–145)
Total Bilirubin: 1.3 mg/dL — ABNORMAL HIGH (ref 0.3–1.2)
Total Protein: 6.4 g/dL — ABNORMAL LOW (ref 6.5–8.1)

## 2019-04-07 MED ORDER — METOPROLOL TARTRATE 5 MG/5ML IV SOLN
2.5000 mg | Freq: Four times a day (QID) | INTRAVENOUS | Status: DC | PRN
  Administered 2019-04-08 (×2): 2.5 mg via INTRAVENOUS
  Filled 2019-04-07 (×3): qty 5

## 2019-04-07 MED ORDER — FUROSEMIDE 10 MG/ML IJ SOLN
40.0000 mg | Freq: Once | INTRAMUSCULAR | Status: AC
  Administered 2019-04-07: 40 mg via INTRAVENOUS
  Filled 2019-04-07: qty 4

## 2019-04-07 NOTE — Progress Notes (Signed)
Ambulation Note  Saturation Pre: 91% on 4L  Ambulation Distance: 60 ft  Saturation During Ambulation: 92-93% on 4L  Notes: Pt used RW. Pt c/o dizziness and SOB. Pt returned to bed with call bell within reach. Pt on 4L with O2 sats at 94%.  Alisia Ferrari, MS, ACSM CEP 11:43 AM 04/07/2019

## 2019-04-07 NOTE — Progress Notes (Signed)
PROGRESS NOTE  Jonathon Stevens DJT:[id number] DOB: [birth year] DOA: 03/30/2019  PCP: Delorse Lek, MD  Brief History/Interval Summary: 74 y.o.malewith medical history significant ofPAF on xarelto, prior stroke, prediabetes. Patient presented to ED with c/o generalized weakness, malaise, mild diarrhea. No N/V, no SOB nor cough. No known COVID exposures.  Reason for Visit: Atrial fibrillation with RVR.  Pneumonia due to COVID-19.  Acute respiratory failure with hypoxia.  Consultants: None  Procedures: None  Antibiotics: Anti-infectives (From admission, onward)   Start     Dose/Rate Route Frequency Ordered Stop   04/02/19 1745  cefTRIAXone (ROCEPHIN) 1 g in sodium chloride 0.9 % 100 mL IVPB     1 g 200 mL/hr over 30 Minutes Intravenous Every 24 hours 04/02/19 1736 04/06/19 1827   04/01/19 1000  remdesivir 100 mg in sodium chloride 0.9 % 100 mL IVPB     100 mg 200 mL/hr over 30 Minutes Intravenous Daily 03/30/19 2352 04/04/19 1102   03/31/19 0030  remdesivir 200 mg in sodium chloride 0.9% 250 mL IVPB     200 mg 580 mL/hr over 30 Minutes Intravenous Once 03/30/19 2352 03/31/19 0241      Subjective/Interval History: Patient continues to have some confusion during the evening and night hours.  He states that he is feeling well.  Was a little distracted this morning but was able to answer all of my questions.  Cough is getting better.      Assessment/Plan:  Acute Hypoxic Resp. Failure/Pneumonia due to COVID-19  Recent Labs  Lab 04/02/19 0224 04/03/19 0202 04/04/19 0010 04/05/19 0454 04/06/19 0240 04/07/19 0439  DDIMER 12.34* 2.99* 2.18* 1.63* 1.77*  --   CRP 6.4* 5.3* 3.1* 1.6* 1.0*  --   ALT 57* 53* 56* 66* 76* 81*  PROCALCITON 1.48 0.80 0.44 0.18  --   --     Objective findings: Fever: Remains afebrile Oxygen requirements: On high flow nasal cannula.  Noted to be on 5 L.  Saturating in the mid 90s.  He was dropped down to 4 4 L.  COVID 19  Therapeutics: Antibacterials: On ceftriaxone for a 5-day course Remdesivir: Completed remdesivir on 1/8 Steroids: Dexamethasone 4 mg daily Diuretics: Given on 1/8, 1/9, 1/10.  We will repeat today. Actemra: Given Actemra on 1/6 Convalescent Plasma: Not given Vitamin C and Zinc: Continue PUD Prophylaxis: Pepcid DVT Prophylaxis: On rivaroxaban  Patient seems to be stable from a respiratory standpoint.  He is still requiring 4 to 6 L of oxygen depending on when she exerts.  Continue incentive spirometry mobilization.  He has completed course of remdesivir.  He remains on steroids which is being tapered down due to his encephalopathy.  He remains on antibacterials.  CRP had improved to 1.0.  D-dimer had improved as well.  Continue with diuretics for now.  Continue with incentive spirometry, mobilization, out of bed to chair. Insignificant growth of staph epidermidis noted in urine culture.  Leukocytosis is due to steroids.  Chest x-ray done yesterday morning showed slightly improved opacities.    Atrial fibrillation with RVR Initially required Cardizem infusion.  Heart rate seems to be stable.  Continue current dose of beta-blocker.  Continue monitoring on telemetry.  Continue rivaroxaban.  Acute metabolic encephalopathy/hallucinations Acute encephalopathy most likely due to acute illness and steroids.  He has experienced some visual hallucinations.  CT head done a few days ago did not show any acute findings.  He does not have any focal neurological deficits.  Taper down steroids.  Haldol.  He is on Seroquel.  Would not increase the dose as he is noted to have slightly elevated QT interval.  Transaminitis Due to COVID-19.  Will need follow-up in the outpatient setting.  Hyponatremia Improved.  Continue to monitor..  Prediabetes HbA1c 6.6.  Elevated CBGs most likely due to steroids.  Was started on Lantus.  Continue SSI.  Monitor closely.    Gout Continue allopurinol.  History of  stroke On anticoagulation for stroke prevention.  PT and OT evaluation.  Home health recommended with supervision.   DVT Prophylaxis: Rivaroxaban Code Status: Full code Family Communication: Wife being updated daily. Disposition Plan: Continue to mobilize.  Hopefully will be able to return home when improved.   Medications:  Scheduled: . allopurinol  300 mg Oral Daily  . vitamin C  500 mg Oral Daily  . atorvastatin  10 mg Oral q1800  . Chlorhexidine Gluconate Cloth  6 each Topical Q0600  . dexamethasone  4 mg Oral Daily  . famotidine  20 mg Oral Daily  . insulin aspart  0-15 Units Subcutaneous TID WC  . insulin aspart  0-5 Units Subcutaneous QHS  . insulin glargine  8 Units Subcutaneous Daily  . mouth rinse  15 mL Mouth Rinse BID  . metoprolol tartrate  50 mg Oral BID  . multivitamin with minerals  1 tablet Oral Daily  . QUEtiapine  25 mg Oral BID  . rivaroxaban  20 mg Oral Daily  . zinc sulfate  220 mg Oral Daily   Continuous:  NLZ:[id number], chlorpheniramine-HYDROcodone, guaiFENesin-dextromethorphan, haloperidol lactate, loperamide, ondansetron **OR** ondansetron (ZOFRAN) IV   Objective:  Vital Signs  Vitals:   04/06/19 1918 04/07/19 0008 04/07/19 0440 04/07/19 0748  BP: 106/89 134/88 121/75 140/79  Pulse:  77 100 (!) 103  Resp: (!) 21 19 12    Temp: (!) 97.5 F (36.4 C) 97.7 F (36.5 C) 97.6 F (36.4 C) 97.9 F (36.6 C)  TempSrc: Oral Oral Oral Oral  SpO2: 95% 92% 92% 92%  Weight:      Height:        Intake/Output Summary (Last 24 hours) at 04/07/2019 1046 Last data filed at 04/07/2019 0758 Gross per 24 hour  Intake 720 ml  Output 2450 ml  Net -1730 ml   Filed Weights   03/30/19 2101 04/01/19 2135  Weight: 83.9 kg 84.6 kg    General appearance: Awake alert.  In no distress.  Slightly distracted. Resp: Tachypneic.  No use of accessory muscles.  Crackles bilateral bases.  No wheezing or rhonchi. Cardio: S1-S2 is normal regular.  No S3-S4.  No rubs  murmurs or bruit GI: Abdomen is soft.  Nontender nondistended.  Bowel sounds are present normal.  No masses organomegaly Extremities: No edema.  Moving all his extremities Neurologic: Oriented to year or month person.  Required some help identifying where he was.  No focal neurological deficits.      Lab Results:  Data Reviewed: I have personally reviewed following labs and imaging studies  CBC: Recent Labs  Lab 04/03/19 0202 04/04/19 0010 04/05/19 0454 04/06/19 0240 04/07/19 0439  WBC 2.6* 6.6 11.6* 18.4* 17.8*  NEUTROABS 1.9 5.4 10.3* 16.0* 15.3*  HGB 15.0 15.5 14.8 16.0 15.7  HCT 43.7 43.9 42.3 45.3 44.2  MCV 87.6 87.6 86.2 86.9 86.8  PLT 97* 127* 170 201 230    Basic Metabolic Panel: Recent Labs  Lab 04/03/19 0202 04/04/19 0010 04/05/19 0454 04/06/19 0240 04/07/19 0439  NA 129* 134* 133* 136 137  K 4.2  4.2 3.7 4.1 3.8  CL 97* 99 98 98 96*  CO2 20* 23 24 25 23   GLUCOSE 195* 160* 197* 179* 162*  BUN 25* 29* 33* 36* 40*  CREATININE 1.14 1.05 1.16 1.26* 1.23  CALCIUM 8.1* 8.7* 8.3* 8.7* 8.4*    GFR: Estimated Creatinine Clearance: 57 mL/min (by C-G formula based on SCr of 1.23 mg/dL).  Liver Function Tests: Recent Labs  Lab 04/03/19 0202 04/04/19 0010 04/05/19 0454 04/06/19 0240 04/07/19 0439  AST 86* 65* 67* 62* 63*  ALT 53* 56* 66* 76* 81*  ALKPHOS 53 60 59 70 68  BILITOT 1.2 0.8 1.0 0.9 1.3*  PROT 6.2* 6.4* 5.9* 6.4* 6.4*  ALBUMIN 3.2* 3.3* 3.2* 3.5 3.6    Coagulation Profile: No results for input(s): INR, PROTIME in the last 168 hours.  CBG: Recent Labs  Lab 04/06/19 0732 04/06/19 1115 04/06/19 1524 04/06/19 1957 04/07/19 0806  GLUCAP 152* 205* 182* 261* 180*     Recent Results (from the past 240 hour(s))  Blood Culture (routine x 2)     Status: None   Collection Time: 03/30/19  9:18 PM   Specimen: BLOOD LEFT FOREARM  Result Value Ref Range Status   Specimen Description   Final    BLOOD LEFT FOREARM Performed at [facility], [place] [address], [place], [place] [address]    Special Requests   Final    BOTTLES DRAWN AEROBIC AND ANAEROBIC Blood Culture adequate volume Performed at [facility] [address], [place], [place] [address]    Culture   Final    NO GROWTH 5 DAYS Performed at [facility], [place] [address], [place], [place] [address]    Report Status 04/05/2019 FINAL  Final  Blood Culture (routine x 2)     Status: None   Collection Time: 03/30/19  9:46 PM   Specimen: BLOOD  Result Value Ref Range Status   Specimen Description   Final    BLOOD RIGHT ANTECUBITAL Performed at [facility] [address] [place], [place] [address]    Special Requests   Final    BOTTLES DRAWN AEROBIC AND ANAEROBIC Blood Culture adequate volume Performed at [facility] [address] [place], [place] [address]    Culture   Final    NO GROWTH 5 DAYS Performed at [facility], [place] [address] [place], [place] [address]    Report Status 04/05/2019 FINAL  Final  Urine culture     Status: Abnormal   Collection Time: 03/31/19  5:30 AM   Specimen: In/Out Cath Urine  Result Value Ref Range Status   Specimen Description   Final    IN/OUT CATH URINE Performed at [facility] [address] [place], [place] [address]    Special Requests   Final    NONE Performed at [facility], [place] [address], [place], [place] [address]    Culture 300 COLONIES/mL STAPHYLOCOCCUS EPIDERMIDIS (A)  Final   Report Status 04/03/2019 FINAL  Final   Organism ID, Bacteria STAPHYLOCOCCUS EPIDERMIDIS (A)  Final      Susceptibility   Staphylococcus epidermidis - MIC*    CIPROFLOXACIN <=0.5 SENSITIVE Sensitive     GENTAMICIN <=0.5 SENSITIVE Sensitive     NITROFURANTOIN <=16 SENSITIVE Sensitive     OXACILLIN <=0.25 SENSITIVE Sensitive     TETRACYCLINE <=1 SENSITIVE Sensitive     VANCOMYCIN 1 SENSITIVE Sensitive  TRIMETH/SULFA <=10 SENSITIVE Sensitive     CLINDAMYCIN <=0.25 SENSITIVE Sensitive     RIFAMPIN <=0.5 SENSITIVE Sensitive     Inducible Clindamycin NEGATIVE Sensitive     * 300 COLONIES/mL STAPHYLOCOCCUS EPIDERMIDIS  MRSA PCR Screening     Status: None   Collection Time: 04/01/19  9:30 PM   Specimen: Nasopharyngeal  Result Value Ref Range Status   MRSA by PCR NEGATIVE NEGATIVE Final    Comment:        The GeneXpert MRSA Assay (FDA approved for NASAL specimens only), is one component of a comprehensive MRSA colonization surveillance program. It is not intended to diagnose MRSA infection nor to guide or monitor treatment for MRSA infections. Performed at [facility], [address], [place], [place] [address]       Radiology Studies: PCXR COVID AM  Result Date: 04/06/2019 CLINICAL DATA:  COVID-19 positive, shortness of breath EXAM: PORTABLE CHEST 1 VIEW COMPARISON:  Chest radiograph dated 04/02/2019 FINDINGS: The heart size is normal. Vascular calcifications are seen in the aortic arch. Mild peripheral predominant interstitial and airspace opacities appear slightly decreased since 04/02/2019 given differences in technique. No pleural effusion or pneumothorax is noted. The osseous structures are intact. IMPRESSION: 1. Mild peripheral predominant interstitial and airspace opacities appear slightly decreased since 04/01/18. Electronically Signed   By: Romona Curls M.D.   On: 04/06/2019 09:29       LOS: 7 days   Ashland Wiseman Rito Ehrlich  [facility] Pager on [email or web address]  04/07/2019, 10:46 AM

## 2019-04-08 LAB — CBC WITH DIFFERENTIAL/PLATELET
Abs Immature Granulocytes: 0.42 10*3/uL — ABNORMAL HIGH (ref 0.00–0.07)
Basophils Absolute: 0.1 10*3/uL (ref 0.0–0.1)
Basophils Relative: 1 %
Eosinophils Absolute: 0 10*3/uL (ref 0.0–0.5)
Eosinophils Relative: 0 %
HCT: 48.4 % (ref 39.0–52.0)
Hemoglobin: 16.7 g/dL (ref 13.0–17.0)
Immature Granulocytes: 3 %
Lymphocytes Relative: 10 %
Lymphs Abs: 1.4 10*3/uL (ref 0.7–4.0)
MCH: 30.5 pg (ref 26.0–34.0)
MCHC: 34.5 g/dL (ref 30.0–36.0)
MCV: 88.3 fL (ref 80.0–100.0)
Monocytes Absolute: 0.8 10*3/uL (ref 0.1–1.0)
Monocytes Relative: 6 %
Neutro Abs: 12 10*3/uL — ABNORMAL HIGH (ref 1.7–7.7)
Neutrophils Relative %: 80 %
Platelets: 261 10*3/uL (ref 150–400)
RBC: 5.48 MIL/uL (ref 4.22–5.81)
RDW: 13.2 % (ref 11.5–15.5)
WBC: 14.8 10*3/uL — ABNORMAL HIGH (ref 4.0–10.5)
nRBC: 0.2 % (ref 0.0–0.2)

## 2019-04-08 LAB — COMPREHENSIVE METABOLIC PANEL
ALT: 70 U/L — ABNORMAL HIGH (ref 0–44)
AST: 67 U/L — ABNORMAL HIGH (ref 15–41)
Albumin: 3.6 g/dL (ref 3.5–5.0)
Alkaline Phosphatase: 69 U/L (ref 38–126)
Anion gap: 13 (ref 5–15)
BUN: 38 mg/dL — ABNORMAL HIGH (ref 8–23)
CO2: 27 mmol/L (ref 22–32)
Calcium: 8.9 mg/dL (ref 8.9–10.3)
Chloride: 96 mmol/L — ABNORMAL LOW (ref 98–111)
Creatinine, Ser: 1.35 mg/dL — ABNORMAL HIGH (ref 0.61–1.24)
GFR calc Af Amer: 60 mL/min — ABNORMAL LOW (ref >60)
GFR calc non Af Amer: 52 mL/min — ABNORMAL LOW (ref >60)
Glucose, Bld: 135 mg/dL — ABNORMAL HIGH (ref 70–99)
Potassium: 5 mmol/L (ref 3.5–5.1)
Sodium: 136 mmol/L (ref 135–145)
Total Bilirubin: 2 mg/dL — ABNORMAL HIGH (ref 0.3–1.2)
Total Protein: 6.7 g/dL (ref 6.5–8.1)

## 2019-04-08 LAB — GLUCOSE, CAPILLARY
Glucose-Capillary: 176 mg/dL — ABNORMAL HIGH (ref 70–99)
Glucose-Capillary: 137 mg/dL — ABNORMAL HIGH (ref 70–99)
Glucose-Capillary: 142 mg/dL — ABNORMAL HIGH (ref 70–99)
Glucose-Capillary: 175 mg/dL — ABNORMAL HIGH (ref 70–99)
Glucose-Capillary: 204 mg/dL — ABNORMAL HIGH (ref 70–99)

## 2019-04-08 MED ORDER — METOPROLOL TARTRATE 50 MG PO TABS
75.0000 mg | ORAL_TABLET | Freq: Two times a day (BID) | ORAL | Status: DC
  Administered 2019-04-08 – 2019-04-09 (×2): 75 mg via ORAL
  Filled 2019-04-08: qty 1

## 2019-04-08 MED ORDER — METOPROLOL TARTRATE 25 MG PO TABS
25.0000 mg | ORAL_TABLET | Freq: Once | ORAL | Status: AC
  Administered 2019-04-08: 25 mg via ORAL
  Filled 2019-04-08: qty 1

## 2019-04-08 MED ORDER — METOPROLOL TARTRATE 5 MG/5ML IV SOLN
2.5000 mg | Freq: Once | INTRAVENOUS | Status: AC
  Administered 2019-04-08: 2.5 mg via INTRAVENOUS
  Filled 2019-04-08: qty 5

## 2019-04-08 NOTE — Progress Notes (Signed)
Physical Therapy Treatment Patient Details Name: Jonathon Stevens MRN: [id number] DOB: 1945/07/11 Today's Date: 04/08/2019    History of Present Illness 74 y.o. male with medical history significant of PAF on xarelto, prior stroke, prediabetes and admitted for afib with RVR, COVID (+). Pt with acute metabolic encephalopathy; head CT negative for acute abnormality, mild cerebral atrophy and microvascular disease changes.    PT Comments    Pt did well with tx this am, did not seem to be too confused was able to follow all instructions to complete tasks given. Bed mob with min a, able to sit unsupported with good trunk control, sit<> stand w/ min guard and RW, able to ambulate approx 122ft w/ RW and min guard assist, did much better with turns also. Pt on 4L/min via [place] and able to maintain sats in low 90s throughout session.    Follow Up Recommendations  Home health PT;Supervision for mobility/OOB     Equipment Recommendations  Rolling walker with 5" wheels    Recommendations for Other Services       Precautions / Restrictions Precautions Precautions: Fall Precaution Comments: cognition  Restrictions Weight Bearing Restrictions: No    Mobility  Bed Mobility Overal bed mobility: Needs Assistance Bed Mobility: Supine to Sit     Supine to sit: Min assist Sit to supine: Supervision      Transfers Overall transfer level: Needs assistance Equipment used: Rolling walker (2 wheeled) Transfers: Sit to/from Stand Sit to Stand: Min guard            Ambulation/Gait Ambulation/Gait assistance: Min guard Gait Distance (Feet): 120 Feet Assistive device: Rolling walker (2 wheeled) Gait Pattern/deviations: Step-through pattern;Decreased stride length;Staggering right;Staggering left Gait velocity: decreased cadence       Stairs             Wheelchair Mobility    Modified Rankin (Stroke Patients Only)       Balance Overall balance assessment: Needs  assistance;History of Falls   Sitting balance-Leahy Scale: Good     Standing balance support: Bilateral upper extremity supported;During functional activity Standing balance-Leahy Scale: Fair                              Cognition Arousal/Alertness: Awake/alert Behavior During Therapy: Impulsive Overall Cognitive Status: Impaired/Different from baseline Area of Impairment: Safety/judgement                         Safety/Judgement: Decreased awareness of safety;Decreased awareness of deficits            Exercises      General Comments        Pertinent Vitals/Pain Pain Assessment: No/denies pain    Home Living                      Prior Function            PT Goals (current goals can now be found in the care plan section) Acute Rehab PT Goals Patient Stated Goal: states would like to walk around and streth some Time For Goal Achievement: 04/16/19 Potential to Achieve Goals: Good Progress towards PT goals: Progressing toward goals    Frequency    Min 3X/week      PT Plan Current plan remains appropriate    Co-evaluation              AM-PAC PT "6 Clicks" Mobility   Outcome  Measure  Help needed turning from your back to your side while in a flat bed without using bedrails?: A Little Help needed moving from lying on your back to sitting on the side of a flat bed without using bedrails?: A Little Help needed moving to and from a bed to a chair (including a wheelchair)?: A Little Help needed standing up from a chair using your arms (e.g., wheelchair or bedside chair)?: A Little Help needed to walk in hospital room?: A Little Help needed climbing 3-5 steps with a railing? : A Little 6 Click Score: 18    End of Session Equipment Utilized During Treatment: Oxygen Activity Tolerance: Patient tolerated treatment well Patient left: in bed;with call bell/phone within reach;with bed alarm set   PT Visit Diagnosis:  Difficulty in walking, not elsewhere classified (R26.2);Unsteadiness on feet (R26.81)     Time: 9290-9030 PT Time Calculation (min) (ACUTE ONLY): 29 min  Charges:  $Gait Training: 8-22 mins $Therapeutic Activity: 8-22 mins                     Drema Pry, PT    Freddi Starr 04/08/2019, 1:32 PM

## 2019-04-08 NOTE — Progress Notes (Addendum)
PROGRESS NOTE  Jonathon Stevens IOE:[id number] DOB: 11-10-1945 DOA: 03/30/2019  PCP: Stephens Shire, MD  Brief History/Interval Summary: 74 y.o.malewith medical history significant ofPAF on xarelto, prior stroke, prediabetes. Patient presented to ED with c/o generalized weakness, malaise, mild diarrhea. No N/V, no SOB nor cough. No known COVID exposures.  Reason for Visit: Atrial fibrillation with RVR.  Pneumonia due to COVID-19.  Acute respiratory failure with hypoxia.  Consultants: None  Procedures: None  Antibiotics: Anti-infectives (From admission, onward)   Start     Dose/Rate Route Frequency Ordered Stop   04/02/19 1745  cefTRIAXone (ROCEPHIN) 1 g in sodium chloride 0.9 % 100 mL IVPB     1 g 200 mL/hr over 30 Minutes Intravenous Every 24 hours 04/02/19 1736 04/06/19 1827   04/01/19 1000  remdesivir 100 mg in sodium chloride 0.9 % 100 mL IVPB     100 mg 200 mL/hr over 30 Minutes Intravenous Daily 03/30/19 2352 04/04/19 1102   03/31/19 0030  remdesivir 200 mg in sodium chloride 0.9% 250 mL IVPB     200 mg 580 mL/hr over 30 Minutes Intravenous Once 03/30/19 2352 03/31/19 0241      Subjective/Interval History: Patient continues to have some confusion and distraction during the early evening and early morning hours.  Otherwise he states that he is feeling fine.  He is feeling tired this morning.  Shortness of breath has improved.  Still noted to be on 4 L of oxygen.  No other complaints offered.      Assessment/Plan:  Acute Hypoxic Resp. Failure/Pneumonia due to COVID-19  Recent Labs  Lab 04/02/19 0224 04/03/19 0202 04/04/19 0010 04/05/19 0454 04/06/19 0240 04/07/19 0439 04/08/19 0501  DDIMER 12.34* 2.99* 2.18* 1.63* 1.77*  --   --   CRP 6.4* 5.3* 3.1* 1.6* 1.0*  --   --   ALT 57* 53* 56* 66* 76* 81* 70*  PROCALCITON 1.48 0.80 0.44 0.18  --   --   --     Objective findings: Fever: Remains afebrile Oxygen requirements: HFNC.  4 L.  Saturating in the early  90s.    COVID 19 Therapeutics: Antibacterials: Completed 5-day course of ceftriaxone Remdesivir: Completed remdesivir on 1/8 Steroids: Dexamethasone 4 mg daily Diuretics: Given on 1/8, 1/9, 1/10, 1/11.  Hold Lasix today. Actemra: Given Actemra on 1/6 Convalescent Plasma: Not given Vitamin C and Zinc: Continue PUD Prophylaxis: Pepcid DVT Prophylaxis: On rivaroxaban  Patient seems to be stable from a respiratory standpoint.  His oxygen requirements have been steadily improving.  He is down to 4 L.  Hopefully we can continue to wean it down.  He has completed course of remdesivir.  Steroids being tapered.  He also completed course of antibacterials.  His inflammatory markers improved.  Procalcitonin also improved.  He has also been getting Lasix for the last 4 days.  Hold today due to mild increase in creatinine.  Repeat labs tomorrow.  Continue with incentive spirometry, mobilization, out of bed to chair.  Leukocytosis is due to steroids and is improving.  Chest x-ray was last done on 1/10 and showed slight improvement.  Insignificant growth of staph epidermidis noted in the urine culture.  Atrial fibrillation with RVR Patient initially required Cardizem infusion.  He was subsequently started on beta-blockers with improvement in heart rate.  Occasionally noted to be tachycardic.  Could go up on the dose of the beta-blocker as his blood pressure seems to be holding steady.  Continue rivaroxaban.  Acute metabolic encephalopathy/hallucinations Acute encephalopathy  most likely due to acute illness and steroids.  He has experienced some visual hallucinations.  CT head did not show any acute findings.  No focal neurological deficits.  Steroids being weaned down.  He was also placed on Seroquel.  Will not increase the dose due to slightly increased QT interval.    Transaminitis Due to COVID-19.  Has improved.  Will need follow-up in the outpatient setting.  Hyponatremia Improved.  Continue to  monitor..  Prediabetes HbA1c 6.6.  Elevated CBGs most likely due to steroids.  Patient was started on Lantus.  SSI being continued.  May need to go down on dose of Lantus as steroid is tapered down.    Gout Continue allopurinol.  History of stroke On anticoagulation for stroke prevention.  PT and OT evaluation.  Home health recommended with supervision.   DVT Prophylaxis: Rivaroxaban Code Status: Full code Family Communication: Wife being updated daily. Disposition Plan: Continue to mobilize.  Hopefully will be able to return home when improved.   Medications:  Scheduled: . allopurinol  300 mg Oral Daily  . vitamin C  500 mg Oral Daily  . atorvastatin  10 mg Oral q1800  . Chlorhexidine Gluconate Cloth  6 each Topical Q0600  . dexamethasone  4 mg Oral Daily  . famotidine  20 mg Oral Daily  . insulin aspart  0-15 Units Subcutaneous TID WC  . insulin aspart  0-5 Units Subcutaneous QHS  . insulin glargine  8 Units Subcutaneous Daily  . mouth rinse  15 mL Mouth Rinse BID  . metoprolol tartrate  50 mg Oral BID  . multivitamin with minerals  1 tablet Oral Daily  . QUEtiapine  25 mg Oral BID  . rivaroxaban  20 mg Oral Daily  . zinc sulfate  220 mg Oral Daily   Continuous:  EHU:DJSHFWYOVZCHY, chlorpheniramine-HYDROcodone, guaiFENesin-dextromethorphan, haloperidol lactate, loperamide, metoprolol tartrate, ondansetron **OR** ondansetron (ZOFRAN) IV   Objective:  Vital Signs  Vitals:   04/08/19 0325 04/08/19 0743 04/08/19 0847 04/08/19 0945  BP: (!) 142/85 113/88  119/67  Pulse: (!) 114 82 (!) 143 92  Resp: (!) 22 20 20    Temp:  (!) 97.5 F (36.4 C)    TempSrc:  Oral    SpO2: (!) 87% 93% 94%   Weight:      Height:        Intake/Output Summary (Last 24 hours) at 04/08/2019 1054 Last data filed at 04/08/2019 0400 Gross per 24 hour  Intake 960 ml  Output 1950 ml  Net -990 ml   Filed Weights   03/30/19 2101 04/01/19 2135  Weight: 83.9 kg 84.6 kg    General  appearance: Awake alert.  In no distress.  Remains distracted Resp: Mildly tachypneic.  Coarse breath sounds bilaterally with crackles at the bases.  Improved air entry from before.  No wheezing or rhonchi.   Cardio: S1-S2 is normal regular.  No S3-S4.  No rubs murmurs or bruit GI: Abdomen is soft.  Nontender nondistended.  Bowel sounds are present normal.  No masses organomegaly Extremities: No edema.  Moving his extremities Neurologic: Slow to respond at times but oriented to person place time.  No obvious focal deficits.     Lab Results:  Data Reviewed: I have personally reviewed following labs and imaging studies  CBC: Recent Labs  Lab 04/04/19 0010 04/05/19 0454 04/06/19 0240 04/07/19 0439 04/08/19 0501  WBC 6.6 11.6* 18.4* 17.8* 14.8*  NEUTROABS 5.4 10.3* 16.0* 15.3* 12.0*  HGB 15.5 14.8 16.0 15.7  16.7  HCT 43.9 42.3 45.3 44.2 48.4  MCV 87.6 86.2 86.9 86.8 88.3  PLT 127* 170 201 230 261    Basic Metabolic Panel: Recent Labs  Lab 04/04/19 0010 04/05/19 0454 04/06/19 0240 04/07/19 0439 04/08/19 0501  NA 134* 133* 136 137 136  K 4.2 3.7 4.1 3.8 5.0  CL 99 98 98 96* 96*  CO2 23 24 25 23 27   GLUCOSE 160* 197* 179* 162* 135*  BUN 29* 33* 36* 40* 38*  CREATININE 1.05 1.16 1.26* 1.23 1.35*  CALCIUM 8.7* 8.3* 8.7* 8.4* 8.9    GFR: Estimated Creatinine Clearance: 51.9 mL/min (A) (by C-G formula based on SCr of 1.35 mg/dL (H)).  Liver Function Tests: Recent Labs  Lab 04/04/19 0010 04/05/19 0454 04/06/19 0240 04/07/19 0439 04/08/19 0501  AST 65* 67* 62* 63* 67*  ALT 56* 66* 76* 81* 70*  ALKPHOS 60 59 70 68 69  BILITOT 0.8 1.0 0.9 1.3* 2.0*  PROT 6.4* 5.9* 6.4* 6.4* 6.7  ALBUMIN 3.3* 3.2* 3.5 3.6 3.6    Coagulation Profile: No results for input(s): INR, PROTIME in the last 168 hours.  CBG: Recent Labs  Lab 04/07/19 0806 04/07/19 1213 04/07/19 1643 04/07/19 2142 04/08/19 0741  GLUCAP 180* 200* 214* 176* 137*     Recent Results (from the past 240  hour(s))  Blood Culture (routine x 2)     Status: None   Collection Time: 03/30/19  9:18 PM   Specimen: BLOOD LEFT FOREARM  Result Value Ref Range Status   Specimen Description   Final    BLOOD LEFT FOREARM Performed at [facility], [address], [place], [place]    Special Requests   Final    BOTTLES DRAWN AEROBIC AND ANAEROBIC Blood Culture adequate volume Performed at [facility], [address], [place], [place]    Culture   Final    NO GROWTH 5 DAYS Performed at [facility], [address] [place], [place]    Report Status 04/05/2019 FINAL  Final  Blood Culture (routine x 2)     Status: None   Collection Time: 03/30/19  9:46 PM   Specimen: BLOOD  Result Value Ref Range Status   Specimen Description   Final    BLOOD RIGHT ANTECUBITAL Performed at [facility], [address] [place], [place]    Special Requests   Final    BOTTLES DRAWN AEROBIC AND ANAEROBIC Blood Culture adequate volume Performed at [facility], [address], [place], [place]    Culture   Final    NO GROWTH 5 DAYS Performed at [facility], [address] [place], [place]    Report Status 04/05/2019 FINAL  Final  Urine culture     Status: Abnormal   Collection Time: 03/31/19  5:30 AM   Specimen: In/Out Cath Urine  Result Value Ref Range Status   Specimen Description   Final    IN/OUT CATH URINE Performed at [facility], [address] [place], [place]    Special Requests   Final    NONE Performed at [facility], [address], [place], [place]    Culture 300 COLONIES/mL STAPHYLOCOCCUS EPIDERMIDIS (A)  Final   Report Status 04/03/2019 FINAL  Final   Organism ID, Bacteria STAPHYLOCOCCUS EPIDERMIDIS (A)  Final      Susceptibility   Staphylococcus epidermidis -  MIC*    CIPROFLOXACIN <=0.5  SENSITIVE Sensitive     GENTAMICIN <=0.5 SENSITIVE Sensitive     NITROFURANTOIN <=16 SENSITIVE Sensitive     OXACILLIN <=0.25 SENSITIVE Sensitive     TETRACYCLINE <=1 SENSITIVE Sensitive     VANCOMYCIN 1 SENSITIVE Sensitive     TRIMETH/SULFA <=10 SENSITIVE Sensitive     CLINDAMYCIN <=0.25 SENSITIVE Sensitive     RIFAMPIN <=0.5 SENSITIVE Sensitive     Inducible Clindamycin NEGATIVE Sensitive     * 300 COLONIES/mL STAPHYLOCOCCUS EPIDERMIDIS  MRSA PCR Screening     Status: None   Collection Time: 04/01/19  9:30 PM   Specimen: Nasopharyngeal  Result Value Ref Range Status   MRSA by PCR NEGATIVE NEGATIVE Final    Comment:        The GeneXpert MRSA Assay (FDA approved for NASAL specimens only), is one component of a comprehensive MRSA colonization surveillance program. It is not intended to diagnose MRSA infection nor to guide or monitor treatment for MRSA infections. Performed at [facility], [address], [place], [place] [address]       Radiology Studies: No results found.     LOS: 8 days   Karimah Winquist [employer] on [email or web address]  04/08/2019, 10:54 AM

## 2019-04-09 LAB — CBC
HCT: 51.6 % (ref 39.0–52.0)
Hemoglobin: 17.5 g/dL — ABNORMAL HIGH (ref 13.0–17.0)
MCH: 30.2 pg (ref 26.0–34.0)
MCHC: 33.9 g/dL (ref 30.0–36.0)
MCV: 89 fL (ref 80.0–100.0)
Platelets: 251 10*3/uL (ref 150–400)
RBC: 5.8 MIL/uL (ref 4.22–5.81)
RDW: 13.6 % (ref 11.5–15.5)
WBC: 14.6 10*3/uL — ABNORMAL HIGH (ref 4.0–10.5)
nRBC: 0.2 % (ref 0.0–0.2)

## 2019-04-09 LAB — COMPREHENSIVE METABOLIC PANEL
ALT: 67 U/L — ABNORMAL HIGH (ref 0–44)
ALT: 67 U/L — ABNORMAL HIGH (ref 0–44)
AST: 48 U/L — ABNORMAL HIGH (ref 15–41)
AST: 48 U/L — ABNORMAL HIGH (ref 15–41)
Albumin: 3.8 g/dL (ref 3.5–5.0)
Albumin: 3.8 g/dL (ref 3.5–5.0)
Alkaline Phosphatase: 66 U/L (ref 38–126)
Alkaline Phosphatase: 66 U/L (ref 38–126)
Anion gap: 11 (ref 5–15)
Anion gap: 11 (ref 5–15)
BUN: 40 mg/dL — ABNORMAL HIGH (ref 8–23)
BUN: 40 mg/dL — ABNORMAL HIGH (ref 8–23)
CO2: 25 mmol/L (ref 22–32)
CO2: 25 mmol/L (ref 22–32)
Calcium: 9 mg/dL (ref 8.9–10.3)
Calcium: 9 mg/dL (ref 8.9–10.3)
Chloride: 99 mmol/L (ref 98–111)
Chloride: 99 mmol/L (ref 98–111)
Creatinine, Ser: 1.61 mg/dL — ABNORMAL HIGH (ref 0.61–1.24)
Creatinine, Ser: 1.61 mg/dL — ABNORMAL HIGH (ref 0.61–1.24)
GFR calc Af Amer: 48 mL/min — ABNORMAL LOW (ref >60)
GFR calc Af Amer: 48 mL/min — ABNORMAL LOW (ref >60)
GFR calc non Af Amer: 42 mL/min — ABNORMAL LOW (ref >60)
GFR calc non Af Amer: 42 mL/min — ABNORMAL LOW (ref >60)
Glucose, Bld: 183 mg/dL — ABNORMAL HIGH (ref 70–99)
Glucose, Bld: 183 mg/dL — ABNORMAL HIGH (ref 70–99)
Potassium: 5 mmol/L (ref 3.5–5.1)
Potassium: 5 mmol/L (ref 3.5–5.1)
Sodium: 135 mmol/L (ref 135–145)
Sodium: 135 mmol/L (ref 135–145)
Total Bilirubin: 1.9 mg/dL — ABNORMAL HIGH (ref 0.3–1.2)
Total Bilirubin: 1.9 mg/dL — ABNORMAL HIGH (ref 0.3–1.2)
Total Protein: 6.8 g/dL (ref 6.5–8.1)
Total Protein: 6.8 g/dL (ref 6.5–8.1)

## 2019-04-09 LAB — GLUCOSE, CAPILLARY
Glucose-Capillary: 125 mg/dL — ABNORMAL HIGH (ref 70–99)
Glucose-Capillary: 183 mg/dL — ABNORMAL HIGH (ref 70–99)
Glucose-Capillary: 165 mg/dL — ABNORMAL HIGH (ref 70–99)
Glucose-Capillary: 175 mg/dL — ABNORMAL HIGH (ref 70–99)

## 2019-04-09 LAB — C-REACTIVE PROTEIN: CRP: 13.4 mg/dL — ABNORMAL HIGH (ref <1.0)

## 2019-04-09 LAB — D-DIMER, QUANTITATIVE: D-Dimer, Quant: 2.4 ug/mL-FEU — ABNORMAL HIGH (ref 0.00–0.50)

## 2019-04-09 LAB — FERRITIN: Ferritin: 1424 ng/mL — ABNORMAL HIGH (ref 24–336)

## 2019-04-09 MED ORDER — METOPROLOL TARTRATE 25 MG PO TABS
25.0000 mg | ORAL_TABLET | Freq: Once | ORAL | Status: AC
  Administered 2019-04-09: 25 mg via ORAL
  Filled 2019-04-09: qty 1

## 2019-04-09 MED ORDER — METOPROLOL TARTRATE 50 MG PO TABS
100.0000 mg | ORAL_TABLET | Freq: Two times a day (BID) | ORAL | Status: DC
  Administered 2019-04-09 – 2019-04-10 (×2): 100 mg via ORAL
  Filled 2019-04-09 (×2): qty 2

## 2019-04-09 NOTE — Progress Notes (Signed)
2048 Paged and Dr. Dwana Curd concerning patient persistent Afib with rate 130-150 despite 2.5mg  of Lopressor IV given an hour ago.  2115 Page returned, new order received for additional 2.5mg  of lopressor IV.

## 2019-04-09 NOTE — Progress Notes (Signed)
PROGRESS NOTE    Jonathon Stevens  ZTI:[id number] DOB: 10-11-1945 DOA: 03/30/2019 PCP: Stephens Shire, MD   Brief Narrative:  75 y.o.malewith medical history significant ofPAF on xarelto, prior stroke, prediabetes. Patient presented to ED with c/o generalized weakness, malaise, mild diarrhea. No N/V, no SOB nor cough. No known COVID exposures.  Assessment & Plan:   Principal Problem:   Atrial fibrillation with RVR (HCC) Active Problems:   Prediabetes   Paroxysmal atrial fibrillation (HCC)   Anticoagulated   COVID-19 virus infection  Acute Hypoxic Respiratory Failure 2/2 COVID 19 Pneumonia S/p remdesivir, continue dexamethasone S/p ceftriaxone x 5 days S/p actemra on 1/6 Improving, currently on RA, though CRP has bumped today, unclear significance, will continue steroids and continue to monitor another day I/O, daily weights IS, prone as able, OOB  COVID-19 Labs  Recent Labs    04/09/19 1120  DDIMER 2.40*  FERRITIN 1,424*  CRP 13.4*    No results found for: [id number]  Atrial Fibrillation with RVR: increase metop to 100 mg BID, follow response.  Continue xarelto.  Acute Metabolic Encephalopathy: With visual hallucinations here CT head without acute findings Likely related to acute illness, hospitalization, and steroids Continue seroquel for now until discharge  Transaminitis Due to COVID-19.  Has improved.  Will need follow-up in the outpatient setting.  Hyponatremia Improved.  Continue to monitor..  Diabetes HbA1c 6.6.  Elevated CBGs most likely due to steroids.  Patient was started on Lantus.  SSI being continued. Needs outpatient follow up  Gout Continue allopurinol.  History of stroke On anticoagulation for stroke prevention.  PT and OT evaluation.  Home health recommended with supervision  Staph epidermidis UTI: likely contaminant  AKI: hold further diuretics  DVT prophylaxis: xarelto Code Status: full  Family Communication: discussed  with wife Disposition Plan: pneding further improvement  Consultants:   none  Procedures:   none  Antimicrobials:  Anti-infectives (From admission, onward)   Start     Dose/Rate Route Frequency Ordered Stop   04/02/19 1745  cefTRIAXone (ROCEPHIN) 1 g in sodium chloride 0.9 % 100 mL IVPB     1 g 200 mL/hr over 30 Minutes Intravenous Every 24 hours 04/02/19 1736 04/06/19 1827   04/01/19 1000  remdesivir 100 mg in sodium chloride 0.9 % 100 mL IVPB     100 mg 200 mL/hr over 30 Minutes Intravenous Daily 03/30/19 2352 04/04/19 1102   03/31/19 0030  remdesivir 200 mg in sodium chloride 0.9% 250 mL IVPB     200 mg 580 mL/hr over 30 Minutes Intravenous Once 03/30/19 2352 03/31/19 0241     Subjective: Feels ok No CP, SOB improved  Objective: Vitals:   04/09/19 0450 04/09/19 0834 04/09/19 0839 04/09/19 1114  BP: 110/77 (!) 112/99  121/67  Pulse: 78 (!) 49  (!) 51  Resp: 18 18  12   Temp: 97.7 F (36.5 C)  97.6 F (36.4 C)   TempSrc: Oral  Oral   SpO2: 94% 93%  93%  Weight:      Height:        Intake/Output Summary (Last 24 hours) at 04/09/2019 1824 Last data filed at 04/08/2019 1956 Gross per 24 hour  Intake -  Output 350 ml  Net -350 ml   Filed Weights   03/30/19 2101 04/01/19 2135  Weight: 83.9 kg 84.6 kg    Examination:  General exam: Appears calm and comfortable  Respiratory system: Clear to auscultation. Respiratory effort normal. Cardiovascular system: S1 & S2 heard, tachycardic, irregular  Gastrointestinal system: Abdomen is nondistended, soft and nontender.  Central nervous system: Alert and oriented. No focal neurological deficits. Extremities: no LEE Skin: No rashes, lesions or ulcers Psychiatry: Judgement and insight appear normal. Mood & affect appropriate.     Data Reviewed: I have personally reviewed following labs and imaging studies  CBC: Recent Labs  Lab 04/04/19 0010 04/05/19 0454 04/06/19 0240 04/07/19 0439 04/08/19 0501 04/09/19 1120   WBC 6.6 11.6* 18.4* 17.8* 14.8* 14.6*  NEUTROABS 5.4 10.3* 16.0* 15.3* 12.0*  --   HGB 15.5 14.8 16.0 15.7 16.7 17.5*  HCT 43.9 42.3 45.3 44.2 48.4 51.6  MCV 87.6 86.2 86.9 86.8 88.3 89.0  PLT 127* 170 201 230 261 251   Basic Metabolic Panel: Recent Labs  Lab 04/05/19 0454 04/06/19 0240 04/07/19 0439 04/08/19 0501 04/09/19 1120  NA 133* 136 137 136 135  135  K 3.7 4.1 3.8 5.0 5.0  5.0  CL 98 98 96* 96* 99  99  CO2 24 25 23 27 25  25   GLUCOSE 197* 179* 162* 135* 183*  183*  BUN 33* 36* 40* 38* 40*  40*  CREATININE 1.16 1.26* 1.23 1.35* 1.61*  1.61*  CALCIUM 8.3* 8.7* 8.4* 8.9 9.0  9.0   GFR: Estimated Creatinine Clearance: 43.5 mL/min ([name]) (by C-G formula based on SCr of 1.61 mg/dL (H)). Liver Function Tests: Recent Labs  Lab 04/05/19 0454 04/06/19 0240 04/07/19 0439 04/08/19 0501 04/09/19 1120  AST 67* 62* 63* 67* 48*  48*  ALT 66* 76* 81* 70* 67*  67*  ALKPHOS 59 70 68 69 66  66  BILITOT 1.0 0.9 1.3* 2.0* 1.9*  1.9*  PROT 5.9* 6.4* 6.4* 6.7 6.8  6.8  ALBUMIN 3.2* 3.5 3.6 3.6 3.8  3.8   No results for input(s): LIPASE, AMYLASE in the last 168 hours. No results for input(s): AMMONIA in the last 168 hours. Coagulation Profile: No results for input(s): INR, PROTIME in the last 168 hours. Cardiac Enzymes: No results for input(s): CKTOTAL, CKMB, CKMBINDEX, TROPONINI in the last 168 hours. BNP (last 3 results) No results for input(s): PROBNP in the last 8760 hours. HbA1C: No results for input(s): HGBA1C in the last 72 hours. CBG: Recent Labs  Lab 04/08/19 1724 04/08/19 2208 04/09/19 0752 04/09/19 1157 04/09/19 1623  GLUCAP 175* 204* 125* 183* 165*   Lipid Profile: No results for input(s): CHOL, HDL, LDLCALC, TRIG, CHOLHDL, LDLDIRECT in the last 72 hours. Thyroid Function Tests: No results for input(s): TSH, T4TOTAL, FREET4, T3FREE, THYROIDAB in the last 72 hours. Anemia Panel: Recent Labs    04/09/19 1120  FERRITIN 1,424*   Sepsis Labs:  Recent Labs  Lab 04/03/19 0202 04/04/19 0010 04/05/19 0454  PROCALCITON 0.80 0.44 0.18    Recent Results (from the past 240 hour(s))  Blood Culture (routine x 2)     Status: None   Collection Time: 03/30/19  9:18 PM   Specimen: BLOOD LEFT FOREARM  Result Value Ref Range Status   Specimen Description   Final    BLOOD LEFT FOREARM Performed at [facility], [address] [place], [place]    Special Requests   Final    BOTTLES DRAWN AEROBIC AND ANAEROBIC Blood Culture adequate volume Performed at [facility], [address], [place], [place]    Culture   Final    NO GROWTH 5 DAYS Performed at [facility], [address], [place], [place]  Report Status 04/05/2019 FINAL  Final  Blood Culture (routine x 2)     Status: None   Collection Time: 03/30/19  9:46 PM   Specimen: BLOOD  Result Value Ref Range Status   Specimen Description   Final    BLOOD RIGHT ANTECUBITAL Performed at [facility], [address], [place], [place] [address]    Special Requests   Final    BOTTLES DRAWN AEROBIC AND ANAEROBIC Blood Culture adequate volume Performed at [facility], [address], [place], [place] [address]    Culture   Final    NO GROWTH 5 DAYS Performed at [facility], [address] [place], [place] [address]    Report Status 04/05/2019 FINAL  Final  Urine culture     Status: Abnormal   Collection Time: 03/31/19  5:30 AM   Specimen: In/Out Cath Urine  Result Value Ref Range Status   Specimen Description   Final    IN/OUT CATH URINE Performed at [facility], [address] [place], [place] [address]    Special Requests   Final    NONE Performed at [facility], [address], [place], [place] [address]    Culture 300 COLONIES/mL STAPHYLOCOCCUS EPIDERMIDIS (Brandolyn Shortridge)  Final   Report Status 04/03/2019 FINAL  Final   Organism ID,  Bacteria STAPHYLOCOCCUS EPIDERMIDIS (Keean Wilmeth)  Final      Susceptibility   Staphylococcus epidermidis - MIC*    CIPROFLOXACIN <=0.5 SENSITIVE Sensitive     GENTAMICIN <=0.5 SENSITIVE Sensitive     NITROFURANTOIN <=16 SENSITIVE Sensitive     OXACILLIN <=0.25 SENSITIVE Sensitive     TETRACYCLINE <=1 SENSITIVE Sensitive     VANCOMYCIN 1 SENSITIVE Sensitive     TRIMETH/SULFA <=10 SENSITIVE Sensitive     CLINDAMYCIN <=0.25 SENSITIVE Sensitive     RIFAMPIN <=0.5 SENSITIVE Sensitive     Inducible Clindamycin NEGATIVE Sensitive     * 300 COLONIES/mL STAPHYLOCOCCUS EPIDERMIDIS  MRSA PCR Screening     Status: None   Collection Time: 04/01/19  9:30 PM   Specimen: Nasopharyngeal  Result Value Ref Range Status   MRSA by PCR NEGATIVE NEGATIVE Final    Comment:        The GeneXpert MRSA Assay (FDA approved for NASAL specimens only), is one component of Carsyn Boster comprehensive MRSA colonization surveillance program. It is not intended to diagnose MRSA infection nor to guide or monitor treatment for MRSA infections. Performed at [facility], [address] [place], [place] [address]          Radiology Studies: No results found.      Scheduled Meds: . allopurinol  300 mg Oral Daily  . vitamin C  500 mg Oral Daily  . atorvastatin  10 mg Oral q1800  . Chlorhexidine Gluconate Cloth  6 each Topical Q0600  . dexamethasone  4 mg Oral Daily  . famotidine  20 mg Oral Daily  . insulin aspart  0-15 Units Subcutaneous TID WC  . insulin aspart  0-5 Units Subcutaneous QHS  . insulin glargine  8 Units Subcutaneous Daily  . mouth rinse  15 mL Mouth Rinse BID  . metoprolol tartrate  100 mg Oral BID  . multivitamin with minerals  1 tablet Oral Daily  . QUEtiapine  25 mg Oral BID  . rivaroxaban  20 mg Oral Daily  . zinc sulfate  220 mg Oral Daily   Continuous Infusions:   LOS: 9 days  Time spent: over 30 min    Lacretia Nicks, MD [facility] Pager AMION  If  7PM-7AM, please contact night-coverage [email or web address] Password [facility] 04/09/2019, 6:24 PM

## 2019-04-09 NOTE — Progress Notes (Signed)
Paged and notified Dr. Dwana Curd about patient having 3.4 second pause while in afib with rate 60-80, no new orders, will continue to monitor.

## 2019-04-09 NOTE — Progress Notes (Signed)
Patient was unable to walk with PT due to HR. Increased dose of metoprolol was given. Will attempt to walk patient again.   Called wife, she is aware that he may be coming home today if he is able to maintain his 02 sats and his heart rate is controlled with the increased dose of Metoprolol.

## 2019-04-09 NOTE — Progress Notes (Signed)
SATURATION QUALIFICATIONS: (This note is used to comply with regulatory documentation for home oxygen)  Patient Saturations on Room Air at Rest = 94%  Patient Saturations on Room Air while Ambulating = 89-92%  Patient Saturations on NA Liters of oxygen while Ambulating = NA%  Please briefly explain why patient needs home oxygen: Pt does not require supplemental oxygen at rest or during ambulation.

## 2019-04-09 NOTE — TOC Progression Note (Signed)
Transition of Care [facility]) - Progression Note    Patient Details  Name: Jonathon Stevens MRN: [id number] Date of Birth: 03/19/1946  [facility] CM/SW Contact  Elliot Gault, LCSW Phone Number: 04/09/2019, 4:18 PM  Clinical Narrative:     TOC following. MD anticipating dc home tomorrow. Spoke with pt today to discuss [facility] recommendations. Pt stated he didn't know if he wanted HH or not. Asked TOC to call back tomorrow to ask. He did say that he would accept a wheeled walker at dc but didn't want the 3 in 1.   TOC will follow up tomorrow.   Expected Discharge Plan: Home w Home Health Services Barriers to Discharge: Continued Medical Work up  Expected Discharge Plan and Services Expected Discharge Plan: Home w Home Health Services In-house Referral: Clinical Social Work Discharge Planning Services: CM Consult Post Acute Care Choice: Home Health Living arrangements for the past 2 months: Single Family Home                 DME Arranged: 3-N-1, Walker rolling         HH Arranged: Patient Refused HH           Social Determinants of Health (SDOH) Interventions    Readmission Risk Interventions No flowsheet data found.

## 2019-04-09 NOTE — Progress Notes (Signed)
Attempted to ambulate pt. Afib in the high 130's/low 140's upon standing. Informed RN. Will round back to pt later after meds have been given.  Cristopher Peru, M.S., ACSM CEP

## 2019-04-10 LAB — MAGNESIUM: Magnesium: 2.6 mg/dL — ABNORMAL HIGH (ref 1.7–2.4)

## 2019-04-10 LAB — CBC WITH DIFFERENTIAL/PLATELET
Abs Immature Granulocytes: 0.19 10*3/uL — ABNORMAL HIGH (ref 0.00–0.07)
Basophils Absolute: 0.1 10*3/uL (ref 0.0–0.1)
Basophils Relative: 0 %
Eosinophils Absolute: 0.1 10*3/uL (ref 0.0–0.5)
Eosinophils Relative: 1 %
HCT: 49.9 % (ref 39.0–52.0)
Hemoglobin: 17.1 g/dL — ABNORMAL HIGH (ref 13.0–17.0)
Immature Granulocytes: 1 %
Lymphocytes Relative: 7 %
Lymphs Abs: 1 10*3/uL (ref 0.7–4.0)
MCH: 30.6 pg (ref 26.0–34.0)
MCHC: 34.3 g/dL (ref 30.0–36.0)
MCV: 89.3 fL (ref 80.0–100.0)
Monocytes Absolute: 0.7 10*3/uL (ref 0.1–1.0)
Monocytes Relative: 5 %
Neutro Abs: 11.9 10*3/uL — ABNORMAL HIGH (ref 1.7–7.7)
Neutrophils Relative %: 86 %
Platelets: 279 10*3/uL (ref 150–400)
RBC: 5.59 MIL/uL (ref 4.22–5.81)
RDW: 13.7 % (ref 11.5–15.5)
WBC: 13.9 10*3/uL — ABNORMAL HIGH (ref 4.0–10.5)
nRBC: 0 % (ref 0.0–0.2)

## 2019-04-10 LAB — COMPREHENSIVE METABOLIC PANEL
ALT: 66 U/L — ABNORMAL HIGH (ref 0–44)
AST: 41 U/L (ref 15–41)
Albumin: 3.6 g/dL (ref 3.5–5.0)
Alkaline Phosphatase: 64 U/L (ref 38–126)
Anion gap: 11 (ref 5–15)
BUN: 40 mg/dL — ABNORMAL HIGH (ref 8–23)
CO2: 27 mmol/L (ref 22–32)
Calcium: 8.9 mg/dL (ref 8.9–10.3)
Chloride: 99 mmol/L (ref 98–111)
Creatinine, Ser: 1.33 mg/dL — ABNORMAL HIGH (ref 0.61–1.24)
GFR calc Af Amer: >60 mL/min (ref >60)
GFR calc non Af Amer: 53 mL/min — ABNORMAL LOW (ref >60)
Glucose, Bld: 126 mg/dL — ABNORMAL HIGH (ref 70–99)
Potassium: 4.7 mmol/L (ref 3.5–5.1)
Sodium: 137 mmol/L (ref 135–145)
Total Bilirubin: 1.8 mg/dL — ABNORMAL HIGH (ref 0.3–1.2)
Total Protein: 6.8 g/dL (ref 6.5–8.1)

## 2019-04-10 LAB — GLUCOSE, CAPILLARY
Glucose-Capillary: 129 mg/dL — ABNORMAL HIGH (ref 70–99)
Glucose-Capillary: 221 mg/dL — ABNORMAL HIGH (ref 70–99)

## 2019-04-10 LAB — C-REACTIVE PROTEIN: CRP: 0.5 mg/dL (ref <1.0)

## 2019-04-10 LAB — D-DIMER, QUANTITATIVE: D-Dimer, Quant: 2.1 ug/mL-FEU — ABNORMAL HIGH (ref 0.00–0.50)

## 2019-04-10 LAB — FERRITIN: Ferritin: 1326 ng/mL — ABNORMAL HIGH (ref 24–336)

## 2019-04-10 LAB — PHOSPHORUS: Phosphorus: 4.7 mg/dL — ABNORMAL HIGH (ref 2.5–4.6)

## 2019-04-10 MED ORDER — BLOOD GLUCOSE MONITOR KIT: PACK | 0 refills | Status: AC

## 2019-04-10 MED ORDER — METOPROLOL TARTRATE 100 MG PO TABS: 100.0000 mg | ORAL_TABLET | Freq: Two times a day (BID) | ORAL | 0 refills | Status: DC

## 2019-04-10 NOTE — TOC Transition Note (Signed)
Transition of Care [facility]) - CM/SW Discharge Note   Patient Details  Name: Jonathon Stevens MRN: [id number] Date of Birth: [birth year]  Transition of Care [facility] CM/SW Contact:  Elliot Gault, LCSW Phone Number: 04/10/2019, 10:53 AM   Clinical Narrative:     Pt stable for dc per MD. Sherron Monday with pt by phone. He remains agreeable to walker but states that he does not want the [facility] Reviewed recommendations with pt and he continues to decline. Updated RN and MD.  [facility] will bring walker to pt's room. TOC will send paperwork to [employer].  There are no other TOC needs for dc.   Final next level of care: Home/Self Care Barriers to Discharge: Barriers Resolved   Patient Goals and CMS Choice Patient states their goals for this hospitalization and ongoing recovery are:: feeling real weak CMS Medicare.gov Compare Post Acute Care list provided to:: Patient Choice offered to / list presented to : Patient  Discharge Placement                       Discharge Plan and Services In-house Referral: Clinical Social Work Discharge Planning Services: CM Consult Post Acute Care Choice: Durable Medical Equipment          DME Arranged: Dan Humphreys rolling DME Agency: [employer] Date DME Agency Contacted: 04/10/19 Time DME Agency Contacted: 1053 Representative spoke with at DME Agency: Zack HH Arranged: Patient Refused HH          Social Determinants of Health (SDOH) Interventions     Readmission Risk Interventions Readmission Risk Prevention Plan 04/10/2019  Transportation Screening Complete  Home Care Screening Patient refused  Medication Review (RN CM) Complete  Some recent data might be hidden

## 2019-04-10 NOTE — Plan of Care (Signed)
BP 117/78 (BP Location: Left Arm)   Pulse (!) 54   Temp 97.6 F (36.4 C) (Oral)   Resp 14   Ht 5\' 11"  (1.803 m)   Wt 84.6 kg   SpO2 93%   BMI 26.01 kg/m   POC reviewed with patient, self repositions in chair.

## 2019-04-10 NOTE — Plan of Care (Signed)
  Problem: Health Behavior/Discharge Planning: Goal: Ability to manage health-related needs will improve Outcome: Progressing   Problem: Clinical Measurements: Goal: Ability to maintain clinical measurements within normal limits will improve Outcome: Progressing   Problem: Elimination: Goal: Will not experience complications related to bowel motility Outcome: Progressing Goal: Will not experience complications related to urinary retention Outcome: Progressing   Problem: Safety: Goal: Ability to remain free from injury will improve Outcome: Progressing   Problem: Education: Goal: Knowledge of General Education information will improve Description: Including pain rating scale, medication(s)/side effects and non-pharmacologic comfort measures Outcome: Progressing   Problem: Health Behavior/Discharge Planning: Goal: Ability to manage health-related needs will improve Outcome: Progressing   Problem: Clinical Measurements: Goal: Ability to maintain clinical measurements within normal limits will improve Outcome: Progressing Goal: Will remain free from infection Outcome: Progressing Goal: Diagnostic test results will improve Outcome: Progressing Goal: Respiratory complications will improve Outcome: Progressing Goal: Cardiovascular complication will be avoided Outcome: Progressing   Problem: Activity: Goal: Risk for activity intolerance will decrease Outcome: Progressing   Problem: Nutrition: Goal: Adequate nutrition will be maintained Outcome: Progressing   Problem: Coping: Goal: Level of anxiety will decrease Outcome: Progressing   Problem: Elimination: Goal: Will not experience complications related to bowel motility Outcome: Progressing Goal: Will not experience complications related to urinary retention Outcome: Progressing   Problem: Pain Managment: Goal: General experience of comfort will improve Outcome: Progressing   Problem: Safety: Goal: Ability to  remain free from injury will improve Outcome: Progressing   Problem: Skin Integrity: Goal: Risk for impaired skin integrity will decrease Outcome: Progressing

## 2019-04-10 NOTE — Discharge Summary (Signed)
Physician Discharge Summary  Jonathon Stevens WFU:[id number] DOB: May 04, 1945 DOA: 03/30/2019  PCP: Jonathon Shire, MD  Admit date: 03/30/2019 Discharge date: 04/10/2019  Time spent: 40 minutes  Recommendations for Outpatient Follow-up:  1. Follow outpatient CBC/CMP 2. Follow CXR outpatient 3. Follow HR outpatient (metop increased to 100 mg twice daily) 4. Follow CDC quarantine guidelines 5. Follow new diagnosis of diabetes outpatient   Discharge Diagnoses:  Principal Problem:   Atrial fibrillation with RVR ([place]) Active Problems:   Prediabetes   Paroxysmal atrial fibrillation ([place])   Anticoagulated   COVID-19 virus infection   Discharge Condition: stable   Filed Weights   03/30/19 2101 04/01/19 2135 04/10/19 0418  Weight: 83.9 kg 84.6 kg 83.2 kg    History of present illness:  74 y.o.malewith medical history significant ofPAF on xarelto, prior stroke, prediabetes. Patient presented to ED with c/o generalized weakness, malaise, mild diarrhea. No N/V, no SOB nor cough. No known COVID exposures.  He was admitted for COVID pneumonia.  He's improved with steroids, remdesivir, and actemra as well as antibiotics.  Metoprolol was uptitrated for RVR during hospitalization.  Doing well on RA at discharge.  See below  Hospital Course:  Acute Hypoxic Respiratory Failure 2/2 COVID 19 Pneumonia S/p remdesivir, dex S/p ceftriaxone x 5 days S/p actemra on 1/6 CRP improved today I/O, daily weights IS, prone as able, OOB  COVID-19 Labs  Recent Labs    04/09/19 1120 04/10/19 0311  DDIMER 2.40* 2.10*  FERRITIN 1,424* 1,326*  CRP 13.4* 0.5    No results found for: SARSCOV2NAA  Atrial Fibrillation with RVR: increase metop to 100 mg BID, follow response.  Continue xarelto.  Continue, follow outpatient.    Acute Metabolic Encephalopathy: With visual hallucinations here CT head without acute findings Likely related to acute illness, hospitalization, and steroids Continue  seroquel for now until discharge Improved  Transaminitis Due to COVID-19.Has improved.Will need follow-up in the outpatient setting.  Hyponatremia Improved. Continue to monitor..  Diabetes HbA1c 6.6. Elevated CBGs most likely due to steroids. Patient was started on Lantus. SSI being continued. Needs outpatient follow up for diabetes.  Gout Continue allopurinol.  History of stroke On anticoagulation for stroke prevention. PT and OT evaluation. Home health recommended with supervision  Staph epidermidis UTI: likely contaminant  AKI: hold further diuretics  Procedures:  none  Consultations:  none  Discharge Exam: Vitals:   04/10/19 0418 04/10/19 0758  BP: 115/90 119/71  Pulse: 71 88  Resp: 14 15  Temp: 97.8 F (36.6 C) (!) 97.4 F (36.3 C)  SpO2: 97% 95%   Feels well Excited to d/c home Discussed d/c plan with wife Pt declined home health  General: No acute distress. Cardiovascular: Heart sounds show Jonathon Stevens irregular rhythm, rate in low 100's, up to 120's with activity Lungs: Clear to auscultation bilaterally  Abdomen: Soft, nontender, nondistended  Neurological: Alert and oriented 3. Moves all extremities 4. Cranial nerves II through XII grossly intact. Skin: Warm and dry. No rashes or lesions. Extremities: No clubbing or cyanosis. No edema.   Discharge Instructions   Discharge Instructions    Diet - low sodium heart healthy   Complete by: As directed    Discharge instructions   Complete by: As directed    You were seen for COVID 19 pneumonia.  You've improved with steroids, remdesivir, and actemra.   You will need to continue to isolate until January 24th.  Your isolation can be discontinued after this date if you don't have any additional  symptoms without this use of medications.  Call the health department or your PCP with questions.  We increased your metoprolol to 100 mg twice daily.  Watch for light headedness or dizziness or other  new or concerning symptoms with this new blood pressure medication.  Please follow up with Dr. Irish Stevens about this medication once your quarantine is complete.  You have diabetes, please follow up with your primary care provider regarding this.  You may need medications.  Return for new, recurrent, or worsening symptoms.  Please ask your PCP to request records from this hospitalization so they know what was done and what the next steps will be.   Increase activity slowly   Complete by: As directed    MyChart COVID-19 home monitoring program   Complete by: Apr 10, 2019    Is the patient willing to use the [place] for home monitoring?: Yes   Temperature monitoring   Complete by: Apr 10, 2019    After how many days would you like to receive Jonathon Stevens notification of this patient's flowsheet entries?: 1     Allergies as of 04/10/2019      Reactions   Indomethacin Rash      Medication List    TAKE these medications   allopurinol 300 MG tablet Commonly known as: ZYLOPRIM Take 300 mg by mouth daily after breakfast.   atorvastatin 10 MG tablet Commonly known as: LIPITOR TAKE 1 TABLET BY MOUTH DAILY AT 6PM What changed:   how much to take  how to take this  when to take this  additional instructions   blood glucose meter kit and supplies Kit Dispense based on patient and insurance preference. Use up to four times daily as directed. (FOR ICD-9 250.00, 250.01).   COENZYME Q10 PO Take 1 tablet by mouth daily.   FISH OIL PO Take 2 tablets by mouth daily.   GLUCOSAMINE-CHONDROITIN PO Take 1 tablet by mouth daily.   metoprolol tartrate 100 MG tablet Commonly known as: LOPRESSOR Take 1 tablet (100 mg total) by mouth 2 (two) times daily. What changed:   medication strength  how much to take  how to take this  when to take this  additional instructions   multivitamin tablet Take 1 tablet by mouth daily.   rivaroxaban 20 MG Tabs tablet Commonly known as:  Xarelto Take 1 tablet (20 mg total) by mouth daily.            Durable Medical Equipment  (From admission, onward)         Start     Ordered   04/01/19 1546  For home use only DME Walker rolling  Once    Question Answer Comment  Walker: With 5 Inch Wheels   Patient needs Jonathon Stevens walker to treat with the following condition COVID-19 virus infection      04/01/19 1546   04/01/19 1545  For home use only DME 3 n 1  Once     04/01/19 1546         Allergies  Allergen Reactions  . Indomethacin Rash      The results of significant diagnostics from this hospitalization (including imaging, microbiology, ancillary and laboratory) are listed below for reference.    Significant Diagnostic Studies: DG Chest 1 View  Result Date: 04/02/2019 CLINICAL DATA:  COVID positive, former smoker EXAM: CHEST  1 VIEW COMPARISON:  Radiograph 03/30/2019 FINDINGS: Normal mediastinum and cardiac silhouette. There is peripheral fine airspace opacities LEFT and RIGHT lung which  are increased slightly from comparison exam. No pleural fluid. No pneumothorax. No acute osseous abnormality. IMPRESSION: Increasing fine peripheral airspace disease. Findings are consistent with mild worsening of COVID pneumonia. Electronically Signed   By: Suzy Bouchard M.D.   On: 04/02/2019 14:12   CT HEAD WO CONTRAST  Result Date: 04/01/2019 CLINICAL DATA:  Status post trauma. EXAM: CT HEAD WITHOUT CONTRAST TECHNIQUE: Contiguous axial images were obtained from the base of the skull through the vertex without intravenous contrast. COMPARISON:  October 07, 2013 FINDINGS: Brain: There is mild cerebral atrophy with widening of the extra-axial spaces and ventricular dilatation. There are areas of decreased attenuation within the white matter tracts of the supratentorial brain, consistent with microvascular disease changes. Vascular: No hyperdense vessel or unexpected calcification. Skull: Normal. Negative for fracture or focal lesion.  Sinuses/Orbits: There is moderate severity bilateral ethmoid sinus mucosal thickening, left slightly greater than right. Other: None. IMPRESSION: 1. No acute intracranial abnormality. 2. 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 3. Moderate severity bilateral ethmoid sinus disease. Electronically Signed   By: Virgina Norfolk M.D.   On: 04/01/2019 21:18   PCXR COVID AM  Result Date: 04/06/2019 CLINICAL DATA:  COVID-19 positive, shortness of breath EXAM: PORTABLE CHEST 1 VIEW COMPARISON:  Chest radiograph dated 04/02/2019 FINDINGS: The heart size is normal. Vascular calcifications are seen in the aortic arch. Mild peripheral predominant interstitial and airspace opacities appear slightly decreased since 04/02/2019 given differences in technique. No pleural effusion or pneumothorax is noted. The osseous structures are intact. IMPRESSION: 1. Mild peripheral predominant interstitial and airspace opacities appear slightly decreased since 04/01/18. Electronically Signed   By: Zerita Boers M.D.   On: 04/06/2019 09:29   DG Chest Port 1 View  Result Date: 03/30/2019 CLINICAL DATA:  Initial evaluation for acute fever, weakness. EXAM: PORTABLE CHEST 1 VIEW COMPARISON:  Prior radiograph from 08/27/2017. FINDINGS: Transverse heart size within normal limits. Mediastinal silhouette normal. Aortic atherosclerosis. Lungs normally inflated. Diffuse interstitial prominence seen throughout the lungs bilaterally, which could reflect mild interstitial congestion or possibly atypical/viral pneumonitis. No consolidative opacity to suggest bronchopneumonia. No pleural effusion. No pneumothorax. No acute osseous finding. IMPRESSION: 1. Diffuse interstitial prominence throughout the lungs bilaterally, which could reflect mild interstitial congestion or possibly atypical/viral pneumonitis. No consolidative opacity to suggest bronchopneumonia. 2.  Aortic Atherosclerosis (ICD10-I70.0).  Electronically Signed   By: Jeannine Boga M.D.   On: 03/30/2019 21:39    Microbiology: Recent Results (from the past 240 hour(s))  MRSA PCR Screening     Status: None   Collection Time: 04/01/19  9:30 PM   Specimen: Nasopharyngeal  Result Value Ref Range Status   MRSA by PCR NEGATIVE NEGATIVE Final    Comment:        The GeneXpert MRSA Assay (FDA approved for NASAL specimens only), is one component of Alica Shellhammer comprehensive MRSA colonization surveillance program. It is not intended to diagnose MRSA infection nor to guide or monitor treatment for MRSA infections. Performed at [facility], [place] [address], [place], [place] [address]      Labs: Basic Metabolic Panel: Recent Labs  Lab 04/06/19 0240 04/07/19 0439 04/08/19 0501 04/09/19 1120 04/10/19 0311  NA 136 137 136 135  135 137  K 4.1 3.8 5.0 5.0  5.0 4.7  CL 98 96* 96* 99  99 99  CO2 _0 GLUCOSE 179* 162* 135* 183*  183* 126*  BUN 36* 40* 38* 40*  40* 40*  CREATININE 1.26* 1.23 1.35* 1.61*  1.61* 1.33*  CALCIUM 8.7* 8.4* 8.9 9.0  9.0 8.9  MG  --   --   --   --  2.6*  PHOS  --   --   --   --  4.7*   Liver Function Tests: Recent Labs  Lab 04/06/19 0240 04/07/19 0439 04/08/19 0501 04/09/19 1120 04/10/19 0311  AST 62* 63* 67* 48*  48* 41  ALT 76* 81* 70* 67*  67* 66*  ALKPHOS 70 68 69 66  66 64  BILITOT 0.9 1.3* 2.0* 1.9*  1.9* 1.8*  PROT 6.4* 6.4* 6.7 6.8  6.8 6.8  ALBUMIN 3.5 3.6 3.6 3.8  3.8 3.6   No results for input(s): LIPASE, AMYLASE in the last 168 hours. No results for input(s): AMMONIA in the last 168 hours. CBC: Recent Labs  Lab 04/05/19 0454 04/05/19 0454 04/06/19 0240 04/07/19 0439 04/08/19 0501 04/09/19 1120 04/10/19 0311  WBC 11.6*   < > 18.4* 17.8* 14.8* 14.6* 13.9*  NEUTROABS 10.3*  --  16.0* 15.3* 12.0*  --  11.9*  HGB 14.8   < > 16.0 15.7 16.7 17.5* 17.1*  HCT 42.3   < > 45.3 44.2 48.4 51.6 49.9  MCV 86.2   < > 86.9 86.8 88.3  89.0 89.3  PLT 170   < > 201 230 261 251 279   < > = values in this interval not displayed.   Cardiac Enzymes: No results for input(s): CKTOTAL, CKMB, CKMBINDEX, TROPONINI in the last 168 hours. BNP: BNP (last 3 results) Recent Labs    03/30/19 2146 04/02/19 0224  BNP 143.2* 199.4*    ProBNP (last 3 results) No results for input(s): PROBNP in the last 8760 hours.  CBG: Recent Labs  Lab 04/09/19 0752 04/09/19 1157 04/09/19 1623 04/09/19 2007 04/10/19 0722  GLUCAP 125* 183* 165* 175* 129*       Signed:  Fayrene Helper MD.  [facility] 04/10/2019, 9:41 AM

## 2019-04-10 NOTE — Plan of Care (Signed)
  Problem: Health Behavior/Discharge Planning: Goal: Ability to manage health-related needs will improve 04/10/2019 1149 by Janan Halter, RN Outcome: Adequate for Discharge 04/10/2019 1149 by Janan Halter, RN Outcome: Progressing   Problem: Clinical Measurements: Goal: Ability to maintain clinical measurements within normal limits will improve 04/10/2019 1149 by Janan Halter, RN Outcome: Adequate for Discharge 04/10/2019 1149 by Janan Halter, RN Outcome: Progressing   Problem: Elimination: Goal: Will not experience complications related to bowel motility 04/10/2019 1149 by Janan Halter, RN Outcome: Adequate for Discharge 04/10/2019 1149 by Janan Halter, RN Outcome: Progressing Goal: Will not experience complications related to urinary retention 04/10/2019 1149 by Janan Halter, RN Outcome: Adequate for Discharge 04/10/2019 1149 by Janan Halter, RN Outcome: Progressing   Problem: Safety: Goal: Ability to remain free from injury will improve 04/10/2019 1149 by Janan Halter, RN Outcome: Adequate for Discharge 04/10/2019 1149 by Janan Halter, RN Outcome: Progressing   Problem: Education: Goal: Knowledge of General Education information will improve Description: Including pain rating scale, medication(s)/side effects and non-pharmacologic comfort measures 04/10/2019 1149 by Janan Halter, RN Outcome: Adequate for Discharge 04/10/2019 1149 by Janan Halter, RN Outcome: Progressing   Problem: Health Behavior/Discharge Planning: Goal: Ability to manage health-related needs will improve 04/10/2019 1149 by Janan Halter, RN Outcome: Adequate for Discharge 04/10/2019 1149 by Janan Halter, RN Outcome: Progressing   Problem: Clinical Measurements: Goal: Ability to maintain clinical measurements within normal limits will improve 04/10/2019 1149 by Janan Halter, RN Outcome: Adequate for Discharge 04/10/2019 1149 by Janan Halter, RN Outcome: Progressing Goal: Will remain free from  infection 04/10/2019 1149 by Janan Halter, RN Outcome: Adequate for Discharge 04/10/2019 1149 by Janan Halter, RN Outcome: Progressing Goal: Diagnostic test results will improve 04/10/2019 1149 by Janan Halter, RN Outcome: Adequate for Discharge 04/10/2019 1149 by Janan Halter, RN Outcome: Progressing Goal: Respiratory complications will improve 04/10/2019 1149 by Janan Halter, RN Outcome: Adequate for Discharge 04/10/2019 1149 by Janan Halter, RN Outcome: Progressing Goal: Cardiovascular complication will be avoided 04/10/2019 1149 by Janan Halter, RN Outcome: Adequate for Discharge 04/10/2019 1149 by Janan Halter, RN Outcome: Progressing   Problem: Activity: Goal: Risk for activity intolerance will decrease 04/10/2019 1149 by Janan Halter, RN Outcome: Adequate for Discharge 04/10/2019 1149 by Janan Halter, RN Outcome: Progressing   Problem: Nutrition: Goal: Adequate nutrition will be maintained 04/10/2019 1149 by Janan Halter, RN Outcome: Adequate for Discharge 04/10/2019 1149 by Janan Halter, RN Outcome: Progressing   Problem: Coping: Goal: Level of anxiety will decrease 04/10/2019 1149 by Janan Halter, RN Outcome: Adequate for Discharge 04/10/2019 1149 by Janan Halter, RN Outcome: Progressing   Problem: Elimination: Goal: Will not experience complications related to bowel motility 04/10/2019 1149 by Janan Halter, RN Outcome: Adequate for Discharge 04/10/2019 1149 by Janan Halter, RN Outcome: Progressing Goal: Will not experience complications related to urinary retention 04/10/2019 1149 by Janan Halter, RN Outcome: Adequate for Discharge 04/10/2019 1149 by Janan Halter, RN Outcome: Progressing   Problem: Pain Managment: Goal: General experience of comfort will improve 04/10/2019 1149 by Janan Halter, RN Outcome: Adequate for Discharge 04/10/2019 1149 by Janan Halter, RN Outcome: Progressing   Problem: Safety: Goal: Ability to remain free from injury  will improve 04/10/2019 1149 by Janan Halter, RN Outcome: Adequate for Discharge 04/10/2019 1149 by Janan Halter, RN Outcome: Progressing   Problem: Skin Integrity: Goal: Risk for impaired skin integrity will decrease 04/10/2019 1149 by Janan Halter, RN Outcome: Adequate for Discharge 04/10/2019 1149 by Janan Halter, RN Outcome: Progressing

## 2019-04-10 NOTE — Discharge Instructions (Signed)
You should quarantine until January 24th.  Call the health department or your PCP with questions.  Your quarantine can be discontinued after this date if you have no more symptoms without the use of medications.     Person Under Monitoring Name: Jonathon Stevens  Location: [address] [place] [address]   Infection Prevention Recommendations for Individuals Confirmed to have, or Being Evaluated for, 2019 Novel Coronavirus (COVID-19) Infection Who Receive Care at Home  Individuals who are confirmed to have, or are being evaluated for, COVID-19 should follow the prevention steps below until Jonathon Stevens healthcare provider or local or state health department says they can return to normal activities.  Stay home except to get medical care You should restrict activities outside your home, except for getting medical care. Do not go to work, school, or public areas, and do not use public transportation or taxis.  Call ahead before visiting your doctor Before your medical appointment, call the healthcare provider and tell them that you have, or are being evaluated for, COVID-19 infection. This will help the healthcare provider's office take steps to keep other people from getting infected. Ask your healthcare provider to call the local or state health department.  Monitor your symptoms Seek prompt medical attention if your illness is worsening (e.g., difficulty breathing). Before going to your medical appointment, call the healthcare provider and tell them that you have, or are being evaluated for, COVID-19 infection. Ask your healthcare provider to call the local or state health department.  Wear Jonathon Stevens facemask You should wear Jonathon Stevens facemask that covers your nose and mouth when you are in the same room with other people and when you visit Jonathon Stevens healthcare provider. People who live with or visit you should also wear Jonathon Stevens facemask while they are in the same room with you.  Separate yourself from other people in  your home As much as possible, you should stay in Jonathon Stevens different room from other people in your home. Also, you should use Jonathon Stevens separate bathroom, if available.  Avoid sharing household items You should not share dishes, drinking glasses, cups, eating utensils, towels, bedding, or other items with other people in your home. After using these items, you should wash them thoroughly with soap and water.  Cover your coughs and sneezes Cover your mouth and nose with Jonathon Stevens tissue when you cough or sneeze, or you can cough or sneeze into your sleeve. Throw used tissues in Jonathon Stevens lined trash can, and immediately wash your hands with soap and water for at least 20 seconds or use an alcohol-based hand rub.  Wash your Jonathon Stevens your hands often and thoroughly with soap and water for at least 20 seconds. You can use an alcohol-based hand sanitizer if soap and water are not available and if your hands are not visibly dirty. Avoid touching your eyes, nose, and mouth with unwashed hands.   Prevention Steps for Caregivers and Household Members of Individuals Confirmed to have, or Being Evaluated for, COVID-19 Infection Being Cared for in the Home  If you live with, or provide care at home for, Jonathon Stevens person confirmed to have, or being evaluated for, COVID-19 infection please follow these guidelines to prevent infection:  Follow healthcare provider's instructions Make sure that you understand and can help the patient follow any healthcare provider instructions for all care.  Provide for the patient's basic needs You should help the patient with basic needs in the home and provide support for getting groceries, prescriptions, and other personal needs.  Monitor the patient's symptoms If they are getting sicker, call his or her medical provider and tell them that the patient has, or is being evaluated for, COVID-19 infection. This will help the healthcare provider's office take steps to keep other people from getting  infected. Ask the healthcare provider to call the local or state health department.  Limit the number of people who have contact with the patient  If possible, have only one caregiver for the patient.  Other household members should stay in another home or place of residence. If this is not possible, they should stay  in another room, or be separated from the patient as much as possible. Use Jonathon Stevens separate bathroom, if available.  Restrict visitors who do not have an essential need to be in the home.  Keep older adults, very young children, and other sick people away from the patient Keep older adults, very young children, and those who have compromised immune systems or chronic health conditions away from the patient. This includes people with chronic heart, lung, or kidney conditions, diabetes, and cancer.  Ensure good ventilation Make sure that shared spaces in the home have good air flow, such as from an air conditioner or an opened window, weather permitting.  Wash your hands often  Wash your hands often and thoroughly with soap and water for at least 20 seconds. You can use an alcohol based hand sanitizer if soap and water are not available and if your hands are not visibly dirty.  Avoid touching your eyes, nose, and mouth with unwashed hands.  Use disposable paper towels to dry your hands. If not available, use dedicated cloth towels and replace them when they become wet.  Wear Jonathon Stevens facemask and gloves  Wear Jonathon Stevens disposable facemask at all times in the room and gloves when you touch or have contact with the patient's blood, body fluids, and/or secretions or excretions, such as sweat, saliva, sputum, nasal mucus, vomit, urine, or feces.  Ensure the mask fits over your nose and mouth tightly, and do not touch it during use.  Throw out disposable facemasks and gloves after using them. Do not reuse.  Wash your hands immediately after removing your facemask and gloves.  If your personal  clothing becomes contaminated, carefully remove clothing and launder. Wash your hands after handling contaminated clothing.  Place all used disposable facemasks, gloves, and other waste in Jamiee Milholland lined container before disposing them with other household waste.  Remove gloves and wash your hands immediately after handling these items.  Do not share dishes, glasses, or other household items with the patient  Avoid sharing household items. You should not share dishes, drinking glasses, cups, eating utensils, towels, bedding, or other items with Tayelor Osborne patient who is confirmed to have, or being evaluated for, COVID-19 infection.  After the person uses these items, you should wash them thoroughly with soap and water.  Wash laundry thoroughly  Immediately remove and wash clothes or bedding that have blood, body fluids, and/or secretions or excretions, such as sweat, saliva, sputum, nasal mucus, vomit, urine, or feces, on them.  Wear gloves when handling laundry from the patient.  Read and follow directions on labels of laundry or clothing items and detergent. In general, wash and dry with the warmest temperatures recommended on the label.  Clean all areas the individual has used often  Clean all touchable surfaces, such as counters, tabletops, doorknobs, bathroom fixtures, toilets, phones, keyboards, tablets, and bedside tables, every day. Also, clean any surfaces that  may have blood, body fluids, and/or secretions or excretions on them.  Wear gloves when cleaning surfaces the patient has come in contact with.  Use Lurline Caver diluted bleach solution (e.g., dilute bleach with 1 part bleach and 10 parts water) or Taniesha Glanz household disinfectant with Ximena Todaro label that says EPA-registered for coronaviruses. To make Karon Heckendorn bleach solution at home, add 1 tablespoon of bleach to 1 quart (4 cups) of water. For Varnika Butz larger supply, add  cup of bleach to 1 gallon (16 cups) of water.  Read labels of cleaning products and follow  recommendations provided on product labels. Labels contain instructions for safe and effective use of the cleaning product including precautions you should take when applying the product, such as wearing gloves or eye protection and making sure you have good ventilation during use of the product.  Remove gloves and wash hands immediately after cleaning.  Monitor yourself for signs and symptoms of illness Caregivers and household members are considered close contacts, should monitor their health, and will be asked to limit movement outside of the home to the extent possible. Follow the monitoring steps for close contacts listed on the symptom monitoring form.   ? If you have additional questions, contact your local health department or call the epidemiologist on call at [phone] (available 24/7). ? This guidance is subject to change. For the most up-to-date guidance from CDC, please refer to their website: TripMetro.hu  COVID-19 COVID-19 is Reeda Soohoo respiratory infection that is caused by Harveen Flesch virus called severe acute respiratory syndrome coronavirus 2 (SARS-CoV-2). The disease is also known as coronavirus disease or novel coronavirus. In some people, the virus may not cause any symptoms. In others, it may cause Wess Baney serious infection. The infection can get worse quickly and can lead to complications, such as:  Pneumonia, or infection of the lungs.  Acute respiratory distress syndrome or ARDS. This is Goran Olden condition in which fluid build-up in the lungs prevents the lungs from filling with air and passing oxygen into the blood.  Acute respiratory failure. This is Paxson Harrower condition in which there is not enough oxygen passing from the lungs to the body or when carbon dioxide is not passing from the lungs out of the body.  Sepsis or septic shock. This is Ladonte Verstraete serious bodily reaction to an infection.  Blood clotting problems.  Secondary infections due  to bacteria or fungus.  Organ failure. This is when your body's organs stop working. The virus that causes COVID-19 is contagious. This means that it can spread from person to person through droplets from coughs and sneezes (respiratory secretions). What are the causes? This illness is caused by Breanda Greenlaw virus. You may catch the virus by:  Breathing in droplets from an infected person. Droplets can be spread by Dohn Stclair person breathing, speaking, singing, coughing, or sneezing.  Touching something, like Latoy Labriola table or Canuto Kingston doorknob, that was exposed to the virus (contaminated) and then touching your mouth, nose, or eyes. What increases the risk? Risk for infection You are more likely to be infected with this virus if you:  Are within 6 feet (2 meters) of Sierra Bissonette person with COVID-19.  Provide care for or live with Amika Tassin person who is infected with COVID-19.  Spend time in crowded indoor spaces or live in shared housing. Risk for serious illness You are more likely to become seriously ill from the virus if you:  Are 35 years of age or older. The higher your age, the more you are at risk for serious illness.  Live in [facility] Eller nursing home or long-term care facility.  Have cancer.  Have Phelan Goers long-term (chronic) disease such as: ? Chronic lung disease, including chronic obstructive pulmonary disease or asthma. ? Lexiana Spindel long-term disease that lowers your body's ability to fight infection (immunocompromised). ? Heart disease, including heart failure, Dvora Buitron condition in which the arteries that lead to the heart become narrow or blocked (coronary artery disease), Masayo Fera disease which makes the heart muscle thick, weak, or stiff (cardiomyopathy). ? Diabetes. ? Chronic kidney disease. ? Sickle cell disease, Rylin Seavey condition in which red blood cells have an abnormal "sickle" shape. ? Liver disease.  Are obese. What are the signs or symptoms? Symptoms of this condition can range from mild to severe. Symptoms may appear any time from 2 to 14 days  after being exposed to the virus. They include:  Margaret Cockerill fever or chills.  Azaylea Maves cough.  Difficulty breathing.  Headaches, body aches, or muscle aches.  Runny or stuffy (congested) nose.  Yesha Muchow sore throat.  New loss of taste or smell. Some people may also have stomach problems, such as nausea, vomiting, or diarrhea. Other people may not have any symptoms of COVID-19. How is this diagnosed? This condition may be diagnosed based on:  Your signs and symptoms, especially if: ? You live in an area with Cristalle Rohm COVID-19 outbreak. ? You recently traveled to or from an area where the virus is common. ? You provide care for or live with Sera Hitsman person who was diagnosed with COVID-19. ? You were exposed to Brigid Vandekamp person who was diagnosed with COVID-19.  Tammye Kahler physical exam.  Lab tests, which may include: ? Taking Leigh Kaeding sample of fluid from the back of your nose and throat (nasopharyngeal fluid), your nose, or your throat using Shahidah Nesbitt swab. ? Renny Remer sample of mucus from your lungs (sputum). ? Blood tests.  Imaging tests, which may include, X-rays, CT scan, or ultrasound. How is this treated? At present, there is no medicine to treat COVID-19. Medicines that treat other diseases are being used on Korena Nass trial basis to see if they are effective against COVID-19. Your health care provider will talk with you about ways to treat your symptoms. For most people, the infection is mild and can be managed at home with rest, fluids, and over-the-counter medicines. Treatment for Dari Carpenito serious infection usually takes places in Terran Hollenkamp hospital intensive care unit (ICU). It may include one or more of the following treatments. These treatments are given until your symptoms improve.  Receiving fluids and medicines through an IV.  Supplemental oxygen. Extra oxygen is given through Jayne Peckenpaugh tube in the nose, Glynna Failla face mask, or Gari Trovato hood.  Positioning you to lie on your stomach (prone position). This makes it easier for oxygen to get into the lungs.  Continuous positive  airway pressure (CPAP) or bi-level positive airway pressure (BPAP) machine. This treatment uses mild air pressure to keep the airways open. Ercell Perlman tube that is connected to Beatrix Breece motor delivers oxygen to the body.  Ventilator. This treatment moves air into and out of the lungs by using Elianie Hubers tube that is placed in your windpipe.  Tracheostomy. This is Isaiha Asare procedure to create Akili Cuda hole in the neck so that Aleyna Cueva breathing tube can be inserted.  Extracorporeal membrane oxygenation (ECMO). This procedure gives the lungs Samanthan Dugo chance to recover by taking over the functions of the heart and lungs. It supplies oxygen to the body and removes carbon dioxide. Follow these instructions at home: Lifestyle  If you are sick, stay  home except to get medical care. Your health care provider will tell you how long to stay home. Call your health care provider before you go for medical care.  Rest at home as told by your health care provider.  Do not use any products that contain nicotine or tobacco, such as cigarettes, e-cigarettes, and chewing tobacco. If you need help quitting, ask your health care provider.  Return to your normal activities as told by your health care provider. Ask your health care provider what activities are safe for you. General instructions  Take over-the-counter and prescription medicines only as told by your health care provider.  Drink enough fluid to keep your urine pale yellow.  Keep all follow-up visits as told by your health care provider. This is important. How is this prevented?  There is no vaccine to help prevent COVID-19 infection. However, there are steps you can take to protect yourself and others from this virus. To protect yourself:   Do not travel to areas where COVID-19 is [place]afen risk. The areas where COVID-19 is reported change often. To identify high-risk areas and travel restrictions, check the CDC travel website: StageSync.si  If you live in, or must travel to, an area  where COVID-19 is Malaina Mortellaro risk, take precautions to avoid infection. ? Stay away from people who are sick. ? Wash your hands often with soap and water for 20 seconds. If soap and water are not available, use an alcohol-based hand sanitizer. ? Avoid touching your mouth, face, eyes, or nose. ? Avoid going out in public, follow guidance from your state and local health authorities. ? If you must go out in public, wear Azaria Stegman cloth face covering or face mask. Make sure your mask covers your nose and mouth. ? Avoid crowded indoor spaces. Stay at least 6 feet (2 meters) away from others. ? Disinfect objects and surfaces that are frequently touched every day. This may include:  Counters and tables.  Doorknobs and light switches.  Sinks and faucets.  Electronics, such as phones, remote controls, keyboards, computers, and tablets. To protect others: If you have symptoms of COVID-19, take steps to prevent the virus from spreading to others.  If you think you have Alexio Sroka COVID-19 infection, contact your health care provider right away. Tell your health care team that you think you may have Carolena Fairbank COVID-19 infection.  Stay home. Leave your house only to seek medical care. Do not use public transport.  Do not travel while you are sick.  Wash your hands often with soap and water for 20 seconds. If soap and water are not available, use alcohol-based hand sanitizer.  Stay away from other members of your household. Let healthy household members care for children and pets, if possible. If you have to care for children or pets, wash your hands often and wear Halia Franey mask. If possible, stay in your own room, separate from others. Use Reigna Ruperto different bathroom.  Make sure that all people in your household wash their hands well and often.  Cough or sneeze into Shereen Marton tissue or your sleeve or elbow. Do not cough or sneeze into your hand or into the air.  Wear Marky Buresh cloth face covering or face mask. Make sure your mask covers your nose and  mouth. Where to find more information  Centers for Disease Control and Prevention: StickerEmporium.tn  World Health Organization: https://thompson-craig.com/ Contact Sita Mangen health care provider if:  You live in or have traveled to an area where COVID-19 is Delmar Dondero risk and you  have symptoms of the infection.  You have had contact with someone who has COVID-19 and you have symptoms of the infection. Get help right away if:  You have trouble breathing.  You have pain or pressure in your chest.  You have confusion.  You have bluish lips and fingernails.  You have difficulty waking from sleep.  You have symptoms that get worse. These symptoms may represent Maximus Hoffert serious problem that is an emergency. Do not wait to see if the symptoms will go away. Get medical help right away. Call your local emergency services (911 in the [place] Do not drive yourself to the hospital. Let the emergency medical personnel know if you think you have COVID-19. Summary  COVID-19 is Kaytlan Behrman respiratory infection that is caused by Vishnu Moeller virus. It is also known as coronavirus disease or novel coronavirus. It can cause serious infections, such as pneumonia, acute respiratory distress syndrome, acute respiratory failure, or sepsis.  The virus that causes COVID-19 is contagious. This means that it can spread from person to person through droplets from breathing, speaking, singing, coughing, or sneezing.  You are more likely to develop Starr Urias serious illness if you are 72 years of age or older, have Neesha Langton weak immune system, live in [facility]na Kentner nursing home, or have chronic disease.  There is no medicine to treat COVID-19. Your health care provider will talk with you about ways to treat your symptoms.  Take steps to protect yourself and others from infection. Wash your hands often and disinfect objects and surfaces that are frequently touched every day. Stay away from people who are sick and wear Bayleigh Loflin mask if you are  sick. This information is not intended to replace advice given to you by your health care provider. Make sure you discuss any questions you have with your health care provider. Document Revised: 01/10/2019 Document Reviewed: 04/18/2018 Elsevier Patient Education  2020 [employer].

## 2019-04-10 NOTE — Progress Notes (Signed)
Ambulation Note  Saturation Pre: 96% on RA  Ambulation Distance: 150 ft  Saturation During Ambulation: 92-95% on RA  Notes: Used RW. Tolerated well. Pt returned to bed with call bell within reach, sats were 96% on RA.  Alisia Ferrari, MS, ACSM CEP 11:05 AM 04/10/2019

## 2019-04-10 NOTE — Progress Notes (Signed)
AVS explained to patient and wife over phone, scripts given to pt. Belongings retrieved for patient, PIV removed, RW will be picked up by Charge RN on the way out of building. No signs of respiratory distress, RA at 95%. Wife outside to take over care. No further questions.

## 2019-04-10 NOTE — Care Management Important Message (Signed)
Important Message  Patient Details  Name: Jonathon Stevens MRN: [id number] Date of Birth: 08-15-1945   Medicare Important Message Given:  Yes - Important Message mailed due to current National Emergency  Verbal consent obtained due to current National Emergency  Relationship to patient: Spouse/Significant Other Contact Name: Fredis Malkiewicz Call Date: 04/10/19  Time: 1103 Phone: [phone]   Important Message mailed to: Patient address on file    Oralia Rud Yosselin Zoeller 04/10/2019, 11:03 AM

## 2019-04-22 ENCOUNTER — Telehealth: Payer: Self-pay | Admitting: Interventional Cardiology

## 2019-04-22 ENCOUNTER — Ambulatory Visit: Payer: Medicare Other | Admitting: Interventional Cardiology

## 2019-04-22 ENCOUNTER — Encounter: Payer: Self-pay | Admitting: Interventional Cardiology

## 2019-04-22 ENCOUNTER — Other Ambulatory Visit: Payer: Self-pay

## 2019-04-22 VITALS — BP 154/74 | HR 49 | Ht 70.0 in | Wt 180.0 lb

## 2019-04-22 DIAGNOSIS — I4821 Permanent atrial fibrillation: Secondary | ICD-10-CM | POA: Diagnosis not present

## 2019-04-22 DIAGNOSIS — Z7901 Long term (current) use of anticoagulants: Secondary | ICD-10-CM | POA: Diagnosis not present

## 2019-04-22 DIAGNOSIS — E782 Mixed hyperlipidemia: Secondary | ICD-10-CM

## 2019-04-22 MED ORDER — METOPROLOL TARTRATE 25 MG PO TABS: 12.5000 mg | ORAL_TABLET | Freq: Two times a day (BID) | ORAL | 3 refills | Status: DC

## 2019-04-22 NOTE — Progress Notes (Signed)
Cardiology Office Note   Date:  04/22/2019   ID:  Jonathon Stevens, DOB [birth year], MRN [id number]  PCP:  Jonathon Shire, MD    No chief complaint on file.  AFib  Wt Readings from Last 3 Encounters:  04/22/19 180 lb (81.6 kg)  04/10/19 183 lb 6.8 oz (83.2 kg)  07/24/18 185 lb (83.9 kg)       History of Present Illness: Jonathon Stevens is a 74 y.o. male   who had a CVA. He had AFib dagnosed in the hospital at that time. He was started on anticoagulation with Xarelto at that time.   Much of the neurologic symptoms that he had at the time of his CVA have resolved.   Of note, there was a question of a mildly decreased left ventricular ejection fraction. The TEE on the following day in the hospital showed normal left ventricular ejection fraction. He did not have any ischemia evaluation due to lack of symptoms and resolution of LV dysfunction. This may have been tachycardia mediated.   The patient does not have symptoms concerning for COVID-19 infection (fever, chills, cough, or new shortness of breath).   Since the last visit, he did have COVID in early Jan.  Records showed: "Patient presented to ED with c/o generalized weakness, malaise, mild diarrhea. No N/V, no SOB nor cough. No known COVID exposures.  He was admitted for COVID pneumonia.  He's improved with steroids, remdesivir, and actemra as well as antibiotics.  Metoprolol was uptitrated for RVR during hospitalization.  Doing well on RA at discharge.  See below  Hospital Course:  Acute Hypoxic Respiratory Failure 2/2 COVID 19 Pneumonia S/p remdesivir, dex S/p ceftriaxone x 5 days S/p actemra on 1/6 CRP improved today I/O, daily weights IS, prone as able, OOB Atrial Fibrillation with LSL:[id number] metop to 100 mg BID, follow response. Continue xarelto.  Continue, follow outpatient.    Acute Metabolic Encephalopathy: With visual hallucinations here CT head without acute findings Likely related to  acute illness, hospitalization, and steroids Continue seroquel for now until discharge Improved  Transaminitis Due to COVID-19.Has improved.Will need follow-up in the outpatient setting.  Hyponatremia Improved. Continue to monitor..  Diabetes HbA1c 6.6. Elevated CBGs most likely due to steroids. Patient was started on Lantus. SSI being continued.Needs outpatient follow up for diabetes.  Gout Continue allopurinol.  History of stroke On anticoagulation for stroke prevention. PT and OT evaluation. Home health recommended with supervision  Staph epidermidis OTL:XBWIOM contaminant"  He was sent home on 100 mg Metoprolol BID.  Mild dizziness after taking the higher dose 9 wsa on 12.5 mg before COVID).  Since being home from [place], Denies : Chest pain.  Leg edema. Nitroglycerin use. Orthopnea. Palpitations. Paroxysmal nocturnal dyspnea. Shortness of breath. Syncope.     Past Medical History:  Diagnosis Date  . Atrial flutter ([place])   . 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 Diabetes mellitus without complication ([place])   . Gout   . HLD (hyperlipidemia)   . Hx of echocardiogram    a. ECHO 10/08/13: EF 45-50%, mild hypokinesis of apical myocardium. Mild LA dilation, mild RA dilation;  b.  TEE (10/09/13):  EF 50-55%, normal wall motion, no LAA clot  . Stroke [facility])     Past Surgical History:  Procedure Laterality Date  . TEE WITHOUT CARDIOVERSION N/A 10/09/2013   Procedure: TRANSESOPHAGEAL ECHOCARDIOGRAM (TEE);  Surgeon: Jonathon Casino, MD;  Location: [facility];  Service: Cardiovascular;  Laterality: N/A;  Current Outpatient Medications  Medication Sig Dispense Refill  . allopurinol (ZYLOPRIM) 300 MG tablet Take 300 mg by mouth daily after breakfast.     . atorvastatin (LIPITOR) 10 MG tablet TAKE 1 TABLET BY MOUTH DAILY AT 6PM (Patient taking differently: Take 10 mg by mouth every evening. ) 90 tablet 3  . blood glucose meter kit and supplies  KIT Dispense based on patient and insurance preference. Use up to four times daily as directed. (FOR ICD-9 250.00, 250.01). 1 each 0  . COENZYME Q10 PO Take 1 tablet by mouth daily.     Marland Kitchen GLUCOSAMINE-CHONDROITIN PO Take 1 tablet by mouth daily.     . metoprolol tartrate (LOPRESSOR) 100 MG tablet Take 1 tablet (100 mg total) by mouth 2 (two) times daily. 60 tablet 0  . Multiple Vitamin (MULTIVITAMIN) tablet Take 1 tablet by mouth daily.    . Omega-3 Fatty Acids (FISH OIL PO) Take 2 tablets by mouth daily.    . rivaroxaban (XARELTO) 20 MG TABS tablet Take 1 tablet (20 mg total) by mouth daily. 90 tablet 1   No current facility-administered medications for this visit.    Allergies:   Indomethacin    Social History:  The patient  reports that he quit smoking about 3 years ago. His smoking use included cigarettes. He has a 7.50 pack-year smoking history. He has never used smokeless tobacco. He reports that he does not drink alcohol or use drugs.   Family History:  The patient's family history includes Hypertension in his father and mother; Stroke in his mother.    ROS:  Please see the history of present illness.   Otherwise, review of systems are positive for recent COVID infection.   All other systems are reviewed and negative.    PHYSICAL EXAM: VS:  Wt 180 lb (81.6 kg)   BMI 25.10 kg/m  , BMI Body mass index is 25.1 kg/m. GEN: Well nourished, well developed, in no acute distress  HEENT: normal  Neck: no JVD, carotid bruits, or masses Cardiac: RRR; 2/6 systolic murmurs, no rubs, or gallops,no edema  Respiratory:  clear to auscultation bilaterally, normal work of breathing GI: soft, nontender, nondistended, + BS MS: no deformity or atrophy  Skin: warm and dry, no rash Neuro:  Strength and sensation are intact Psych: euthymic mood, full affect   EKG:   The ekg ordered today demonstrates sinus bradycardia, no ST segment changes   Recent Labs: 04/02/2019: B Natriuretic Peptide  199.4 04/10/2019: ALT 66; BUN 40; Creatinine, Ser 1.33; Hemoglobin 17.1; Magnesium 2.6; Platelets 279; Potassium 4.7; Sodium 137   Lipid Panel    Component Value Date/Time   CHOL 175 10/08/2013 0241   TRIG 46 03/30/2019 2146   HDL 41 10/08/2013 0241   CHOLHDL 4.3 10/08/2013 0241   VLDL 23 10/08/2013 0241   LDLCALC 111 (H) 10/08/2013 0241     Other studies Reviewed: Additional studies/ records that were reviewed today with results demonstrating:  Hospital records reviewed.   ASSESSMENT AND PLAN:  1. AFib/ flutter: Back in NSR.  HR slow likely due to high dose of metoprolol.  In the past, his HR was in the 60s.  TOmorrow, can start back metoprolol 12.5 mg BID.  Fatigue should improve as he recovers from [place]. 2. Hyperlipidemia: COntinue atorvastatin 3. Cardiomyopathy: resolved 4. Anticoagulated: No bleeding problems on Xarelto.    Current medicines are reviewed at length with the patient today.  The patient concerns regarding his medicines were addressed.  The  following changes have been made:  No change  Labs/ tests ordered today include:  No orders of the defined types were placed in this encounter.   Recommend 150 minutes/week of aerobic exercise Low fat, low carb, high fiber diet recommended  Disposition:   FU in 6 months   Signed, Larae Grooms, MD  04/22/2019 3:08 PM    [facility] [place], [place], [place]  [address] Phone: [phone]; Fax: [phone]

## 2019-04-22 NOTE — Telephone Encounter (Signed)
New Message    Pt c/o medication issue:  1. Name of Medication: metoprolol tartrate (LOPRESSOR) 100 MG tablet  2. How are you currently taking this medication (dosage and times per day)? 200 mg daily while in the hospital   3. Are you having a reaction (difficulty breathing--STAT)? No   4. What is your medication issue? Pt is calling and says he was increased to take 200 mg daly while in the Hospital and is wondering if he still needs to take it as 200 mg

## 2019-04-22 NOTE — Telephone Encounter (Signed)
Pt has appt today with Dr. Eldridge Dace today at 2:40.

## 2019-04-22 NOTE — Telephone Encounter (Signed)
Attempted to contact patient but there was no answer. Left message letting patient know that we would discuss this further at his appointment this afternoon.

## 2019-04-22 NOTE — Patient Instructions (Signed)
Medication Instructions:  Your physician has recommended you make the following change in your medication:   DECREASE: metoprolol tartrate (lopressor) to 12.5 mg twice a day (start tomorrow)  *If you need a refill on your cardiac medications before your next appointment, please call your pharmacy*  Lab Work: None ordered  If you have labs (blood work) drawn today and your tests are completely normal, you will receive your results only by: Marland Kitchen MyChart Message (if you have MyChart) OR . A paper copy in the mail If you have any lab test that is abnormal or we need to change your treatment, we will call you to review the results.  Testing/Procedures: None ordered  Follow-Up: At [facility], you and your health needs are our priority.  As part of our continuing mission to provide you with exceptional heart care, we have created designated Provider Care Teams.  These Care Teams include your primary Cardiologist (physician) and Advanced Practice Providers (APPs -  Physician Assistants and Nurse Practitioners) who all work together to provide you with the care you need, when you need it.  Your next appointment:   6 month(s)  The format for your next appointment:   In Person  Provider:   You may see Lance Muss, MD or one of the following Advanced Practice Providers on your designated Care Team:    Ronie Spies, PA-C  Jacolyn Reedy, PA-C   Other Instructions

## 2019-05-16 ENCOUNTER — Ambulatory Visit
Admission: RE | Admit: 2019-05-16 | Discharge: 2019-05-16 | Disposition: A | Discharge status: Home / Self Care | Payer: Medicare Other | Source: Ambulatory Visit | Attending: Family Medicine | Admitting: Family Medicine

## 2019-05-16 ENCOUNTER — Other Ambulatory Visit: Payer: Self-pay | Admitting: Family Medicine

## 2019-05-16 ENCOUNTER — Other Ambulatory Visit: Payer: Self-pay

## 2019-05-16 DIAGNOSIS — J690 Pneumonitis due to inhalation of food and vomit: Secondary | ICD-10-CM

## 2019-05-20 ENCOUNTER — Telehealth: Payer: Self-pay | Admitting: Interventional Cardiology

## 2019-05-20 NOTE — Telephone Encounter (Signed)
Dr. Vivi Ferns from [facility] in [place] would like to speak with Dr. Eldridge Dace about the patient. The number provided will go to the front desk.

## 2019-05-21 NOTE — Telephone Encounter (Signed)
Called to find out the best time for Dr. Eldridge Dace to call and speak to Dr. Vivi Ferns. She states that the best time to call is between 12:00 and 1:00 today and should call the front desk at [phone].

## 2019-05-21 NOTE — Telephone Encounter (Signed)
Dr. Eldridge Dace spoke with Dr. Vivi Ferns. Patient was having increased episodes of Afib so his metoprolol was increased to 50 mg BID, med list updated. SBP 120, HR 60s. Per Dr. Eldridge Dace, if patient continues to have issues with his Afib we will refer to EP.

## 2019-05-23 NOTE — Telephone Encounter (Signed)
Patient has had marked bradycardia on metoprolol 100 mg BID and AFib with RVR at lower doses of metoprolol.  Currently tolerating 50 mg BID at this time.  JV

## 2019-06-16 DIAGNOSIS — S32000A Wedge compression fracture of unspecified lumbar vertebra, initial encounter for closed fracture: Secondary | ICD-10-CM | POA: Insufficient documentation

## 2019-06-19 ENCOUNTER — Other Ambulatory Visit: Payer: Self-pay | Admitting: Family Medicine

## 2019-06-19 ENCOUNTER — Ambulatory Visit
Admission: RE | Admit: 2019-06-19 | Discharge: 2019-06-19 | Disposition: A | Discharge status: Home / Self Care | Payer: Medicare Other | Source: Ambulatory Visit | Attending: Family Medicine | Admitting: Family Medicine

## 2019-06-19 DIAGNOSIS — J189 Pneumonia, unspecified organism: Secondary | ICD-10-CM

## 2019-06-19 DIAGNOSIS — S32000A Wedge compression fracture of unspecified lumbar vertebra, initial encounter for closed fracture: Secondary | ICD-10-CM

## 2019-07-15 ENCOUNTER — Other Ambulatory Visit: Payer: Self-pay

## 2019-07-15 ENCOUNTER — Other Ambulatory Visit: Payer: Self-pay | Admitting: *Deleted

## 2019-07-15 MED ORDER — RIVAROXABAN 20 MG PO TABS: 20.0000 mg | ORAL_TABLET | Freq: Every day | ORAL | 1 refills | Status: DC

## 2019-07-15 MED ORDER — ATORVASTATIN CALCIUM 10 MG PO TABS: ORAL_TABLET | ORAL | 2 refills | Status: DC

## 2019-07-15 NOTE — Addendum Note (Signed)
Addended by: Mellody Dance B on: 07/15/2019 10:55 AM   Modules accepted: Orders

## 2019-07-15 NOTE — Telephone Encounter (Signed)
Prescription refill request for Xarelto received.   Last office visit: [place], 04/22/2019 Weight: 81.6 kg Age: 74 y.o. Scr: 1.33, 04/10/2019 CrCl: 56.24 ml/min   Prescription refill sent.

## 2019-10-13 ENCOUNTER — Other Ambulatory Visit: Payer: Self-pay | Admitting: Interventional Cardiology

## 2019-10-19 NOTE — Progress Notes (Signed)
Cardiology Office Note   Date:  10/21/2019   ID:  Jonathon Stevens, DOB Mar 29, 1945, MRN [id number]  PCP:  Stephens Shire, MD    No chief complaint on file.  AFib  Wt Readings from Last 3 Encounters:  10/21/19 190 lb (86.2 kg)  04/22/19 180 lb (81.6 kg)  04/10/19 183 lb 6.8 oz (83.2 kg)       History of Present Illness: Jonathon Stevens is a 74 y.o. male   who had a CVA. He had AFib dagnosed in the hospital at that time. He was started on anticoagulation with Xarelto at that time.   Much of the neurologic symptoms that he had at the time of his CVA have resolved.   Of note, therewas a question of a mildly decreased left ventricular ejection fraction. The TEE on the following day in the hospital showed normal left ventricular ejection fraction. He did not have any ischemia evaluation due to lack of symptoms and resolution of LV dysfunction. This may have been tachycardia mediated.   Since the last visit, he did have COVID in early Jan.  Records showed: "Patient presented to ED with c/o generalized weakness, malaise, mild diarrhea. No N/V, no SOB nor cough. No known COVID exposures.  He was admitted for COVID pneumonia. He's improved with steroids, remdesivir, and actemra as well as antibiotics. Metoprolol was uptitrated for RVR during hospitalization. Doing well on RA at discharge.  See below  Hospital Course: Acute Hypoxic Respiratory Failure 2/2 COVID 19 Pneumonia S/p remdesivir,dex S/p ceftriaxone x 5 days S/p actemra on 1/6 CRP improved today I/O, daily weights IS, prone as able, OOB Atrial Fibrillation with VPX:[id number] metop to 100 mg BID, follow response. Continue xarelto.Continue, follow outpatient.  Acute Metabolic Encephalopathy: With visual hallucinations here CT head without acute findings Likely related to acute illness, hospitalization, and steroids Continue seroquel for now until discharge Improved  Transaminitis Due to  COVID-19.Has improved.Will need follow-up in the outpatient setting.  Hyponatremia Improved. Continue to monitor..  Diabetes HbA1c 6.6. Elevated CBGs most likely due to steroids. Patient was started on Lantus. SSI being continued.Needs outpatient follow upfor diabetes.  Gout Continue allopurinol.  History of stroke On anticoagulation for stroke prevention. PT and OT evaluation. Home health recommended with supervision  Staph epidermidis NIO:EVOJJK contaminant"  He was sent home on 100 mg Metoprolol BID.  Mild dizziness after taking the higher dose.  Was on 12.5 mg before COVID).  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  He has also been vaccinated.    Denies : Chest pain. Dizziness. Leg edema. Nitroglycerin use. Orthopnea. Palpitations. Paroxysmal nocturnal dyspnea. Shortness of breath. Syncope.   Being sent to pulmonary for     Past Medical History:  Diagnosis Date  . Atrial flutter ([place])   . CVA (cerebral infarction)    embolic CVA 0/9381  . Diabetes mellitus without complication ([place])   . Gout   . HLD (hyperlipidemia)   . Hx of echocardiogram    a. ECHO 10/08/13: EF 45-50%, mild hypokinesis of apical myocardium. Mild LA dilation, mild RA dilation;  b.  TEE (10/09/13):  EF 50-55%, normal wall motion, no LAA clot  . Stroke [facility])     Past Surgical History:  Procedure Laterality Date  . TEE WITHOUT CARDIOVERSION N/A 10/09/2013   Procedure: TRANSESOPHAGEAL ECHOCARDIOGRAM (TEE);  Surgeon: Pixie Casino, MD;  Location: [facility] ENDOSCOPY;  Service: Cardiovascular;  Laterality: N/A;     Current Outpatient Medications  Medication  Sig Dispense Refill  . allopurinol (ZYLOPRIM) 300 MG tablet Take 300 mg by mouth daily after breakfast.     . atorvastatin (LIPITOR) 10 MG tablet TAKE 1 TABLET BY MOUTH DAILY AT 6PM 90 tablet 2  . blood glucose meter kit and supplies KIT Dispense based on patient and insurance preference. Use up to four times  daily as directed. (FOR ICD-9 250.00, 250.01). 1 each 0  . COENZYME Q10 PO Take 1 tablet by mouth daily.     Marland Kitchen GLUCOSAMINE-CHONDROITIN PO Take 1 tablet by mouth daily.     . Multiple Vitamin (MULTIVITAMIN) tablet Take 1 tablet by mouth daily.    . Omega-3 Fatty Acids (FISH OIL PO) Take 2 tablets by mouth daily.    . rivaroxaban (XARELTO) 20 MG TABS tablet Take 1 tablet (20 mg total) by mouth daily. 90 tablet 1  . STIOLTO RESPIMAT 2.5-2.5 MCG/ACT AERS Inhale 2 puffs into the lungs daily.    . metoprolol tartrate (LOPRESSOR) 25 MG tablet Take 1 tablet (25 mg total) by mouth 2 (two) times daily. 180 tablet 3   No current facility-administered medications for this visit.    Allergies:   Indomethacin    Social History:  The patient  reports that he quit smoking about 4 years ago. His smoking use included cigarettes. He has a 7.50 pack-year smoking history. He has never used smokeless tobacco. He reports that he does not drink alcohol and does not use drugs.   Family History:  The patient's family history includes Hypertension in his father and mother; Stroke in his mother.    ROS:  Please see the history of present illness.   Otherwise, review of systems are positive for fatigue since he had COVID.   All other systems are reviewed and negative.    PHYSICAL EXAM: VS:  BP (!) 140/72   Pulse 56   Ht 5' 10.5" (1.791 m)   Wt 190 lb (86.2 kg)   SpO2 97%   BMI 26.88 kg/m  , BMI Body mass index is 26.88 kg/m. GEN: Well nourished, well developed, in no acute distress  HEENT: normal  Neck: no JVD, carotid bruits, or masses Cardiac: RRR; no murmurs, rubs, or gallops,no edema  Respiratory:  clear to auscultation bilaterally, normal work of breathing GI: soft, nontender, nondistended, + BS MS: no deformity or atrophy  Skin: warm and dry, no rash Neuro:  Strength and sensation are intact Psych: euthymic mood, full affect   EKG:   The ekg ordered today demonstrates    Recent  Labs: 04/02/2019: B Natriuretic Peptide 199.4 04/10/2019: ALT 66; BUN 40; Creatinine, Ser 1.33; Hemoglobin 17.1; Magnesium 2.6; Platelets 279; Potassium 4.7; Sodium 137   Lipid Panel    Component Value Date/Time   CHOL 175 10/08/2013 0241   TRIG 46 03/30/2019 2146   HDL 41 10/08/2013 0241   CHOLHDL 4.3 10/08/2013 0241   VLDL 23 10/08/2013 0241   LDLCALC 111 (H) 10/08/2013 0241     Other studies Reviewed: Additional studies/ records that were reviewed today with results demonstrating: [facility] labs reviewed.   ASSESSMENT AND PLAN:  1. AFib/flutter: Has had some bradycardia in the past.  Metoprolol 12.5 mg was restarted at the last visit, but he was taking 50 mg daily.  Will call in 25 mg BID.  2. Hyperlipidemia: LDL 68 in 3/21 at [facility].  Continue atorvastatin.   3. Cardiomyopathy: EF now 55% by most recent echo.  4. Anticoagulated: No bleeding issues.  5. Now on an inhaler.  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 Increased activity recommended. 6. Prediabetic: A1C 6.3. Whole food, plant based diet.   Current medicines are reviewed at length with the patient today.  The patient concerns regarding his medicines were addressed.  The following changes have been made:  No change  Labs/ tests ordered today include:  No orders of the defined types were placed in this encounter.   Recommend 150 minutes/week of aerobic exercise Low fat, low carb, high fiber diet recommended  Disposition:   FU in 1 year   Signed, Larae Grooms, MD  10/21/2019 5:31 PM    [facility] [place], [place], [place]  [address] Phone: [phone]; Fax: [phone]

## 2019-10-21 ENCOUNTER — Ambulatory Visit: Payer: Medicare Other | Admitting: Interventional Cardiology

## 2019-10-21 ENCOUNTER — Encounter: Payer: Self-pay | Admitting: Interventional Cardiology

## 2019-10-21 ENCOUNTER — Other Ambulatory Visit: Payer: Self-pay

## 2019-10-21 VITALS — BP 140/72 | HR 56 | Ht 70.5 in | Wt 190.0 lb

## 2019-10-21 DIAGNOSIS — Z7901 Long term (current) use of anticoagulants: Secondary | ICD-10-CM | POA: Diagnosis not present

## 2019-10-21 DIAGNOSIS — R7303 Prediabetes: Secondary | ICD-10-CM | POA: Diagnosis not present

## 2019-10-21 DIAGNOSIS — I4821 Permanent atrial fibrillation: Secondary | ICD-10-CM

## 2019-10-21 DIAGNOSIS — E782 Mixed hyperlipidemia: Secondary | ICD-10-CM

## 2019-10-21 MED ORDER — METOPROLOL TARTRATE 25 MG PO TABS: 25.0000 mg | ORAL_TABLET | Freq: Two times a day (BID) | ORAL | 3 refills | Status: DC

## 2019-10-21 NOTE — Patient Instructions (Signed)
Medication Instructions:  1. Decrease metoprolol to 25 mg tablet, take 1 tablet by mouth twice a day. *If you need a refill on your cardiac medications before your next appointment, please call your pharmacy*   Lab Work: None If you have labs (blood work) drawn today and your tests are completely normal, you will receive your results only by: Marland Kitchen MyChart Message (if you have MyChart) OR . A paper copy in the mail If you have any lab test that is abnormal or we need to change your treatment, we will call you to review the results.   Testing/Procedures: None   Follow-Up: At [facility], you and your health needs are our priority.  As part of our continuing mission to provide you with exceptional heart care, we have created designated Provider Care Teams.  These Care Teams include your primary Cardiologist (physician) and Advanced Practice Providers (APPs -  Physician Assistants and Nurse Practitioners) who all work together to provide you with the care you need, when you need it.  We recommend signing up for the patient portal called "MyChart".  Sign up information is provided on this After Visit Summary.  MyChart is used to connect with patients for Virtual Visits (Telemedicine).  Patients are able to view lab/test results, encounter notes, upcoming appointments, etc.  Non-urgent messages can be sent to your provider as well.   To learn more about what you can do with MyChart, go to [email or web address].    Your next appointment:   12 month(s)  The format for your next appointment:   In Person  Provider:   You may see Lance Muss, MD or one of the following Advanced Practice Providers on your designated Care Team:    Ronie Spies, PA-C  Jacolyn Reedy, PA-C

## 2020-01-11 ENCOUNTER — Other Ambulatory Visit: Payer: Self-pay | Admitting: Interventional Cardiology

## 2020-01-12 NOTE — Telephone Encounter (Signed)
Pt last saw Dr Eldridge Dace 10/21/19, last labs 04/10/19 Creat 1.33, age 74, weight 86.2kg, CrCl 59.41, based on CrCl pt is on appropriate dosage of Xarelto 20mg  QD.  Will refill rx.

## 2020-03-01 IMAGING — CR DG CHEST 2V
1 series · 1 of 1 positions shown · non-contrast
Comparison: Chest x-ray 04/06/2019, 10/07/2013. Lumbar MRI
12/06/2017.

CLINICAL DATA: Pneumonia.  COVID positive.

EXAM:
CHEST - 2 VIEW

[w chest lat]
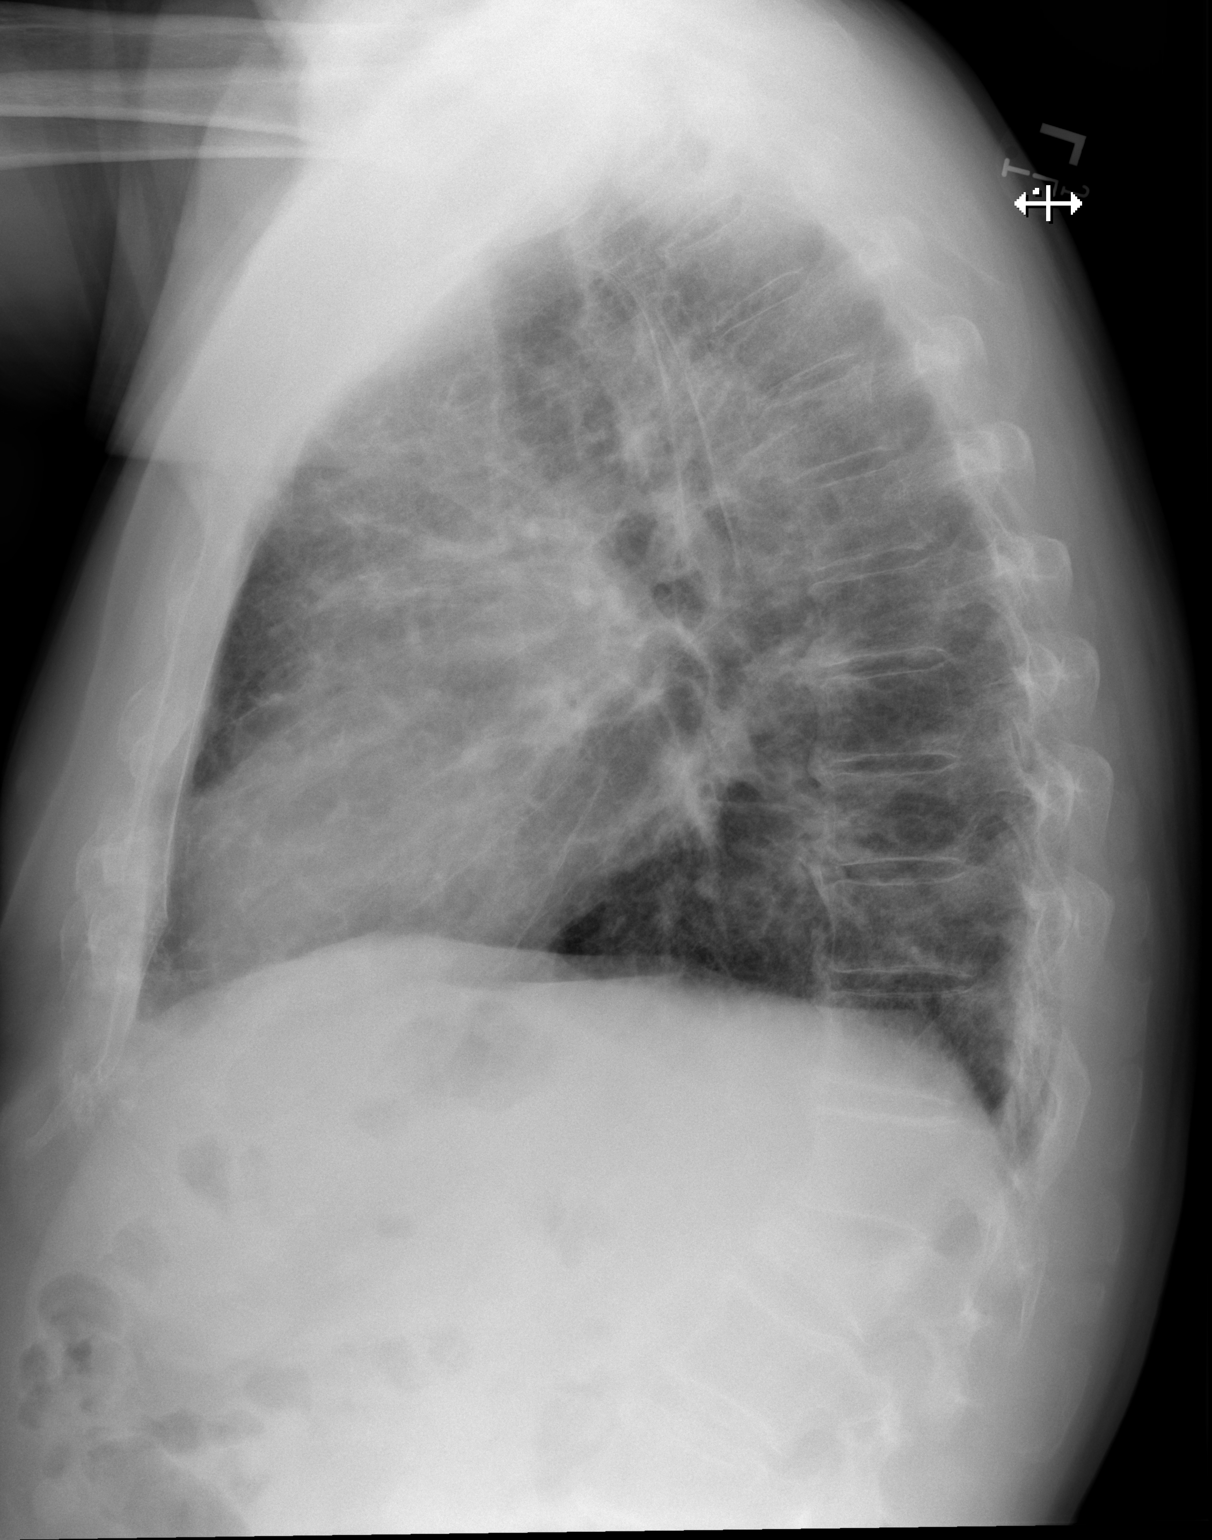

[1 of 1 positions shown; findings below may reference images not displayed]

FINDINGS: Mediastinum and hilar structures normal. Heart size normal. Diffuse
bilateral interstitial infiltrates are again noted without interim
change. No pleural effusion or pneumothorax. Upper lumbar moderate
compression fracture, new from prior lumbar MRI 12/06/2017.
IMPRESSION: Number diffuse bilateral pulmonary interstitial infiltrates are
again noted consistent with pneumonitis in this known COVID positive
patient. No significant interim change noted from prior exam.

2. Upper lumbar moderate compression fracture, new from prior lumbar
MRI of 12/06/2017.

## 2020-03-11 DIAGNOSIS — K219 Gastro-esophageal reflux disease without esophagitis: Secondary | ICD-10-CM | POA: Insufficient documentation

## 2020-03-11 DIAGNOSIS — R197 Diarrhea, unspecified: Secondary | ICD-10-CM | POA: Insufficient documentation

## 2020-04-06 DIAGNOSIS — I719 Aortic aneurysm of unspecified site, without rupture: Secondary | ICD-10-CM | POA: Insufficient documentation

## 2020-04-07 ENCOUNTER — Telehealth: Payer: Self-pay | Admitting: Interventional Cardiology

## 2020-04-07 DIAGNOSIS — I714 Abdominal aortic aneurysm, without rupture, unspecified: Secondary | ICD-10-CM

## 2020-04-07 NOTE — Telephone Encounter (Signed)
Patient is calling back in to see if there is any updates on this request. Advised that it is being looked over and we would reach out as soon as possible. Rerouted to ensure it stays in Dr. Hoyle Barr inbox.

## 2020-04-07 NOTE — Telephone Encounter (Addendum)
Pt is calling in to let Dr. Eldridge Dace and RN know that yesterday, he was sent to have an MRI done of his back, for he has been having ongoing back problems for sometime.   Pt states his PCP referred him for the MRI, where he had done at [facility] yesterday, on an outpatient basis.  Pt states he completed the MRI and received his results.  Pt states part of the results shows an over-read of incidental findings, of him having 2 different findings. Pt states first finding was infrarenal abdominal aortic aneurysm measuring 4.1 cm.  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  Pt states the report recommends follow-up with a CTA of his abdomen and pelvis.    Pt states his PCP reviewed this report and based on these findings, he  advised him that he would need to see a vascular Surgeon for this to be addressed, before even proceeding on having back surgery.    Pt states his PCP referred him to a Vascular Surgeon for the aneurysms and Neurosurgeon for the back.   Pt stated to his PCP that he prefers seeing his Cardiologist Dr. Eldridge Dace for the abnormal reads of noted aneurysms before proceeding to a Vascular Surgeon.    Pt states he told his PCP to hold off on referral and that he would call our office and get an appt with Dr. Eldridge Dace asap, to discuss these results and further plan to manage this.   Pt states that [facility] was going to fax over MRI report to our office for Dr. Eldridge Dace to review.  Pt states they said they faxed this report to him last night.  You can also see the report in encounters under outpatient visit for MRI at [facility].  Below is copied over-read showing pts abnormal finding, from his MRI yesterday.  Bolded in black is the over read report showing findings and follow-up recommendations for findings.  Informed the pt that I will route this information to both Dr. Eldridge Dace and his Primary RN for further review and advise on this  matter.  Informed the pt that they can advise on follow-up with them based on this report, but Dr. Eldridge Dace would need to review first.  Informed the pt that I will make them aware that  they can see the MRI report in epic as well for their review.   Informed the pt that Dr. Hoyle Barr RN will follow-up with him accordingly thereafter, once advisement is provided to her.   Did highly advise the pt to proceed with recommended referrals by his PCP based on MRI results, but he is insisting Dr. Eldridge Dace handle this matter first, if possible.  Pt verbalized understanding and agrees with this plan.   Narrative  [facility] - 04/06/2020 1:16 PM EST  CLINICAL DATA: Worsening low back pain. Known L1 vertebral body compression fracture.  EXAM: MRI LUMBAR SPINE WITHOUT CONTRAST  TECHNIQUE: Multiplanar, multisequence MR imaging of the lumbar spine was performed. No intravenous contrast was administered.  COMPARISON: X-ray 06/19/2019, MRI 12/06/2017  FINDINGS: Segmentation: Standard.  Alignment: No static listhesis.  Vertebrae: Severe compression fracture of the L1 vertebral body involving both the superior and inferior endplates with near complete height loss centrally. There is residual bone marrow edema within the vertebral body (series 4, images 5-10). No definite progression of height loss when compared to the previous radiographs. Approximately 5 mm of bony retropulsion of the superior endplate without associated canal stenosis. No  fracture extension or edema within the posterior elements. Remaining vertebral body heights are maintained. No additional fractures. No evidence of discitis. No suspicious bone lesion.  Conus medullaris and cauda equina: Conus extends to the T12 level. Conus and cauda equina appear normal.  Paraspinal and other soft tissues: Partial visualization of patient's known infrarenal abdominal aortic aneurysm measuring at least 4.1 cm, although evaluation is  significantly degraded secondary to saturation band through this region. There is also aneurysmal dilatation of the is right common iliac artery measuring approximately 3.3 cm (measured approximately 3.0 cm on 12/06/2017).  Disc levels:  T12-L1: No significant disc protrusion, foraminal stenosis, or canal stenosis.  L1-L2: No significant disc protrusion, foraminal stenosis, or canal stenosis.  L2-L3: Mild broad-based disc bulge. No foraminal or canal stenosis. Unchanged.  L3-L4: Mild broad-based disc bulge. Mild bilateral facet hypertrophy and ligamentum flavum buckling. No foraminal or canal stenosis. Unchanged.  L4-L5: Mild broad-based disc bulge. Right greater than left facet arthrosis and ligamentum flavum buckling. Mild-moderate right foraminal stenosis. No canal stenosis. Unchanged.  L5-S1: Mild broad-base disc bulge. Mild bilateral facet arthropathy. No foraminal or canal stenosis. Unchanged.  IMPRESSION: 1. Severe compression fracture of the L1 vertebral body with near-complete height loss centrally. There is residual bone marrow edema within the vertebral body which may reflect an acute on chronic component or reactive/stress changes. No definite progression of height loss when compared to the previous radiographs. Approximately 5 mm of bony retropulsion of the superior endplate without associated canal stenosis. 2. Mild multilevel lumbar spondylosis with mild-moderate right foraminal stenosis at L4-L5. No canal stenosis at any level. 3. Partial visualization of patient's known infrarenal abdominal aortic aneurysm measuring at least 4.1 cm, although evaluation is significantly degraded secondary to saturation band through this region. There is also aneurysmal dilatation of the right common iliac artery measuring approximately 3.3 cm (measured approximately 3.0 cm on 12/06/2017). Recommend follow-up with CTA of the abdomen and pelvis given evidence of interval  growth of the right common iliac artery aneurysm.  These results will be called to the ordering clinician or representative by the Radiologist Assistant, and communication documented in the PACS or [employer].   Electronically Signed By: Duanne Guess D.O. On: 04/06/2020 13:16

## 2020-04-07 NOTE — Telephone Encounter (Signed)
Patient says that he went to see PCP and was informed that he needs to see Dr. Eldridge Dace asap due to embolisms. Says that MRI results has been uploaded and sent. Please call to verify

## 2020-04-08 NOTE — Telephone Encounter (Signed)
I do think an appt with vascular surgery is reasonable.  THe AAA is not to the point that it would require repair but the iliac aneurysms is large and I am not sure if that would be an issue.  THey may request f/u scan in 6 months, but I think it is reasonable to get their opinion. They would be the ones to fix if needed.   JV

## 2020-04-08 NOTE — Telephone Encounter (Signed)
Detailed message left for patient (DPR on file).  Did not place referral to [facility] as previous notes say PCP is referring.

## 2020-04-09 ENCOUNTER — Other Ambulatory Visit: Payer: Self-pay | Admitting: Interventional Cardiology

## 2020-04-23 ENCOUNTER — Telehealth: Payer: Self-pay | Admitting: Interventional Cardiology

## 2020-04-23 DIAGNOSIS — I723 Aneurysm of iliac artery: Secondary | ICD-10-CM

## 2020-04-23 DIAGNOSIS — I714 Abdominal aortic aneurysm, without rupture, unspecified: Secondary | ICD-10-CM

## 2020-04-23 NOTE — Telephone Encounter (Signed)
Left message to call office

## 2020-04-23 NOTE — Telephone Encounter (Signed)
I spoke with patient and gave him information from Dr Eldridge Dace in previous phone note regarding AAA and iliac aneurysm.  Patient reports PCP did not place referral to [facility] and he would like our office to do this.  Will place referral.  Patient aware [facility] will contact him with appointment information.

## 2020-04-23 NOTE — Telephone Encounter (Signed)
Patient calling to follow up on a referral. He is upset because he has not heard anything about scheduling with the vascular surgeon and it has been 2 weeks.

## 2020-04-23 NOTE — Telephone Encounter (Signed)
Patient returning call.

## 2020-05-12 ENCOUNTER — Other Ambulatory Visit: Payer: Self-pay

## 2020-05-12 DIAGNOSIS — I714 Abdominal aortic aneurysm, without rupture, unspecified: Secondary | ICD-10-CM

## 2020-05-31 ENCOUNTER — Ambulatory Visit: Payer: Medicare Other | Admitting: Vascular Surgery

## 2020-05-31 ENCOUNTER — Ambulatory Visit (HOSPITAL_COMMUNITY)
Admission: RE | Admit: 2020-05-31 | Discharge: 2020-05-31 | Disposition: A | Discharge status: Home / Self Care | Payer: Medicare Other | Source: Ambulatory Visit | Attending: Vascular Surgery | Admitting: Vascular Surgery

## 2020-05-31 ENCOUNTER — Encounter: Payer: Self-pay | Admitting: Vascular Surgery

## 2020-05-31 ENCOUNTER — Other Ambulatory Visit: Payer: Self-pay

## 2020-05-31 VITALS — BP 126/69 | HR 55 | Temp 98.0°F | Resp 20 | Ht 70.5 in | Wt 193.0 lb

## 2020-05-31 DIAGNOSIS — I714 Abdominal aortic aneurysm, without rupture, unspecified: Secondary | ICD-10-CM

## 2020-05-31 NOTE — Progress Notes (Signed)
Patient ID: Jonathon Stevens, male   DOB: 1945-06-29, 75 y.o.   MRN: [id number]  Reason for Consult: New Patient (Initial Visit)   Referred by Stephens Shire, MD  Subjective:     HPI:  Jonathon Stevens is a 75 y.o. male history of atrial flutter currently on Xarelto.  Was recently hospitalized with COVID-19.  Was diagnosed with compression fracture underwent MRI of his back was identified to have abdominal aortic aneurysm as well as right common iliac artery aneurysm.  He denies any new abdominal pain he does have the back pain from his compression fracture.  He is a former smoker having quit several years ago.  He also has hyperlipidemia and diabetes has risk factors as well as hypertension.  Other than feeling cold all the time he tolerates his Xarelto very well.  He has no previous personal or family history of aneurysm disease.  Past Medical History:  Diagnosis Date  . Atrial flutter ([place])   . 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 Diabetes mellitus without complication ([place])   . Gout   . HLD (hyperlipidemia)   . Hx of echocardiogram    a. ECHO 10/08/13: EF 45-50%, mild hypokinesis of apical myocardium. Mild LA dilation, mild RA dilation;  b.  TEE (10/09/13):  EF 50-55%, normal wall motion, no LAA clot  . Hypertension   . Stroke [facility])    Family History  Problem Relation Age of Onset  . Hypertension Mother   . Stroke Mother   . Hypertension Father   . Heart attack Neg Hx    Past Surgical History:  Procedure Laterality Date  . TEE WITHOUT CARDIOVERSION N/A 10/09/2013   Procedure: TRANSESOPHAGEAL ECHOCARDIOGRAM (TEE);  Surgeon: Pixie Casino, MD;  Location: [facility] ENDOSCOPY;  Service: Cardiovascular;  Laterality: N/A;    Short Social History:  Social History   Tobacco Use  . Smoking status: Former Smoker    Packs/day: 0.50    Years: 15.00    Pack years: 7.50    Types: Cigarettes    Quit date: 07/13/2015    Years since quitting: 4.8  . Smokeless tobacco:  Never Used  Substance Use Topics  . Alcohol use: No    Alcohol/week: 0.0 standard drinks    Allergies  Allergen Reactions  . Cyclobenzaprine Nausea Only  . Indomethacin Rash    Current Outpatient Medications  Medication Sig Dispense Refill  . allopurinol (ZYLOPRIM) 300 MG tablet Take 300 mg by mouth daily after breakfast.     . atorvastatin (LIPITOR) 10 MG tablet TAKE 1 TABLET BY MOUTH DAILY AT 6 PM 90 tablet 1  . COENZYME Q10 PO Take 1 tablet by mouth daily.     Marland Kitchen GLUCOSAMINE-CHONDROITIN PO Take 1 tablet by mouth daily.     . metoprolol tartrate (LOPRESSOR) 25 MG tablet Take 1 tablet (25 mg total) by mouth 2 (two) times daily. 180 tablet 3  . Multiple Vitamin (MULTIVITAMIN) tablet Take 1 tablet by mouth daily.    . Omega-3 Fatty Acids (FISH OIL PO) Take 2 tablets by mouth daily.    Marland Kitchen PROLIA 60 MG/ML SOSY injection Inject into the skin.    Marland Kitchen STIOLTO RESPIMAT 2.5-2.5 MCG/ACT AERS Inhale 2 puffs into the lungs daily.    Alveda Reasons 20 MG TABS tablet TAKE 1 TABLET(20 MG) BY MOUTH DAILY 90 tablet 1  . blood glucose meter kit and supplies KIT Dispense based on patient and insurance preference. Use up to four times daily  as directed. (FOR ICD-9 250.00, 250.01). 1 each 0   No current facility-administered medications for this visit.    Review of Systems  Constitutional:  Constitutional negative. HENT: HENT negative.  Eyes: Eyes negative.  Respiratory: Respiratory negative.  Cardiovascular: Cardiovascular negative.  GI: Gastrointestinal negative.  Musculoskeletal: Positive for back pain.  Skin: Skin negative.  Neurological: Neurological negative. Hematologic: Hematologic/lymphatic negative.  Psychiatric: Psychiatric negative.        Objective:  Objective   Vitals:   05/31/20 0935  BP: 126/69  Pulse: (!) 55  Resp: 20  Temp: 98 F (36.7 C)  SpO2: 96%  Weight: 193 lb (87.5 kg)  Height: 5' 10.5" (1.791 m)   Body mass index is 27.3 kg/m.  Physical Exam HENT:     Head:  Normocephalic.     Nose:     Comments: Wearing a mask Eyes:     Pupils: Pupils are equal, round, and reactive to light.  Neck:     Vascular: No carotid bruit.  Cardiovascular:     Rate and Rhythm: Normal rate.     Pulses:          Popliteal pulses are 2+ on the right side.       Dorsalis pedis pulses are 2+ on the right side and 2+ on the left side.       Posterior tibial pulses are 2+ on the right side and 2+ on the left side.     Heart sounds: Normal heart sounds.  Pulmonary:     Effort: Pulmonary effort is normal.     Breath sounds: Normal breath sounds.  Abdominal:     General: Abdomen is flat.     Palpations: Abdomen is soft. There is no mass.  Musculoskeletal:        General: No swelling.  Skin:    General: Skin is warm.     Capillary Refill: Capillary refill takes less than 2 seconds.  Neurological:     General: No focal deficit present.     Mental Status: He is alert.     Deep Tendon Reflexes: Abnormal reflex:   Psychiatric:        Mood and Affect: Mood normal.        Behavior: Behavior normal.        Thought Content: Thought content normal.        Judgment: Judgment normal.     Data: Abdominal Aorta Findings:  +-----------+-------+----------+----------+--------+--------+--------+  Location  AP (cm)Trans (cm)PSV (cm/s)WaveformThrombusComments  +-----------+-------+----------+----------+--------+--------+--------+  Mid    3.99  4.26                      +-----------+-------+----------+----------+--------+--------+--------+  Distal   3.90  4.27   34                  +-----------+-------+----------+----------+--------+--------+--------+  RT CIA Prox3.1  3.2    100                  +-----------+-------+----------+----------+--------+--------+--------+  LT CIA Prox1.5  1.5    90                   +-----------+-------+----------+----------+--------+--------+--------+   Visualization of the Proximal Abdominal Aorta was limited.     Summary:  Abdominal Aorta: There is evidence of abnormal dilatation of the  mid/distal Abdominal aorta.  The largest aortic measurement is 4.3 cm.  Aneurysmal dilatation of the right common iliac artery with a maximum  diameter of 3.2 cm.  Assessment/Plan:     75 year old male sent for evaluation of small aortic aneurysm of 4.3 cm and right common iliac artery aneurysm of 3.2 cm.  I discussed with him that he is a very low risk for rupture at this time.  We will plan for repeat aortoiliac duplex in 6 months.  Plan will be for CT scan when aortic aneurysm greater than 5 cm and iliac aneurysm approaching 3.5 to 4 cm.  We discussed any new back or abdominal pain would require urgent evaluation he demonstrates good understanding.  He is okay for exercise that does not require straining.     Waynetta Sandy MD [facility]

## 2020-06-02 ENCOUNTER — Other Ambulatory Visit: Payer: Self-pay

## 2020-06-02 DIAGNOSIS — I714 Abdominal aortic aneurysm, without rupture, unspecified: Secondary | ICD-10-CM

## 2020-06-20 ENCOUNTER — Encounter (HOSPITAL_COMMUNITY): Payer: Self-pay | Admitting: Emergency Medicine

## 2020-06-20 ENCOUNTER — Emergency Department (HOSPITAL_COMMUNITY)
Admission: EM | Admit: 2020-06-20 | Discharge: 2020-06-20 | Disposition: A | Discharge status: Home / Self Care | Payer: Medicare Other | Attending: Emergency Medicine | Admitting: Emergency Medicine

## 2020-06-20 ENCOUNTER — Other Ambulatory Visit: Payer: Self-pay

## 2020-06-20 DIAGNOSIS — Z8616 Personal history of COVID-19: Secondary | ICD-10-CM | POA: Insufficient documentation

## 2020-06-20 DIAGNOSIS — I480 Paroxysmal atrial fibrillation: Secondary | ICD-10-CM | POA: Insufficient documentation

## 2020-06-20 DIAGNOSIS — Z79899 Other long term (current) drug therapy: Secondary | ICD-10-CM | POA: Diagnosis not present

## 2020-06-20 DIAGNOSIS — Z7901 Long term (current) use of anticoagulants: Secondary | ICD-10-CM | POA: Insufficient documentation

## 2020-06-20 DIAGNOSIS — S61213A Laceration without foreign body of left middle finger without damage to nail, initial encounter: Secondary | ICD-10-CM | POA: Insufficient documentation

## 2020-06-20 DIAGNOSIS — I10 Essential (primary) hypertension: Secondary | ICD-10-CM | POA: Diagnosis not present

## 2020-06-20 DIAGNOSIS — Z87891 Personal history of nicotine dependence: Secondary | ICD-10-CM | POA: Diagnosis not present

## 2020-06-20 DIAGNOSIS — Z23 Encounter for immunization: Secondary | ICD-10-CM | POA: Insufficient documentation

## 2020-06-20 DIAGNOSIS — E119 Type 2 diabetes mellitus without complications: Secondary | ICD-10-CM | POA: Diagnosis not present

## 2020-06-20 DIAGNOSIS — W268XXA Contact with other sharp object(s), not elsewhere classified, initial encounter: Secondary | ICD-10-CM | POA: Insufficient documentation

## 2020-06-20 DIAGNOSIS — J441 Chronic obstructive pulmonary disease with (acute) exacerbation: Secondary | ICD-10-CM | POA: Insufficient documentation

## 2020-06-20 DIAGNOSIS — S6992XA Unspecified injury of left wrist, hand and finger(s), initial encounter: Secondary | ICD-10-CM | POA: Diagnosis present

## 2020-06-20 MED ORDER — CEPHALEXIN 500 MG PO CAPS: 500.0000 mg | ORAL_CAPSULE | Freq: Two times a day (BID) | ORAL | 0 refills | Status: AC

## 2020-06-20 MED ORDER — LIDOCAINE HCL (PF) 1 % IJ SOLN
INTRAMUSCULAR | Status: AC
  Filled 2020-06-20: qty 30

## 2020-06-20 MED ORDER — TETANUS-DIPHTH-ACELL PERTUSSIS 5-2.5-18.5 LF-MCG/0.5 IM SUSY
0.5000 mL | PREFILLED_SYRINGE | Freq: Once | INTRAMUSCULAR | Status: AC
  Administered 2020-06-20: 0.5 mL via INTRAMUSCULAR
  Filled 2020-06-20: qty 0.5

## 2020-06-20 NOTE — ED Triage Notes (Signed)
Pt reports approx 1 inch laceration to L middle finger from a food processor that he was moving off of a shelf.  Went to an urgent care and they sent him to ED.  Pressure bandage with coban in place.  Pt takes Xarelto. Unknown DT.

## 2020-06-20 NOTE — ED Provider Notes (Signed)
[place] EMERGENCY DEPARTMENT Provider Note   CSN: [id number] Arrival date & time: 06/20/20  1527     History Chief Complaint  Patient presents with  . finger laceration    Jonathon Stevens is a 75 y.o. male with a flutter on xarelto, right handed, here with left 3rd finger laceration.  Cut his finger on a food blender blade today.  Went to UC and was sent to ED for evaluation.  No fevers, chills.  Unsure of last tetanus shot but definitely none in the past 5 years.  Otherwise feels well.  Here with wife.  HPI     Past Medical History:  Diagnosis Date  . Atrial flutter ([place])   . CVA (cerebral infarction)    embolic CVA 09/3708  . Diabetes mellitus without complication ([place])   . Gout   . HLD (hyperlipidemia)   . Hx of echocardiogram    a. ECHO 10/08/13: EF 45-50%, mild hypokinesis of apical myocardium. Mild LA dilation, mild RA dilation;  b.  TEE (10/09/13):  EF 50-55%, normal wall motion, no LAA clot  . Hypertension   . Stroke [facility])     Patient Active Problem List   Diagnosis Date Noted  . Aortic aneurysm without rupture ([place]) 04/06/2020  . Diarrhea 03/11/2020  . Gastro-esophageal reflux disease without esophagitis 03/11/2020  . Lumbar compression fracture ([place]) 06/16/2019  . Atrial fibrillation with RVR ([place]) 03/30/2019  . COVID-19 virus infection 03/30/2019  . Colon cancer screening 11/16/2017  . 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 Sciatica of right side 10/12/2017  . Chronic obstructive pulmonary disease with acute exacerbation ([place]) 08/27/2017  . History of cardioembolic stroke 62/69/4854  . Anticoagulated 07/13/2016  . Erectile dysfunction 06/22/2015  . Impaired fasting glucose 06/22/2015  . Primary gout 06/22/2015  . Paroxysmal atrial fibrillation ([place]) 11/05/2014  . Abnormal electrocardiogram 02/02/2014  . Stroke ([place]) 11/19/2013  . HLD (hyperlipidemia) 10/27/2013  . Prediabetes 10/10/2013  . Atrial flutter ([place])   .  Cerebral embolism with cerebral infarction ([place]) 10/07/2013    Past Surgical History:  Procedure Laterality Date  . TEE WITHOUT CARDIOVERSION N/A 10/09/2013   Procedure: TRANSESOPHAGEAL ECHOCARDIOGRAM (TEE);  Surgeon: Pixie Casino, MD;  Location: [facility] ENDOSCOPY;  Service: Cardiovascular;  Laterality: N/A;       Family History  Problem Relation Age of Onset  . Hypertension Mother   . Stroke Mother   . Hypertension Father   . Heart attack Neg Hx     Social History   Tobacco Use  . Smoking status: Former Smoker    Packs/day: 0.50    Years: 15.00    Pack years: 7.50    Types: Cigarettes    Quit date: 07/13/2015    Years since quitting: 4.9  . Smokeless tobacco: Never Used  Vaping Use  . Vaping Use: Never used  Substance Use Topics  . Alcohol use: No    Alcohol/week: 0.0 standard drinks  . Drug use: No    Home Medications Prior to Admission medications   Medication Sig Start Date End Date Taking? Authorizing Provider  cephALEXin (KEFLEX) 500 MG capsule Take 1 capsule (500 mg total) by mouth 2 (two) times daily for 5 days. 06/20/20 06/25/20 Yes , Carola Rhine, MD  allopurinol (ZYLOPRIM) 300 MG tablet Take 300 mg by mouth daily after breakfast.  04/26/16   [provider]  atorvastatin (LIPITOR) 10 MG tablet TAKE 1 TABLET BY MOUTH DAILY AT 6 PM 04/09/20   Jettie Booze,  MD  blood glucose meter kit and supplies KIT Dispense based on patient and insurance preference. Use up to four times daily as directed. (FOR ICD-9 250.00, 250.01). 04/10/19   Elodia Florence., MD  COENZYME Q10 PO Take 1 tablet by mouth daily.     [provider]  GLUCOSAMINE-CHONDROITIN PO Take 1 tablet by mouth daily.     [provider]  metoprolol tartrate (LOPRESSOR) 25 MG tablet Take 1 tablet (25 mg total) by mouth 2 (two) times daily. 10/21/19   Jettie Booze, MD  Multiple Vitamin (MULTIVITAMIN) tablet Take 1 tablet by mouth daily.    [provider]   Omega-3 Fatty Acids (FISH OIL PO) Take 2 tablets by mouth daily.    [provider]  PROLIA 60 MG/ML SOSY injection Inject into the skin. 05/07/20   [provider]  STIOLTO RESPIMAT 2.5-2.5 MCG/ACT AERS Inhale 2 puffs into the lungs daily. 10/13/19   [provider]  XARELTO 20 MG TABS tablet TAKE 1 TABLET(20 MG) BY MOUTH DAILY 01/12/20   Jettie Booze, MD    Allergies    Cyclobenzaprine and Indomethacin  Review of Systems   Review of Systems  Constitutional: Negative for chills and fever.  Respiratory: Negative for cough and shortness of breath.   Cardiovascular: Negative for chest pain and palpitations.  Skin: Positive for rash and wound.  Neurological: Negative for weakness and light-headedness.  Psychiatric/Behavioral: Negative for agitation and confusion.  All other systems reviewed and are negative.   Physical Exam Updated Vital Signs BP (!) 143/88 (BP Location: Right Arm)   Pulse 82   Temp 98.9 F (37.2 C)   Resp 16   SpO2 99%   Physical Exam Constitutional:      General: He is not in acute distress. HENT:     Head: Normocephalic and atraumatic.  Eyes:     Conjunctiva/sclera: Conjunctivae normal.     Pupils: Pupils are equal, round, and reactive to light.  Cardiovascular:     Rate and Rhythm: Normal rate and regular rhythm.  Pulmonary:     Effort: Pulmonary effort is normal. No respiratory distress.  Skin:    General: Skin is warm and dry.     Comments: Laceration linear to fingerpad of left 3rd digit Patient able to fully flex and extent digit No nailbed involvement Affected finger not held in flexion. No swelling or tenderness over tendon. No fusiform swelling of finger No pain with passive extension.   Neurological:     General: No focal deficit present.     Mental Status: He is alert. Mental status is at baseline.  Psychiatric:        Mood and Affect: Mood normal.        Behavior: Behavior normal.     ED Results  / Procedures / Treatments   Labs (all labs ordered are listed, but only abnormal results are displayed) Labs Reviewed - No data to display  EKG None  Radiology No results found.  Procedures .Marland KitchenLaceration Repair  Date/Time: 06/20/2020 5:52 PM Performed by: Wyvonnia Dusky, MD Authorized by: Wyvonnia Dusky, MD   Consent:    Consent obtained:  Verbal   Consent given by:  Patient   Risks discussed:  Infection, need for additional repair and poor cosmetic result   Alternatives discussed:  No treatment Universal protocol:    Procedure explained and questions answered to patient or proxy's satisfaction: no     Relevant documents present and verified:  no     Immediately prior to procedure, a time out was called: no     Patient identity confirmed:  Arm band Anesthesia:    Anesthesia method:  Nerve block   Block needle gauge:  27 G   Block anesthetic:  Lidocaine 1% w/o epi   Block injection procedure:  Anatomic landmarks identified, anatomic landmarks palpated, introduced needle, negative aspiration for blood and incremental injection   Block outcome:  Anesthesia achieved Laceration details:    Location:  Finger   Finger location:  L long finger   Length (cm):  2   Depth (mm):  2 Pre-procedure details:    Preparation:  Patient was prepped and draped in usual sterile fashion Exploration:    Imaging outcome: foreign body not noted     Wound exploration: wound explored through full range of motion and entire depth of wound visualized     Contaminated: no   Treatment:    Amount of cleaning:  Standard   Irrigation solution:  Sterile saline   Debridement:  None   Undermining:  None   Scar revision: no   Skin repair:    Repair method:  Sutures   Suture size:  4-0   Suture material:  Prolene   Number of sutures:  5 Approximation:    Approximation:  Close Repair type:    Repair type:  Simple Post-procedure details:    Dressing:  Non-adherent dressing   Procedure  completion:  Tolerated well, no immediate complications     Medications Ordered in ED Medications  lidocaine (PF) (XYLOCAINE) 1 % injection (has no administration in time range)  Tdap (BOOSTRIX) injection 0.5 mL (has no administration in time range)    ED Course  I have reviewed the triage vital signs and the nursing notes.  Pertinent labs & imaging results that were available during my care of the patient were reviewed by me and considered in my medical decision making (see chart for details).  Update tetanus here Laceration repaired after irrigation of wound No foreign bodies noted No evidence of flexor tenosynovitis No evidence of tendon rupture or injury, full ROM of the digit  Bleeding controlled after sutures placed  Okay to d/c     Final Clinical Impression(s) / ED Diagnoses Final diagnoses:  Laceration of left middle finger without foreign body without damage to nail, initial encounter    Rx / DC Orders ED Discharge Orders         Ordered    cephALEXin (KEFLEX) 500 MG capsule  2 times daily        06/20/20 1750           Wyvonnia Dusky, MD 06/20/20 1754

## 2020-06-20 NOTE — Discharge Instructions (Addendum)
Keep your wound dry for 48 hours, then you can wash your hands normally.  Don't put ointment or peroxide or scrub at your wound.  The stitches should be removed in 7-10 days.  Your PCP or an urgent care can remove these. There were 5 stitches placed.  This may ooze blood for the next 24 hours.  Just hold pressure over the wound firmly if needed for 5-10 minutes with the gauze we gave you.    If you notice new redness spreading down your finger, pus coming from your wound, or swelling of your whole finger, start taking the Keflex antibiotic and come back for an evaluation.

## 2020-07-08 ENCOUNTER — Other Ambulatory Visit: Payer: Self-pay | Admitting: Interventional Cardiology

## 2020-07-08 NOTE — Telephone Encounter (Signed)
Prescription refill request for Xarelto received.  Indication: A Fib Last office visit: 10/21/19 Weight: 86.2kg Age: 75 Scr: 1.26 on 11/27/19 CrCl: 61.76  Based on above findings Xarelto 20mg  daily is the appropriate dose.  Refill approved.

## 2020-07-09 NOTE — Telephone Encounter (Signed)
Pt last saw Dr Eldridge Dace 10/21/19, last labs 11/27/19 Creat 1.26, age 75, weight 87.5kg, CrCl 62.69, based on CrCl pt is on appropriate dosage of Xarelto 20mg  QD.  Will refill rx.

## 2020-10-06 ENCOUNTER — Other Ambulatory Visit: Payer: Self-pay | Admitting: Interventional Cardiology

## 2020-10-13 NOTE — Progress Notes (Signed)
Cardiology Office Note   Date:  10/15/2020   ID:  Jonathon Stevens, DOB [birth year], MRN [id number]  PCP:  Stephens Shire, MD    No chief complaint on file.  AFib  Wt Readings from Last 3 Encounters:  10/15/20 189 lb 9.6 oz (86 kg)  05/31/20 193 lb (87.5 kg)  10/21/19 190 lb (86.2 kg)       History of Present Illness: Jonathon Stevens is a 75 y.o. male  who had a CVA.  He had AFib dagnosed in the hospital at that time.  He was started on anticoagulation with Xarelto at that time.   Much of the neurologic symptoms that he had at the time of his CVA have resolved.   Of note, there was a question of a mildly decreased left ventricular ejection fraction. The TEE on the following day in the hospital showed normal left ventricular ejection fraction.  He did not have any ischemia evaluation due to lack of symptoms and resolution of LV dysfunction. This may have been tachycardia mediated.     Since the last visit, he did have COVID in early Jan.  Records showed: "Patient presented to ED with c/o generalized weakness, malaise, mild diarrhea.  No N/V, no SOB nor cough.  No known COVID exposures.   He was admitted for COVID pneumonia in Jan 2021.  He's improved with steroids, remdesivir, and actemra as well as antibiotics.  Metoprolol was uptitrated for RVR during hospitalization.  Doing well on RA at discharge.   See below   Hospital Course:  Acute Hypoxic Respiratory Failure 2/2 COVID 19 Pneumonia S/p remdesivir, dex S/p ceftriaxone x 5 days S/p actemra on 1/6 CRP improved today I/O, daily weights IS, prone as able, OOB Atrial Fibrillation with RVR: increase metop to 100 mg BID, follow response.  Continue xarelto.  Continue, follow outpatient.     Acute Metabolic Encephalopathy: With visual hallucinations here CT head without acute findings Likely related to acute illness, hospitalization, and steroids Continue seroquel for now until discharge Improved    Transaminitis Due to COVID-19.  Has improved.  Will need follow-up in the outpatient setting.   Hyponatremia Improved.  Continue to monitor..   Diabetes HbA1c 6.6.  Elevated CBGs most likely due to steroids.  Patient was started on Lantus.  SSI being continued. Needs outpatient follow up for diabetes.   Gout Continue allopurinol.   History of stroke On anticoagulation for stroke prevention.  PT and OT evaluation.  Home health recommended with supervision  Staph epidermidis UTI: likely contaminant"   He was sent home on 100 mg Metoprolol BID.  Mild dizziness after taking the higher dose.  Was on 12.5 mg before COVID).   He had post COVID fatigue.  He has also been vaccinated.    06/2019 CT scan:  "Sub-6 mm pulmonary nodules in the left upper lobe. Consider follow-up evaluation with CT chest in 12 months to assess for stability.  2.  Background of advanced destructive centrilobular and substantial paraseptal emphysema.  3.  Bilateral lower lobe predominant subpleural reticulations with interlobular septal thickening and traction bronchiectasis, favoring fibrotic interstitial lung disease. CT features most consistent with "indeterminate" pattern of usual interstitial pneumonia according to Diagnostic criteria for idiopathic pulmonary fibrosis: A Fleischner Society white paper. Lancet RespiratoryMedicine 2018; M4241847. Consider pulmonology consult."    Saw pulmonary at [facility]. He was given an inhaler but it made no difference.  Fatigue has persisted.  Swims for exercise, but endurance has decreased.  Denies : Chest pain. Leg edema. Nitroglycerin use. Orthopnea. Palpitations. Paroxysmal nocturnal dyspnea.  Syncope.    Rare lightheadedness- lasts a few a seconds.  Sometimes takes an extra metoprolol at night, not for any specific symptom.      Past Medical History:  Diagnosis Date   Atrial flutter (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 ECHO 10/08/13: EF 45-50%, mild hypokinesis of apical myocardium. Mild LA dilation, mild RA dilation;  b.  TEE (10/09/13):  EF 50-55%, normal wall motion, no LAA clot   Hypertension    Stroke [facility])     Past Surgical History:  Procedure Laterality Date   TEE WITHOUT CARDIOVERSION N/A 10/09/2013   Procedure: TRANSESOPHAGEAL ECHOCARDIOGRAM (TEE);  Surgeon: Pixie Casino, MD;  Location: [facility] ENDOSCOPY;  Service: Cardiovascular;  Laterality: N/A;     Current Outpatient Medications  Medication Sig Dispense Refill   allopurinol (ZYLOPRIM) 300 MG tablet Take 300 mg by mouth daily after breakfast.      atorvastatin (LIPITOR) 10 MG tablet TAKE 1 TABLET BY MOUTH DAILY AT 6 PM 90 tablet 0   blood glucose meter kit and supplies KIT Dispense based on patient and insurance preference. Use up to four times daily as directed. (FOR ICD-9 250.00, 250.01). 1 each 0   COENZYME Q10 PO Take 1 tablet by mouth daily.      GLUCOSAMINE-CHONDROITIN PO Take 1 tablet by mouth daily.      metoprolol tartrate (LOPRESSOR) 25 MG tablet TAKE 1 TABLET(25 MG) BY MOUTH TWICE DAILY 180 tablet 0   Multiple Vitamin (MULTIVITAMIN) tablet Take 1 tablet by mouth daily.     Omega-3 Fatty Acids (FISH OIL PO) Take 2 tablets by mouth daily.     PROLIA 60 MG/ML SOSY injection Inject into the skin.     STIOLTO RESPIMAT 2.5-2.5 MCG/ACT AERS Inhale 2 puffs into the lungs daily.     XARELTO 20 MG TABS tablet TAKE 1 TABLET(20 MG) BY MOUTH DAILY 90 tablet 1   No current facility-administered medications for this visit.    Allergies:   Cyclobenzaprine and Indomethacin    Social History:  The patient  reports that he quit smoking about 5 years ago. His smoking use included cigarettes. He has a 7.50 pack-year smoking history. He has never used smokeless tobacco. He reports that he does not drink alcohol and does not use drugs.   Family History:  The  patient's family history includes Hypertension in his father and mother; Stroke in his mother.    ROS:  Please see the history of present illness.   Otherwise, review of systems are positive for fatigue.   All other systems are reviewed and negative.    PHYSICAL EXAM: VS:  BP 128/84   Pulse 94   Ht 5' 10.54" (1.792 m)   Wt 189 lb 9.6 oz (86 kg)   SpO2 97%   BMI 26.79 kg/m  , BMI Body mass index is 26.79 kg/m. GEN: Well nourished, well developed, in no acute distress HEENT: normal Neck: no JVD, carotid bruits, or masses Cardiac: irregularly irregular; no murmurs, rubs, or gallops,no edema  Respiratory:  clear to auscultation bilaterally, normal work of breathing GI: soft, nontender, nondistended, + BS MS: no deformity or atrophy Skin: warm and dry, no rash Neuro:  Strength and sensation are intact  Psych: euthymic mood, full affect   EKG:   The ekg ordered today demonstrates AFib, controlled rate   Recent Labs: No results found for requested labs within last 8760 hours.   Lipid Panel    Component Value Date/Time   CHOL 175 10/08/2013 0241   TRIG 46 03/30/2019 2146   HDL 41 10/08/2013 0241   CHOLHDL 4.3 10/08/2013 0241   VLDL 23 10/08/2013 0241   LDLCALC 111 (H) 10/08/2013 0241     Other studies Reviewed: Additional studies/ records that were reviewed today with results demonstrating: labs from [facility] reviewed.   ASSESSMENT AND PLAN:  AFib/flutter: Rate controlled. Xarelto for stroke prevention.  HR at home can be as low as 58 and up to 90-100.  He felt worse on the metoprolol 50 mg BID dose.  Feels better on lower dose.  Hyperlipidemia: LDL 68 in 9/21.  To be rechecked in 11/2020.  COntinue atorvastatin.  Cardiomyopathy: Appears euvolemic. Continue regular exercise to maintain stamina.  Anticoagulated: No internal bleeding.  Needs CBC at blood test with PMD. Rx given.  Emphysema: Noted by CT in 2021, along with pulm fibrosis.  Contributes to DOE.   Prediabetic: Whole food, plant based diet.   A1C 6.6 in 2021.    Current medicines are reviewed at length with the patient today.  The patient concerns regarding his medicines were addressed.  The following changes have been made:  No change  Labs/ tests ordered today include:  No orders of the defined types were placed in this encounter.   Recommend 150 minutes/week of aerobic exercise Low fat, low carb, high fiber diet recommended  Disposition:   FU in 1 year   Signed, Larae Grooms, MD  10/15/2020 9:21 AM    [facility] [address], [place], [place]  [address] Phone: [phone]; Fax: [phone]

## 2020-10-15 ENCOUNTER — Encounter: Payer: Self-pay | Admitting: Interventional Cardiology

## 2020-10-15 ENCOUNTER — Other Ambulatory Visit: Payer: Self-pay

## 2020-10-15 ENCOUNTER — Telehealth: Payer: Self-pay

## 2020-10-15 ENCOUNTER — Ambulatory Visit: Payer: Medicare Other | Admitting: Interventional Cardiology

## 2020-10-15 VITALS — BP 128/84 | HR 94 | Ht 70.54 in | Wt 189.6 lb

## 2020-10-15 DIAGNOSIS — I714 Abdominal aortic aneurysm, without rupture, unspecified: Secondary | ICD-10-CM

## 2020-10-15 DIAGNOSIS — Z7901 Long term (current) use of anticoagulants: Secondary | ICD-10-CM

## 2020-10-15 DIAGNOSIS — E782 Mixed hyperlipidemia: Secondary | ICD-10-CM

## 2020-10-15 DIAGNOSIS — I723 Aneurysm of iliac artery: Secondary | ICD-10-CM

## 2020-10-15 DIAGNOSIS — R7303 Prediabetes: Secondary | ICD-10-CM

## 2020-10-15 DIAGNOSIS — I4821 Permanent atrial fibrillation: Secondary | ICD-10-CM

## 2020-10-15 NOTE — Patient Instructions (Signed)
Medication Instructions:  Your physician recommends that you continue on your current medications as directed. Please refer to the Current Medication list given to you today.  *If you need a refill on your cardiac medications before your next appointment, please call your pharmacy*   Lab Work: Have CBC done at next primary care visit.  You have prescription for this If you have labs (blood work) drawn today and your tests are completely normal, you will receive your results only by: MyChart Message (if you have MyChart) OR A paper copy in the mail If you have any lab test that is abnormal or we need to change your treatment, we will call you to review the results.   Testing/Procedures: none   Follow-Up: At [facility], you and your health needs are our priority.  As part of our continuing mission to provide you with exceptional heart care, we have created designated Provider Care Teams.  These Care Teams include your primary Cardiologist (physician) and Advanced Practice Providers (APPs -  Physician Assistants and Nurse Practitioners) who all work together to provide you with the care you need, when you need it.  We recommend signing up for the patient portal called "MyChart".  Sign up information is provided on this After Visit Summary.  MyChart is used to connect with patients for Virtual Visits (Telemedicine).  Patients are able to view lab/test results, encounter notes, upcoming appointments, etc.  Non-urgent messages can be sent to your provider as well.   To learn more about what you can do with MyChart, go to [email or web address].    Your next appointment:   12 month(s)  The format for your next appointment:   In Person  Provider:   You may see Lance Muss, MD or one of the following Advanced Practice Providers on your designated Care Team:   Ronie Spies, PA-C Jacolyn Reedy, PA-C   Other Instructions Please call [name] and [name] Patient [name]  to see if you may qualify for Xarelto assistance.  Phone number is [phone]

## 2020-10-15 NOTE — Telephone Encounter (Signed)
**Note De-Identified  Obfuscation** The pt advised Dr Eldridge Dace that his Xarelto has gone up to $435/90 day supply.  Per Dr Hoyle Barr request I called the pt to discuss options to lower his Xarelto cost.  First, we discussed pt asst through [employer]. I gave him their phone number and advised him to call them with questions concerning their Xarelto program and his eligibility to be approved for their Xarelto program. He is aware to request that they mail him an application to his home or to print one from their web site at [employer] pt asst foundation (pt asst application). He is aware that if approved he will receive it from them free of charge for the rest of the year.  We also discussed Sales promotion account executive as they offer Xarelto at $85/30 day supply or $240/90 day supply. He is very interested in this program as there are no requirements. I gave him their phone number so he can call them with questions and/or to get signed up for the program.  He thanked me for calling him and he is aware to call Larita Fife a Dr Hoyle Barr office at [facility] if he has any further questions or concerns about his Xarelto cost.

## 2021-01-04 ENCOUNTER — Other Ambulatory Visit: Payer: Self-pay | Admitting: Interventional Cardiology

## 2021-01-07 ENCOUNTER — Other Ambulatory Visit: Payer: Self-pay | Admitting: *Deleted

## 2021-01-07 MED ORDER — RIVAROXABAN 20 MG PO TABS: ORAL_TABLET | ORAL | 1 refills | Status: DC

## 2021-01-07 NOTE — Telephone Encounter (Signed)
Xarelto 20mg  paper refill request received. Pt is 75 years old, weight-86kg, Crea-1.30 on 11/30/2020 via [facility] from [facility], last seen by [facility]. [facility] on 10/15/2020, Diagnosis-Afib, CrCl-59.108ml/min; Dose is appropriate based on dosing criteria. Will send in refill to requested pharmacy.

## 2021-01-10 ENCOUNTER — Ambulatory Visit: Payer: Medicare Other | Admitting: Physician Assistant

## 2021-01-10 ENCOUNTER — Other Ambulatory Visit: Payer: Self-pay

## 2021-01-10 ENCOUNTER — Ambulatory Visit (HOSPITAL_COMMUNITY)
Admission: RE | Admit: 2021-01-10 | Discharge: 2021-01-10 | Disposition: A | Discharge status: Home / Self Care | Payer: Medicare Other | Source: Ambulatory Visit | Attending: Vascular Surgery | Admitting: Vascular Surgery

## 2021-01-10 VITALS — BP 146/71 | HR 55 | Temp 97.7°F | Resp 16 | Ht 70.0 in | Wt 191.0 lb

## 2021-01-10 DIAGNOSIS — I7143 Infrarenal abdominal aortic aneurysm, without rupture: Secondary | ICD-10-CM

## 2021-01-10 DIAGNOSIS — I714 Abdominal aortic aneurysm, without rupture, unspecified: Secondary | ICD-10-CM | POA: Diagnosis present

## 2021-01-10 DIAGNOSIS — I723 Aneurysm of iliac artery: Secondary | ICD-10-CM | POA: Diagnosis not present

## 2021-01-10 NOTE — Progress Notes (Signed)
Office Note     CC:  follow up Requesting Provider:  Stephens Shire, MD  HPI: Jonathon Stevens is a 75 y.o. (1945-11-01) male who presents for 40-monthfollow-up of abdominal aortic aneurysm and right common iliac artery aneurysm.  The patient was evaluated by Dr. CDonzetta Mattersin March of this year.  He denies episodes of significant abdominal or back pain although he does have history of 2 vertebral disc compression fractures.  He continues to exercise and swim as much as possible although he has significant chronic fatigue from COVID-19 infection.  He denies claudication but gets easily fatigued which limits his activity.  Patient's past medical history is significant for paroxysmal atrial fibrillation and he is maintained on rivaroxaban.  He is compliant with daily statin.  Past Medical History:  Diagnosis Date   [place]    CVA (cerebral infarction)    embolic CVA 76/0156  Diabetes mellitus without complication (HCC)    Gout    HLD (hyperlipidemia)    Hx of echocardiogram    a. ECHO 10/08/13: EF 45-50%, mild hypokinesis of apical myocardium. Mild LA dilation, mild RA dilation;  b.  TEE (10/09/13):  EF 50-55%, normal wall motion, no LAA clot   Hypertension    Stroke [facility]     Past Surgical History:  Procedure Laterality Date   TEE WITHOUT CARDIOVERSION N/A 10/09/2013   Procedure: TRANSESOPHAGEAL ECHOCARDIOGRAM (TEE);  Surgeon: KPixie Casino MD;  [facility];  Service: Cardiovascular;  Laterality: N/A;    Social History   Socioeconomic History   Marital status: Unknown    Spouse name: KJuliann Pulse  Number of children: 2   Years of education: college   Highest education level: Not on file  Occupational History   Occupation: retired   Tobacco Use   Smoking status: Former    Packs/day: 0.50    Years: 15.00    Pack years: 7.50    Types: Cigarettes    Quit date: 07/13/2015    Years since quitting: 5.5   Smokeless tobacco: Never  Vaping Use   Vaping Use: Never  used  Substance and Sexual Activity   Alcohol use: No    Alcohol/week: 0.0 standard drinks   Drug use: No   Sexual activity: Yes  Other Topics Concern   Not on file  Social History Narrative   Patient is married lives with Spouse ([name].   Patient is right handed.   Patient has a CGaffer  Patient drinks 1 cup of coffee daily   Social Determinants of Health   Financial Resource Strain: Not on file  Food Insecurity: Not on file  Transportation Needs: Not on file  Physical Activity: Not on file  Stress: Not on file  Social Connections: Not on file  Intimate Partner Violence: Not on file   Family History  Problem Relation Age of Onset   Hypertension Mother    Stroke Mother    Hypertension Father    Heart attack Neg Hx     Current Outpatient Medications  Medication Sig Dispense Refill   allopurinol (ZYLOPRIM) 300 MG tablet Take 300 mg by mouth daily after breakfast.      atorvastatin (LIPITOR) 10 MG tablet TAKE 1 TABLET BY MOUTH DAILY AT 6 PM 90 tablet 2   blood glucose meter kit and supplies KIT Dispense based on patient and insurance preference. Use up to four times daily as directed. (FOR ICD-9 250.00, 250.01). 1 each 0   COENZYME Q10  PO Take 1 tablet by mouth daily.      GLUCOSAMINE-CHONDROITIN PO Take 1 tablet by mouth daily.      metoprolol tartrate (LOPRESSOR) 25 MG tablet TAKE 1 TABLET(25 MG) BY MOUTH TWICE DAILY 180 tablet 2   Multiple Vitamin (MULTIVITAMIN) tablet Take 1 tablet by mouth daily.     Omega-3 Fatty Acids (FISH OIL PO) Take 2 tablets by mouth daily.     PROLIA 60 MG/ML SOSY injection Inject into the skin.     rivaroxaban (XARELTO) 20 MG TABS tablet TAKE 1 TABLET(20 MG) BY MOUTH DAILY 90 tablet 1   STIOLTO RESPIMAT 2.5-2.5 MCG/ACT AERS Inhale 2 puffs into the lungs daily.     No current facility-administered medications for this visit.    Allergies  Allergen Reactions   Cyclobenzaprine Nausea Only   Indomethacin Rash     REVIEW OF  SYSTEMS:   _0  denotes positive finding, _1  denotes negative finding Cardiac  Comments:  Chest pain or chest pressure:    Shortness of breath upon exertion: x   Short of breath when lying flat:    Irregular heart rhythm:        Vascular    Pain in calf, thigh, or hip brought on by ambulation:    Pain in feet at night that wakes you up from your sleep:     Blood clot in your veins:    Leg swelling:         Pulmonary    Oxygen at home:    Productive cough:     Wheezing:         Neurologic    Sudden weakness in arms or legs:     Sudden numbness in arms or legs:     Sudden onset of difficulty speaking or slurred speech:    Temporary loss of vision in one eye:     Problems with dizziness:         Gastrointestinal    Blood in stool:     Vomited blood:         Genitourinary    Burning when urinating:     Blood in urine:        Psychiatric    Major depression:         Hematologic    Bleeding problems:    Problems with blood clotting too easily:        Skin    Rashes or ulcers:        Constitutional    Fever or chills:      PHYSICAL EXAMINATION:  Vitals:   01/10/21 0914  BP: (!) 146/71  Pulse: (!) 55  Resp: 16  Temp: 97.7 F (36.5 C)  SpO2: 98%     General:  WDWN in NAD; vital signs documented above Gait: unaided, no ataxia HENT: WNL, normocephalic Pulmonary: normal non-labored breathing , without Rales, rhonchi,  wheezing Cardiac: regular HR, without  Murmurs Abdomen: soft, NT, no masses Skin: without rashes Vascular Exam/Pulses: 2+ DP, PT, femoral artery pulses bilaterally Extremities: without ischemic changes, without Gangrene , without cellulitis; without open wounds. Musculoskeletal: no muscle wasting or atrophy  Neurologic: A&O X 3;  No focal weakness or paresthesias are detected Psychiatric:  The pt has Normal affect.   Non-Invasive Vascular Imaging:   01/10/2021 Summary:  Abdominal Aorta: There is evidence of abnormal dilation of the Right   Common Iliac artery. Prior measurement 3.1 x 3.2 cm . The largest aortic  diameter remains essentially unchanged compared  to prior exam. Previous  diameter measurement was 4.3 cm obtained   on 05/31/2020.     *See table(s) above for measurements and observations.   Maximal aortic diameter: 4.38 cm. Right CIA 3.4 cm    Preliminary    Prior measurements in March: small aortic aneurysm of 4.3 cm and right common iliac artery aneurysm of 3.2 cm.  ASSESSMENT/PLAN:: 75 y.o. male here for follow up for AAA and right CIA aneurysm. Two  millimeter estimated increase in size in right CIA aneurysm. Essentially no change in maximal aortic diameter. No symptoms referable to AAA and no LE symptoms.  Advised to seek emergent medical attention should he develop sudden back or abdominal pain.  Barbie Banner, PA-C [facility] [phone]  Clinic MD:   Dr. Virl Cagey

## 2021-01-14 ENCOUNTER — Other Ambulatory Visit: Payer: Self-pay

## 2021-01-14 DIAGNOSIS — I714 Abdominal aortic aneurysm, without rupture, unspecified: Secondary | ICD-10-CM

## 2021-01-14 DIAGNOSIS — I723 Aneurysm of iliac artery: Secondary | ICD-10-CM

## 2021-02-12 ENCOUNTER — Emergency Department (HOSPITAL_COMMUNITY): Payer: Medicare Other

## 2021-02-12 ENCOUNTER — Encounter (HOSPITAL_COMMUNITY): Payer: Self-pay | Admitting: *Deleted

## 2021-02-12 ENCOUNTER — Other Ambulatory Visit: Payer: Self-pay

## 2021-02-12 ENCOUNTER — Emergency Department (HOSPITAL_COMMUNITY)
Admission: EM | Admit: 2021-02-12 | Discharge: 2021-02-12 | Disposition: A | Discharge status: Home / Self Care | Payer: Medicare Other | Source: Home / Self Care | Attending: Emergency Medicine | Admitting: Emergency Medicine

## 2021-02-12 DIAGNOSIS — D500 Iron deficiency anemia secondary to blood loss (chronic): Secondary | ICD-10-CM | POA: Diagnosis not present

## 2021-02-12 DIAGNOSIS — Z7901 Long term (current) use of anticoagulants: Secondary | ICD-10-CM | POA: Insufficient documentation

## 2021-02-12 DIAGNOSIS — I10 Essential (primary) hypertension: Secondary | ICD-10-CM | POA: Insufficient documentation

## 2021-02-12 DIAGNOSIS — R61 Generalized hyperhidrosis: Secondary | ICD-10-CM | POA: Insufficient documentation

## 2021-02-12 DIAGNOSIS — Z79899 Other long term (current) drug therapy: Secondary | ICD-10-CM | POA: Insufficient documentation

## 2021-02-12 DIAGNOSIS — E119 Type 2 diabetes mellitus without complications: Secondary | ICD-10-CM | POA: Insufficient documentation

## 2021-02-12 DIAGNOSIS — I4891 Unspecified atrial fibrillation: Secondary | ICD-10-CM

## 2021-02-12 DIAGNOSIS — Z8616 Personal history of COVID-19: Secondary | ICD-10-CM | POA: Insufficient documentation

## 2021-02-12 DIAGNOSIS — Z20822 Contact with and (suspected) exposure to covid-19: Secondary | ICD-10-CM | POA: Insufficient documentation

## 2021-02-12 DIAGNOSIS — R197 Diarrhea, unspecified: Secondary | ICD-10-CM | POA: Insufficient documentation

## 2021-02-12 DIAGNOSIS — K2101 Gastro-esophageal reflux disease with esophagitis, with bleeding: Secondary | ICD-10-CM | POA: Diagnosis not present

## 2021-02-12 DIAGNOSIS — Z87891 Personal history of nicotine dependence: Secondary | ICD-10-CM | POA: Insufficient documentation

## 2021-02-12 LAB — APTT: aPTT: 35 seconds (ref 24–36)

## 2021-02-12 LAB — BASIC METABOLIC PANEL
Anion gap: 10 (ref 5–15)
BUN: 45 mg/dL — ABNORMAL HIGH (ref 8–23)
CO2: 21 mmol/L — ABNORMAL LOW (ref 22–32)
Calcium: 8.7 mg/dL — ABNORMAL LOW (ref 8.9–10.3)
Chloride: 105 mmol/L (ref 98–111)
Creatinine, Ser: 1.36 mg/dL — ABNORMAL HIGH (ref 0.61–1.24)
GFR, Estimated: 54 mL/min — ABNORMAL LOW (ref >60)
Glucose, Bld: 197 mg/dL — ABNORMAL HIGH (ref 70–99)
Potassium: 4.9 mmol/L (ref 3.5–5.1)
Sodium: 136 mmol/L (ref 135–145)

## 2021-02-12 LAB — CBC
HCT: 36.7 % — ABNORMAL LOW (ref 39.0–52.0)
Hemoglobin: 12.5 g/dL — ABNORMAL LOW (ref 13.0–17.0)
MCH: 30.9 pg (ref 26.0–34.0)
MCHC: 34.1 g/dL (ref 30.0–36.0)
MCV: 90.6 fL (ref 80.0–100.0)
Platelets: 166 10*3/uL (ref 150–400)
RBC: 4.05 MIL/uL — ABNORMAL LOW (ref 4.22–5.81)
RDW: 12.8 % (ref 11.5–15.5)
WBC: 10.2 10*3/uL (ref 4.0–10.5)
nRBC: 0 % (ref 0.0–0.2)

## 2021-02-12 LAB — RESP PANEL BY RT-PCR (FLU A&B, COVID) ARPGX2
Influenza A by PCR: NEGATIVE
Influenza B by PCR: NEGATIVE
SARS Coronavirus 2 by RT PCR: NEGATIVE

## 2021-02-12 LAB — PROTIME-INR
INR: 2.1 — ABNORMAL HIGH (ref 0.8–1.2)
Prothrombin Time: 23.1 seconds — ABNORMAL HIGH (ref 11.4–15.2)

## 2021-02-12 LAB — MAGNESIUM: Magnesium: 2 mg/dL (ref 1.7–2.4)

## 2021-02-12 LAB — BRAIN NATRIURETIC PEPTIDE: B Natriuretic Peptide: 45.4 pg/mL (ref 0.0–100.0)

## 2021-02-12 MED ORDER — SODIUM CHLORIDE 0.9 % IV BOLUS
500.0000 mL | Freq: Once | INTRAVENOUS | Status: AC
  Administered 2021-02-12: 500 mL via INTRAVENOUS

## 2021-02-12 NOTE — ED Provider Notes (Signed)
[facility] EMERGENCY DEPARTMENT Provider Note   CSN: [id number] Arrival date & time: 02/12/21  2020     History Chief Complaint  Patient presents with   Dizziness    Jonathon Stevens is a 75 y.o. male presenting for evaluation of dizziness.  Patient states he felt well this morning.  However this afternoon, he started to feel more tired than normal.  He then felt like he was going to pass out, had an episode of diarrhea, and got very sweaty.  Upon EMS arrival, patient was in A. fib with RVR, given Cardizem.  Patient states since then, he has felt well.  He reports no dizziness, lightheadedness, sweatiness at this time.  He denies recent fever, cough, chest pain, shortness of breath, nausea, vomiting, abdominal pain, urinary symptoms.  No blood in his stools.  He is on Xarelto, has been taking it as prescribed.  He states he often forgets his metoprolol, he did not have it today.  Additional history taken chart reviewed.  Patient with a history of a flutter, a fib, CVA, diabetes, HLD, hypertension  HPI     Past Medical History:  Diagnosis Date   Atrial flutter ([place])    CVA (cerebral infarction)    embolic CVA 10/2954   Diabetes mellitus without complication (HCC)    Gout    HLD (hyperlipidemia)    Hx of echocardiogram    a. ECHO 10/08/13: EF 45-50%, mild hypokinesis of apical myocardium. Mild LA dilation, mild RA dilation;  b.  TEE (10/09/13):  EF 50-55%, normal wall motion, no LAA clot   Hypertension    Stroke [facility])     Patient Active Problem List   Diagnosis Date Noted   Aortic aneurysm without rupture ([place]) 04/06/2020   Diarrhea 03/11/2020   Gastro-esophageal reflux disease without esophagitis 03/11/2020   Lumbar compression fracture (HCC) 06/16/2019   Atrial fibrillation with RVR ([place]) 03/30/2019   COVID-19 virus infection 03/30/2019   Colon cancer screening 11/16/2017   Nontraumatic epidural hematoma ([place]) 11/16/2017   Sciatica of right side  10/12/2017   Chronic obstructive pulmonary disease with acute exacerbation ([place]) 08/27/2017   History of cardioembolic stroke 21/30/8657   Anticoagulated 07/13/2016   Erectile dysfunction 06/22/2015   Impaired fasting glucose 06/22/2015   Primary gout 06/22/2015   Paroxysmal atrial fibrillation ([place]) 11/05/2014   Abnormal electrocardiogram 02/02/2014   Stroke ([place]) 11/19/2013   HLD (hyperlipidemia) 10/27/2013   Prediabetes 10/10/2013   Atrial flutter ([place])    Cerebral embolism with cerebral infarction ([place]) 10/07/2013    Past Surgical History:  Procedure Laterality Date   TEE WITHOUT CARDIOVERSION N/A 10/09/2013   Procedure: TRANSESOPHAGEAL ECHOCARDIOGRAM (TEE);  Surgeon: Pixie Casino, MD;  Location: [facility] ENDOSCOPY;  Service: Cardiovascular;  Laterality: N/A;       Family History  Problem Relation Age of Onset   Hypertension Mother    Stroke Mother    Hypertension Father    Heart attack Neg Hx     Social History   Tobacco Use   Smoking status: Former    Packs/day: 0.50    Years: 15.00    Pack years: 7.50    Types: Cigarettes    Quit date: 07/13/2015    Years since quitting: 5.5   Smokeless tobacco: Never  Vaping Use   Vaping Use: Never used  Substance Use Topics   Alcohol use: No    Alcohol/week: 0.0 standard drinks   Drug use: No    Home Medications Prior to Admission  medications   Medication Sig Start Date End Date Taking? Authorizing Provider  allopurinol (ZYLOPRIM) 300 MG tablet Take 300 mg by mouth daily after breakfast.  04/26/16   [provider]  atorvastatin (LIPITOR) 10 MG tablet TAKE 1 TABLET BY MOUTH DAILY AT 6 PM 01/04/21   Jettie Booze, MD  blood glucose meter kit and supplies KIT Dispense based on patient and insurance preference. Use up to four times daily as directed. (FOR ICD-9 250.00, 250.01). 04/10/19   Elodia Florence., MD  COENZYME Q10 PO Take 1 tablet by mouth daily.     [provider]   GLUCOSAMINE-CHONDROITIN PO Take 1 tablet by mouth daily.     [provider]  metoprolol tartrate (LOPRESSOR) 25 MG tablet TAKE 1 TABLET(25 MG) BY MOUTH TWICE DAILY 01/04/21   Jettie Booze, MD  Multiple Vitamin (MULTIVITAMIN) tablet Take 1 tablet by mouth daily.    [provider]  Omega-3 Fatty Acids (FISH OIL PO) Take 2 tablets by mouth daily.    [provider]  PROLIA 60 MG/ML SOSY injection Inject into the skin. 05/07/20   [provider]  rivaroxaban (XARELTO) 20 MG TABS tablet TAKE 1 TABLET(20 MG) BY MOUTH DAILY 01/07/21   Jettie Booze, MD  STIOLTO RESPIMAT 2.5-2.5 MCG/ACT AERS Inhale 2 puffs into the lungs daily. 10/13/19   [provider]    Allergies    Cyclobenzaprine and Indomethacin  Review of Systems   Review of Systems  Constitutional:  Positive for diaphoresis (resolved).  Gastrointestinal:  Positive for diarrhea (x1).  Neurological:  Positive for light-headedness (resolved).  Hematological:  Bruises/bleeds easily.   Physical Exam Updated Vital Signs BP 91/64   Pulse 81   Resp (!) 23   Ht 5' 10" (1.778 m)   Wt 86.6 kg   SpO2 92%   BMI 27.39 kg/m   Physical Exam Vitals and nursing note reviewed.  Constitutional:      General: He is not in acute distress.    Appearance: Normal appearance.     Comments: Resting in the bed in NAD  HENT:     Head: Normocephalic and atraumatic.  Eyes:     Conjunctiva/sclera: Conjunctivae normal.     Pupils: Pupils are equal, round, and reactive to light.  Cardiovascular:     Rate and Rhythm: Normal rate and regular rhythm.     Pulses: Normal pulses.     Comments: Regular rate and rhythm.  Appears to be sinus per monitor Pulmonary:     Effort: Pulmonary effort is normal. No respiratory distress.     Breath sounds: Normal breath sounds. No wheezing.     Comments: Speaking in full sentences.  Clear lung sounds in all fields. Abdominal:     General: There is no  distension.     Palpations: Abdomen is soft. There is no mass.     Tenderness: There is no abdominal tenderness. There is no guarding or rebound.  Musculoskeletal:        General: Normal range of motion.     Cervical back: Normal range of motion and neck supple.     Right lower leg: No edema.     Left lower leg: No edema.  Skin:    General: Skin is warm and dry.     Capillary Refill: Capillary refill takes less than 2 seconds.  Neurological:     Mental Status: He is alert and oriented to person, place, and time.  Psychiatric:  Mood and Affect: Mood and affect normal.        Speech: Speech normal.        Behavior: Behavior normal.    ED Results / Procedures / Treatments   Labs (all labs ordered are listed, but only abnormal results are displayed) Labs Reviewed  BASIC METABOLIC PANEL - Abnormal; Notable for the following components:      Result Value   CO2 21 (*)    Glucose, Bld 197 (*)    BUN 45 (*)    Creatinine, Ser 1.36 (*)    Calcium 8.7 (*)    GFR, Estimated 54 (*)    All other components within normal limits  CBC - Abnormal; Notable for the following components:   RBC 4.05 (*)    Hemoglobin 12.5 (*)    HCT 36.7 (*)    All other components within normal limits  PROTIME-INR - Abnormal; Notable for the following components:   Prothrombin Time 23.1 (*)    INR 2.1 (*)    All other components within normal limits  RESP PANEL BY RT-PCR (FLU A&B, COVID) ARPGX2  MAGNESIUM  BRAIN NATRIURETIC PEPTIDE  APTT    EKG EKG Interpretation  Date/Time:  Saturday February 12 2021 20:30:58 EST Ventricular Rate:  83 PR Interval:    QRS Duration: 90 QT Interval:  387 QTC Calculation: 455 R Axis:   71 Text Interpretation: poor data quality, undetermined rhythm Ventricular premature complex RSR' in V1 or V2, probably normal variant Confirmed by Dorie Rank [phone]) on 02/12/2021 8:47:33 PM  Radiology DG Chest Port 1 View  Result Date: 02/12/2021 CLINICAL DATA:  Shortness  of breath EXAM: PORTABLE CHEST 1 VIEW COMPARISON:  06/19/2019 FINDINGS: The heart size and mediastinal contours are within normal limits. Both lungs are clear. The visualized skeletal structures are unremarkable. IMPRESSION: No active disease. Electronically Signed   By: Ulyses Jarred M.D.   On: 02/12/2021 21:49    Procedures Procedures   Medications Ordered in ED Medications  sodium chloride 0.9 % bolus 500 mL (500 mLs Intravenous New Bag/Given 02/12/21 2234)    ED Course  I have reviewed the triage vital signs and the nursing notes.  Pertinent labs & imaging results that were available during my care of the patient were reviewed by me and considered in my medical decision making (see chart for details).    MDM Rules/Calculators/A&P                           Patient presenting for evaluation of lightheadedness, sweatiness, and episode of diarrhea.  Per EMS, patient was in A. fib with RVR, this resolved Cardizem.  On arrival, patient is well-appearing.  Symptoms have completely resolved, and he feels at his baseline.  Normal sinus rhythm on the monitor.  We will check labs to ensure no significant electrolyte abnormality.  Will check hemoglobin for anemia.  Will obtain chest x-ray to rule out infection.  Labs interpreted by me, overall reassuring.  Electrolytes are at baseline.  Hemoglobin at baseline.  Patient remains in normal sinus rhythm.  Chest x-ray viewed and independently interpreted by me, no pneumonia, pneumothorax, effusion.  EKG shows a regular rate and rhythm, due to artifact difficult to ascertain if there are P waves, however due to resolution of symptoms and regular rhythm, this appears to be normal sinus.   On reevaluation, patient remains in normal sinus rhythm.  Continues to feel well.  Discussed findings with patient.  Discussed importance of taking home medication as prescribed, and encouraged him to follow-up with cardiology.  At this time, patient appears safe for  discharge.  Return precautions given.  Patient states he understands and agrees to plan.  Final Clinical Impression(s) / ED Diagnoses Final diagnoses:  Atrial fibrillation, unspecified type [facility])    Rx / DC Orders ED Discharge Orders     None        Franchot Heidelberg, PA-C 02/12/21 2246    Dorie Rank, MD 02/13/21 1319

## 2021-02-12 NOTE — Discharge Instructions (Signed)
Continue taking home medications as prescribed. It is very important that you take your metoprolol daily, as this will decrease risk of going into A. fib. Call your cardiologist to set up a follow-up appointment. Return to the emergency room if you develop difficulty breathing, chest pain, dizziness/lightheadedness, or any new, worsening, or concerning symptoms

## 2021-02-12 NOTE — ED Triage Notes (Signed)
The pt arrived by gems from homw  dizziness and sweating after a diarrhea stool  was found by gems to be in af rhy.  Hx od the same is on a blood thinner.  Ems started an iv and gave the pt cardizem 10mg  iv  the pt converted to a nsr.   A and o at present

## 2021-02-13 ENCOUNTER — Inpatient Hospital Stay (HOSPITAL_COMMUNITY): Payer: Medicare Other

## 2021-02-13 ENCOUNTER — Telehealth: Payer: Self-pay | Admitting: Interventional Cardiology

## 2021-02-13 ENCOUNTER — Encounter (HOSPITAL_COMMUNITY): Payer: Self-pay | Admitting: Emergency Medicine

## 2021-02-13 ENCOUNTER — Other Ambulatory Visit: Payer: Self-pay

## 2021-02-13 ENCOUNTER — Inpatient Hospital Stay (HOSPITAL_COMMUNITY)
Admission: EM | Admit: 2021-02-13 | Discharge: 2021-02-16 | DRG: 369 | Disposition: A | Discharge status: Home / Self Care | Payer: Medicare Other | Attending: Internal Medicine | Admitting: Internal Medicine

## 2021-02-13 DIAGNOSIS — E861 Hypovolemia: Secondary | ICD-10-CM

## 2021-02-13 DIAGNOSIS — I4819 Other persistent atrial fibrillation: Secondary | ICD-10-CM

## 2021-02-13 DIAGNOSIS — Z8616 Personal history of COVID-19: Secondary | ICD-10-CM

## 2021-02-13 DIAGNOSIS — I9589 Other hypotension: Secondary | ICD-10-CM | POA: Diagnosis present

## 2021-02-13 DIAGNOSIS — D72829 Elevated white blood cell count, unspecified: Secondary | ICD-10-CM | POA: Diagnosis present

## 2021-02-13 DIAGNOSIS — Z823 Family history of stroke: Secondary | ICD-10-CM

## 2021-02-13 DIAGNOSIS — I480 Paroxysmal atrial fibrillation: Secondary | ICD-10-CM | POA: Diagnosis present

## 2021-02-13 DIAGNOSIS — D500 Iron deficiency anemia secondary to blood loss (chronic): Secondary | ICD-10-CM

## 2021-02-13 DIAGNOSIS — I7143 Infrarenal abdominal aortic aneurysm, without rupture: Secondary | ICD-10-CM | POA: Diagnosis present

## 2021-02-13 DIAGNOSIS — M8580 Other specified disorders of bone density and structure, unspecified site: Secondary | ICD-10-CM | POA: Diagnosis present

## 2021-02-13 DIAGNOSIS — I723 Aneurysm of iliac artery: Secondary | ICD-10-CM | POA: Diagnosis present

## 2021-02-13 DIAGNOSIS — Z8249 Family history of ischemic heart disease and other diseases of the circulatory system: Secondary | ICD-10-CM | POA: Diagnosis not present

## 2021-02-13 DIAGNOSIS — Z79899 Other long term (current) drug therapy: Secondary | ICD-10-CM | POA: Diagnosis not present

## 2021-02-13 DIAGNOSIS — K920 Hematemesis: Secondary | ICD-10-CM | POA: Diagnosis not present

## 2021-02-13 DIAGNOSIS — Z8673 Personal history of transient ischemic attack (TIA), and cerebral infarction without residual deficits: Secondary | ICD-10-CM

## 2021-02-13 DIAGNOSIS — J449 Chronic obstructive pulmonary disease, unspecified: Secondary | ICD-10-CM | POA: Diagnosis present

## 2021-02-13 DIAGNOSIS — K922 Gastrointestinal hemorrhage, unspecified: Secondary | ICD-10-CM | POA: Diagnosis not present

## 2021-02-13 DIAGNOSIS — K3189 Other diseases of stomach and duodenum: Secondary | ICD-10-CM | POA: Diagnosis present

## 2021-02-13 DIAGNOSIS — E785 Hyperlipidemia, unspecified: Secondary | ICD-10-CM | POA: Diagnosis present

## 2021-02-13 DIAGNOSIS — Z801 Family history of malignant neoplasm of trachea, bronchus and lung: Secondary | ICD-10-CM | POA: Diagnosis not present

## 2021-02-13 DIAGNOSIS — I4891 Unspecified atrial fibrillation: Secondary | ICD-10-CM | POA: Diagnosis not present

## 2021-02-13 DIAGNOSIS — I129 Hypertensive chronic kidney disease with stage 1 through stage 4 chronic kidney disease, or unspecified chronic kidney disease: Secondary | ICD-10-CM | POA: Diagnosis present

## 2021-02-13 DIAGNOSIS — M109 Gout, unspecified: Secondary | ICD-10-CM | POA: Diagnosis present

## 2021-02-13 DIAGNOSIS — R000 Tachycardia, unspecified: Secondary | ICD-10-CM

## 2021-02-13 DIAGNOSIS — K921 Melena: Secondary | ICD-10-CM

## 2021-02-13 DIAGNOSIS — K449 Diaphragmatic hernia without obstruction or gangrene: Secondary | ICD-10-CM | POA: Diagnosis present

## 2021-02-13 DIAGNOSIS — E1122 Type 2 diabetes mellitus with diabetic chronic kidney disease: Secondary | ICD-10-CM | POA: Diagnosis present

## 2021-02-13 DIAGNOSIS — D62 Acute posthemorrhagic anemia: Secondary | ICD-10-CM | POA: Diagnosis present

## 2021-02-13 DIAGNOSIS — N1831 Chronic kidney disease, stage 3a: Secondary | ICD-10-CM | POA: Diagnosis present

## 2021-02-13 DIAGNOSIS — Z87891 Personal history of nicotine dependence: Secondary | ICD-10-CM | POA: Diagnosis not present

## 2021-02-13 DIAGNOSIS — K2101 Gastro-esophageal reflux disease with esophagitis, with bleeding: Principal | ICD-10-CM | POA: Diagnosis present

## 2021-02-13 DIAGNOSIS — N179 Acute kidney failure, unspecified: Secondary | ICD-10-CM | POA: Diagnosis present

## 2021-02-13 DIAGNOSIS — Z7901 Long term (current) use of anticoagulants: Secondary | ICD-10-CM

## 2021-02-13 LAB — ECHOCARDIOGRAM COMPLETE
AR max vel: 2.65 cm2
AV Area VTI: 2.53 cm2
AV Area mean vel: 2.57 cm2
AV Mean grad: 4.3 mmHg
AV Peak grad: 7.2 mmHg
Ao pk vel: 1.34 m/s
Area-P 1/2: 6.17 cm2
S' Lateral: 2.1 cm

## 2021-02-13 LAB — TSH: TSH: 1.247 u[IU]/mL (ref 0.350–4.500)

## 2021-02-13 LAB — CBC
HCT: 26.3 % — ABNORMAL LOW (ref 39.0–52.0)
Hemoglobin: 8.7 g/dL — ABNORMAL LOW (ref 13.0–17.0)
MCH: 29.9 pg (ref 26.0–34.0)
MCHC: 33.1 g/dL (ref 30.0–36.0)
MCV: 90.4 fL (ref 80.0–100.0)
Platelets: 114 10*3/uL — ABNORMAL LOW (ref 150–400)
RBC: 2.91 MIL/uL — ABNORMAL LOW (ref 4.22–5.81)
RDW: 15.1 % (ref 11.5–15.5)
WBC: 10.4 10*3/uL (ref 4.0–10.5)
nRBC: 0 % (ref 0.0–0.2)

## 2021-02-13 LAB — BASIC METABOLIC PANEL
Anion gap: 9 (ref 5–15)
BUN: 57 mg/dL — ABNORMAL HIGH (ref 8–23)
CO2: 18 mmol/L — ABNORMAL LOW (ref 22–32)
Calcium: 8 mg/dL — ABNORMAL LOW (ref 8.9–10.3)
Chloride: 111 mmol/L (ref 98–111)
Creatinine, Ser: 1.61 mg/dL — ABNORMAL HIGH (ref 0.61–1.24)
GFR, Estimated: 44 mL/min — ABNORMAL LOW (ref >60)
Glucose, Bld: 228 mg/dL — ABNORMAL HIGH (ref 70–99)
Potassium: 4.6 mmol/L (ref 3.5–5.1)
Sodium: 138 mmol/L (ref 135–145)

## 2021-02-13 LAB — APTT: aPTT: 29 s (ref 24–36)

## 2021-02-13 LAB — CBC WITH DIFFERENTIAL/PLATELET
Abs Immature Granulocytes: 0.08 10*3/uL — ABNORMAL HIGH (ref 0.00–0.07)
Basophils Absolute: 0 10*3/uL (ref 0.0–0.1)
Basophils Relative: 0 %
Eosinophils Absolute: 0 10*3/uL (ref 0.0–0.5)
Eosinophils Relative: 0 %
HCT: 28.3 % — ABNORMAL LOW (ref 39.0–52.0)
Hemoglobin: 9.5 g/dL — ABNORMAL LOW (ref 13.0–17.0)
Immature Granulocytes: 1 %
Lymphocytes Relative: 10 %
Lymphs Abs: 1.3 10*3/uL (ref 0.7–4.0)
MCH: 31 pg (ref 26.0–34.0)
MCHC: 33.6 g/dL (ref 30.0–36.0)
MCV: 92.5 fL (ref 80.0–100.0)
Monocytes Absolute: 0.7 10*3/uL (ref 0.1–1.0)
Monocytes Relative: 6 %
Neutro Abs: 10.4 10*3/uL — ABNORMAL HIGH (ref 1.7–7.7)
Neutrophils Relative %: 83 %
Platelets: 152 10*3/uL (ref 150–400)
RBC: 3.06 MIL/uL — ABNORMAL LOW (ref 4.22–5.81)
RDW: 13.1 % (ref 11.5–15.5)
WBC: 12.5 10*3/uL — ABNORMAL HIGH (ref 4.0–10.5)
nRBC: 0 % (ref 0.0–0.2)

## 2021-02-13 LAB — HEMOGLOBIN A1C
Hgb A1c MFr Bld: 6.3 % — ABNORMAL HIGH (ref 4.8–5.6)
Mean Plasma Glucose: 134.11 mg/dL

## 2021-02-13 LAB — PREPARE RBC (CROSSMATCH)

## 2021-02-13 LAB — MRSA NEXT GEN BY PCR, NASAL: MRSA by PCR Next Gen: NOT DETECTED

## 2021-02-13 LAB — TROPONIN I (HIGH SENSITIVITY)
Troponin I (High Sensitivity): 13 ng/L (ref <18)
Troponin I (High Sensitivity): 17 ng/L (ref <18)

## 2021-02-13 MED ORDER — AMIODARONE HCL IN DEXTROSE 360-4.14 MG/200ML-% IV SOLN
60.0000 mg/h | INTRAVENOUS | Status: AC
  Administered 2021-02-13 (×2): 60 mg/h via INTRAVENOUS
  Filled 2021-02-13 (×2): qty 200

## 2021-02-13 MED ORDER — LACTATED RINGERS IV BOLUS
1000.0000 mL | Freq: Once | INTRAVENOUS | Status: AC
  Administered 2021-02-13: 1000 mL via INTRAVENOUS

## 2021-02-13 MED ORDER — ATORVASTATIN CALCIUM 10 MG PO TABS
10.0000 mg | ORAL_TABLET | Freq: Every day | ORAL | Status: DC
  Administered 2021-02-13 – 2021-02-16 (×4): 10 mg via ORAL
  Filled 2021-02-13 (×4): qty 1

## 2021-02-13 MED ORDER — SODIUM CHLORIDE 0.9 % IV SOLN
1000.0000 mL | INTRAVENOUS | Status: DC
  Administered 2021-02-13 – 2021-02-14 (×3): 1000 mL via INTRAVENOUS

## 2021-02-13 MED ORDER — ONE-DAILY MULTI VITAMINS PO TABS: 1.0000 | ORAL_TABLET | Freq: Every day | ORAL | Status: DC

## 2021-02-13 MED ORDER — AMIODARONE LOAD VIA INFUSION
150.0000 mg | Freq: Once | INTRAVENOUS | Status: AC
  Administered 2021-02-13: 150 mg via INTRAVENOUS
  Filled 2021-02-13: qty 83.34

## 2021-02-13 MED ORDER — SODIUM CHLORIDE 0.9 % IV SOLN
10.0000 mL/h | Freq: Once | INTRAVENOUS | Status: AC
  Administered 2021-02-13: 10 mL/h via INTRAVENOUS

## 2021-02-13 MED ORDER — ONDANSETRON HCL 4 MG/2ML IJ SOLN: 4.0000 mg | Freq: Four times a day (QID) | INTRAMUSCULAR | Status: DC | PRN

## 2021-02-13 MED ORDER — DILTIAZEM HCL-DEXTROSE 125-5 MG/125ML-% IV SOLN (PREMIX)
2.5000 mg/h | INTRAVENOUS | Status: DC
  Administered 2021-02-13: 5 mg/h via INTRAVENOUS
  Filled 2021-02-13: qty 125

## 2021-02-13 MED ORDER — AMIODARONE HCL IN DEXTROSE 360-4.14 MG/200ML-% IV SOLN
30.0000 mg/h | INTRAVENOUS | Status: DC
  Administered 2021-02-13 – 2021-02-15 (×4): 30 mg/h via INTRAVENOUS
  Filled 2021-02-13 (×3): qty 200

## 2021-02-13 MED ORDER — EMPTY CONTAINERS FLEXIBLE MISC: 1800.0000 mg | Freq: Once | Status: DC

## 2021-02-13 MED ORDER — ACETAMINOPHEN 325 MG PO TABS: 650.0000 mg | ORAL_TABLET | ORAL | Status: DC | PRN

## 2021-02-13 MED ORDER — PROTHROMBIN COMPLEX CONC HUMAN 500 UNITS IV KIT
4349.0000 [IU] | PACK | Status: AC
  Administered 2021-02-13: 4349 [IU] via INTRAVENOUS
  Filled 2021-02-13: qty 4349

## 2021-02-13 MED ORDER — SODIUM CHLORIDE 0.9 % IV BOLUS (SEPSIS)
1000.0000 mL | Freq: Once | INTRAVENOUS | Status: AC
  Administered 2021-02-13: 1000 mL via INTRAVENOUS

## 2021-02-13 MED ORDER — DILTIAZEM HCL-DEXTROSE 125-5 MG/125ML-% IV SOLN (PREMIX)
5.0000 mg/h | INTRAVENOUS | Status: DC
Start: 2021-02-13 — End: 2021-02-14

## 2021-02-13 MED ORDER — COENZYME Q10 10 MG PO CAPS: ORAL_CAPSULE | Freq: Every day | ORAL | Status: DC

## 2021-02-13 MED ORDER — ALLOPURINOL 300 MG PO TABS
300.0000 mg | ORAL_TABLET | Freq: Every day | ORAL | Status: DC
  Administered 2021-02-13 – 2021-02-16 (×4): 300 mg via ORAL
  Filled 2021-02-13 (×4): qty 1

## 2021-02-13 MED ORDER — ADULT MULTIVITAMIN W/MINERALS CH
1.0000 | ORAL_TABLET | Freq: Every day | ORAL | Status: DC
  Administered 2021-02-13 – 2021-02-16 (×4): 1 via ORAL
  Filled 2021-02-13 (×4): qty 1

## 2021-02-13 MED ORDER — PANTOPRAZOLE SODIUM 40 MG IV SOLR
40.0000 mg | Freq: Two times a day (BID) | INTRAVENOUS | Status: DC
  Administered 2021-02-13 – 2021-02-14 (×4): 40 mg via INTRAVENOUS
  Filled 2021-02-13 (×4): qty 40

## 2021-02-13 MED ORDER — GLUCOSAMINE-CHONDROITIN PO CAPS: ORAL_CAPSULE | Freq: Every day | ORAL | Status: DC

## 2021-02-13 NOTE — ED Notes (Addendum)
Written consent to receive blood products given to this RN by pt & was signed on the consent form by his wife with pt's permission & witnessed by this RN since pt was to far away from the signature pad to sign.

## 2021-02-13 NOTE — Plan of Care (Signed)
New pt admission from ED. Pt brought to the floor in stable condition. Vitals taken. Initial Assessment done. All immediate pertinent needs to patient addressed. Patient Guide given to patient. Important safety instructions relating to hospitalization reviewed with patient. Patient verbalized understanding. Pt is in Amiodarone IV drip and IV NS. HR is running between 100-130's this time, pt is in clear liquid diet. Planned for EGD tomorrow am. Wife is in bed side and updated. Will continue to monitor pt.   Lonia Farber, RN

## 2021-02-13 NOTE — ED Triage Notes (Signed)
Patient here with recurrent Afib RVR, rate of 150 with dizziness and nausea and vomiting.  Patient having vomiting with bright red blood, 2 episodes since 1am.  Patient was pale on arrival of EMS.  Patient was given fluids, 1L NS.  Initial BP was 90 systolic.  4 mg of Zofran given.

## 2021-02-13 NOTE — H&P (View-Only) (Signed)
Weekend Coverage for Dr. Benson Norway  Consultation  Referring Provider: Dr. Daryll Drown     Primary Care Physician:  Christain Sacramento, MD Primary Gastroenterologist: Dr. Benson Norway (from epic review looks like he saw in office 2021) Reason for Consultation: Hematemesis            HPI:   Jonathon Stevens is a 75 y.o. male with a past medical history of A. fib, prior CVA on Xarelto, hyperlipidemia, gout and COPD who presented to the ER today with a complaint of nausea, lightheadedness, diaphoresis and hematemesis.    Apparently patient initially presented on 11/19 for similar symptoms and found to be in A. fib with RVR, given Cardizem bolus with conversion back to normal sinus rhythm, evaluated in the ED and labs are reassuring and he remained in normal sinus rhythm so patient was discharged.    Today, patient explains that after arriving back home he woke up a few hours later sweating with nausea and had an episode of bloody emesis and one episode of bloody watery stool.  Called the EMS again and was found to be back in A. fib with RVR.  He was brought back to the ER and found to have a drop in his hemoglobin remained in A. fib with RVR.  Last dose of Xarelto was Friday morning 02/11/2021.  Patient's wife tells me he also took 4 baby aspirin the first time he was having chest pain on Saturday, she is not sure if this matters.  Patient tells me he does not have any abdominal or rectal pain.  His nausea and vomiting has subsided since he was given a shot of Phenergan in the ambulance.  Does report that his episode of vomiting was very forceful.  Describes having history of hemorrhoids but "this was more blood than that".  Wife tells me it was bright red.    Does report a colonoscopy over 5 years ago with some benign polyps     Denies fever, chills or weight loss.  ER course: Hemoglobin 9.5 (12.5 on 11/19), A. fib with RVR   07/27/2014 colonoscopy history at [facility], cannot see report  Past Medical History:   Diagnosis Date  . Atrial flutter ([place])   . CVA (cerebral infarction)    embolic CVA 03/1550  . Diabetes mellitus without complication ([place])   . Gout   . HLD (hyperlipidemia)   . Hx of echocardiogram    a. ECHO 10/08/13: EF 45-50%, mild hypokinesis of apical myocardium. Mild LA dilation, mild RA dilation;  b.  TEE (10/09/13):  EF 50-55%, normal wall motion, no LAA clot  . Hypertension   . Stroke [facility])     Past Surgical History:  Procedure Laterality Date  . TEE WITHOUT CARDIOVERSION N/A 10/09/2013   Procedure: TRANSESOPHAGEAL ECHOCARDIOGRAM (TEE);  Surgeon: Pixie Casino, MD;  Location: [facility] ENDOSCOPY;  Service: Cardiovascular;  Laterality: N/A;    Family History  Problem Relation Age of Onset  . Hypertension Mother   . Stroke Mother   . Hypertension Father   . Heart attack Neg Hx     Social History   Tobacco Use  . Smoking status: Former    Packs/day: 0.50    Years: 15.00    Pack years: 7.50    Types: Cigarettes    Quit date: 07/13/2015    Years since quitting: 5.5  . Smokeless tobacco: Never  Vaping Use  . Vaping Use: Never used  Substance Use Topics  . Alcohol use: No  Alcohol/week: 0.0 standard drinks  . Drug use: No    Prior to Admission medications   Medication Sig Start Date End Date Taking? Authorizing Provider  allopurinol (ZYLOPRIM) 300 MG tablet Take 300 mg by mouth daily after breakfast.  04/26/16   [provider]  atorvastatin (LIPITOR) 10 MG tablet TAKE 1 TABLET BY MOUTH DAILY AT 6 PM 01/04/21   Jettie Booze, MD  blood glucose meter kit and supplies KIT Dispense based on patient and insurance preference. Use up to four times daily as directed. (FOR ICD-9 250.00, 250.01). 04/10/19   Elodia Florence., MD  COENZYME Q10 PO Take 1 tablet by mouth daily.     [provider]  GLUCOSAMINE-CHONDROITIN PO Take 1 tablet by mouth daily.     [provider]  metoprolol tartrate (LOPRESSOR) 25 MG tablet TAKE 1 TABLET(25 MG)  BY MOUTH TWICE DAILY 01/04/21   Jettie Booze, MD  Multiple Vitamin (MULTIVITAMIN) tablet Take 1 tablet by mouth daily.    [provider]  Omega-3 Fatty Acids (FISH OIL PO) Take 2 tablets by mouth daily.    [provider]  PROLIA 60 MG/ML SOSY injection Inject into the skin. 05/07/20   [provider]  rivaroxaban (XARELTO) 20 MG TABS tablet TAKE 1 TABLET(20 MG) BY MOUTH DAILY 01/07/21   Jettie Booze, MD  STIOLTO RESPIMAT 2.5-2.5 MCG/ACT AERS Inhale 2 puffs into the lungs daily. 10/13/19   [provider]    Current Facility-Administered Medications  Medication Dose Route Frequency Provider Last Rate Last Admin  . 0.9 %  sodium chloride infusion  1,000 mL Intravenous Continuous Rehman, Areeg N, DO 125 mL/hr at 02/13/21 0653 1,000 mL at 02/13/21 0653  . acetaminophen (TYLENOL) tablet 650 mg  650 mg Oral Q4H PRN Rehman, Areeg N, DO      . allopurinol (ZYLOPRIM) tablet 300 mg  300 mg Oral QPC breakfast Rehman, Areeg N, DO      . atorvastatin (LIPITOR) tablet 10 mg  10 mg Oral Daily Rehman, Areeg N, DO      . Coenzyme Q10   Oral Daily Rehman, Areeg N, DO      . diltiazem (CARDIZEM) 125 mg in dextrose 5% 125 mL (1 mg/mL) infusion  2.5-15 mg/hr Intravenous Continuous Rehman, Areeg N, DO 5 mL/hr at 02/13/21 0831 5 mg/hr at 02/13/21 0831  . Glucosamine-Chondroitin CAPS   Oral Daily Rehman, Areeg N, DO      . multivitamin with minerals tablet 1 tablet  1 tablet Oral Daily Gilles Chiquito B, MD      . ondansetron (ZOFRAN) injection 4 mg  4 mg Intravenous Q6H PRN Rehman, Areeg N, DO       Current Outpatient Medications  Medication Sig Dispense Refill  . allopurinol (ZYLOPRIM) 300 MG tablet Take 300 mg by mouth daily after breakfast.     . atorvastatin (LIPITOR) 10 MG tablet TAKE 1 TABLET BY MOUTH DAILY AT 6 PM 90 tablet 2  . blood glucose meter kit and supplies KIT Dispense based on patient and insurance preference. Use up to four times daily as directed.  (FOR ICD-9 250.00, 250.01). 1 each 0  . COENZYME Q10 PO Take 1 tablet by mouth daily.     Marland Kitchen GLUCOSAMINE-CHONDROITIN PO Take 1 tablet by mouth daily.     . metoprolol tartrate (LOPRESSOR) 25 MG tablet TAKE 1 TABLET(25 MG) BY MOUTH TWICE DAILY 180 tablet 2  . Multiple Vitamin (MULTIVITAMIN) tablet Take 1 tablet by  mouth daily.    . Omega-3 Fatty Acids (FISH OIL PO) Take 2 tablets by mouth daily.    Marland Kitchen PROLIA 60 MG/ML SOSY injection Inject into the skin.    . rivaroxaban (XARELTO) 20 MG TABS tablet TAKE 1 TABLET(20 MG) BY MOUTH DAILY 90 tablet 1  . STIOLTO RESPIMAT 2.5-2.5 MCG/ACT AERS Inhale 2 puffs into the lungs daily.      Allergies as of 02/13/2021 - Review Complete 02/13/2021  Allergen Reaction Noted  . Cyclobenzaprine Nausea Only 10/12/2017  . Indomethacin Rash 07/20/2015     Review of Systems:    Constitutional: No weight loss, fever or chills Skin: No rash  Cardiovascular: +chest pain Respiratory: No SOB  Gastrointestinal: See HPI and otherwise negative Genitourinary: No dysuria  Neurological: No headache, dizziness or syncope Musculoskeletal: No new muscle or joint pain Hematologic: No bruising Psychiatric: No history of depression or anxiety    Physical Exam:  Vital signs in last 24 hours: Temp:  [97.3 F (36.3 C)-98.1 F (36.7 C)] 97.5 F (36.4 C) (11/20 0823) Pulse Rate:  [62-157] 98 (11/20 0831) Resp:  [6-24] 24 (11/20 0831) BP: (70-118)/(32-96) 115/85 (11/20 0830) SpO2:  [92 %-100 %] 99 % (11/20 0831) Weight:  [86.6 kg] 86.6 kg (11/19 2032)   General:   Pleasant Caucasian male appears to be in NAD, Well developed, Well nourished, alert and cooperative Head:  Normocephalic and atraumatic. Eyes:   PEERL, EOMI. No icterus. Conjunctiva pink. Ears:  Normal auditory acuity. Neck:  Supple Throat: Oral cavity and pharynx without inflammation, swelling or lesion.  Lungs: Respirations even and unlabored. Lungs clear to auscultation bilaterally.   No wheezes, crackles,  or rhonchi.  Heart: Normal S1, S2. No MRG. Tachycardic, Irregularly irregular rhythm, No peripheral edema, cyanosis or pallor.  Abdomen:  Soft, nondistended, nontender. No rebound or guarding. Normal bowel sounds. No appreciable masses or hepatomegaly. Rectal:  External: Inflamed external hemorrhoid, residue of blood, maroon in color around rectum; Internal: no mass, no ttp, maroon bloody residue on glove Msk:  Symmetrical without gross deformities. Peripheral pulses intact.  Extremities:  Without edema, no deformity or joint abnormality.  Neurologic:  Alert and  oriented x4;  grossly normal neurologically.  Skin:   Dry and intact without significant lesions or rashes. Psychiatric: Demonstrates good judgement and reason without abnormal affect or behaviors.   LAB RESULTS: Recent Labs    02/12/21 2102 02/13/21 0551  WBC 10.2 12.5*  HGB 12.5* 9.5*  HCT 36.7* 28.3*  PLT 166 152   BMET Recent Labs    02/12/21 2102 02/13/21 0551  NA 136 138  K 4.9 4.6  CL 105 111  CO2 21* 18*  GLUCOSE 197* 228*  BUN 45* 57*  CREATININE 1.36* 1.61*  CALCIUM 8.7* 8.0*    PT/INR Recent Labs    02/12/21 2102  LABPROT 23.1*  INR 2.1*    STUDIES: DG Chest Port 1 View  Result Date: 02/12/2021 CLINICAL DATA:  Shortness of breath EXAM: PORTABLE CHEST 1 VIEW COMPARISON:  06/19/2019 FINDINGS: The heart size and mediastinal contours are within normal limits. Both lungs are clear. The visualized skeletal structures are unremarkable. IMPRESSION: No active disease. Electronically Signed   By: Ulyses Jarred M.D.   On: 02/12/2021 21:49     Impression / Plan:   Impression: 1.  Hematemesis/hematochezia: Describes episode of forceful vomiting early this morning with bright red blood, also had a diarrheal episode with a lot of bright red blood per his wife, rectal exam with residue  of blood but otherwise unrevealing, last colonoscopy in 2016 (cannot see report), hemoglobin decreased as below; consider  lower versus upper GI bleed, hard to discern, it sounds like hematemesis could be from Mallory-Weiss tear given forcefulness of vomiting episode, but blood from rectum was described as bright red 2.  Acute blood loss anemia: Hemoglobin 12.5--> 9.5 overnight; with above 3.  A. fib with RVR: History of A. fib diagnosed 5 years ago with stroke, maintained on Xarelto, last dose 02/12/2019 2 AM 4.  AKI on CKD stage III: Creatinine 1.6, BUN 57 5.  Diabetes type 2 6.  COPD 7.  Chronic fatigue: Diagnosed after COVID in January 2021  Plan: 1.  Agree with transfusing 1 unit PRBCs now and continual monitoring of hemoglobin every 8 hours unless further episodes of GI bleeding witnessed. 2.   For now given A. fib with RVR no plans for immediate endoscopic procedures, but patient will likely benefit from EGD and colonoscopy in the future.  Hopefully over the next 24 to 48 hours we will be able to better discern whether this is likely upper or lower source. 3.  Ordered Pantoprazole 40 mg IV twice daily 4.  Okay with clear diet for now if hospitalist team is okay with him having food  Thank you for your kind consultation, we will continue to follow.  Lavone Nian [facility]  02/13/2021, 8:53 AM   ________________________________________________________________________  Velora Heckler GI MD note:  I personally examined the patient, reviewed the data and agree with the assessment and plan described above.  75 yo man on xarelto for afib, presented with rapid afib.  Had limited hematemesis and BRPBR.  Probably UGI bleeding. NEed to allow xarelto washout (24-48 hours) and then will likely proceed with EGD. Drs. Mann/Hung to assume his care in the morning.     Owens Loffler, MD [facility] Pager [phone]

## 2021-02-13 NOTE — ED Provider Notes (Signed)
[facility] EMERGENCY DEPARTMENT Provider Note  CSN: [id number] Arrival date & time: 02/13/21 3212  Chief Complaint(s) Atrial Fibrillation  HPI Jonathon Stevens is a 75 y.o. male    Atrial Fibrillation This is a recurrent problem. The current episode started less than 1 hour ago. The problem occurs constantly. The problem has not changed since onset.Pertinent negatives include no chest pain, no abdominal pain, no headaches and no shortness of breath. Nothing aggravates the symptoms. Nothing relieves the symptoms. He has tried nothing for the symptoms.    Patient was seen approx 10 hrs ago for the same and discharged after he converted to NSR after cardizem bolus. Labs were reassuring. Went home, fell asleep and was woken up by sudden onset diaphoresis.  He had nausea and hematemesis.  EMS called and noted soft sBP in 90s. Noted to be in [place]. Given IVF bolus. No diltiazem given due to BPs.  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 ECHO 10/08/13: EF 45-50%, mild hypokinesis of apical myocardium. Mild LA dilation, mild RA dilation;  b.  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)    Cerebral embolism with cerebral infarction ([place]) 10/07/2013   Home Medication(s) Prior to Admission medications   Medication Sig Start Date End Date Taking? Authorizing Provider  allopurinol (ZYLOPRIM) 300 MG tablet Take 300 mg by mouth daily after breakfast.  04/26/16   [provider]  atorvastatin (LIPITOR) 10 MG tablet TAKE 1 TABLET BY MOUTH DAILY AT 6 PM 01/04/21   Jettie Booze, MD  blood glucose meter kit and supplies KIT Dispense based on patient and insurance preference. Use up to four times daily as directed. (FOR ICD-9 250.00, 250.01). 04/10/19   Elodia Florence., MD  COENZYME Q10 PO Take 1 tablet by mouth daily.     [provider]  GLUCOSAMINE-CHONDROITIN PO Take 1 tablet by mouth daily.     [provider]  metoprolol tartrate (LOPRESSOR) 25 MG tablet TAKE 1 TABLET(25 MG) BY MOUTH TWICE DAILY 01/04/21   Jettie Booze, MD  Multiple Vitamin (MULTIVITAMIN) tablet Take 1 tablet by mouth daily.    [provider]  Omega-3 Fatty Acids (FISH OIL PO) Take 2 tablets by mouth daily.    [provider]  PROLIA 60 MG/ML SOSY injection Inject into the skin. 05/07/20  [provider]  rivaroxaban (XARELTO) 20 MG TABS tablet TAKE 1 TABLET(20 MG) BY MOUTH DAILY 01/07/21   Jettie Booze, MD  STIOLTO RESPIMAT 2.5-2.5 MCG/ACT AERS Inhale 2 puffs into the lungs daily. 10/13/19   [provider]                                                                                                                                    Past Surgical History Past  Surgical History:  Procedure Laterality Date   TEE WITHOUT CARDIOVERSION N/A 10/09/2013   Procedure: TRANSESOPHAGEAL ECHOCARDIOGRAM (TEE);  Surgeon: Pixie Casino, MD;  Location: [facility] ENDOSCOPY;  Service: Cardiovascular;  Laterality: N/A;   Family History Family History  Problem Relation Age of Onset   Hypertension Mother    Stroke Mother    Hypertension Father    Heart attack Neg Hx     Social History Social History   Tobacco Use   Smoking status: Former    Packs/day: 0.50    Years: 15.00    Pack years: 7.50    Types: Cigarettes    Quit date: 07/13/2015    Years since quitting: 5.5   Smokeless tobacco: Never  Vaping Use   Vaping Use: Never used  Substance Use Topics   Alcohol use: No    Alcohol/week: 0.0 standard drinks   Drug use: No   Allergies Cyclobenzaprine and Indomethacin  Review of Systems Review of Systems  Respiratory:  Negative for shortness of breath.   Cardiovascular:  Negative for chest pain.  Gastrointestinal:  Negative for abdominal pain.  Neurological:  Negative for headaches.  All other systems are reviewed and are negative for acute change except as noted in the HPI  Physical Exam Vital Signs  I have reviewed the triage vital signs BP 92/67  Pulse 144  Resp (!) 22   SpO2 94%   Physical Exam Vitals reviewed.  Constitutional:      General: He is not in acute distress.    Appearance: He is well-developed. He is not diaphoretic.  HENT:     Head: Normocephalic and atraumatic.     Nose: Nose normal.  Eyes:     General: No scleral icterus.       Right eye: No discharge.        Left eye: No discharge.     Conjunctiva/sclera: Conjunctivae normal.     Pupils: Pupils are equal, round, and reactive to light.  Cardiovascular:     Rate and Rhythm: Tachycardia present. Rhythm irregularly irregular.     Heart sounds: No murmur heard.   No friction rub. No gallop.  Pulmonary:     Effort: Pulmonary effort is normal. No respiratory distress.      Breath sounds: Normal breath sounds. No stridor. No rales.  Abdominal:     General: There is no distension.     Palpations: Abdomen is soft.     Tenderness:  There is no abdominal tenderness. There is no guarding or rebound.  Musculoskeletal:        General: No tenderness.     Cervical back: Normal range of motion and neck supple.  Skin:    General: Skin is warm and dry.     Findings: No erythema or rash.  Neurological:     Mental Status: He is alert and oriented to person, place, and time.    ED Results and Treatments Labs (all labs ordered are listed, but only abnormal results are displayed) Labs Reviewed  CBC WITH DIFFERENTIAL/PLATELET - Abnormal; Notable for the following components:      Result Value   WBC 12.5 (*)    RBC 3.06 (*)    Hemoglobin 9.5 (*)    HCT 28.3 (*)    Neutro Abs 10.4 (*)    Abs Immature Granulocytes 0.08 (*)    All other components within normal limits  BASIC METABOLIC PANEL - Abnormal; Notable for the following components:   CO2 18 (*)    Glucose, Bld 228 (*)    BUN 57 (*)    Creatinine, Ser 1.61 (*)    Calcium 8.0 (*)    GFR, Estimated 44 (*)    All other components within normal limits  APTT  PREPARE RBC (CROSSMATCH)  TYPE AND SCREEN                                                                                                                         EKG  EKG Interpretation  Date/Time:  Sunday February 13 2021 05:42:36 EST Ventricular Rate:  139 PR Interval:    QRS Duration: 86 QT Interval:  340 QTC Calculation: 518 R Axis:   59 Text Interpretation: Atrial flutter with predominant 2:1 AV block RSR' in V1 or V2, probably normal variant ST depression, probably rate related Prolonged QT interval Confirmed by Addison Lank [phone]) on 02/13/2021 5:44:15 AM       Radiology DG Chest Port 1 View  Result Date: 02/12/2021 CLINICAL DATA:  Shortness of breath EXAM: PORTABLE CHEST 1 VIEW COMPARISON:  06/19/2019 FINDINGS: The heart size and  mediastinal contours are within normal limits. Both lungs are clear. The visualized skeletal structures are unremarkable. IMPRESSION: No active disease. Electronically Signed   By: Ulyses Jarred M.D.   On: 02/12/2021 21:49    Pertinent labs & imaging results that were available during my care of the patient were reviewed by me and considered in my medical decision making (see MDM for details).  Medications Ordered in ED Medications  diltiazem (CARDIZEM) 125 mg in dextrose 5% 125 mL (1 mg/mL) infusion (2 mg/hr Intravenous Rate/Dose Change 02/13/21 0728)  sodium chloride 0.9 % bolus 1,000 mL (0 mLs Intravenous Stopped 02/13/21 0720)    Followed by  0.9 %  sodium chloride infusion (1,000 mLs Intravenous New Bag/Given 02/13/21 0653)  0.9 %  sodium chloride infusion (has no administration in time range)  prothrombin complex conc human (KCENTRA) IVPB  4,349 Units (4,349 Units Intravenous New Bag/Given 02/13/21 0726)                                                                                                                                     Procedures .1-3 Lead EKG Interpretation Performed by: Fatima Blank, MD Authorized by: Fatima Blank, MD     Interpretation: abnormal     ECG rate:  120-144   ECG rate assessment: tachycardic     Rhythm: atrial fibrillation     Ectopy: none     Conduction: normal   .Critical Care Performed by: Fatima Blank, MD Authorized by: Fatima Blank, MD   Critical care provider statement:    Critical care time (minutes):  11   Critical care time was exclusive of:  Separately billable procedures and treating other patients   Critical care was necessary to treat or prevent imminent or life-threatening deterioration of the following conditions:  Cardiac failure and circulatory failure   Critical care was time spent personally by me on the following activities:  Development of treatment plan with patient or surrogate,  discussions with consultants, evaluation of patient's response to treatment, examination of patient, obtaining history from patient or surrogate, review of old charts, re-evaluation of patient's condition, pulse oximetry, ordering and review of radiographic studies, ordering and review of laboratory studies and ordering and performing treatments and interventions   Care discussed with: admitting provider    (including critical care time)  Medical Decision Making / ED Course I have reviewed the nursing notes for this encounter and the patient's prior records (if available in EHR or on provided paperwork).  Jonathon Stevens was evaluated in Emergency Department on 02/13/2021 for the symptoms described in the history of present illness. He was evaluated in the context of the global COVID-19 pandemic, which necessitated consideration that the patient might be at risk for infection with the SARS-CoV-2 virus that causes COVID-19. Institutional protocols and algorithms that pertain to the evaluation of patients at risk for COVID-19 are in a state of rapid change based on information released by regulatory bodies including the CDC and federal and state organizations. These policies and algorithms were followed during the patient's care in the ED.     AFRVR Soft BPs. Getting IVF. Improving with IVF Will need to start cardizem gtt once BPs are improved. Not necessary to perform cardioversion at this time.  Hematemesis Patient is on xarelto. Last dose the night prior. Hypotension is improving with IVF Abd benign. Hb down 3g since yesterday. Anticipate additional downtrend given timing. Will reverse with Kcentra, will give 1U pRBC. Dr. Ardis Hughs ([facility]) consulted to see patient.  Pertinent labs & imaging results that were available during my care of the patient were reviewed by me and considered in my medical decision making:  Admitted to IM for further work up and management.  Final Clinical  Impression(s) / ED Diagnoses Final  diagnoses:  Atrial fibrillation with RVR (HCC)  Acute upper GI bleed  Hypotension due to hypovolemia  Anemia, blood loss     This chart was dictated using voice recognition software.  Despite best efforts to proofread,  errors can occur which can change the documentation meaning.    Fatima Blank, MD 02/13/21 [phone]

## 2021-02-13 NOTE — ED Notes (Signed)
LB Christella Hartigan paged for consult.

## 2021-02-13 NOTE — H&P (Addendum)
Date: 02/13/2021               Patient Name:  Jonathon Stevens MRN: [id number]  DOB: 1945/09/30 Age / Sex: 75 y.o., male   PCP: Christain Sacramento, MD         Medical Service: Internal Medicine Teaching Service         Attending Physician: Dr. Sid Falcon, MD    First Contact: Dr. Raymondo Band Pager: [phone]  Second Contact: Dr. Alfonse Spruce Pager: [phone]       After Hours (After 5p/  First Contact Pager: [phone]  weekends / holidays): Second Contact Pager: [phone]   Chief Complaint: Lightheadedness, hematemesis  History of Present Illness: Jonathon Stevens is a 75 year old male with a past medical history of paroxysmal atrial fibrillation, prior CVA, hyperlipidemia, gout and COPD presenting with nausea, lightheadedness diaphoresis and hematemesis.  He initially presented on 11/19 for lightheadedness with nausea found to be in A. fib with RVR on EMS arrival. He was given a Cardizem bolus with conversion back to normal sinus rhythm.  He was evaluated in the ED and labs were reassuring and he remained in normal sinus rhythm so patient was discharged home.  A few hours later he woke up from his sleep with diaphoresis, nausea, bloody emesis and 1 episode of bloody watery stool.  EMS was again called and he was found to be back in A. fib with RVR.   Additional Cardizem was not given due to low blood pressures.  He was brought back to the emergency department and noted to have a drop in his hemoglobin and remained in atrial fibrillation with RVR.  Of note, wife reports that he was advised to take 4 baby aspirin initially when EMS was called.  He reports that he was in his usual state of health until yesterday afternoon.  He played pickle ball yesterday morning and forgot to take his metoprolol.  Outside of this he states that he normally takes his medications as prescribed.  Denies any recent illnesses or sick contacts.  States he suddenly started feeling poorly yesterday afternoon with significant  lightheadedness, dizziness and generally feeling ill which prompted him to calling EMS.  States he has never felt symptoms from his atrial fibrillation such as heart palpitations or shortness of breath.  Denies current shortness of breath, chest pain, lightheadedness, dizziness, nausea, fevers, chills, runny nose, cough or congestion.  No history of blood clots.  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  He has never had an episode like this in the past.  States he was diagnosed with A. fib when he had a stroke about 6 years ago.  Denies any residual deficits from his stroke.  States that he had about 90% recovery at the time. States that since he had COVID in January 2021 he has been severely fatigued and that has been his biggest struggle.  Despite this has continued to remain active, swims frequently with his wife.    Reports he had a colonoscopy over 5 years ago and at that time had benign polyps.  He was to have a repeat colonoscopy 5 years later but this was delayed due to [place].  Denies any bloody stools prior to the episode last night.  No family history of colon cancer.  Meds:  Allopurinol 300 mg daily Atorvastatin 10 mg daily Coenzyme Q 10 daily Glucosamine-chondroitin daily Metoprolol tartrate 25 mg twice daily Omega-3 fatty acids 2 tablets  daily Prolia 60 mg injection Rivaroxaban 20 mg daily Stiolto Respimat 2 puffs daily  Allergies: Allergies as of 02/13/2021 - Review Complete 02/13/2021  Allergen Reaction Noted   Cyclobenzaprine Nausea Only 10/12/2017   Indomethacin Rash 07/20/2015   Past Medical History:  Diagnosis Date   Atrial flutter (HCC)    CVA (cerebral infarction)    embolic CVA 09/2013   Diabetes mellitus without complication (HCC)    Gout    HLD (hyperlipidemia)    Hx of echocardiogram    a. ECHO 10/08/13: EF 45-50%, mild hypokinesis of apical myocardium. Mild LA dilation, mild RA dilation;  b.  TEE (10/09/13):  EF 50-55%, normal wall  motion, no LAA clot   Hypertension    Stroke [facility])     Family History: Father had lung cancer.  Mother had breast and cervical cancer.  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  Quit smoking tobacco about 7 years ago.  Smoked about half a pack a day for 10 to 20 years.  Drinks alcohol on social occasions seldomly.  Denies any illicit drug use.  Able to complete all his ADLs.  Review of Systems: A complete ROS was negative except as per HPI.   Physical Exam: Blood pressure (!) 92/53, pulse (!) 113, temperature (!) 97.4 F (36.3 C), temperature source Temporal, resp. rate (!) 22, SpO2 99 %. Physical Exam General: alert, appears stated age, in no acute distress HEENT: Normocephalic, atraumatic, EOM intact, conjunctiva normal CV: Tachycardic, irregularly irregular rhythm Pulm: Clear to auscultation bilaterally, normal work of breathing Abdomen: Soft, nondistended, bowel sounds present, no tenderness to palpation MSK: No lower extremity edema Skin: Warm and dry Neuro: Alert and oriented x3   EKG: personally reviewed my interpretation is regularly irregular, some dropped beats, ST depression   CXR: personally reviewed my interpretation is no acute cardiopulmonary process  Assessment & Plan by Problem: Active Problems:   Paroxysmal atrial fibrillation (HCC)   Tachycardia   Acute blood loss anemia   Hematemesis   Hematochezia  [name] is a 75 year old male with a past medical history of paroxysmal atrial fibrillation, prior CVA, hyperlipidemia, gout and COPD presenting with nausea, lightheadedness diaphoresis and hematemesis.   Tachycardia Possible atrial fibrillation with RVR History of paroxysmal atrial fibrillation Patient initially presented on 11/19 in atrial fibrillation with RVR, converted back to NSR after receiving a Cardizem bolus.  Labs were reassuring and patient was asymptomatic, discharged home.  Few hours later he woke up diaphoretic,  lightheaded and dizzy with bloody emesis and 1 episode of bloody loose watery stool.  He called EMS again and was found to be back in atrial fibrillation with RVR.  Additional Cardizem was not given due to his soft pressures.  He was given IV fluids with improvement in his pressures and diltiazem infusion was started in the ED.  Heart rate still remains uncontrolled fluctuating from the 130s to 150s.  EKG shows regularly irregular rhythm with dropped beats and ST depression. Patient is asymptomatic at this time, denies any chest pain or shortness of breath. Likely in the setting of a GI bleed although patient's symptoms preceded bloody emesis and bloody stool.  Of note he did miss 1 dose of metoprolol yesterday morning.  Low suspicion for infectious etiology or PE.  Wells score 1.5.  Recent TSH and hemoglobin A1c within normal limits.  Last echocardiogram in 2015, unable to see results.  Would favor transitioning from diltiazem to metoprolol for rate control but  will wait for cardiology's recommendations and discontinue any rate controlling agents at this time due to soft pressures.  Will repeat EKG and stat troponin to rule out underlying ACS. -Consulted cardiology, appreciate recommendations -Follow-up troponin and repeat EKG -Discontinue all rate controlling agents due to soft pressures -Follow-up echocardiogram -Follow-up hemoglobin A1c, TSH -Holding anticoagulation in the setting of possible GI bleed  Acute blood loss anemia Hematemesis Hematochezia Patient presented status post 1 episode of bloody emesis and 1 bloody loose watery stool.  Hemoglobin dropped from 12.5-9.5 overnight.  Hypotensive on admission.  No signs of active bleeding on exam.  1 unit PRBC ordered by ED provider and GI consulted for possible upper GI bleed.  Last colonoscopy w/ biopsies was in 2016, report not visible however a PCP note mentions tubular adenomas.  Repeat colonoscopy was to be in 5 years however delayed due to  [facility]. Last dose of Xarelto 11/19, given Kcentra in the ED. No plans for immediate interventions at this time due to uncontrolled HR. If hemoglobin downtrends despite blood transfusion will need to reconsider. -GI on board, appreciate recommendations -1 unit PRBC -Follow-up H&H  -Hold Xarelto -Protonix 40 mg IV twice daily -Monitor hemoglobin every 8 hours -Transfuse for hemoglobin less than 7  Hypotension In the setting of acute blood loss anemia.  Improving with IV fluids.  Will continue to monitor.  AKI on CKD stage IIIa Creatinine 1.6.  Baseline creatinine approximately 1.3.  Likely prerenal in the setting of hypovolemia/acute blood loss. -Trend BMP -Avoid nephrotoxic agents  Mild leukocytosis WBC 12.5.  No signs of infectious etiology.   -Continue to monitor -Trend CBC  History of embolic CVA Hyperlipidemia History of stroke in 2015 with no residual deficits.  Lipid panel from 9/22 within normal limits. -Continue atorvastatin 10 mg daily.  Type 2 DM, well controlled Recent hemoglobin A1c 2 months ago 6.7.  Not on any diabetic medications.   -CBG monitoring  Infrarenal abdominal aortic aneurysm, stable Right common iliac artery aneurysm, stable Follows with Dr. Gwenlyn Saran, last seen 10/17 and at that time noninvasive vascular imaging showed stable abdominal aortic aneurysm measuring 3.1 x 3.2 cm.  Right common iliac artery aneurysm had a 2 mm increase in size.  COPD History of COPD, no PFTs on file.  Patient was prescribed Stiolto Respimat inhaler for 6 months after COVID however is no longer using this medication.  Gout -Continue allopurinol  Osteopenia History of compression fractures.  DEXA scan in 04/2020 showed osteopenia.  Patient started on denosumab and glucosamine-chondroitin.  Chronic fatigue syndrome Patient states that he was diagnosed with chronic fatigue syndrome after COVID in January 2021.  States he has been severely fatigued since this time.  He gets much  more short of breath when completing his ADLs.  Outpatient work-up has been unyielding.  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  Diet: Clear liquids VTE prophylaxis: SCDs Full code  Dispo: Admit patient to Inpatient with expected length of stay greater than 2 midnights.  SignedMike Craze, DO 02/13/2021, 10:52 AM  PagerUP:[phone] After 5pm on weekdays and 1pm on weekends: On Call pager: [phone]

## 2021-02-13 NOTE — Consult Note (Addendum)
Cardiology Consultation:   Patient ID: Jonathon Stevens MRN: [id number]; DOB: 1945/04/08  Admit date: 02/13/2021 Date of Consult: 02/13/2021  PCP:  Christain Sacramento, MD   [facility] Providers Cardiologist:  Larae Grooms, MD        Patient Profile:   Jonathon Stevens is a 75 y.o. male with a hx of atrial fibrillation, atrial flutter, CVA in 2015, diabetes, hyperlipidemia, hypertension who is being seen 02/13/2021 for the evaluation of atrial fibrillation at the request of Gilles Chiquito.  History of Present Illness:   Mr. Bostrom he presented emergency room complaining of nausea, lightheadedness, diaphoresis, hematemesis on 11/19.  He was found to be in rapid atrial fibrillation on arrival.  He was given IV Cardizem and converted to sinus rhythm.  Labs were unremarkable and thus he was discharged.  A few hours later, he woke from sleep with diaphoresis, nausea, bloody emesis x1 and blood very watery stool.  EMS was called again and was found to be in rapid atrial fibrillation.  He was not given Cardizem due to low blood pressures.  He was brought back to the emergency room and was noted to have a drop in his hemoglobin and remained in atrial fibrillation.  Per his wife he was advised to take 4 baby aspirin when he called EMS.  Since being in the emergency room he has done well.  He has no chest pain or shortness of breath.  He is unaware of his atrial fibrillation.  Per the patient, GI has plans to do an upper and lower endoscopy tomorrow if heart rate and blood pressure allow.  Past Medical History:  Diagnosis Date   Atrial flutter ([place])    CVA (cerebral infarction)    embolic CVA 09/8674   Diabetes mellitus without complication (HCC)    Gout    HLD (hyperlipidemia)    Hx of echocardiogram    a. ECHO 10/08/13: EF 45-50%, mild hypokinesis of apical myocardium. Mild LA dilation, mild RA dilation;  b.  TEE (10/09/13):  EF 50-55%, normal wall motion, no LAA clot   Hypertension     Stroke [facility])     Past Surgical History:  Procedure Laterality Date   TEE WITHOUT CARDIOVERSION N/A 10/09/2013   Procedure: TRANSESOPHAGEAL ECHOCARDIOGRAM (TEE);  Surgeon: Pixie Casino, MD;  Location: [facility] ENDOSCOPY;  Service: Cardiovascular;  Laterality: N/A;     Home Medications:  Prior to Admission medications   Medication Sig Start Date End Date Taking? Authorizing Provider  allopurinol (ZYLOPRIM) 300 MG tablet Take 300 mg by mouth daily after breakfast.  04/26/16   [provider]  atorvastatin (LIPITOR) 10 MG tablet TAKE 1 TABLET BY MOUTH DAILY AT 6 PM 01/04/21   Jettie Booze, MD  blood glucose meter kit and supplies KIT Dispense based on patient and insurance preference. Use up to four times daily as directed. (FOR ICD-9 250.00, 250.01). 04/10/19   Elodia Florence., MD  COENZYME Q10 PO Take 1 tablet by mouth daily.     [provider]  GLUCOSAMINE-CHONDROITIN PO Take 1 tablet by mouth daily.     [provider]  metoprolol tartrate (LOPRESSOR) 25 MG tablet TAKE 1 TABLET(25 MG) BY MOUTH TWICE DAILY 01/04/21   Jettie Booze, MD  Multiple Vitamin (MULTIVITAMIN) tablet Take 1 tablet by mouth daily.    [provider]  Omega-3 Fatty Acids (FISH OIL PO) Take 2 tablets by mouth daily.    [provider]  PROLIA 60 MG/ML SOSY injection  Inject into the skin. 05/07/20   [provider]  rivaroxaban (XARELTO) 20 MG TABS tablet TAKE 1 TABLET(20 MG) BY MOUTH DAILY 01/07/21   Jettie Booze, MD  STIOLTO RESPIMAT 2.5-2.5 MCG/ACT AERS Inhale 2 puffs into the lungs daily. 10/13/19   [provider]    Inpatient Medications: Scheduled Meds:  allopurinol  300 mg Oral QPC breakfast   atorvastatin  10 mg Oral Daily   multivitamin with minerals  1 tablet Oral Daily   pantoprazole (PROTONIX) IV  40 mg Intravenous Q12H   Continuous Infusions:  sodium chloride 1,000 mL (02/13/21 0653)   PRN Meds: acetaminophen,  ondansetron (ZOFRAN) IV  Allergies:    Allergies  Allergen Reactions   Cyclobenzaprine Nausea Only   Indomethacin Rash    Social History:   Social History   Socioeconomic History   Marital status: Unknown    Spouse name: [name]   Number of children: 2   Years of education: college   Highest education level: Not on file  Occupational History   Occupation: retired   Tobacco Use   Smoking status: Former    Packs/day: 0.50    Years: 15.00    Pack years: 7.50    Types: Cigarettes    Quit date: 07/13/2015    Years since quitting: 5.5   Smokeless tobacco: Never  Vaping Use   Vaping Use: Never used  Substance and Sexual Activity   Alcohol use: No    Alcohol/week: 0.0 standard drinks   Drug use: No   Sexual activity: Yes  Other Topics Concern   Not on file  Social History Narrative   Patient is married lives with Spouse [name]).   Patient is right handed.   Patient has a Secretary/administrator Degree   Patient drinks 1 cup of coffee daily   Social Determinants of Health   Financial Resource Strain: Not on file  Food Insecurity: Not on file  Transportation Needs: Not on file  Physical Activity: Not on file  Stress: Not on file  Social Connections: Not on file  Intimate Partner Violence: Not on file    Family History:    Family History  Problem Relation Age of Onset   Hypertension Mother    Stroke Mother    Hypertension Father    Heart attack Neg Hx      ROS:  Please see the history of present illness.   All other ROS reviewed and negative.     Physical Exam/Data:   Vitals:   02/13/21 1045 02/13/21 1100 02/13/21 1130 02/13/21 1203  BP: (!) 92/53 (!) 96/59 (!) 113/57 105/64  Pulse: (!) 113 (!) 106 72 63  Resp: (!) _0 Temp:      TempSrc:      SpO2: 99% 99% 97% 98%    Intake/Output Summary (Last 24 hours) at 02/13/2021 1232 Last data filed at 02/13/2021 1133 Gross per 24 hour  Intake 367.93 ml  Output 300 ml  Net 67.93 ml   Last 3 Weights 02/12/2021  01/10/2021 10/15/2020  Weight (lbs) 190 lb 14.7 oz 191 lb 189 lb 9.6 oz  Weight (kg) 86.6 kg 86.637 kg 86.002 kg     There is no height or weight on file to calculate BMI.  General:  Well nourished, well developed, in no acute distress HEENT: normal Neck: no JVD Vascular: No carotid bruits; Distal pulses 2+ bilaterally Cardiac: Tachycardic, irregular, no murmur  Lungs:  clear to auscultation bilaterally, no wheezing, rhonchi or rales  Abd: soft, nontender, no hepatomegaly  Ext: no edema Musculoskeletal:  No deformities, BUE and BLE strength normal and equal Skin: warm and dry  Neuro:  CNs 2-12 intact, no focal abnormalities noted Psych:  Normal affect   EKG:  The EKG was personally reviewed and demonstrates: Atrial fibrillation, rate 139 Telemetry:  Telemetry was personally reviewed and demonstrates: Atrial fibrillation  Relevant CV Studies: TTE pending  Laboratory Data:  High Sensitivity Troponin:  No results for input(s): TROPONINIHS in the last 720 hours.   Chemistry Recent Labs  Lab 02/12/21 2102 02/13/21 0551  NA 136 138  K 4.9 4.6  CL 105 111  CO2 21* 18*  GLUCOSE 197* 228*  BUN 45* 57*  CREATININE 1.36* 1.61*  CALCIUM 8.7* 8.0*  MG 2.0  --   GFRNONAA 54* 44*  ANIONGAP 10 9    No results for input(s): PROT, ALBUMIN, AST, ALT, ALKPHOS, BILITOT in the last 168 hours. Lipids No results for input(s): CHOL, TRIG, HDL, LABVLDL, LDLCALC, CHOLHDL in the last 168 hours.  Hematology Recent Labs  Lab 02/12/21 2102 02/13/21 0551  WBC 10.2 12.5*  RBC 4.05* 3.06*  HGB 12.5* 9.5*  HCT 36.7* 28.3*  MCV 90.6 92.5  MCH 30.9 31.0  MCHC 34.1 33.6  RDW 12.8 13.1  PLT 166 152   Thyroid No results for input(s): TSH, FREET4 in the last 168 hours.  BNP Recent Labs  Lab 02/12/21 2103  BNP 45.4    DDimer No results for input(s): DDIMER in the last 168 hours.   Radiology/Studies:  DG Chest Port 1 View  Result Date: 02/12/2021 CLINICAL DATA:  Shortness of breath  EXAM: PORTABLE CHEST 1 VIEW COMPARISON:  06/19/2019 FINDINGS: The heart size and mediastinal contours are within normal limits. Both lungs are clear. The visualized skeletal structures are unremarkable. IMPRESSION: No active disease. Electronically Signed   By: Ulyses Jarred M.D.   On: 02/12/2021 21:49     Assessment and Plan:   Atrial fibrillation with rapid ventricular response: Anticoagulated with Xarelto 20 mg at home.  He presented with hypotension and rapid atrial fibrillation.  Unfortunately with his GI bleeding, we Destyni Hoppel hold off on anticoagulation until further evaluation by GI.  Blood pressure is stabilized.  Due to that, we Nyair Depaulo start amodarone drip as BP has been low for rate control.  We Meosha Castanon plan for a rhythm control strategy once GI bleeding has been addressed. CHADS2VASC 6. Acute GI bleeding: Supportive care per GI.  Potential plan for endoscopy tomorrow. 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  Baseline 1.36.  Potentially due to GI bleeding versus rapid atrial fibrillation.  Plan per primary team    For questions or updates, please contact [place] Please consult [email or web address] for contact info under    Signed, Tishawna Larouche Meredith Leeds, MD  02/13/2021 12:32 PM

## 2021-02-13 NOTE — Consult Note (Addendum)
Weekend Coverage for Dr. Benson Norway  Consultation  Referring Provider: Dr. Daryll Drown     Primary Care Physician:  Christain Sacramento, MD Primary Gastroenterologist: Dr. Benson Norway (from epic review looks like he saw in office 2021) Reason for Consultation: Hematemesis            HPI:   Jonathon Stevens is a 75 y.o. male with a past medical history of A. fib, prior CVA on Xarelto, hyperlipidemia, gout and COPD who presented to the ER today with a complaint of nausea, lightheadedness, diaphoresis and hematemesis.    Apparently patient initially presented on 11/19 for similar symptoms and found to be in A. fib with RVR, given Cardizem bolus with conversion back to normal sinus rhythm, evaluated in the ED and labs are reassuring and he remained in normal sinus rhythm so patient was discharged.    Today, patient explains that after arriving back home he woke up a few hours later sweating with nausea and had an episode of bloody emesis and one episode of bloody watery stool.  Called the EMS again and was found to be back in A. fib with RVR.  He was brought back to the ER and found to have a drop in his hemoglobin remained in A. fib with RVR.  Last dose of Xarelto was Friday morning 02/11/2021.  Patient's wife tells me he also took 4 baby aspirin the first time he was having chest pain on Saturday, she is not sure if this matters.  Patient tells me he does not have any abdominal or rectal pain.  His nausea and vomiting has subsided since he was given a shot of Phenergan in the ambulance.  Does report that his episode of vomiting was very forceful.  Describes having history of hemorrhoids but "this was more blood than that".  Wife tells me it was bright red.    Does report a colonoscopy over 5 years ago with some benign polyps     Denies fever, chills or weight loss.  ER course: Hemoglobin 9.5 (12.5 on 11/19), A. fib with RVR   07/27/2014 colonoscopy history at [facility], cannot see report  Past Medical History:   Diagnosis Date  . Atrial flutter ([place])   . CVA (cerebral infarction)    embolic CVA 04/7515  . Diabetes mellitus without complication ([place])   . Gout   . HLD (hyperlipidemia)   . Hx of echocardiogram    a. ECHO 10/08/13: EF 45-50%, mild hypokinesis of apical myocardium. Mild LA dilation, mild RA dilation;  b.  TEE (10/09/13):  EF 50-55%, normal wall motion, no LAA clot  . Hypertension   . Stroke [facility])     Past Surgical History:  Procedure Laterality Date  . TEE WITHOUT CARDIOVERSION N/A 10/09/2013   Procedure: TRANSESOPHAGEAL ECHOCARDIOGRAM (TEE);  Surgeon: Pixie Casino, MD;  Location: [facility] ENDOSCOPY;  Service: Cardiovascular;  Laterality: N/A;    Family History  Problem Relation Age of Onset  . Hypertension Mother   . Stroke Mother   . Hypertension Father   . Heart attack Neg Hx     Social History   Tobacco Use  . Smoking status: Former    Packs/day: 0.50    Years: 15.00    Pack years: 7.50    Types: Cigarettes    Quit date: 07/13/2015    Years since quitting: 5.5  . Smokeless tobacco: Never  Vaping Use  . Vaping Use: Never used  Substance Use Topics  . Alcohol use: No  Alcohol/week: 0.0 standard drinks  . Drug use: No    Prior to Admission medications   Medication Sig Start Date End Date Taking? Authorizing Provider  allopurinol (ZYLOPRIM) 300 MG tablet Take 300 mg by mouth daily after breakfast.  04/26/16   [provider]  atorvastatin (LIPITOR) 10 MG tablet TAKE 1 TABLET BY MOUTH DAILY AT 6 PM 01/04/21   Jettie Booze, MD  blood glucose meter kit and supplies KIT Dispense based on patient and insurance preference. Use up to four times daily as directed. (FOR ICD-9 250.00, 250.01). 04/10/19   Elodia Florence., MD  COENZYME Q10 PO Take 1 tablet by mouth daily.     [provider]  GLUCOSAMINE-CHONDROITIN PO Take 1 tablet by mouth daily.     [provider]  metoprolol tartrate (LOPRESSOR) 25 MG tablet TAKE 1 TABLET(25 MG)  BY MOUTH TWICE DAILY 01/04/21   Jettie Booze, MD  Multiple Vitamin (MULTIVITAMIN) tablet Take 1 tablet by mouth daily.    [provider]  Omega-3 Fatty Acids (FISH OIL PO) Take 2 tablets by mouth daily.    [provider]  PROLIA 60 MG/ML SOSY injection Inject into the skin. 05/07/20   [provider]  rivaroxaban (XARELTO) 20 MG TABS tablet TAKE 1 TABLET(20 MG) BY MOUTH DAILY 01/07/21   Jettie Booze, MD  STIOLTO RESPIMAT 2.5-2.5 MCG/ACT AERS Inhale 2 puffs into the lungs daily. 10/13/19   [provider]    Current Facility-Administered Medications  Medication Dose Route Frequency Provider Last Rate Last Admin  . 0.9 %  sodium chloride infusion  1,000 mL Intravenous Continuous Rehman, Areeg N, DO 125 mL/hr at 02/13/21 0653 1,000 mL at 02/13/21 0653  . acetaminophen (TYLENOL) tablet 650 mg  650 mg Oral Q4H PRN Rehman, Areeg N, DO      . allopurinol (ZYLOPRIM) tablet 300 mg  300 mg Oral QPC breakfast Rehman, Areeg N, DO      . atorvastatin (LIPITOR) tablet 10 mg  10 mg Oral Daily Rehman, Areeg N, DO      . Coenzyme Q10   Oral Daily Rehman, Areeg N, DO      . diltiazem (CARDIZEM) 125 mg in dextrose 5% 125 mL (1 mg/mL) infusion  2.5-15 mg/hr Intravenous Continuous Rehman, Areeg N, DO 5 mL/hr at 02/13/21 0831 5 mg/hr at 02/13/21 0831  . Glucosamine-Chondroitin CAPS   Oral Daily Rehman, Areeg N, DO      . multivitamin with minerals tablet 1 tablet  1 tablet Oral Daily Gilles Chiquito B, MD      . ondansetron (ZOFRAN) injection 4 mg  4 mg Intravenous Q6H PRN Rehman, Areeg N, DO       Current Outpatient Medications  Medication Sig Dispense Refill  . allopurinol (ZYLOPRIM) 300 MG tablet Take 300 mg by mouth daily after breakfast.     . atorvastatin (LIPITOR) 10 MG tablet TAKE 1 TABLET BY MOUTH DAILY AT 6 PM 90 tablet 2  . blood glucose meter kit and supplies KIT Dispense based on patient and insurance preference. Use up to four times daily as directed.  (FOR ICD-9 250.00, 250.01). 1 each 0  . COENZYME Q10 PO Take 1 tablet by mouth daily.     Marland Kitchen GLUCOSAMINE-CHONDROITIN PO Take 1 tablet by mouth daily.     . metoprolol tartrate (LOPRESSOR) 25 MG tablet TAKE 1 TABLET(25 MG) BY MOUTH TWICE DAILY 180 tablet 2  . Multiple Vitamin (MULTIVITAMIN) tablet Take 1 tablet by  mouth daily.    . Omega-3 Fatty Acids (FISH OIL PO) Take 2 tablets by mouth daily.    Marland Kitchen PROLIA 60 MG/ML SOSY injection Inject into the skin.    . rivaroxaban (XARELTO) 20 MG TABS tablet TAKE 1 TABLET(20 MG) BY MOUTH DAILY 90 tablet 1  . STIOLTO RESPIMAT 2.5-2.5 MCG/ACT AERS Inhale 2 puffs into the lungs daily.      Allergies as of 02/13/2021 - Review Complete 02/13/2021  Allergen Reaction Noted  . Cyclobenzaprine Nausea Only 10/12/2017  . Indomethacin Rash 07/20/2015     Review of Systems:    Constitutional: No weight loss, fever or chills Skin: No rash  Cardiovascular: +chest pain Respiratory: No SOB  Gastrointestinal: See HPI and otherwise negative Genitourinary: No dysuria  Neurological: No headache, dizziness or syncope Musculoskeletal: No new muscle or joint pain Hematologic: No bruising Psychiatric: No history of depression or anxiety    Physical Exam:  Vital signs in last 24 hours: Temp:  [97.3 F (36.3 C)-98.1 F (36.7 C)] 97.5 F (36.4 C) (11/20 0823) Pulse Rate:  [62-157] 98 (11/20 0831) Resp:  [6-24] 24 (11/20 0831) BP: (70-118)/(32-96) 115/85 (11/20 0830) SpO2:  [92 %-100 %] 99 % (11/20 0831) Weight:  [86.6 kg] 86.6 kg (11/19 2032)   General:   Pleasant Caucasian male appears to be in NAD, Well developed, Well nourished, alert and cooperative Head:  Normocephalic and atraumatic. Eyes:   PEERL, EOMI. No icterus. Conjunctiva pink. Ears:  Normal auditory acuity. Neck:  Supple Throat: Oral cavity and pharynx without inflammation, swelling or lesion.  Lungs: Respirations even and unlabored. Lungs clear to auscultation bilaterally.   No wheezes, crackles,  or rhonchi.  Heart: Normal S1, S2. No MRG. Tachycardic, Irregularly irregular rhythm, No peripheral edema, cyanosis or pallor.  Abdomen:  Soft, nondistended, nontender. No rebound or guarding. Normal bowel sounds. No appreciable masses or hepatomegaly. Rectal:  External: Inflamed external hemorrhoid, residue of blood, maroon in color around rectum; Internal: no mass, no ttp, maroon bloody residue on glove Msk:  Symmetrical without gross deformities. Peripheral pulses intact.  Extremities:  Without edema, no deformity or joint abnormality.  Neurologic:  Alert and  oriented x4;  grossly normal neurologically.  Skin:   Dry and intact without significant lesions or rashes. Psychiatric: Demonstrates good judgement and reason without abnormal affect or behaviors.   LAB RESULTS: Recent Labs    02/12/21 2102 02/13/21 0551  WBC 10.2 12.5*  HGB 12.5* 9.5*  HCT 36.7* 28.3*  PLT 166 152   BMET Recent Labs    02/12/21 2102 02/13/21 0551  NA 136 138  K 4.9 4.6  CL 105 111  CO2 21* 18*  GLUCOSE 197* 228*  BUN 45* 57*  CREATININE 1.36* 1.61*  CALCIUM 8.7* 8.0*    PT/INR Recent Labs    02/12/21 2102  LABPROT 23.1*  INR 2.1*    STUDIES: DG Chest Port 1 View  Result Date: 02/12/2021 CLINICAL DATA:  Shortness of breath EXAM: PORTABLE CHEST 1 VIEW COMPARISON:  06/19/2019 FINDINGS: The heart size and mediastinal contours are within normal limits. Both lungs are clear. The visualized skeletal structures are unremarkable. IMPRESSION: No active disease. Electronically Signed   By: Ulyses Jarred M.D.   On: 02/12/2021 21:49     Impression / Plan:   Impression: 1.  Hematemesis/hematochezia: Describes episode of forceful vomiting early this morning with bright red blood, also had a diarrheal episode with a lot of bright red blood per his wife, rectal exam with residue  of blood but otherwise unrevealing, last colonoscopy in 2016 (cannot see report), hemoglobin decreased as below; consider  lower versus upper GI bleed, hard to discern, it sounds like hematemesis could be from Mallory-Weiss tear given forcefulness of vomiting episode, but blood from rectum was described as bright red 2.  Acute blood loss anemia: Hemoglobin 12.5--> 9.5 overnight; with above 3.  A. fib with RVR: History of A. fib diagnosed 5 years ago with stroke, maintained on Xarelto, last dose 02/12/2019 2 AM 4.  AKI on CKD stage III: Creatinine 1.6, BUN 57 5.  Diabetes type 2 6.  COPD 7.  Chronic fatigue: Diagnosed after COVID in January 2021  Plan: 1.  Agree with transfusing 1 unit PRBCs now and continual monitoring of hemoglobin every 8 hours unless further episodes of GI bleeding witnessed. 2.   For now given A. fib with RVR no plans for immediate endoscopic procedures, but patient will likely benefit from EGD and colonoscopy in the future.  Hopefully over the next 24 to 48 hours we will be able to better discern whether this is likely upper or lower source. 3.  Ordered Pantoprazole 40 mg IV twice daily 4.  Okay with clear diet for now if hospitalist team is okay with him having food  Thank you for your kind consultation, we will continue to follow.  Lavone Nian [facility]  02/13/2021, 8:53 AM   ________________________________________________________________________  Velora Heckler GI MD note:  I personally examined the patient, reviewed the data and agree with the assessment and plan described above.  75 yo man on xarelto for afib, presented with rapid afib.  Had limited hematemesis and BRPBR.  Probably UGI bleeding. NEed to allow xarelto washout (24-48 hours) and then will likely proceed with EGD. Drs. Mann/Hung to assume his care in the morning.     Owens Loffler, MD [facility] Gastroenterology Pager [phone]

## 2021-02-14 ENCOUNTER — Encounter (HOSPITAL_COMMUNITY)
Admission: EM | Disposition: A | Discharge status: Home / Self Care | Payer: Self-pay | Source: Home / Self Care | Attending: Internal Medicine

## 2021-02-14 ENCOUNTER — Inpatient Hospital Stay (HOSPITAL_COMMUNITY): Payer: Medicare Other | Admitting: Anesthesiology

## 2021-02-14 ENCOUNTER — Encounter (HOSPITAL_COMMUNITY): Payer: Self-pay | Admitting: Internal Medicine

## 2021-02-14 DIAGNOSIS — D500 Iron deficiency anemia secondary to blood loss (chronic): Secondary | ICD-10-CM | POA: Diagnosis not present

## 2021-02-14 DIAGNOSIS — D62 Acute posthemorrhagic anemia: Secondary | ICD-10-CM

## 2021-02-14 DIAGNOSIS — I480 Paroxysmal atrial fibrillation: Secondary | ICD-10-CM | POA: Diagnosis not present

## 2021-02-14 DIAGNOSIS — K920 Hematemesis: Secondary | ICD-10-CM

## 2021-02-14 DIAGNOSIS — I4891 Unspecified atrial fibrillation: Secondary | ICD-10-CM | POA: Diagnosis not present

## 2021-02-14 HISTORY — PX: ESOPHAGOGASTRODUODENOSCOPY (EGD) WITH PROPOFOL: SHX5813

## 2021-02-14 HISTORY — PX: HEMOSTASIS CLIP PLACEMENT: SHX6857

## 2021-02-14 LAB — CBC
HCT: 23.2 % — ABNORMAL LOW (ref 39.0–52.0)
HCT: 23.9 % — ABNORMAL LOW (ref 39.0–52.0)
Hemoglobin: 8 g/dL — ABNORMAL LOW (ref 13.0–17.0)
Hemoglobin: 7.8 g/dL — ABNORMAL LOW (ref 13.0–17.0)
MCH: 30.7 pg (ref 26.0–34.0)
MCH: 29.7 pg (ref 26.0–34.0)
MCHC: 34.5 g/dL (ref 30.0–36.0)
MCHC: 32.6 g/dL (ref 30.0–36.0)
MCV: 88.9 fL (ref 80.0–100.0)
MCV: 90.9 fL (ref 80.0–100.0)
Platelets: 99 10*3/uL — ABNORMAL LOW (ref 150–400)
Platelets: 83 10*3/uL — ABNORMAL LOW (ref 150–400)
RBC: 2.61 MIL/uL — ABNORMAL LOW (ref 4.22–5.81)
RBC: 2.63 MIL/uL — ABNORMAL LOW (ref 4.22–5.81)
RDW: 15.7 % — ABNORMAL HIGH (ref 11.5–15.5)
RDW: 15.9 % — ABNORMAL HIGH (ref 11.5–15.5)
WBC: 9.3 10*3/uL (ref 4.0–10.5)
WBC: 9.5 10*3/uL (ref 4.0–10.5)
nRBC: 0 % (ref 0.0–0.2)
nRBC: 0.3 % — ABNORMAL HIGH (ref 0.0–0.2)

## 2021-02-14 LAB — CBC WITH DIFFERENTIAL/PLATELET
Abs Immature Granulocytes: 0.04 10*3/uL (ref 0.00–0.07)
Basophils Absolute: 0 10*3/uL (ref 0.0–0.1)
Basophils Relative: 0 %
Eosinophils Absolute: 0.1 10*3/uL (ref 0.0–0.5)
Eosinophils Relative: 2 %
HCT: 24.2 % — ABNORMAL LOW (ref 39.0–52.0)
Hemoglobin: 8.4 g/dL — ABNORMAL LOW (ref 13.0–17.0)
Immature Granulocytes: 0 %
Lymphocytes Relative: 21 %
Lymphs Abs: 2 10*3/uL (ref 0.7–4.0)
MCH: 30.8 pg (ref 26.0–34.0)
MCHC: 34.7 g/dL (ref 30.0–36.0)
MCV: 88.6 fL (ref 80.0–100.0)
Monocytes Absolute: 0.7 10*3/uL (ref 0.1–1.0)
Monocytes Relative: 7 %
Neutro Abs: 6.7 10*3/uL (ref 1.7–7.7)
Neutrophils Relative %: 70 %
Platelets: 100 10*3/uL — ABNORMAL LOW (ref 150–400)
RBC: 2.73 MIL/uL — ABNORMAL LOW (ref 4.22–5.81)
RDW: 15.5 % (ref 11.5–15.5)
WBC: 9.6 10*3/uL (ref 4.0–10.5)
nRBC: 0 % (ref 0.0–0.2)

## 2021-02-14 LAB — BASIC METABOLIC PANEL
Anion gap: 6 (ref 5–15)
BUN: 28 mg/dL — ABNORMAL HIGH (ref 8–23)
CO2: 21 mmol/L — ABNORMAL LOW (ref 22–32)
Calcium: 7.8 mg/dL — ABNORMAL LOW (ref 8.9–10.3)
Chloride: 113 mmol/L — ABNORMAL HIGH (ref 98–111)
Creatinine, Ser: 1.25 mg/dL — ABNORMAL HIGH (ref 0.61–1.24)
GFR, Estimated: >60 mL/min (ref >60)
Glucose, Bld: 133 mg/dL — ABNORMAL HIGH (ref 70–99)
Potassium: 3.9 mmol/L (ref 3.5–5.1)
Sodium: 140 mmol/L (ref 135–145)

## 2021-02-14 LAB — PREPARE RBC (CROSSMATCH)

## 2021-02-14 LAB — MAGNESIUM: Magnesium: 2 mg/dL (ref 1.7–2.4)

## 2021-02-14 SURGERY — ESOPHAGOGASTRODUODENOSCOPY (EGD) WITH PROPOFOL
Anesthesia: Monitor Anesthesia Care

## 2021-02-14 SURGERY — EGD (ESOPHAGOGASTRODUODENOSCOPY)
Anesthesia: Monitor Anesthesia Care

## 2021-02-14 MED ORDER — PHENYLEPHRINE 40 MCG/ML (10ML) SYRINGE FOR IV PUSH (FOR BLOOD PRESSURE SUPPORT)
PREFILLED_SYRINGE | INTRAVENOUS | Status: DC | PRN
  Administered 2021-02-14: 120 ug via INTRAVENOUS

## 2021-02-14 MED ORDER — POTASSIUM CHLORIDE 10 MEQ/100ML IV SOLN
10.0000 meq | INTRAVENOUS | Status: AC
  Administered 2021-02-14 (×3): 10 meq via INTRAVENOUS
  Filled 2021-02-14: qty 100

## 2021-02-14 MED ORDER — PROPOFOL 500 MG/50ML IV EMUL
INTRAVENOUS | Status: DC | PRN
  Administered 2021-02-14: 100 ug/kg/min via INTRAVENOUS

## 2021-02-14 MED ORDER — SODIUM CHLORIDE 0.9% IV SOLUTION: Freq: Once | INTRAVENOUS | Status: AC

## 2021-02-14 MED ORDER — LIDOCAINE 2% (20 MG/ML) 5 ML SYRINGE
INTRAMUSCULAR | Status: DC | PRN
  Administered 2021-02-14: 40 mg via INTRAVENOUS

## 2021-02-14 MED ORDER — LACTATED RINGERS IV SOLN: INTRAVENOUS | Status: DC | PRN

## 2021-02-14 SURGICAL SUPPLY — 14 items

## 2021-02-14 NOTE — Progress Notes (Signed)
Progress Note  Patient Name: Jonathon Stevens Date of Encounter: 02/14/2021  [facility] Cardiologist: Larae Grooms, MD   Subjective   Feeling fine.  Denies chest pain or shortness of breath.  Eager to find out his plan for GI bleed work-up.  Inpatient Medications    Scheduled Meds:  allopurinol  300 mg Oral QPC breakfast   atorvastatin  10 mg Oral Daily   multivitamin with minerals  1 tablet Oral Daily   pantoprazole (PROTONIX) IV  40 mg Intravenous Q12H   Continuous Infusions:  sodium chloride 1,000 mL (02/14/21 0025)   amiodarone 30 mg/hr (02/14/21 [id number])   diltiazem (CARDIZEM) infusion Stopped (02/13/21 1426)   potassium chloride     PRN Meds: acetaminophen, ondansetron (ZOFRAN) IV   Vital Signs    Vitals:   02/14/21 0016 02/14/21 0509 02/14/21 0606 02/14/21 0755  BP: 112/60  123/62 107/64  Pulse: 99  100 94  Resp: 17  18 19   Temp: 97.8 F (36.6 C)  98.5 F (36.9 C) 98.3 F (36.8 C)  TempSrc: Oral  Oral   SpO2: 99%  99% 97%  Weight:      Height:  5\' 10"  (1.778 m)      Intake/Output Summary (Last 24 hours) at 02/14/2021 0841 Last data filed at 02/14/2021 0400 Gross per 24 hour  Intake 5488.64 ml  Output 600 ml  Net 4888.64 ml   Last 3 Weights 02/13/2021 02/12/2021 01/10/2021  Weight (lbs) 198 lb 1.6 oz 190 lb 14.7 oz 191 lb  Weight (kg) 89.858 kg 86.6 kg 86.637 kg      Telemetry    Atrial fibrillation.  Rate less than 100 bpm.  2.1-second pause.- Personally Reviewed  ECG    02/14/2021: Atrial fibrillation.  Rate 74 bpm. - Personally Reviewed  Physical Exam   GEN: No acute distress.   Neck: No JVD Cardiac: Irregularly irregular, no murmurs, rubs, or gallops.  Respiratory: Clear to auscultation bilaterally. GI: Soft, nontender, non-distended  MS: No edema; No deformity. Neuro:  Nonfocal  Psych: Normal affect   Labs    High Sensitivity Troponin:   Recent Labs  Lab 02/13/21 1201 02/13/21 1415  TROPONINIHS 13 17      Chemistry Recent Labs  Lab 02/12/21 2102 02/13/21 0551 02/14/21 0032  NA 136 138 140  K 4.9 4.6 3.9  CL 105 111 113*  CO2 21* 18* 21*  GLUCOSE 197* 228* 133*  BUN 45* 57* 28*  CREATININE 1.36* 1.61* 1.25*  CALCIUM 8.7* 8.0* 7.8*  MG 2.0  --   --   GFRNONAA 54* 44* >60  ANIONGAP 10 9 6     Lipids No results for input(s): CHOL, TRIG, HDL, LABVLDL, LDLCALC, CHOLHDL in the last 168 hours.  Hematology Recent Labs  Lab 02/13/21 1415 02/13/21 2033 02/14/21 0032  WBC 10.4 9.6 9.3  RBC 2.91* 2.73* 2.61*  HGB 8.7* 8.4* 8.0*  HCT 26.3* 24.2* 23.2*  MCV 90.4 88.6 88.9  MCH 29.9 30.8 30.7  MCHC 33.1 34.7 34.5  RDW 15.1 15.5 15.7*  PLT 114* 100* 99*   Thyroid  Recent Labs  Lab 02/13/21 1415  TSH 1.247    BNP Recent Labs  Lab 02/12/21 2103  BNP 45.4    DDimer No results for input(s): DDIMER in the last 168 hours.   Radiology    DG Chest Port 1 View  Result Date: 02/12/2021 CLINICAL DATA:  Shortness of breath EXAM: PORTABLE CHEST 1 VIEW COMPARISON:  06/19/2019 FINDINGS: The heart size and  mediastinal contours are within normal limits. Both lungs are clear. The visualized skeletal structures are unremarkable. IMPRESSION: No active disease. Electronically Signed   By: Ulyses Jarred M.D.   On: 02/12/2021 21:49   ECHOCARDIOGRAM COMPLETE  Result Date: 02/13/2021    ECHOCARDIOGRAM REPORT   Patient Name:   Jonathon Stevens Date of Exam: 02/13/2021 Medical Rec #:  [id number]       Height:       70.0 in Accession #:    [id number]      Weight:       190.9 lb Date of Birth:  1945/08/30       BSA:          2.047 m Patient Age:    75 years        BP:           113/57 mmHg Patient Gender: M               HR:           107 bpm. Exam Location:  Inpatient Procedure: 2D Echo, Cardiac Doppler and Color Doppler Indications:    I48.1 Persistent atrial fibrillation  History:        Patient has no prior history of Echocardiogram examinations.                 Arrythmias:Atrial Fibrillation and  Tachycardia.  Sonographer:    Glo Herring Referring Phys: [place]  1. Left ventricular ejection fraction, by estimation, is 70 to 75%. The left ventricle has hyperdynamic function. The left ventricle has no regional wall motion abnormalities. There is mild left ventricular hypertrophy. Left ventricular diastolic function could not be evaluated.  2. Right ventricular systolic function is normal. The right ventricular size is normal.  3. Left atrial size was moderately dilated.  4. The mitral valve is normal in structure. Mild mitral valve regurgitation. No evidence of mitral stenosis.  5. The aortic valve is tricuspid. Aortic valve regurgitation is not visualized. Aortic valve sclerosis is present, with no evidence of aortic valve stenosis.  6. The inferior vena cava is normal in size with greater than 50% respiratory variability, suggesting right atrial pressure of 3 mmHg. FINDINGS  Left Ventricle: Left ventricular ejection fraction, by estimation, is 70 to 75%. The left ventricle has hyperdynamic function. The left ventricle has no regional wall motion abnormalities. The left ventricular internal cavity size was normal in size. There is mild left ventricular hypertrophy. Left ventricular diastolic function could not be evaluated due to atrial fibrillation. Left ventricular diastolic function could not be evaluated. Right Ventricle: The right ventricular size is normal. Right ventricular systolic function is normal. Left Atrium: Left atrial size was moderately dilated. Right Atrium: Right atrial size was normal in size. Pericardium: There is no evidence of pericardial effusion. Mitral Valve: The mitral valve is normal in structure. Mild mitral annular calcification. Mild mitral valve regurgitation. No evidence of mitral valve stenosis. Tricuspid Valve: The tricuspid valve is normal in structure. Tricuspid valve regurgitation is trivial. No evidence of tricuspid stenosis. Aortic Valve: The  aortic valve is tricuspid. Aortic valve regurgitation is not visualized. Aortic valve sclerosis is present, with no evidence of aortic valve stenosis. Aortic valve mean gradient measures 4.3 mmHg. Aortic valve peak gradient measures 7.2  mmHg. Aortic valve area, by VTI measures 2.53 cm. Pulmonic Valve: The pulmonic valve was normal in structure. Pulmonic valve regurgitation is not visualized. No evidence of pulmonic stenosis. Aorta: The aortic root is  normal in size and structure. Venous: The inferior vena cava is normal in size with greater than 50% respiratory variability, suggesting right atrial pressure of 3 mmHg. IAS/Shunts: No atrial level shunt detected by color flow Doppler.  LEFT VENTRICLE PLAX 2D LVIDd:         3.80 cm   Diastology LVIDs:         2.10 cm   LV e' medial:    6.71 cm/s LV PW:         1.20 cm   LV E/e' medial:  20.6 LV IVS:        1.20 cm   LV e' lateral:   9.88 cm/s LVOT diam:     2.00 cm   LV E/e' lateral: 14.0 LV SV:         66 LV SV Index:   32 LVOT Area:     3.14 cm  RIGHT VENTRICLE          IVC RV Basal diam:  3.30 cm  IVC diam: 1.90 cm LEFT ATRIUM             Index        RIGHT ATRIUM           Index LA diam:        4.90 cm 2.39 cm/m   RA Area:     17.80 cm LA Vol (A2C):   58.8 ml 28.73 ml/m  RA Volume:   48.60 ml  23.75 ml/m LA Vol (A4C):   74.0 ml 36.16 ml/m LA Biplane Vol: 71.1 ml 34.74 ml/m  AORTIC VALVE AV Area (Vmax):    2.65 cm AV Area (Vmean):   2.57 cm AV Area (VTI):     2.53 cm AV Vmax:           134.00 cm/s AV Vmean:          96.433 cm/s AV VTI:            0.261 m AV Peak Grad:      7.2 mmHg AV Mean Grad:      4.3 mmHg LVOT Vmax:         113.00 cm/s LVOT Vmean:        78.900 cm/s LVOT VTI:          0.210 m LVOT/AV VTI ratio: 0.81  AORTA Ao Root diam: 3.10 cm Ao Asc diam:  2.90 cm MITRAL VALVE MV Area (PHT): 6.17 cm     SHUNTS MV Decel Time: 123 msec     Systemic VTI:  0.21 m MV E velocity: 138.00 cm/s  Systemic Diam: 2.00 cm Olga Millers MD Electronically signed  by Olga Millers MD Signature Date/Time: 02/13/2021/1:40:16 PM    Final     Cardiac Studies   Echo 02/13/21:  1. Left ventricular ejection fraction, by estimation, is 70 to 75%. The  left ventricle has hyperdynamic function. The left ventricle has no  regional wall motion abnormalities. There is mild left ventricular  hypertrophy. Left ventricular diastolic  function could not be evaluated.   2. Right ventricular systolic function is normal. The right ventricular  size is normal.   3. Left atrial size was moderately dilated.   4. The mitral valve is normal in structure. Mild mitral valve  regurgitation. No evidence of mitral stenosis.   5. The aortic valve is tricuspid. Aortic valve regurgitation is not  visualized. Aortic valve sclerosis is present, with no evidence of aortic  valve stenosis.   6. The  inferior vena cava is normal in size with greater than 50%  respiratory variability, suggesting right atrial pressure of 3 mmHg.   Patient Profile     75 y.o. male with PAF, atrial flutter, CVA in 2015, diabetes, CKD 3, hypertension, and hyperlipidemia admitted with A. fib with RVR  Assessment & Plan    #Atrial fibrillation with RVR: He is chronically anticoagulated with Xarelto.  Admitted with GI bleed so Xarelto is now on hold.  GI evaluation pending.  Blood pressure is low so he was started on amiodarone.  Diltiazem had to be stopped due to hypotension.  CHA2DS2-VASc 6.  # GI bleed: Xarelto on hold as above.  Hemoglobin continues to trend down to 8.  His baseline was 17 a year ago and 12.5 on admission.  Possible endoscopy today.  Recommend maintaining a hemoglobin greater than 8.  # CKD 3a:  Baseline creatinine around 1.3.  It trended up to 1.6 in the setting of A. fib with RVR and bleeding.  Now back down to 1.2 with IV fluids and supportive care.      For questions or updates, please contact [place] Please consult [email or web address] for contact info under         Signed, Skeet Latch, MD  02/14/2021, 8:41 AM

## 2021-02-14 NOTE — Anesthesia Postprocedure Evaluation (Signed)
Anesthesia Post Note  Patient: Jonathon Stevens  Procedure(s) Performed: ESOPHAGOGASTRODUODENOSCOPY (EGD) WITH PROPOFOL HEMOSTASIS CLIP PLACEMENT     Patient location during evaluation: PACU Anesthesia Type: MAC Level of consciousness: awake and alert Pain management: pain level controlled Vital Signs Assessment: post-procedure vital signs reviewed and stable Respiratory status: spontaneous breathing, nonlabored ventilation, respiratory function stable and patient connected to nasal cannula oxygen Cardiovascular status: stable and blood pressure returned to baseline Postop Assessment: no apparent nausea or vomiting Anesthetic complications: no   No notable events documented.  Last Vitals:  Vitals:   02/14/21 1320 02/14/21 1333  BP: (!) 102/52 113/90  Pulse: 66   Resp: 10 15  Temp:  36.9 C  SpO2: 99%     Last Pain:  Vitals:   02/14/21 1333  TempSrc: Oral  PainSc:                  Jonathon Stevens

## 2021-02-14 NOTE — Hospital Course (Addendum)
Feeling ok this morning Going to bathroom frequently - urinating No gas or BM yet, no n/v Appetite is not back yet - but did eat dinner and breakfast  Some SOB, nothing out of ordinary No cp, palpitations   Plan: - discussed egd results:  - LA Grade A reflux esophagitis with no bleeding. Clip (MR conditional) was placed. - 4 cm hiatal hernia. - Normal stomach - Erythematous duodenopathy   - can start xarelto from GI prospective, talk w cards bc patient wants to go home - pt would like to restart xarelto  - amio infusion --> oral??  Recommend follow up with pcp 1 week after discharge to get CBC

## 2021-02-14 NOTE — Anesthesia Preprocedure Evaluation (Addendum)
Anesthesia Evaluation  Patient identified by MRN, date of birth, ID band Patient awake    Reviewed: Allergy & Precautions, H&P , NPO status , Patient's Chart, lab work & pertinent test results  Airway Mallampati: II  TM Distance: >3 FB Neck ROM: Full    Dental no notable dental hx. (+) Edentulous Upper, Edentulous Lower   Pulmonary COPD, former smoker,    Pulmonary exam normal breath sounds clear to auscultation       Cardiovascular hypertension, Pt. on medications + dysrhythmias Atrial Fibrillation  Rhythm:Regular Rate:Normal  ECHO 11/22 1. Left ventricular ejection fraction, by estimation, is 70 to 75%. The  left ventricle has hyperdynamic function. The left ventricle has no  regional wall motion abnormalities. There is mild left ventricular  hypertrophy. Left ventricular diastolic  function could not be evaluated.  2. Right ventricular systolic function is normal. The right ventricular  size is normal.  3. Left atrial size was moderately dilated.  4. The mitral valve is normal in structure. Mild mitral valve  regurgitation. No evidence of mitral stenosis.  5. The aortic valve is tricuspid. Aortic valve regurgitation is not  visualized. Aortic valve sclerosis is present, with no evidence of aortic  valve stenosis.  6. The inferior vena cava is normal in size with greater than 50%  respiratory variability, suggesting right atrial pressure of 3 mmHg.    Neuro/Psych  Neuromuscular disease CVA negative psych ROS   GI/Hepatic Neg liver ROS, GERD  Medicated,  Endo/Other  diabetes  Renal/GU negative Renal ROS  negative genitourinary   Musculoskeletal negative musculoskeletal ROS (+)   Abdominal   Peds negative pediatric ROS (+)  Hematology  (+) Blood dyscrasia, anemia ,   Anesthesia Other Findings   Reproductive/Obstetrics negative OB ROS                            Anesthesia  Physical Anesthesia Plan  ASA: 3  Anesthesia Plan: MAC   Post-op Pain Management:    Induction: Intravenous  PONV Risk Score and Plan:   Airway Management Planned: Mask and Natural Airway  Additional Equipment:   Intra-op Plan:   Post-operative Plan:   Informed Consent:   Plan Discussed with:   Anesthesia Plan Comments:         Anesthesia Quick Evaluation

## 2021-02-14 NOTE — Progress Notes (Signed)
HD#1 SUBJECTIVE:  Patient Summary: [name] is a 75 year old male with a past medical history of paroxysmal atrial fibrillation, prior CVA, hyperlipidemia, gout and COPD, admitted for Afib with RVR and an episode of hematochezia and hematemesis.   Overnight Events: HR well controlled with amio infusion  Interim History: Patient seen and assessed at bedside this morning. He denies experiencing any chest pain or SoB. He continues to deny experiencing any bloody vomiting or BM since admission. He denies NSAID use and reports only infrequent alcohol use. He does report some GERD symptoms that occur intermittently.  OBJECTIVE:  Vital Signs: Vitals:   02/14/21 0606 02/14/21 0755 02/14/21 1125 02/14/21 1154  BP: 123/62 107/64 (!) 120/58 (!) 147/62  Pulse: 100 94 90 95  Resp: 18 19 16 16   Temp: 98.5 F (36.9 C) 98.3 F (36.8 C) 98.1 F (36.7 C) 98 F (36.7 C)  TempSrc: Oral   Temporal  SpO2: 99% 97% 95% 96%  Weight:      Height:       Supplemental O2: Room Air SpO2: 96 %   Intake/Output Summary (Last 24 hours) at 02/14/2021 1256 Last data filed at 02/14/2021 0400 Gross per 24 hour  Intake 5120.71 ml  Output 300 ml  Net 4820.71 ml   Net IO Since Admission: 4,888.64 mL [02/14/21 1256]  Physical Exam: Physical Exam Vitals reviewed.  Cardiovascular:     Rate and Rhythm: Normal rate. Rhythm irregular.     Heart sounds: No murmur heard.    Comments: Warm extremities Pulmonary:     Effort: Pulmonary effort is normal.     Breath sounds: Normal breath sounds.  Abdominal:     General: Bowel sounds are normal.     Palpations: Abdomen is soft.     Tenderness: There is no abdominal tenderness.  Neurological:     Mental Status: He is alert.    Patient Lines/Drains/Airways Status     Active Line/Drains/Airways     Name Placement date Placement time Site Days   Peripheral IV 02/13/21 20 G Posterior;Right Hand 02/13/21  0644  Hand  1   Peripheral IV 02/13/21 18 G  Distal;Left;Posterior Forearm 02/13/21  0649  Forearm  1             ASSESSMENT/PLAN:  Assessment: [name] is a 75 year old male with a past medical history of paroxysmal atrial fibrillation, prior CVA, hyperlipidemia, gout and COPD, admitted for Afib with RVR and an episode of hematochezia and hematemesis.   Plan: #Afib with RVR #Hx paroxysmal afib Patient's heart rate has improved significantly, down to 90's with addition of amio yesterday evening. SBP stable from 107-123, MAPs >70. Repeat EKG this AM reassuring. Discussion with patient regarding risk/benefit of restarting Xarelto pending EGD results. Cause of afib at this popint appears to be ABLA, echo negative for structural/valvular dz, TSH WNL.  -Cards following, appreciate recs -Continue amio infusion -Continue to hold Xarelto pending EGD results -mIVF NS at 125 mL/hr  #Acute blood loss anemia #Hematemesis #Hematochezia No recurrence since admission. Hgb has dropped from 12.5 on admission to 8.0 this AM (over 2 days). Patient has been NPO for EGD today.  -GI consult, appreciate recs -EGD at noon -Recheck hgb afterwards, possible transfusion  Other problems, chronic and stable: -AKI (resolved) on CKD stage IIIa -Leukocytosis (resolved) -History of embolic CVA -Hyperlipidemia -Type 2 DM, well controlled -Infrarenal abdominal aortic aneurysm, stable -Right common iliac artery aneurysm, stable -COPD -Gout -Osteopenia -Chronic fatigue syndrome  Best Practice:  Diet: NPO IVF: Fluids: 0.9NS, Rate: 125 mL/hr VTE: SCDs Start: 02/13/21 0831 Code: FULL DISPO: Anticipated discharge home  Signature: Corky Sox, MD PGY-1 Pager: [phone]

## 2021-02-14 NOTE — Op Note (Signed)
[facility] Patient Name: Jonathon Stevens Procedure Date : 02/14/2021 MRN: [id number] Attending MD: Carol Ada , MD Date of Birth: [birth year] CSN: [id number] Age: 75 Admit Type: Inpatient Procedure:                Upper GI endoscopy Indications:              Hematemesis Providers:                Carol Ada, MD, Luan Moore, Technician,                            Rhae Lerner, CRNA, Burtis Junes, RN Referring MD:              Medicines:                Propofol per Anesthesia Complications:            No immediate complications. Estimated Blood Loss:     Estimated blood loss: none. Procedure:                Pre-Anesthesia Assessment:                           - Prior to the procedure, a History and Physical                            was performed, and patient medications and                            allergies were reviewed. The patient's tolerance of                            previous anesthesia was also reviewed. The risks                            and benefits of the procedure and the sedation                            options and risks were discussed with the patient.                            All questions were answered, and informed consent                            was obtained. Prior Anticoagulants: The patient has                            taken no previous anticoagulant or antiplatelet                            agents. ASA Grade Assessment: II - A patient with                            mild systemic disease. After reviewing the risks  and benefits, the patient was deemed in                            satisfactory condition to undergo the procedure.                           - Sedation was administered by an anesthesia                            professional. Deep sedation was attained.                           After obtaining informed consent, the endoscope was                            passed under direct vision.  Throughout the                            procedure, the patient's blood pressure, pulse, and                            oxygen saturations were monitored continuously. The                            [id number]-H190 ([id number] Olympus endoscope was introduced                            through the mouth, and advanced to the second part                            of duodenum. The upper GI endoscopy was                            accomplished without difficulty. The patient                            tolerated the procedure well. Scope In: Scope Out: Findings:      LA Grade A (one or more mucosal breaks less than 5 mm, not extending       between tops of 2 mucosal folds) esophagitis with no bleeding was found       at the gastroesophageal junction. For hemostasis, one hemostatic clip       was successfully placed (MR conditional). There was no bleeding at the       end of the procedure.      A 4 cm hiatal hernia was present.      The stomach was normal.      Patchy mildly erythematous mucosa without active bleeding and with no       stigmata of bleeding was found in the duodenal bulb.      During the initial passage of the endoscope through the GE junction only       showed a 4 cm hiatal hernia. In the duodenal bulb, at the transition       point between D1 and D2 a thickened fold was identified. There was  suspicion for an ulcer in this area. No ulcer was identified while using       a cold biopsy forcep to stretch out the fold. Retroflexion in this area       did not provide a better view of the mucosa. During the withdrawal,       close inspection of the GE junction revealed a focal area of       esophagitis, which revealed a punctate visible vessel. This was the       source of bleeding for the patient. The area was hemoclipped with one       clip and the area was secured. Impression:               - LA Grade A reflux esophagitis with no bleeding.                            Clip (MR  conditional) was placed.                           - 4 cm hiatal hernia.                           - Normal stomach.                           - Erythematous duodenopathy.                           - No specimens collected. Recommendation:           - Return patient to hospital ward for ongoing care.                           - Resume regular diet.                           - Continue present medications.                           - Follow HGB and transfuse if necessary.                           - PPI QD indefinitely. Procedure Code(s):        --- Professional ---                           [phone], Esophagogastroduodenoscopy, flexible,                            transoral; with control of bleeding, any method Diagnosis Code(s):        --- Professional ---                           K21.00, Gastro-esophageal reflux disease with                            esophagitis, without bleeding  K44.9, Diaphragmatic hernia without obstruction or                            gangrene                           K31.89, Other diseases of stomach and duodenum                           K92.0, Hematemesis CPT copyright 2019 [employer]. All rights reserved. The codes documented in this report are preliminary and upon coder review may  be revised to meet current compliance requirements. Carol Ada, MD Carol Ada, MD 02/14/2021 1:12:04 PM This report has been signed electronically. Number of Addenda: 0

## 2021-02-14 NOTE — Interval H&P Note (Signed)
History and Physical Interval Note:  02/14/2021 12:01 PM  Jonathon Stevens  has presented today for surgery, with the diagnosis of gi bleeding.  The various methods of treatment have been discussed with the patient and family. After consideration of risks, benefits and other options for treatment, the patient has consented to  Procedure(s): ESOPHAGOGASTRODUODENOSCOPY (EGD) WITH PROPOFOL (N/A) as a surgical intervention.  The patient's history has been reviewed, patient examined, no change in status, stable for surgery.  I have reviewed the patient's chart and labs.  Questions were answered to the patient's satisfaction.     Allysha Tryon D

## 2021-02-14 NOTE — Anesthesia Procedure Notes (Signed)
Procedure Name: MAC Date/Time: 02/14/2021 12:28 PM Performed by: Kyung Rudd, CRNA Pre-anesthesia Checklist: Patient identified, Emergency Drugs available, Suction available and Patient being monitored Patient Re-evaluated:Patient Re-evaluated prior to induction Oxygen Delivery Method: Nasal cannula Induction Type: IV induction Placement Confirmation: positive ETCO2 Dental Injury: Teeth and Oropharynx as per pre-operative assessment

## 2021-02-14 NOTE — Transfer of Care (Signed)
Immediate Anesthesia Transfer of Care Note  Patient: Jonathon Stevens  Procedure(s) Performed: ESOPHAGOGASTRODUODENOSCOPY (EGD) WITH PROPOFOL HEMOSTASIS CLIP PLACEMENT  Patient Location: Endoscopy Unit  Anesthesia Type:MAC  Level of Consciousness: drowsy  Airway & Oxygen Therapy: Patient Spontanous Breathing  Post-op Assessment: Report given to RN and Post -op Vital signs reviewed and stable  Post vital signs: Reviewed and stable  Last Vitals:  Vitals Value Taken Time  BP    Temp    Pulse    Resp    SpO2      Last Pain:  Vitals:   02/14/21 1154  TempSrc: Temporal  PainSc: 0-No pain         Complications: No notable events documented.

## 2021-02-15 LAB — BASIC METABOLIC PANEL
Anion gap: 8 (ref 5–15)
BUN: 17 mg/dL (ref 8–23)
CO2: 19 mmol/L — ABNORMAL LOW (ref 22–32)
Calcium: 8.2 mg/dL — ABNORMAL LOW (ref 8.9–10.3)
Chloride: 111 mmol/L (ref 98–111)
Creatinine, Ser: 1.2 mg/dL (ref 0.61–1.24)
GFR, Estimated: >60 mL/min (ref >60)
Glucose, Bld: 129 mg/dL — ABNORMAL HIGH (ref 70–99)
Potassium: 3.6 mmol/L (ref 3.5–5.1)
Sodium: 138 mmol/L (ref 135–145)

## 2021-02-15 LAB — CBC
HCT: 26 % — ABNORMAL LOW (ref 39.0–52.0)
Hemoglobin: 8.8 g/dL — ABNORMAL LOW (ref 13.0–17.0)
MCH: 30.1 pg (ref 26.0–34.0)
MCHC: 33.8 g/dL (ref 30.0–36.0)
MCV: 89 fL (ref 80.0–100.0)
Platelets: 90 10*3/uL — ABNORMAL LOW (ref 150–400)
RBC: 2.92 MIL/uL — ABNORMAL LOW (ref 4.22–5.81)
RDW: 15.1 % (ref 11.5–15.5)
WBC: 9.1 10*3/uL (ref 4.0–10.5)
nRBC: 0 % (ref 0.0–0.2)

## 2021-02-15 MED ORDER — POTASSIUM CHLORIDE 20 MEQ PO PACK
40.0000 meq | PACK | Freq: Once | ORAL | Status: AC
  Administered 2021-02-15: 40 meq via ORAL
  Filled 2021-02-15: qty 2

## 2021-02-15 MED ORDER — PANTOPRAZOLE SODIUM 40 MG PO TBEC
40.0000 mg | DELAYED_RELEASE_TABLET | Freq: Every day | ORAL | Status: DC
  Administered 2021-02-15 – 2021-02-16 (×2): 40 mg via ORAL
  Filled 2021-02-15 (×2): qty 1

## 2021-02-15 MED ORDER — METOPROLOL TARTRATE 25 MG PO TABS
25.0000 mg | ORAL_TABLET | Freq: Two times a day (BID) | ORAL | Status: DC
  Administered 2021-02-15 – 2021-02-16 (×3): 25 mg via ORAL
  Filled 2021-02-15 (×3): qty 1

## 2021-02-15 MED ORDER — RIVAROXABAN 20 MG PO TABS
20.0000 mg | ORAL_TABLET | Freq: Every day | ORAL | Status: DC
  Administered 2021-02-15 – 2021-02-16 (×2): 20 mg via ORAL
  Filled 2021-02-15 (×2): qty 1

## 2021-02-15 NOTE — Progress Notes (Signed)
Mobility Specialist Progress Note    02/15/21 1645  Mobility  Activity Ambulated in hall  Level of Assistance Independent  Assistive Device None  Distance Ambulated (ft) 260 ft  Mobility Ambulated independently in hallway  Mobility Response Tolerated well  Mobility performed by Mobility specialist  Bed Position Chair  $Mobility charge 1 Mobility   Pt received in room and agreeable. No complaints on walk. Had slight DOE. Returned to chair with call bell in reach.   [facility] Mobility Specialist  M.S. Primary Phone: [phone] M.S. Secondary Phone: [phone]

## 2021-02-15 NOTE — Discharge Instructions (Addendum)
You were admitted for a gastrointestinal bleed and a fast heart rate (atrial fibrillation with rapid ventricular rate). These have both resolved. You can resume taking your home Xarelto and start taking daily Protonix. Please see your PCP within 7 days of discharge.   Regarding your right arm swelling, we think it was due to an IV leaking fluid into your subcutaneous tissue. Some of the heart rate controlling medicine (amiodarone) may have been trapped there. We talked with our pharmacist and cardiologist and they feel you can still be discharged safely today. Please return if you experience and increase in redness/swelling or develop a numbness or coldness in your extremity.

## 2021-02-15 NOTE — Progress Notes (Signed)
Progress Note  Patient Name: Jonathon Stevens Date of Encounter: 02/15/2021  [facility] Cardiologist: Larae Grooms, MD   Subjective   Feeling fine.  Denies chest pain or palpitations.  Breathing stable.   Inpatient Medications    Scheduled Meds:  allopurinol  300 mg Oral QPC breakfast   atorvastatin  10 mg Oral Daily   metoprolol tartrate  25 mg Oral BID   multivitamin with minerals  1 tablet Oral Daily   pantoprazole  40 mg Oral Daily   Continuous Infusions:   PRN Meds: acetaminophen, ondansetron (ZOFRAN) IV   Vital Signs    Vitals:   02/14/21 2318 02/15/21 0322 02/15/21 0731 02/15/21 1152  BP: (!) 115/59 114/78 98/62 140/72  Pulse: 75 87 84 (!) 111  Resp: 20 (!) 21 15 18   Temp: 98.2 F (36.8 C) 97.9 F (36.6 C) 97.9 F (36.6 C) 97.8 F (36.6 C)  TempSrc: Oral Oral Oral Oral  SpO2: 96% 95% 98% 97%  Weight:      Height:        Intake/Output Summary (Last 24 hours) at 02/15/2021 1310 Last data filed at 02/15/2021 1254 Gross per 24 hour  Intake 1257.03 ml  Output 1 ml  Net 1256.03 ml   Last 3 Weights 02/13/2021 02/12/2021 01/10/2021  Weight (lbs) 198 lb 1.6 oz 190 lb 14.7 oz 191 lb  Weight (kg) 89.858 kg 86.6 kg 86.637 kg      Telemetry    Atrial fibrillation.  Rate mostly less than 100 bpm.  2.4-second pause.- Personally Reviewed  ECG    02/14/2021: Atrial fibrillation.  Rate 74 bpm. - Personally Reviewed  Physical Exam   GEN: No acute distress.   Neck: No JVD Cardiac: Irregularly irregular, no murmurs, rubs, or gallops.  Respiratory: Clear to auscultation bilaterally. GI: Soft, nontender, non-distended  MS: No edema; No deformity. Neuro:  Nonfocal  Psych: Normal affect   Labs    High Sensitivity Troponin:   Recent Labs  Lab 02/13/21 1201 02/13/21 1415  TROPONINIHS 13 17     Chemistry Recent Labs  Lab 02/12/21 2102 02/13/21 0551 02/14/21 0032 02/14/21 1355 02/15/21 0101  NA 136 138 140  --  138  K 4.9 4.6 3.9  --   3.6  CL 105 111 113*  --  111  CO2 21* 18* 21*  --  19*  GLUCOSE 197* 228* 133*  --  129*  BUN 45* 57* 28*  --  17  CREATININE 1.36* 1.61* 1.25*  --  1.20  CALCIUM 8.7* 8.0* 7.8*  --  8.2*  MG 2.0  --   --  2.0  --   GFRNONAA 54* 44* >60  --  >60  ANIONGAP 10 9 6   --  8    Lipids No results for input(s): CHOL, TRIG, HDL, LABVLDL, LDLCALC, CHOLHDL in the last 168 hours.  Hematology Recent Labs  Lab 02/14/21 0032 02/14/21 1355 02/15/21 0101  WBC 9.3 9.5 9.1  RBC 2.61* 2.63* 2.92*  HGB 8.0* 7.8* 8.8*  HCT 23.2* 23.9* 26.0*  MCV 88.9 90.9 89.0  MCH 30.7 29.7 30.1  MCHC 34.5 32.6 33.8  RDW 15.7* 15.9* 15.1  PLT 99* 83* 90*   Thyroid  Recent Labs  Lab 02/13/21 1415  TSH 1.247    BNP Recent Labs  Lab 02/12/21 2103  BNP 45.4    DDimer No results for input(s): DDIMER in the last 168 hours.   Radiology    ECHOCARDIOGRAM COMPLETE  Result Date: 02/13/2021  ECHOCARDIOGRAM REPORT   Patient Name:   KENTLEY BOGARDUS Date of Exam: 02/13/2021 Medical Rec #:  [id number]       Height:       70.0 in Accession #:    [id number]      Weight:       190.9 lb Date of Birth:  May 20, 1945       BSA:          2.047 m Patient Age:    75 years        BP:           113/57 mmHg Patient Gender: M               HR:           107 bpm. Exam Location:  Inpatient Procedure: 2D Echo, Cardiac Doppler and Color Doppler Indications:    I48.1 Persistent atrial fibrillation  History:        Patient has no prior history of Echocardiogram examinations.                 Arrythmias:Atrial Fibrillation and Tachycardia.  Sonographer:    Vanetta Shawl Referring Phys: [id number] EMILY B MULLEN IMPRESSIONS  1. Left ventricular ejection fraction, by estimation, is 70 to 75%. The left ventricle has hyperdynamic function. The left ventricle has no regional wall motion abnormalities. There is mild left ventricular hypertrophy. Left ventricular diastolic function could not be evaluated.  2. Right ventricular systolic function is  normal. The right ventricular size is normal.  3. Left atrial size was moderately dilated.  4. The mitral valve is normal in structure. Mild mitral valve regurgitation. No evidence of mitral stenosis.  5. The aortic valve is tricuspid. Aortic valve regurgitation is not visualized. Aortic valve sclerosis is present, with no evidence of aortic valve stenosis.  6. The inferior vena cava is normal in size with greater than 50% respiratory variability, suggesting right atrial pressure of 3 mmHg. FINDINGS  Left Ventricle: Left ventricular ejection fraction, by estimation, is 70 to 75%. The left ventricle has hyperdynamic function. The left ventricle has no regional wall motion abnormalities. The left ventricular internal cavity size was normal in size. There is mild left ventricular hypertrophy. Left ventricular diastolic function could not be evaluated due to atrial fibrillation. Left ventricular diastolic function could not be evaluated. Right Ventricle: The right ventricular size is normal. Right ventricular systolic function is normal. Left Atrium: Left atrial size was moderately dilated. Right Atrium: Right atrial size was normal in size. Pericardium: There is no evidence of pericardial effusion. Mitral Valve: The mitral valve is normal in structure. Mild mitral annular calcification. Mild mitral valve regurgitation. No evidence of mitral valve stenosis. Tricuspid Valve: The tricuspid valve is normal in structure. Tricuspid valve regurgitation is trivial. No evidence of tricuspid stenosis. Aortic Valve: The aortic valve is tricuspid. Aortic valve regurgitation is not visualized. Aortic valve sclerosis is present, with no evidence of aortic valve stenosis. Aortic valve mean gradient measures 4.3 mmHg. Aortic valve peak gradient measures 7.2  mmHg. Aortic valve area, by VTI measures 2.53 cm. Pulmonic Valve: The pulmonic valve was normal in structure. Pulmonic valve regurgitation is not visualized. No evidence of  pulmonic stenosis. Aorta: The aortic root is normal in size and structure. Venous: The inferior vena cava is normal in size with greater than 50% respiratory variability, suggesting right atrial pressure of 3 mmHg. IAS/Shunts: No atrial level shunt detected by color flow Doppler.  LEFT VENTRICLE PLAX 2D LVIDd:  3.80 cm   Diastology LVIDs:         2.10 cm   LV e' medial:    6.71 cm/s LV PW:         1.20 cm   LV E/e' medial:  20.6 LV IVS:        1.20 cm   LV e' lateral:   9.88 cm/s LVOT diam:     2.00 cm   LV E/e' lateral: 14.0 LV SV:         66 LV SV Index:   32 LVOT Area:     3.14 cm  RIGHT VENTRICLE          IVC RV Basal diam:  3.30 cm  IVC diam: 1.90 cm LEFT ATRIUM             Index        RIGHT ATRIUM           Index LA diam:        4.90 cm 2.39 cm/m   RA Area:     17.80 cm LA Vol (A2C):   58.8 ml 28.73 ml/m  RA Volume:   48.60 ml  23.75 ml/m LA Vol (A4C):   74.0 ml 36.16 ml/m LA Biplane Vol: 71.1 ml 34.74 ml/m  AORTIC VALVE AV Area (Vmax):    2.65 cm AV Area (Vmean):   2.57 cm AV Area (VTI):     2.53 cm AV Vmax:           134.00 cm/s AV Vmean:          96.433 cm/s AV VTI:            0.261 m AV Peak Grad:      7.2 mmHg AV Mean Grad:      4.3 mmHg LVOT Vmax:         113.00 cm/s LVOT Vmean:        78.900 cm/s LVOT VTI:          0.210 m LVOT/AV VTI ratio: 0.81  AORTA Ao Root diam: 3.10 cm Ao Asc diam:  2.90 cm MITRAL VALVE MV Area (PHT): 6.17 cm     SHUNTS MV Decel Time: 123 msec     Systemic VTI:  0.21 m MV E velocity: 138.00 cm/s  Systemic Diam: 2.00 cm Kirk Ruths MD Electronically signed by Kirk Ruths MD Signature Date/Time: 02/13/2021/1:40:16 PM    Final     Cardiac Studies   Echo 02/13/21:  1. Left ventricular ejection fraction, by estimation, is 70 to 75%. The  left ventricle has hyperdynamic function. The left ventricle has no  regional wall motion abnormalities. There is mild left ventricular  hypertrophy. Left ventricular diastolic  function could not be evaluated.   2.  Right ventricular systolic function is normal. The right ventricular  size is normal.   3. Left atrial size was moderately dilated.   4. The mitral valve is normal in structure. Mild mitral valve  regurgitation. No evidence of mitral stenosis.   5. The aortic valve is tricuspid. Aortic valve regurgitation is not  visualized. Aortic valve sclerosis is present, with no evidence of aortic  valve stenosis.   6. The inferior vena cava is normal in size with greater than 50%  respiratory variability, suggesting right atrial pressure of 3 mmHg.   Patient Profile     75 y.o. male with PAF, atrial flutter, CVA in 2015, diabetes, CKD 3, hypertension, and hyperlipidemia admitted with A. fib with RVR  Assessment & Plan    #Atrial fibrillation with RVR: He is chronically anticoagulated with Xarelto.  Admitted with GI bleed so Xarelto is now on hold.  He underwent EGD yesterday and has been cleared to resume Xarelto today.  CHA2DS2-VASc 6.He was started on amiodarone for rate control due to hypotension.  Resume home metoprolol 25mg  bid and stop amiodarone.   # GI bleed: Xarelto to resume today as above.  H/h stable today.  If he is stable in the am on anticoagulation, suspect he will be discharged home.   # CKD 3a:  Acute on chronic renal failure resolved today.  Creatinine is 1.2.       For questions or updates, please contact [facility] Please consult [email or web address] for contact info under        Signed, , MD  02/15/2021, 1:10 PM

## 2021-02-15 NOTE — Progress Notes (Signed)
Subjective: Feeling well.  No complaints of bleeding.  Objective: Vital signs in last 24 hours: Temp:  [97.3 F (36.3 C)-98.5 F (36.9 C)] 97.9 F (36.6 C) (11/22 0322) Pulse Rate:  [66-95] 87 (11/22 0322) Resp:  [10-21] 21 (11/22 0322) BP: (95-147)/(52-90) 114/78 (11/22 0322) SpO2:  [95 %-99 %] 95 % (11/22 0322) Last BM Date: 02/12/21  Intake/Output from previous day: 11/21 0701 - 11/22 0700 In: 1097 [P.O.:240; I.V.:542; Blood:315] Out: 1 [Urine:1] Intake/Output this shift: No intake/output data recorded.  General appearance: alert and no distress GI: soft, non-tender; bowel sounds normal; no masses,  no organomegaly  Lab Results: Recent Labs    02/14/21 0032 02/14/21 1355 02/15/21 0101  WBC 9.3 9.5 9.1  HGB 8.0* 7.8* 8.8*  HCT 23.2* 23.9* 26.0*  PLT 99* 83* 90*   BMET Recent Labs    02/13/21 0551 02/14/21 0032 02/15/21 0101  NA 138 140 138  K 4.6 3.9 3.6  CL 111 113* 111  CO2 18* 21* 19*  GLUCOSE 228* 133* 129*  BUN 57* 28* 17  CREATININE 1.61* 1.25* 1.20  CALCIUM 8.0* 7.8* 8.2*   LFT No results for input(s): PROT, ALBUMIN, AST, ALT, ALKPHOS, BILITOT, BILIDIR, IBILI in the last 72 hours. PT/INR Recent Labs    02/12/21 2102  LABPROT 23.1*  INR 2.1*   Hepatitis Panel No results for input(s): HEPBSAG, HCVAB, HEPAIGM, HEPBIGM in the last 72 hours. C-Diff No results for input(s): CDIFFTOX in the last 72 hours. Fecal Lactopherrin No results for input(s): FECLLACTOFRN in the last 72 hours.  Studies/Results: ECHOCARDIOGRAM COMPLETE  Result Date: 02/13/2021    ECHOCARDIOGRAM REPORT   Patient Name:   Jonathon Stevens Date of Exam: 02/13/2021 Medical Rec #:  [id number]       Height:       70.0 in Accession #:    [id number]      Weight:       190.9 lb Date of Birth:  1945-10-13       BSA:          2.047 m Patient Age:    75 years        BP:           113/57 mmHg Patient Gender: M               HR:           107 bpm. Exam Location:  Inpatient Procedure: 2D  Echo, Cardiac Doppler and Color Doppler Indications:    I48.1 Persistent atrial fibrillation  History:        Patient has no prior history of Echocardiogram examinations.                 Arrythmias:Atrial Fibrillation and Tachycardia.  Sonographer:    Glo Herring Referring Phys: [place]  1. Left ventricular ejection fraction, by estimation, is 70 to 75%. The left ventricle has hyperdynamic function. The left ventricle has no regional wall motion abnormalities. There is mild left ventricular hypertrophy. Left ventricular diastolic function could not be evaluated.  2. Right ventricular systolic function is normal. The right ventricular size is normal.  3. Left atrial size was moderately dilated.  4. The mitral valve is normal in structure. Mild mitral valve regurgitation. No evidence of mitral stenosis.  5. The aortic valve is tricuspid. Aortic valve regurgitation is not visualized. Aortic valve sclerosis is present, with no evidence of aortic valve stenosis.  6. The inferior vena cava is normal in size  with greater than 50% respiratory variability, suggesting right atrial pressure of 3 mmHg. FINDINGS  Left Ventricle: Left ventricular ejection fraction, by estimation, is 70 to 75%. The left ventricle has hyperdynamic function. The left ventricle has no regional wall motion abnormalities. The left ventricular internal cavity size was normal in size. There is mild left ventricular hypertrophy. Left ventricular diastolic function could not be evaluated due to atrial fibrillation. Left ventricular diastolic function could not be evaluated. Right Ventricle: The right ventricular size is normal. Right ventricular systolic function is normal. Left Atrium: Left atrial size was moderately dilated. Right Atrium: Right atrial size was normal in size. Pericardium: There is no evidence of pericardial effusion. Mitral Valve: The mitral valve is normal in structure. Mild mitral annular calcification. Mild  mitral valve regurgitation. No evidence of mitral valve stenosis. Tricuspid Valve: The tricuspid valve is normal in structure. Tricuspid valve regurgitation is trivial. No evidence of tricuspid stenosis. Aortic Valve: The aortic valve is tricuspid. Aortic valve regurgitation is not visualized. Aortic valve sclerosis is present, with no evidence of aortic valve stenosis. Aortic valve mean gradient measures 4.3 mmHg. Aortic valve peak gradient measures 7.2  mmHg. Aortic valve area, by VTI measures 2.53 cm. Pulmonic Valve: The pulmonic valve was normal in structure. Pulmonic valve regurgitation is not visualized. No evidence of pulmonic stenosis. Aorta: The aortic root is normal in size and structure. Venous: The inferior vena cava is normal in size with greater than 50% respiratory variability, suggesting right atrial pressure of 3 mmHg. IAS/Shunts: No atrial level shunt detected by color flow Doppler.  LEFT VENTRICLE PLAX 2D LVIDd:         3.80 cm   Diastology LVIDs:         2.10 cm   LV e' medial:    6.71 cm/s LV PW:         1.20 cm   LV E/e' medial:  20.6 LV IVS:        1.20 cm   LV e' lateral:   9.88 cm/s LVOT diam:     2.00 cm   LV E/e' lateral: 14.0 LV SV:         66 LV SV Index:   32 LVOT Area:     3.14 cm  RIGHT VENTRICLE          IVC RV Basal diam:  3.30 cm  IVC diam: 1.90 cm LEFT ATRIUM             Index        RIGHT ATRIUM           Index LA diam:        4.90 cm 2.39 cm/m   RA Area:     17.80 cm LA Vol (A2C):   58.8 ml 28.73 ml/m  RA Volume:   48.60 ml  23.75 ml/m LA Vol (A4C):   74.0 ml 36.16 ml/m LA Biplane Vol: 71.1 ml 34.74 ml/m  AORTIC VALVE AV Area (Vmax):    2.65 cm AV Area (Vmean):   2.57 cm AV Area (VTI):     2.53 cm AV Vmax:           134.00 cm/s AV Vmean:          96.433 cm/s AV VTI:            0.261 m AV Peak Grad:      7.2 mmHg AV Mean Grad:      4.3 mmHg LVOT Vmax:  113.00 cm/s LVOT Vmean:        78.900 cm/s LVOT VTI:          0.210 m LVOT/AV VTI ratio: 0.81  AORTA Ao Root  diam: 3.10 cm Ao Asc diam:  2.90 cm MITRAL VALVE MV Area (PHT): 6.17 cm     SHUNTS MV Decel Time: 123 msec     Systemic VTI:  0.21 m MV E velocity: 138.00 cm/s  Systemic Diam: 2.00 cm Kirk Ruths MD Electronically signed by Kirk Ruths MD Signature Date/Time: 02/13/2021/1:40:16 PM    Final     Medications: Scheduled:  allopurinol  300 mg Oral QPC breakfast   atorvastatin  10 mg Oral Daily   multivitamin with minerals  1 tablet Oral Daily   pantoprazole (PROTONIX) IV  40 mg Intravenous Q12H   Continuous:  amiodarone 30 mg/hr (02/15/21 [id number])    Assessment/Plan: 1) Esophagitis with a focal ulceration and visible vessel s/p hemoclipping. 2) Anemia - improved. 3) Afib with RVR.  Stable.   The patient is well from the GI standpoint.  He can be restarted on Xarelto.  Plan: 1) Change pantoprazole to 40 mg QD, indefinitely. 2) Okay to restart Xarelto.  If bleeding recurs, it can be managed in the hospital.  LOS: 2 days   Ninetta Adelstein D 02/15/2021, 7:15 AM

## 2021-02-15 NOTE — Progress Notes (Signed)
HD#2 SUBJECTIVE:  Patient Summary: [name] is a 75 year old male with a past medical history of paroxysmal atrial fibrillation, prior CVA, hyperlipidemia, gout and COPD, admitted for Afib with RVR and an episode of hematochezia and hematemesis.   Overnight Events: NAEON  Interim History: Patient seen and assessed at bedside this morning. He denies experiencing any chest pain, palpitations, or new SoB. He continues to deny experiencing any bloody vomiting or BM since admission. He reports eating dinner and breakfast since he was given a diet yesterday.   OBJECTIVE:  Vital Signs: Vitals:   02/14/21 2318 02/15/21 0322 02/15/21 0731 02/15/21 1152  BP: (!) 115/59 114/78 98/62 140/72  Pulse: 75 87 84 (!) 111  Resp: 20 (!) 21 15 18   Temp: 98.2 F (36.8 C) 97.9 F (36.6 C) 97.9 F (36.6 C) 97.8 F (36.6 C)  TempSrc: Oral Oral Oral Oral  SpO2: 96% 95% 98% 97%  Weight:      Height:       Supplemental O2: Room Air SpO2: 97 %   Intake/Output Summary (Last 24 hours) at 02/15/2021 1529 Last data filed at 02/15/2021 1254 Gross per 24 hour  Intake 1127.86 ml  Output 1 ml  Net 1126.86 ml    Net IO Since Admission: 6,344.67 mL [02/15/21 1529]  Physical Exam: Physical Exam Vitals reviewed.  Cardiovascular:     Rate and Rhythm: Normal rate. Rhythm irregular.     Heart sounds: No murmur heard.    Comments: Warm extremities Pulmonary:     Effort: Pulmonary effort is normal.     Breath sounds: Normal breath sounds.  Abdominal:     General: Bowel sounds are normal.     Palpations: Abdomen is soft.     Tenderness: There is no abdominal tenderness.  Neurological:     Mental Status: He is alert.    Patient Lines/Drains/Airways Status     Active Line/Drains/Airways     Name Placement date Placement time Site Days   Peripheral IV 02/13/21 20 G Posterior;Right Hand 02/13/21  0644  Hand  1   Peripheral IV 02/13/21 18 G Distal;Left;Posterior Forearm 02/13/21  0649   Forearm  1             ASSESSMENT/PLAN:  Assessment: [name] is a 75 year old male with a past medical history of paroxysmal atrial fibrillation, prior CVA, hyperlipidemia, gout and COPD, admitted for Afib with RVR and an episode of hematochezia and hematemesis.   Plan: #Afib with RVR #Hx paroxysmal afib HR from 75-100 with stable blood pressure. Transitioned from amio to home Lopressor 25 mg BID today. Discussed risk of GI bleeding with Xarelto but benefit of stroke risk reduction. The patient is strongly in favor of resuming this medication.  -Cards following, appreciate recs -D/C amio drip -Lopressor 25 mg BID -Xarelto 20 mg starting today -D/C mIVF  #Acute blood loss anemia #Hematemesis #Hematochezia EGD remarkable for punctate vessel that was clipped. Dr. 61 is confident this was the source of patient's bleeding. Hgb down to 7.8, given 1U pRBCs, follow up Hgb of 8.8. Expect improvement from hereon out.  -GI consult, appreciate recs -Recheck Hgb tomorrow AM -Protonix 40 mg daily indefinitely  Other problems, chronic and stable: -AKI (resolved) on CKD stage IIIa -Leukocytosis (resolved) -History of embolic CVA -Hyperlipidemia -Type 2 DM, well controlled -Infrarenal abdominal aortic aneurysm, stable -Right common iliac artery aneurysm, stable -COPD -Gout -Osteopenia -Chronic fatigue syndrome  Best Practice: Diet: heart healthy IVF: None VTE: SCDs Start: 02/13/21  [id number] Code: FULL DISPO: Anticipated discharge home tomorrow  Signature: Corky Sox, MD PGY-1 Pager: [phone]

## 2021-02-16 ENCOUNTER — Encounter (HOSPITAL_COMMUNITY): Payer: Self-pay | Admitting: Gastroenterology

## 2021-02-16 LAB — BASIC METABOLIC PANEL
Anion gap: 5 (ref 5–15)
BUN: 11 mg/dL (ref 8–23)
CO2: 25 mmol/L (ref 22–32)
Calcium: 8.6 mg/dL — ABNORMAL LOW (ref 8.9–10.3)
Chloride: 109 mmol/L (ref 98–111)
Creatinine, Ser: 1.26 mg/dL — ABNORMAL HIGH (ref 0.61–1.24)
GFR, Estimated: 59 mL/min — ABNORMAL LOW (ref >60)
Glucose, Bld: 130 mg/dL — ABNORMAL HIGH (ref 70–99)
Potassium: 3.9 mmol/L (ref 3.5–5.1)
Sodium: 139 mmol/L (ref 135–145)

## 2021-02-16 LAB — CBC
HCT: 27.1 % — ABNORMAL LOW (ref 39.0–52.0)
Hemoglobin: 9 g/dL — ABNORMAL LOW (ref 13.0–17.0)
MCH: 30.1 pg (ref 26.0–34.0)
MCHC: 33.2 g/dL (ref 30.0–36.0)
MCV: 90.6 fL (ref 80.0–100.0)
Platelets: 99 10*3/uL — ABNORMAL LOW (ref 150–400)
RBC: 2.99 MIL/uL — ABNORMAL LOW (ref 4.22–5.81)
RDW: 14.9 % (ref 11.5–15.5)
WBC: 8 10*3/uL (ref 4.0–10.5)
nRBC: 0 % (ref 0.0–0.2)

## 2021-02-16 MED ORDER — PANTOPRAZOLE SODIUM 40 MG PO TBEC: 40.0000 mg | DELAYED_RELEASE_TABLET | Freq: Every day | ORAL | 1 refills | Status: AC

## 2021-02-16 MED ORDER — POTASSIUM CHLORIDE 20 MEQ PO PACK
20.0000 meq | PACK | Freq: Once | ORAL | Status: AC
  Administered 2021-02-16: 20 meq via ORAL
  Filled 2021-02-16: qty 1

## 2021-02-16 NOTE — Plan of Care (Signed)

## 2021-02-16 NOTE — Progress Notes (Signed)
Progress Note  Patient Name: Jonathon Stevens Date of Encounter: 02/16/2021  [facility] Cardiologist: Lance Muss, MD   Subjective   Feeling fine.  Denies chest pain or palpitations.  Breathing stable. Ambulated without difficulty.  Noted pain, redness and swelling at IV site.   Inpatient Medications    Scheduled Meds:  allopurinol  300 mg Oral QPC breakfast   atorvastatin  10 mg Oral Daily   metoprolol tartrate  25 mg Oral BID   multivitamin with minerals  1 tablet Oral Daily   pantoprazole  40 mg Oral Daily   rivaroxaban  20 mg Oral Daily   Continuous Infusions:   PRN Meds: acetaminophen, ondansetron (ZOFRAN) IV   Vital Signs    Vitals:   02/15/21 1930 02/15/21 2309 02/16/21 0347 02/16/21 0755  BP: 122/67 115/70 107/78 129/77  Pulse: 78 64 74 89  Resp: 20 19 11 19   Temp: 97.9 F (36.6 C) 98 F (36.7 C) 98.5 F (36.9 C) 98.7 F (37.1 C)  TempSrc: Oral Oral Oral Oral  SpO2: 98% 98% 94% 97%  Weight:      Height:        Intake/Output Summary (Last 24 hours) at 02/16/2021 0846 Last data filed at 02/16/2021 0756 Gross per 24 hour  Intake 1157.24 ml  Output --  Net 1157.24 ml   Last 3 Weights 02/13/2021 02/12/2021 01/10/2021  Weight (lbs) 198 lb 1.6 oz 190 lb 14.7 oz 191 lb  Weight (kg) 89.858 kg 86.6 kg 86.637 kg      Telemetry    Atrial fibrillation.  Rate mostly less than 100 bpm.  2.7-second pause overnight.- Personally Reviewed  ECG    02/14/2021: Atrial fibrillation.  Rate 74 bpm. - Personally Reviewed  Physical Exam   GEN: No acute distress.   Neck: No JVD Cardiac: Irregularly irregular, no murmurs, rubs, or gallops.  Respiratory: Clear to auscultation bilaterally. GI: Soft, nontender, non-distended  MS: No edema; No deformity.  R AC erythema and induration. Neuro:  Nonfocal  Psych: Normal affect   Labs    High Sensitivity Troponin:   Recent Labs  Lab 02/13/21 1201 02/13/21 1415  TROPONINIHS 13 17      Chemistry Recent Labs  Lab 02/12/21 2102 02/13/21 0551 02/14/21 0032 02/14/21 1355 02/15/21 0101 02/16/21 0238  NA 136   < > 140  --  138 139  K 4.9   < > 3.9  --  3.6 3.9  CL 105   < > 113*  --  111 109  CO2 21*   < > 21*  --  19* 25  GLUCOSE 197*   < > 133*  --  129* 130*  BUN 45*   < > 28*  --  17 11  CREATININE 1.36*   < > 1.25*  --  1.20 1.26*  CALCIUM 8.7*   < > 7.8*  --  8.2* 8.6*  MG 2.0  --   --  2.0  --   --   GFRNONAA 54*   < > >60  --  >60 59*  ANIONGAP 10   < > 6  --  8 5   < > = values in this interval not displayed.    Lipids No results for input(s): CHOL, TRIG, HDL, LABVLDL, LDLCALC, CHOLHDL in the last 168 hours.  Hematology Recent Labs  Lab 02/14/21 1355 02/15/21 0101 02/16/21 0238  WBC 9.5 9.1 8.0  RBC 2.63* 2.92* 2.99*  HGB 7.8* 8.8* 9.0*  HCT 23.9*  26.0* 27.1*  MCV 90.9 89.0 90.6  MCH 29.7 30.1 30.1  MCHC 32.6 33.8 33.2  RDW 15.9* 15.1 14.9  PLT 83* 90* 99*   Thyroid  Recent Labs  Lab 02/13/21 1415  TSH 1.247    BNP Recent Labs  Lab 02/12/21 2103  BNP 45.4    DDimer No results for input(s): DDIMER in the last 168 hours.   Radiology    No results found.  Cardiac Studies   Echo 02/13/21:  1. Left ventricular ejection fraction, by estimation, is 70 to 75%. The  left ventricle has hyperdynamic function. The left ventricle has no  regional wall motion abnormalities. There is mild left ventricular  hypertrophy. Left ventricular diastolic  function could not be evaluated.   2. Right ventricular systolic function is normal. The right ventricular  size is normal.   3. Left atrial size was moderately dilated.   4. The mitral valve is normal in structure. Mild mitral valve  regurgitation. No evidence of mitral stenosis.   5. The aortic valve is tricuspid. Aortic valve regurgitation is not  visualized. Aortic valve sclerosis is present, with no evidence of aortic  valve stenosis.   6. The inferior vena cava is normal in size with  greater than 50%  respiratory variability, suggesting right atrial pressure of 3 mmHg.   Patient Profile     75 y.o. male with PAF, atrial flutter, CVA in 2015, diabetes, CKD 3, hypertension, and hyperlipidemia admitted with A. fib with RVR  Assessment & Plan    # Atrial fibrillation with RVR: He is chronically anticoagulated with Xarelto.  Admitted with GI bleed. He underwent EGD restarted Xarelto yesterday.  H/h stable.   CHA2DS2-VASc 6.He was started on amiodarone for rate control due to hypotension.  This was stopped and his home metoprolol restarted yesterday.  # GI bleed: Xarelto restarted and H/h stable today.  OK for discharge from cardiac standpoint.  # CKD 3a:  Acute on chronic renal failure resolved today.  Creatinine is 1.2.   [facility] will sign off.   Medication Recommendations:   Other recommendations (labs, testing, etc):  none Follow up as an outpatient:  follow up arranged      For questions or updates, please contact [place] Please consult [email or web address] for contact info under        Signed, Skeet Latch, MD  02/16/2021, 8:46 AM

## 2021-02-16 NOTE — Progress Notes (Signed)
Right ac reddened and puffy post iv site. Warm compress applied X 20  min. MD made aware by RN who is coming to check on him. Puffiness  and redness on the site subsided. Continue to monitor

## 2021-02-16 NOTE — Discharge Summary (Signed)
Name: Jonathon Stevens MRN: [id number] DOB: [birth year] 75 y.o. PCP: Christain Sacramento, MD  Date of Admission: 02/13/2021  5:26 AM Date of Discharge: 02/16/2021  1:32 PM Attending Physician: Dr. Velna Ochs  Discharge Diagnosis: 1. Afib with RVR 2. Acute blood loss anemia 2/2 esophageal vessel 3. CKD 3a 4. Hx Embolic CVA 5. HLD 6. T2DM 7. Infrarenal abdominal aortic aneurysm, stable 7. Right common iliac artery aneurysm, stable 8. COPD 9. Gout 10. Osteopenia 11. Chronic fatigue syndrome   Discharge Medications: Allergies as of 02/16/2021       Reactions   Cyclobenzaprine Nausea Only   Indomethacin Rash        Medication List     TAKE these medications    acetaminophen 650 MG CR tablet Commonly known as: TYLENOL Take 1,300 mg by mouth every 8 (eight) hours as needed for pain.   allopurinol 300 MG tablet Commonly known as: ZYLOPRIM Take 300 mg by mouth daily after breakfast.   atorvastatin 10 MG tablet Commonly known as: LIPITOR TAKE 1 TABLET BY MOUTH DAILY AT 6 PM What changed: when to take this   blood glucose meter kit and supplies Kit Dispense based on patient and insurance preference. Use up to four times daily as directed. (FOR ICD-9 250.00, 250.01).   COENZYME Q10 PO Take 1 tablet by mouth daily.   FISH OIL PO Take 2 tablets by mouth daily.   GLUCOSAMINE-CHONDROITIN PO Take 1 tablet by mouth daily.   metoprolol tartrate 25 MG tablet Commonly known as: LOPRESSOR TAKE 1 TABLET(25 MG) BY MOUTH TWICE DAILY What changed: See the new instructions.   multivitamin tablet Take 1 tablet by mouth daily.   pantoprazole 40 MG tablet Commonly known as: PROTONIX Take 1 tablet (40 mg total) by mouth daily.   Prolia 60 MG/ML Sosy injection Generic drug: denosumab Inject 60 mg into the skin every 6 (six) months.   rivaroxaban 20 MG Tabs tablet Commonly known as: Xarelto TAKE 1 TABLET(20 MG) BY MOUTH DAILY What changed:  how much to take how to  take this when to take this additional instructions        Disposition and follow-up:   Mr.Koree Joshua was discharged from [facility] in Stable condition.  At the hospital follow up visit please address:  1. Routine labs and monitoring  2.  Labs / imaging needed at time of follow-up: NA  3.  Pending labs/ test needing follow-up: NA  Follow-up Appointments:   Hospital Course by problem list: 1. Afib with RVR Patient with previous Hx afib, on metoprolol 25 mg BID. Presented to ED after an episode of hematochezia and hematemesis and resulting palpitations and dyspnea. Patient was unable to tolerate diltiazem drip due to low BP. He was started on amiodarone drip with subsequent improvement in rate control (HR's to 80's-90's, in afib) and stable BP He was then deemed stable for EGD. He had no recurrence of RVR while hospitalized. After, a source was found (see below). A risk-benefit discussion regarding future anticoagulation with home Xarelto occurred. His HASBLED score was a 3 with a risk of 5.8% for major bleed. The patient felt strongly about continuing Xarelto to prevent a stroke.  2. Acute blood loss anemia 2/2 esophageal vessel EGD was remarkable for a punctate esophageal vessel that was clipped. Patient is to remain on a PPI indefinitely per GI.   3. Infiltrated IV Right upper extremity was found to have erythema and swelling as per photo below. Concern for  IV infiltration with amiodarone. Talked with pharmacy, who reports that no intervention was indicated at this time. Patient given strict return precautions, should size increase or erythema increase, or numbness/tingling/coldness of extremity occur.    4. CKD 3a Mild AKI that resolved. Baseline cr of approx 1.3.    5. Hx Embolic CVA Stroke in 3545 with no deficits.   6. HLD No changes made to home regimen of atorvastatin.   7. T2DM Well controlled throughout admission.   8. Infrarenal abdominal  aortic aneurysm, stable 8. Right common iliac artery aneurysm, stable Follows with Dr. Gwenlyn Saran, last seen 10/17 and at that time noninvasive vascular imaging showed stable abdominal aortic aneurysm measuring 3.1 x 3.2 cm.  Right common iliac artery aneurysm had a 2 mm increase in size.  9. COPD History of COPD, no PFTs on file.  Patient was prescribed Stiolto Respimat inhaler for 6 months after COVID however is no longer using this medication.  10. Gout On allopurinol.   11. Osteopenia History of compression fractures.  DEXA scan in 04/2020 showed osteopenia.  Patient started on denosumab and glucosamine-chondroitin.  12. Chronic fatigue syndrome Patient states that he was diagnosed with chronic fatigue syndrome after COVID in January 2021.  States he has been severely fatigued since this time.  He gets much more short of breath when completing his ADLs.  Outpatient work-up has been unyielding.  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  Discharge Exam:   BP 129/77   Pulse 89   Temp 98.7 F (37.1 C) (Oral)   Resp 19   Ht '5\' 10"'  (1.778 m)   Wt 89.9 kg   SpO2 97%   BMI 28.42 kg/m  Discharge exam:  Physical Exam Vitals reviewed.  Cardiovascular:     Rate and Rhythm: Normal rate. Rhythm irregular.     Heart sounds: No murmur heard. Pulmonary:     Effort: Pulmonary effort is normal.     Breath sounds: Normal breath sounds.  Abdominal:     General: Bowel sounds are normal.     Palpations: Abdomen is soft.     Tenderness: There is no abdominal tenderness.  Neurological:     Mental Status: He is alert.      Pertinent Labs, Studies, and Procedures:  EGD as above  Discharge Instructions: You were admitted for a gastrointestinal bleed and a fast heart rate (atrial fibrillation with rapid ventricular rate). These have both resolved. You can resume taking your home Xarelto and start taking daily Protonix. Please  see your PCP within 7 days of discharge.   Regarding your right arm swelling, we think it was due to an IV leaking fluid into your subcutaneous tissue. Some of the heart rate controlling medicine (amiodarone) may have been trapped there. We talked with our pharmacist and cardiologist and they feel you can still be discharged safely today. Please return if you experience and increase in redness/swelling or develop a numbness or coldness in your extremity.    Signed: Corky Sox, MD 02/16/2021, 3:27 PM

## 2021-02-16 NOTE — Care Management Important Message (Signed)
Important Message  Patient Details  Name: Jonathon Stevens MRN: [id number] Date of Birth: [birth year]   Medicare Important Message Given:  Yes     Rondell Frick 02/16/2021, 3:00 PM

## 2021-02-16 NOTE — Progress Notes (Signed)
Discharged home accompanied by son. Belongings taken home 

## 2021-02-17 LAB — TYPE AND SCREEN
ABO/RH(D): A POS
Antibody Screen: NEGATIVE
Unit division: 0
Unit division: 0
Unit division: 0
Unit division: 0
Unit division: 0

## 2021-02-17 LAB — BPAM RBC
Blood Product Expiration Date: 202211232359
Blood Product Expiration Date: 202211232359
Blood Product Expiration Date: 202212182359
Blood Product Expiration Date: 202212202359
Blood Product Expiration Date: 202212212359
ISSUE DATE / TIME: 202211200745
ISSUE DATE / TIME: 202211201059
ISSUE DATE / TIME: 202211211815
Unit Type and Rh: 6200
Unit Type and Rh: 6200
Unit Type and Rh: 6200
Unit Type and Rh: 6200
Unit Type and Rh: 6200

## 2021-03-11 ENCOUNTER — Ambulatory Visit: Payer: Medicare Other | Admitting: Interventional Cardiology

## 2021-04-14 NOTE — Progress Notes (Signed)
Office Visit    Patient Name: Jonathon Stevens Date of Encounter: 04/15/2021  PCP:  Christain Sacramento, MD   [place]  Cardiologist:  Larae Grooms, MD  Advanced Practice Provider:  No care team member to display Electrophysiologist:  None    Chief Complaint    Jonathon Stevens is a 76 y.o. male with a hx of CVA, atrial fibrillation, hyperlipidemia, diabetes mellitus without complication, and hypertension presents today for follow-up.   Past Medical History    Past Medical History:  Diagnosis Date   Atrial flutter ([place])    CVA (cerebral infarction)    embolic CVA 03/930   Diabetes mellitus without complication (HCC)    Gout    HLD (hyperlipidemia)    Hx of echocardiogram    a. ECHO 10/08/13: EF 45-50%, mild hypokinesis of apical myocardium. Mild LA dilation, mild RA dilation;  b.  TEE (10/09/13):  EF 50-55%, normal wall motion, no LAA clot   Hypertension    Stroke [facility])    Past Surgical History:  Procedure Laterality Date   ESOPHAGOGASTRODUODENOSCOPY (EGD) WITH PROPOFOL N/A 02/14/2021   Procedure: ESOPHAGOGASTRODUODENOSCOPY (EGD) WITH PROPOFOL;  Surgeon: Carol Ada, MD;  Location: [place];  Service: Endoscopy;  Laterality: N/A;   HEMOSTASIS CLIP PLACEMENT  02/14/2021   Procedure: HEMOSTASIS CLIP PLACEMENT;  Surgeon: Carol Ada, MD;  Location: [place];  Service: Endoscopy;;   TEE WITHOUT CARDIOVERSION N/A 10/09/2013   Procedure: TRANSESOPHAGEAL ECHOCARDIOGRAM (TEE);  Surgeon: Pixie Casino, MD;  Location: [facility] ENDOSCOPY;  Service: Cardiovascular;  Laterality: N/A;    Allergies  Allergies  Allergen Reactions   Cyclobenzaprine Nausea Only   Indomethacin Rash    History of Present Illness    Jonathon Stevens is a 76 y.o. male with a hx of CVA, atrial fibrillation, hyperlipidemia, diabetes mellitus without complication, and hypertension presents today for follow-up.   He was last seen by Dr. Irish Lack 09/2020 he had been started  on anticoagulation with Xarelto.  Much of his neurologic symptoms from his CVA have resolved.  Of note, there was a question of mildly decreased left ventricular ejection fraction.  On TEE the following day in the hospital showed no LVEF.  He did not have any ischemia evaluation due to lack of symptoms and resolution of LV dysfunction.  This may have been tachycardia mediated.    He was admitted in January 2021 for COVID-pneumonia.  His symptoms improved with steroids, remdesivir, and Actemra as well as antibiotics.  Metoprolol was uptitrated for RVR during his hospitalization.  When he was last seen 09/2020 by Dr. Irish Lack he endorsed rare lightheadedness that lasted for few seconds.  He did take an extra metoprolol at night at times not for any specific symptoms.  He denied chest pain, leg edema, nitroglycerin use, orthopnea, palpitations, PND, and syncope  Today, he feels like he has extreme fatigue.  He states that 6 years ago he had a stroke and bounced back was swimming almost every day and felt good.  Back in January 2021 he had COVID and was hospitalized for 14 days.  He was given antibiotics, steroids, and fluids.  He was discharged and had no energy and was diagnosed with chronic fatigue syndrome.  He then started swimming again here at [employer].  Recently had started throwing up blood and was found to have an esophageal bleed from a hiatal hernia.  He was referred to Dr. Benson Norway who then placed clips in his esophagus.  He has not  had any further bleeding.  His hemoglobin continues to trend up.  He endorses hot flashes and dizziness at times.  He also has noticed his exercise tolerance has gone progressively down.  He even will get short of breath with drying dishes.  He drinks a lot of water and tries to maintain a good diet.  He states that the lightheadedness/dizziness is not associated with positional change or activity and can come on randomly.  We discussed his echocardiogram back from  November which showed a normal LVEF and normal valves.  We discussed further work-up of his heart and decided that he would be appropriate for a Myoview treadmill stress test.  Reports no chest pain, pressure, or tightness. No edema, orthopnea, PND. Reports no palpitations.     EKGs/Labs/Other Studies Reviewed:   The following studies were reviewed today:  Echocardiogram 02/13/2021  IMPRESSIONS     1. Left ventricular ejection fraction, by estimation, is 70 to 75%. The  left ventricle has hyperdynamic function. The left ventricle has no  regional wall motion abnormalities. There is mild left ventricular  hypertrophy. Left ventricular diastolic  function could not be evaluated.   2. Right ventricular systolic function is normal. The right ventricular  size is normal.   3. Left atrial size was moderately dilated.   4. The mitral valve is normal in structure. Mild mitral valve  regurgitation. No evidence of mitral stenosis.   5. The aortic valve is tricuspid. Aortic valve regurgitation is not  visualized. Aortic valve sclerosis is present, with no evidence of aortic  valve stenosis.   6. The inferior vena cava is normal in size with greater than 50%  respiratory variability, suggesting right atrial pressure of 3 mmHg.   EKG:  EKG is  ordered today.  The ekg ordered today demonstrates atrial fibrillation, rate 97.  Recent Labs: 02/12/2021: B Natriuretic Peptide 45.4 02/13/2021: TSH 1.247 02/14/2021: Magnesium 2.0 02/16/2021: BUN 11; Creatinine, Ser 1.26; Hemoglobin 9.0; Platelets 99; Potassium 3.9; Sodium 139  Recent Lipid Panel    Component Value Date/Time   CHOL 175 10/08/2013 0241   TRIG 46 03/30/2019 2146   HDL 41 10/08/2013 0241   CHOLHDL 4.3 10/08/2013 0241   VLDL 23 10/08/2013 0241   LDLCALC 111 (H) 10/08/2013 0241    Home Medications   Current Meds  Medication Sig   acetaminophen (TYLENOL) 650 MG CR tablet Take 1,300 mg by mouth every 8 (eight) hours as needed  for pain.   allopurinol (ZYLOPRIM) 300 MG tablet Take 300 mg by mouth daily after breakfast.    atorvastatin (LIPITOR) 10 MG tablet TAKE 1 TABLET BY MOUTH DAILY AT 6 PM (Patient taking differently: Take 10 mg by mouth daily at 6 PM.)   blood glucose meter kit and supplies KIT Dispense based on patient and insurance preference. Use up to four times daily as directed. (FOR ICD-9 250.00, 250.01).   COENZYME Q10 PO Take 1 tablet by mouth daily.    GLUCOSAMINE-CHONDROITIN PO Take 1 tablet by mouth daily.    metoprolol tartrate (LOPRESSOR) 25 MG tablet TAKE 1 TABLET(25 MG) BY MOUTH TWICE DAILY (Patient taking differently: Take 25 mg by mouth 2 (two) times daily.)   Multiple Vitamin (MULTIVITAMIN) tablet Take 1 tablet by mouth daily.   Omega-3 Fatty Acids (FISH OIL PO) Take 2 tablets by mouth daily.   pantoprazole (PROTONIX) 40 MG tablet Take 1 tablet (40 mg total) by mouth daily.   PROLIA 60 MG/ML SOSY injection Inject 60 mg into the  skin every 6 (six) months.   rivaroxaban (XARELTO) 20 MG TABS tablet TAKE 1 TABLET(20 MG) BY MOUTH DAILY (Patient taking differently: Take 20 mg by mouth daily.)     Review of Systems      All other systems reviewed and are otherwise negative except as noted above.  Physical Exam    VS:  BP 132/78    Pulse 97    Ht '5\' 10"'  (1.778 m)    Wt 197 lb 3.2 oz (89.4 kg)    SpO2 96%    BMI 28.30 kg/m  , BMI Body mass index is 28.3 kg/m.  Wt Readings from Last 3 Encounters:  04/15/21 197 lb 3.2 oz (89.4 kg)  02/13/21 198 lb 1.6 oz (89.9 kg)  02/12/21 190 lb 14.7 oz (86.6 kg)     GEN: Well nourished, well developed, in no acute distress. HEENT: normal. Neck: Supple, no JVD, carotid bruits, or masses. Cardiac: RRR, no murmurs, rubs, or gallops. No clubbing, cyanosis, edema.  Radials/PT 2+ and equal bilaterally.  Respiratory:  Respirations regular and unlabored, clear to auscultation bilaterally. GI: Soft, nontender, nondistended. MS: No deformity or atrophy. Skin: Warm  and dry, no rash. Neuro:  Strength and sensation are intact. Psych: Normal affect.  Assessment & Plan    A. fib/a flutter -Rate controlled today and on chronic anticoagulation -he had an esophageal bleed from a hiatal hernia which was clipped by Dr. Benson Norway -No further bleeding and last hemoglobin was trending up  2. Decreased exercise tolerance/dizziness/lightheadedness -We have ordered a Lexiscan treadmill stress test to further evaluate and r/o coronary disease  3. Hyperlipidemia -No recent Lipid panel -Will order lipid panel and LFTs today  -Continue Lipitor 86m daily  4. Anticoagulated -No recent bleeding  -Hemoglobin is trending in the upward direction  5. Emphysema -well controlled at this time  6. Prediabetic -A1C is 6.3 -Discussed lifestyle changes    Disposition: Follow up 1 month with JLarae Grooms MD or APP.  Signed, TElgie Collard PA-C 04/15/2021, 10:12 AM [place]

## 2021-04-15 ENCOUNTER — Telehealth (HOSPITAL_COMMUNITY): Payer: Self-pay | Admitting: *Deleted

## 2021-04-15 ENCOUNTER — Encounter (HOSPITAL_BASED_OUTPATIENT_CLINIC_OR_DEPARTMENT_OTHER): Payer: Self-pay | Admitting: Physician Assistant

## 2021-04-15 ENCOUNTER — Encounter (HOSPITAL_BASED_OUTPATIENT_CLINIC_OR_DEPARTMENT_OTHER): Payer: Self-pay | Admitting: Interventional Cardiology

## 2021-04-15 ENCOUNTER — Ambulatory Visit (HOSPITAL_BASED_OUTPATIENT_CLINIC_OR_DEPARTMENT_OTHER): Payer: Medicare Other | Admitting: Physician Assistant

## 2021-04-15 ENCOUNTER — Other Ambulatory Visit: Payer: Self-pay

## 2021-04-15 VITALS — BP 132/78 | HR 97 | Ht 70.0 in | Wt 197.2 lb

## 2021-04-15 DIAGNOSIS — I4821 Permanent atrial fibrillation: Secondary | ICD-10-CM

## 2021-04-15 DIAGNOSIS — E782 Mixed hyperlipidemia: Secondary | ICD-10-CM | POA: Diagnosis not present

## 2021-04-15 DIAGNOSIS — D6869 Other thrombophilia: Secondary | ICD-10-CM | POA: Diagnosis not present

## 2021-04-15 DIAGNOSIS — R7303 Prediabetes: Secondary | ICD-10-CM

## 2021-04-15 DIAGNOSIS — I4891 Unspecified atrial fibrillation: Secondary | ICD-10-CM

## 2021-04-15 DIAGNOSIS — R0609 Other forms of dyspnea: Secondary | ICD-10-CM

## 2021-04-15 DIAGNOSIS — Z7901 Long term (current) use of anticoagulants: Secondary | ICD-10-CM

## 2021-04-15 NOTE — Telephone Encounter (Signed)
Patient given detailed instructions per Myocardial Perfusion Study Information Sheet for the test on 04/20/21 at 7:45. Patient notified to arrive 15 minutes early and that it is imperative to arrive on time for appointment to keep from having the test rescheduled.  If you need to cancel or reschedule your appointment, please call the office within 24 hours of your appointment. . Patient verbalized understanding.Jonathon Stevens

## 2021-04-15 NOTE — Patient Instructions (Signed)
Medication Instructions:  Your Physician recommend you continue on your current medication as directed.    *If you need a refill on your cardiac medications before your next appointment, please call your pharmacy*   Lab Work: Your physician recommends that you return for lab work Within the next week for Fasting Lipid Panel and Liver Function Tests1   You may come to the...   [facility] (3rd floor) [address] [place], [place] [address]  Open: 8am-Noon and 1pm-4:30pm   [facility] at [facility] [address]- Any location  **no appointments needed**  If you have labs (blood work) drawn today and your tests are completely normal, you will receive your results only by: [employer] (if you have MyChart) OR A paper copy in the mail If you have any lab test that is abnormal or we need to change your treatment, we will call you to review the results.   Testing/Procedures: You are scheduled for a Myocardial Perfusion Imaging Study  Please arrive 15 minutes prior to your appointment time for registration and insurance purposes.  The test will take approximately 3 to 4 hours to complete; you may bring reading material.  If someone comes with you to your appointment, they will need to remain in the main lobby due to limited space in the testing area. **If you are pregnant or breastfeeding, please notify the nuclear lab prior to your appointment**  How to prepare for your Myocardial Perfusion Test: Do not eat or drink 3 hours prior to your test, except you may have water. Do not consume products containing caffeine (regular or decaffeinated) 12 hours prior to your test. (ex: coffee, chocolate, sodas, tea). Do bring a list of your current medications with you.  If not listed below, you may take your medications as normal. Do wear comfortable clothes (no dresses or overalls) and walking shoes, tennis shoes preferred (No heels or  open toe shoes are allowed). Do NOT wear cologne, perfume, aftershave, or lotions (deodorant is allowed). If these instructions are not followed, your test will have to be rescheduled.  Please report to [address], [address] for your test.  If you have questions or concerns about your appointment, you can call the Nuclear Lab at [phone].  If you cannot keep your appointment, please provide 24 hours notification to the Nuclear Lab, to avoid a possible $50 charge to your account.    Follow-Up: At [facility], you and your health needs are our priority.  As part of our continuing mission to provide you with exceptional heart care, we have created designated Provider Care Teams.  These Care Teams include your primary Cardiologist (physician) and Advanced Practice Providers (APPs -  Physician Assistants and Nurse Practitioners) who all work together to provide you with the care you need, when you need it.  We recommend signing up for the patient portal called "MyChart".  Sign up information is provided on this After Visit Summary.  MyChart is used to connect with patients for Virtual Visits (Telemedicine).  Patients are able to view lab/test results, encounter notes, upcoming appointments, etc.  Non-urgent messages can be sent to your provider as well.   To learn more about what you can do with MyChart, go to [email or web address].    Your next appointment:   1 month(s)  The format for your next appointment:   In Person  Provider:   Lance Muss, MD  or APP  Other Instructions None today

## 2021-04-18 ENCOUNTER — Telehealth (HOSPITAL_BASED_OUTPATIENT_CLINIC_OR_DEPARTMENT_OTHER): Payer: Self-pay

## 2021-04-18 ENCOUNTER — Other Ambulatory Visit: Payer: Self-pay | Admitting: Physician Assistant

## 2021-04-18 DIAGNOSIS — R0609 Other forms of dyspnea: Secondary | ICD-10-CM

## 2021-04-18 NOTE — Telephone Encounter (Signed)
Informed consent pended for provider

## 2021-04-19 ENCOUNTER — Telehealth (HOSPITAL_BASED_OUTPATIENT_CLINIC_OR_DEPARTMENT_OTHER): Payer: Self-pay

## 2021-04-19 LAB — LIPID PANEL
Chol/HDL Ratio: 2.9 ratio (ref 0.0–5.0)
Cholesterol, Total: 135 mg/dL (ref 100–199)
HDL: 47 mg/dL (ref >39)
LDL Chol Calc (NIH): 66 mg/dL (ref 0–99)
Triglycerides: 121 mg/dL (ref 0–149)
VLDL Cholesterol Cal: 22 mg/dL (ref 5–40)

## 2021-04-19 LAB — HEPATIC FUNCTION PANEL
ALT: 23 IU/L (ref 0–44)
AST: 25 IU/L (ref 0–40)
Albumin: 4.8 g/dL — ABNORMAL HIGH (ref 3.7–4.7)
Alkaline Phosphatase: 95 IU/L (ref 44–121)
Bilirubin Total: 0.5 mg/dL (ref 0.0–1.2)
Bilirubin, Direct: 0.15 mg/dL (ref 0.00–0.40)
Total Protein: 6.9 g/dL (ref 6.0–8.5)

## 2021-04-19 NOTE — Telephone Encounter (Addendum)
Called results to patient. Patient verbalizes understanding!       ----- Message from Sharlene Dory, PA-C sent at 04/19/2021  8:06 AM EST ----- Please let the patient know:  Jonathon Stevens,   Your liver functions are overall normal. Your albumin is slightly elevated suggesting some dehydration. Please make sure you are getting at least 64 oz of water a day. If you have other questions please let me know!  Thanks!  Sharlene Dory, PA-C

## 2021-04-20 ENCOUNTER — Other Ambulatory Visit: Payer: Self-pay

## 2021-04-20 ENCOUNTER — Ambulatory Visit (HOSPITAL_COMMUNITY): Payer: Medicare Other | Attending: Cardiology

## 2021-04-20 DIAGNOSIS — R0609 Other forms of dyspnea: Secondary | ICD-10-CM

## 2021-04-20 LAB — MYOCARDIAL PERFUSION IMAGING
LV dias vol: 92 mL (ref 62–150)
LV sys vol: 27 mL
Nuc Stress EF: 71 %
Peak HR: 58 {beats}/min
Rest HR: 51 {beats}/min
Rest Nuclear Isotope Dose: 10.1 mCi
SDS: 7
SRS: 0
SSS: 7
ST Depression (mm): 0 mm
Stress Nuclear Isotope Dose: 29.7 mCi
TID: 1

## 2021-04-20 MED ORDER — TECHNETIUM TC 99M TETROFOSMIN IV KIT
29.7000 | PACK | Freq: Once | INTRAVENOUS | Status: AC | PRN
  Administered 2021-04-20: 29.7 via INTRAVENOUS
  Filled 2021-04-20: qty 30

## 2021-04-20 MED ORDER — REGADENOSON 0.4 MG/5ML IV SOLN
0.4000 mg | Freq: Once | INTRAVENOUS | Status: AC
  Administered 2021-04-20: 0.4 mg via INTRAVENOUS

## 2021-04-20 MED ORDER — TECHNETIUM TC 99M TETROFOSMIN IV KIT
10.1000 | PACK | Freq: Once | INTRAVENOUS | Status: AC | PRN
  Administered 2021-04-20: 10.1 via INTRAVENOUS
  Filled 2021-04-20: qty 11

## 2021-05-12 NOTE — Patient Instructions (Addendum)
Medication Instructions:  Your physician has recommended you make the following change in your medication:   STOP: Metoprolol Tartrate START: Metoprolol Succinate 25mg  daily  *If you need a refill on your cardiac medications before your next appointment, please call your pharmacy*   Lab Work: TODAY: CMET, CBC. [place]  Your provider has recommended lab work. Please have this collected at [facility] [place]. The lab is open 8:00 am - 4:30 pm. Please avoid 12:00p - 1:00p for lunch hour. You do not need an appointment. Please go to [address] [place], [place] [address]. This is in the Primary Care office on the 3rd floor, let them know you are there for blood work and they will direct you to the lab.  If you have labs (blood work) drawn today and your tests are completely normal, you will receive your results only by: [place] (if you have MyChart) OR A paper copy in the mail If you have any lab test that is abnormal or we need to change your treatment, we will call you to review the results.   Follow-Up: At [facility], you and your health needs are our priority.  As part of our continuing mission to provide you with exceptional heart care, we have created designated Provider Care Teams.  These Care Teams include your primary Cardiologist (physician) and Advanced Practice Providers (APPs -  Physician Assistants and Nurse Practitioners) who all work together to provide you with the care you need, when you need it.  We recommend signing up for the patient portal called "MyChart".  Sign up information is provided on this After Visit Summary.  MyChart is used to connect with patients for Virtual Visits (Telemedicine).  Patients are able to view lab/test results, encounter notes, upcoming appointments, etc.  Non-urgent messages can be sent to your provider as well.   To learn more about what you can do with MyChart, go to [email or web address].    Your  next appointment:   3 month(s)  The format for your next appointment:   In Person  Provider:   Larae Grooms, MD  or APP   Other Instructions Make sure you are getting 30 minutes of exercise atleast 5 days a week.   Two Gram Sodium Diet 2000 mg  What is Sodium? Sodium is a mineral found naturally in many foods. The most significant source of sodium in the diet is table salt, which is about 40% sodium.  Processed, convenience, and preserved foods also contain a large amount of sodium.  The body needs only 500 mg of sodium daily to function,  A normal diet provides more than enough sodium even if you do not use salt.  Why Limit Sodium? A build up of sodium in the body can cause thirst, increased blood pressure, shortness of breath, and water retention.  Decreasing sodium in the diet can reduce edema and risk of heart attack or stroke associated with high blood pressure.  Keep in mind that there are many other factors involved in these health problems.  Heredity, obesity, lack of exercise, cigarette smoking, stress and what you eat all play a role.  General Guidelines: Do not add salt at the table or in cooking.  One teaspoon of salt contains over 2 grams of sodium. Read food labels Avoid processed and convenience foods Ask your dietitian before eating any foods not dicussed in the menu planning guidelines Consult your physician if you wish to use a salt substitute or a sodium  containing medication such as antacids.  Limit milk and milk products to 16 oz (2 cups) per day.  Shopping Hints: READ LABELS!! "Dietetic" does not necessarily mean low sodium. Salt and other sodium ingredients are often added to foods during processing.    Menu Planning Guidelines Food Group Choose More Often Avoid  Beverages (see also the milk group All fruit juices, low-sodium, salt-free vegetables juices, low-sodium carbonated beverages Regular vegetable or tomato juices, commercially softened water used  for drinking or cooking  Breads and Cereals Enriched white, wheat, rye and pumpernickel bread, hard rolls and dinner rolls; muffins, cornbread and waffles; most dry cereals, cooked cereal without added salt; unsalted crackers and breadsticks; low sodium or homemade bread crumbs Bread, rolls and crackers with salted tops; quick breads; instant hot cereals; pancakes; commercial bread stuffing; self-rising flower and biscuit mixes; regular bread crumbs or cracker crumbs  Desserts and Sweets Desserts and sweets mad with mild should be within allowance Instant pudding mixes and cake mixes  Fats Butter or margarine; vegetable oils; unsalted salad dressings, regular salad dressings limited to 1 Tbs; light, sour and heavy cream Regular salad dressings containing bacon fat, bacon bits, and salt pork; snack dips made with instant soup mixes or processed cheese; salted nuts  Fruits Most fresh, frozen and canned fruits Fruits processed with salt or sodium-containing ingredient (some dried fruits are processed with sodium sulfites        Vegetables Fresh, frozen vegetables and low- sodium canned vegetables Regular canned vegetables, sauerkraut, pickled vegetables, and others prepared in brine; frozen vegetables in sauces; vegetables seasoned with ham, bacon or salt pork  Condiments, Sauces, Miscellaneous  Salt substitute with physician's approval; pepper, herbs, spices; vinegar, lemon or lime juice; hot pepper sauce; garlic powder, onion powder, low sodium soy sauce (1 Tbs.); low sodium condiments (ketchup, chili sauce, mustard) in limited amounts (1 tsp.) fresh ground horseradish; unsalted tortilla chips, pretzels, potato chips, popcorn, salsa (1/4 cup) Any seasoning made with salt including garlic salt, celery salt, onion salt, and seasoned salt; sea salt, rock salt, kosher salt; meat tenderizers; monosodium glutamate; mustard, regular soy sauce, barbecue, sauce, chili sauce, teriyaki sauce, steak sauce,  Worcestershire sauce, and most flavored vinegars; canned gravy and mixes; regular condiments; salted snack foods, olives, picles, relish, horseradish sauce, catsup   Food preparation: Try these seasonings Meats:    Pork Sage, onion Serve with applesauce  Chicken Poultry seasoning, thyme, parsley Serve with cranberry sauce  Lamb Curry powder, rosemary, garlic, thyme Serve with mint sauce or jelly  Veal Marjoram, basil Serve with current jelly, cranberry sauce  Beef Pepper, bay leaf Serve with dry mustard, unsalted chive butter  Fish [place] leaf, dill Serve with unsalted lemon butter, unsalted parsley butter  Vegetables:    Asparagus Lemon juice   Broccoli Lemon juice   Carrots Mustard dressing parsley, mint, nutmeg, glazed with unsalted butter and sugar   Green beans Marjoram, lemon juice, nutmeg,dill seed   Tomatoes Basil, marjoram, onion   Spice /blend for [employer]" 4 tsp ground thyme 1 tsp ground sage 3 tsp ground rosemary 4 tsp ground marjoram   Test your knowledge A product that says "Salt Free" may still contain sodium. True or False Garlic Powder and Hot Pepper Sauce an be used as alternative seasonings.True or False Processed foods have more sodium than fresh foods.  True or False Canned Vegetables have less sodium than froze True or False   WAYS TO DECREASE YOUR SODIUM INTAKE Avoid the use of added salt  in cooking and at the table.  Table salt (and other prepared seasonings which contain salt) is probably one of the greatest sources of sodium in the diet.  Unsalted foods can gain flavor from the sweet, sour, and butter taste sensations of herbs and spices.  Instead of using salt for seasoning, try the following seasonings with the foods listed.  Remember: how you use them to enhance natural food flavors is limited only by your creativity... Allspice-Meat, fish, eggs, fruit, peas, red and yellow vegetables Almond Extract-Fruit baked goods Anise Seed-Sweet breads, fruit,  carrots, beets, cottage cheese, cookies (tastes like licorice) Basil-Meat, fish, eggs, vegetables, rice, vegetables salads, soups, sauces Bay Leaf-Meat, fish, stews, poultry Burnet-Salad, vegetables (cucumber-like flavor) Caraway Seed-Bread, cookies, cottage cheese, meat, vegetables, cheese, rice Cardamon-Baked goods, fruit, soups Celery Powder or seed-Salads, salad dressings, sauces, meatloaf, soup, bread.Do not use  celery salt Chervil-Meats, salads, fish, eggs, vegetables, cottage cheese (parsley-like flavor) Chili Power-Meatloaf, chicken cheese, corn, eggplant, egg dishes Chives-Salads cottage cheese, egg dishes, soups, vegetables, sauces Cilantro-Salsa, casseroles Cinnamon-Baked goods, fruit, pork, lamb, chicken, carrots Cloves-Fruit, baked goods, fish, pot roast, green beans, beets, carrots Coriander-Pastry, cookies, meat, salads, cheese (lemon-orange flavor) Cumin-Meatloaf, fish,cheese, eggs, cabbage,fruit pie (caraway flavor) [employer], fruit, eggs, fish, poultry, cottage cheese, vegetables Dill Seed-Meat, cottage cheese, poultry, vegetables, fish, salads, bread Fennel Seed-Bread, cookies, apples, pork, eggs, fish, beets, cabbage, cheese, Licorice-like flavor Garlic-(buds or powder) Salads, meat, poultry, fish, bread, butter, vegetables, potatoes.Do not  use garlic salt Ginger-Fruit, vegetables, baked goods, meat, fish, poultry Horseradish Root-Meet, vegetables, butter Lemon Juice or Extract-Vegetables, fruit, tea, baked goods, fish salads Mace-Baked goods fruit, vegetables, fish, poultry (taste like nutmeg) Maple Extract-Syrups Marjoram-Meat, chicken, fish, vegetables, breads, green salads (taste like Sage) Mint-Tea, lamb, sherbet, vegetables, desserts, carrots, cabbage Mustard, Dry or Seed-Cheese, eggs, meats, vegetables, poultry Nutmeg-Baked goods, fruit, chicken, eggs, vegetables, desserts Onion Powder-Meat, fish, poultry, vegetables, cheese, eggs, bread, rice salads  (Do not use   Onion salt) Orange Extract-Desserts, baked goods Oregano-Pasta, eggs, cheese, onions, pork, lamb, fish, chicken, vegetables, green salads Paprika-Meat, fish, poultry, eggs, cheese, vegetables Parsley Flakes-Butter, vegetables, meat fish, poultry, eggs, bread, salads (certain forms may   Contain sodium Pepper-Meat fish, poultry, vegetables, eggs Peppermint Extract-Desserts, baked goods Poppy Seed-Eggs, bread, cheese, fruit dressings, baked goods, noodles, vegetables, cottage  [employer], poultry, meat, fish, cauliflower, turnips,eggs bread Saffron-Rice, bread, veal, chicken, fish, eggs Sage-Meat, fish, poultry, onions, eggplant, tomateos, pork, stews Savory-Eggs, salads, poultry, meat, rice, vegetables, soups, pork Tarragon-Meat, poultry, fish, eggs, butter, vegetables (licorice-like flavor)  Thyme-Meat, poultry, fish, eggs, vegetables, (clover-like flavor), sauces, soups Tumeric-Salads, butter, eggs, fish, rice, vegetables (saffron-like flavor) Vanilla Extract-Baked goods, candy Vinegar-Salads, vegetables, meat marinades Walnut Extract-baked goods, candy   2. Choose your Foods Wisely   The following is a list of foods to avoid which are high in sodium:  Meats-Avoid all smoked, canned, salt cured, dried and kosher meat and fish as well as Anchovies   [employer] meats:Bologna, Liverwurst, Pastrami Canned meat or fish  Marinated herring Caviar    Pepperoni Corned Beef   Pizza Dried chipped beef  Salami Frozen breaded fish or meat Salt pork Frankfurters or hot dogs  Sardines Gefilte fish   Sausage Ham (boiled ham, Proscuitto Smoked butt    spiced ham)   Spam      TV Dinners Vegetables Canned vegetables (Regular) Relish Canned mushrooms  Sauerkraut Olives    Tomato juice Pickles  Bakery and Dessert Products Canned puddings  Cream pies Cheesecake   Decorated cakes Cookies  Beverages/Juices Tomato juice,  regular  Gatorade   V-8 vegetable juice, regular  Breads and Cereals Biscuit mixes   Salted potato chips, corn chips, pretzels Bread stuffing mixes  Salted crackers and rolls Pancake and waffle mixes Self-rising flour  Seasonings Accent    Meat sauces Barbecue sauce  Meat tenderizer Catsup    Monosodium glutamate (MSG) Celery salt   Onion salt Chili sauce   Prepared mustard Garlic salt   Salt, seasoned salt, sea salt Gravy mixes   Soy sauce Horseradish   Steak sauce Ketchup   Tartar sauce Lite salt    Teriyaki sauce Marinade mixes   Worcestershire sauce  Others Baking powder   Cocoa and cocoa mixes Baking soda   Commercial casserole mixes Candy-caramels, chocolate  Dehydrated soups    Bars, fudge,nougats  Instant rice and pasta mixes Canned broth or soup  Maraschino cherries Cheese, aged and processed cheese and cheese spreads  Learning Assessment Quiz  Indicated T (for True) or F (for False) for each of the following statements:  _____ Fresh fruits and vegetables and unprocessed grains are generally low in sodium _____ Water may contain a considerable amount of sodium, depending on the source _____ You can always tell if a food is high in sodium by tasting it _____ Certain laxatives my be high in sodium and should be avoided unless prescribed   by a physician or pharmacist _____ Salt substitutes may be used freely by anyone on a sodium restricted diet _____ Sodium is present in table salt, food additives and as a natural component of   most foods _____ Table salt is approximately 90% sodium _____ Limiting sodium intake may help prevent excess fluid accumulation in the body _____ On a sodium-restricted diet, seasonings such as bouillon soy sauce, and    cooking wine should be used in place of table salt _____ On an ingredient list, a product which lists monosodium glutamate as the first   ingredient is an appropriate food to include on a low sodium diet  Circle the best  answer(s) to the following statements (Hint: there may be more than one correct answer)  11. On a low-sodium diet, some acceptable snack items are:    A. Olives  F. Bean dip   K. Grapefruit juice    B. Salted Pretzels G. Commercial Popcorn   L. Canned peaches    C. Carrot Sticks  H. Bouillon   M. Unsalted nuts   D. [place] fries  I. Peanut butter crackers N. Salami   E. Sweet pickles J. Tomato Juice   O. Pizza  12.  Seasonings that may be used freely on a reduced - sodium diet include   A. Lemon wedges F.Monosodium glutamate K. Celery seed    B.Soysauce   G. Pepper   L. Mustard powder   C. Sea salt  H. Cooking wine  M. Onion flakes   D. Vinegar  E. Prepared horseradish N. Salsa   E. Sage   J. Worcestershire sauce  O. Chutney

## 2021-05-15 NOTE — Progress Notes (Addendum)
Office Visit    Patient Name: Jonathon Stevens Date of Encounter: 04/15/2021  PCP:  Christain Sacramento, MD   [place]  Cardiologist:  Larae Grooms, MD  Advanced Practice Provider:  No care team member to display Electrophysiologist:  None    Chief Complaint    Jonathon Stevens is a 76 y.o. male with a hx of CVA, atrial fibrillation, hyperlipidemia, diabetes mellitus without complication, and hypertension presents today for follow-up.   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 ECHO 10/08/13: EF 45-50%, mild hypokinesis of apical myocardium. Mild LA dilation, mild RA dilation;  b.  TEE (10/09/13):  EF 50-55%, normal wall motion, no LAA clot   Hypertension    Stroke [facility])    Past Surgical History:  Procedure Laterality Date   ESOPHAGOGASTRODUODENOSCOPY (EGD) WITH PROPOFOL N/A 02/14/2021   Procedure: ESOPHAGOGASTRODUODENOSCOPY (EGD) WITH PROPOFOL;  Surgeon: Carol Ada, MD;  Location: [place];  Service: Endoscopy;  Laterality: N/A;   HEMOSTASIS CLIP PLACEMENT  02/14/2021   Procedure: HEMOSTASIS CLIP PLACEMENT;  Surgeon: Carol Ada, MD;  Location: [place];  Service: Endoscopy;;   TEE WITHOUT CARDIOVERSION N/A 10/09/2013   Procedure: TRANSESOPHAGEAL ECHOCARDIOGRAM (TEE);  Surgeon: Pixie Casino, MD;  Location: [facility] ENDOSCOPY;  Service: Cardiovascular;  Laterality: N/A;    Allergies  Allergies  Allergen Reactions   Cyclobenzaprine Nausea Only   Indomethacin Rash    History of Present Illness    Jonathon Stevens is a 76 y.o. male with a hx of CVA, atrial fibrillation, hyperlipidemia, diabetes mellitus without complication, and hypertension presents today for follow-up.   He was seen by Dr. Irish Lack 09/2020 he had been started on  anticoagulation with Xarelto.  Much of his neurologic symptoms from his CVA have resolved.  Of note, there was a question of mildly decreased left ventricular ejection fraction.  On TEE the following day in the hospital showed no LVEF.  He did not have any ischemia evaluation due to lack of symptoms and resolution of LV dysfunction.  This may have been tachycardia mediated.  Jonathon Stevens endorsed rare lightheadedness that lasted for few seconds.  He did take an extra metoprolol at night at times not for any specific symptoms.  He denied chest pain, leg edema, nitroglycerin use, orthopnea, palpitations, PND, and syncope  He was admitted in January 2021 for COVID-pneumonia.  His symptoms improved with steroids, remdesivir, and Actemra as well as antibiotics.  Metoprolol was uptitrated for RVR during his hospitalization.  When he was last seen in the clinic by me he was having extreme fatigue.  He stated that 6 years ago he had a stroke and bounced back was swimming almost every day and felt good.  Back in January 2021 he had COVID and was hospitalized for 14 days.  He was given antibiotics, steroids, and fluids.  He was discharged and had no energy and was diagnosed with chronic fatigue syndrome.  He then started swimming again here at [employer].  Recently had started throwing up blood and was found to have an esophageal bleed from a hiatal hernia.  He was referred to Dr. Benson Norway who then placed clips in his esophagus.  He has not had any further bleeding.  His hemoglobin continues to trend up.  He endorses hot flashes and dizziness at times.  He also has noticed his exercise tolerance has gone progressively down.  He even will get short of breath with drying dishes.  He drinks a lot of water and tries to maintain a good diet.  He states that the lightheadedness/dizziness is not associated with positional change or activity and can come on randomly.  We discussed his echocardiogram back from November which showed  a normal LVEF and normal valves.  We discussed further work-up of his heart and decided that he would be appropriate for a Myoview treadmill stress test which was normal and the results were shared with the patient  Today, he states he felt fine before COVID but since then he has had overwhelming fatigue. He is worried about getting back into activity since he is having some SOB. He used to like swimming and pickle ball with his wife but he has been scared to get back into these activities.  He has not been started on any new medications.  He is taking metoprolol tartrate 25 mg twice daily.  Some days he only takes 1 dose since he states it makes him feel more fatigued.  We discussed changing to metoprolol succinate 25 mg daily.  He remains anticoagulated for his atrial fibrillation and has not had any bleeding.  We discussed getting some labs today.  We discussed his echocardiogram and stress test.  I encouraged him to get back into a exercise routine since I think that this will make him feel better.  I also plan to send my note to his primary for further work-up of fatigue.  Reports no chest pain, pressure, or tightness. No edema, orthopnea, PND. Reports no palpitations.      EKGs/Labs/Other Studies Reviewed:   The following studies were reviewed today:  Lexiscan Myoview 04/20/2021    The study is normal. The study is low risk.   No ST deviation was noted.   LV perfusion is normal. There is no evidence of ischemia. There is no evidence of infarction.   Left ventricular function is normal. Nuclear stress EF: 71 %. The left ventricular ejection fraction is hyperdynamic (>65%). End diastolic cavity size is normal.   Prior study not available for comparison.    Echocardiogram 02/13/2021  IMPRESSIONS     1. Left ventricular ejection fraction, by estimation, is 70 to 75%. The  left ventricle has hyperdynamic function. The left ventricle has no  regional wall motion abnormalities. There is  mild left ventricular  hypertrophy. Left ventricular diastolic  function could not be evaluated.   2. Right ventricular systolic function is normal. The right ventricular  size is normal.   3. Left atrial size was moderately dilated.   4. The mitral valve is normal in structure. Mild mitral valve  regurgitation. No evidence of mitral stenosis.   5. The aortic valve is tricuspid. Aortic valve regurgitation is not  visualized. Aortic valve sclerosis is present, with no evidence of aortic  valve stenosis.   6. The inferior vena cava is normal in size with greater than 50%  respiratory variability, suggesting right atrial pressure of 3 mmHg.   EKG:  EKG is  ordered today.  The ekg ordered today demonstrates atrial fibrillation, rate 97.  Recent Labs: 02/12/2021: B Natriuretic Peptide 45.4 02/13/2021: TSH 1.247 02/14/2021: Magnesium 2.0 02/16/2021: BUN 11; Creatinine, Ser 1.26; Hemoglobin 9.0; Platelets 99; Potassium 3.9; Sodium 139  Recent Lipid Panel  Component Value Date/Time   CHOL 175 10/08/2013 0241   TRIG 46 03/30/2019 2146   HDL 41 10/08/2013 0241   CHOLHDL 4.3 10/08/2013 0241   VLDL 23 10/08/2013 0241   LDLCALC 111 (H) 10/08/2013 0241    Home Medications   Current Meds  Medication Sig   acetaminophen (TYLENOL) 650 MG CR tablet Take 1,300 mg by mouth every 8 (eight) hours as needed for pain.   allopurinol (ZYLOPRIM) 300 MG tablet Take 300 mg by mouth daily after breakfast.    atorvastatin (LIPITOR) 10 MG tablet TAKE 1 TABLET BY MOUTH DAILY AT 6 PM (Patient taking differently: Take 10 mg by mouth daily at 6 PM.)   blood glucose meter kit and supplies KIT Dispense based on patient and insurance preference. Use up to four times daily as directed. (FOR ICD-9 250.00, 250.01).   COENZYME Q10 PO Take 1 tablet by mouth daily.    GLUCOSAMINE-CHONDROITIN PO Take 1 tablet by mouth daily.    metoprolol tartrate (LOPRESSOR) 25 MG tablet TAKE 1 TABLET(25 MG) BY MOUTH TWICE DAILY  (Patient taking differently: Take 25 mg by mouth 2 (two) times daily.)   Multiple Vitamin (MULTIVITAMIN) tablet Take 1 tablet by mouth daily.   Omega-3 Fatty Acids (FISH OIL PO) Take 2 tablets by mouth daily.   pantoprazole (PROTONIX) 40 MG tablet Take 1 tablet (40 mg total) by mouth daily.   PROLIA 60 MG/ML SOSY injection Inject 60 mg into the skin every 6 (six) months.   rivaroxaban (XARELTO) 20 MG TABS tablet TAKE 1 TABLET(20 MG) BY MOUTH DAILY (Patient taking differently: Take 20 mg by mouth daily.)     Review of Systems      All other systems reviewed and are otherwise negative except as noted above.  Physical Exam    Vitals:   05/16/21 0815  BP: (!) 141/106  Pulse: 80  SpO2: 96%     Wt Readings from Last 3 Encounters:  04/15/21 197 lb 3.2 oz (89.4 kg)  02/13/21 198 lb 1.6 oz (89.9 kg)  02/12/21 190 lb 14.7 oz (86.6 kg)     GEN: Well nourished, well developed, in no acute distress. HEENT: normal. Neck: Supple, no JVD, carotid bruits, or masses. Cardiac: irregularly irregular, no murmurs, rubs, or gallops. No clubbing, cyanosis, edema.  Radials/PT 2+ and equal bilaterally.  Respiratory:  Respirations regular and unlabored, clear to auscultation bilaterally. GI: Soft, nontender, nondistended. MS: No deformity or atrophy. Skin: Warm and dry, no rash. Neuro:  Strength and sensation are intact. Psych: Normal affect.   Assessment & Plan    Extreme fatigue/decreased exercise tolerance -PCP diagnosed him with chronic fatigue syndrome -Echocardiogram (Nov 2022) and stress test (1/23) both normal -Will order CMP and magnesium level -He is taking a lot of supplements including D3, magnesium, glucosamine, a multivitamin, vitamin C, fish oil, and CoQ10  A. fib/a flutter -diagnosed 5 or 6 years ago, likely not the cause of his fatigue -Rate controlled today and on chronic anticoagulation -he had an esophageal bleed from a hiatal hernia which was clipped by Dr. Benson Norway -No  further bleeding and last hemoglobin was trending up -Will order a CBC to check hemoglobin -Changed metoprolol tartrate to metoprolol succinate  3. Hyperlipidemia -LDL elevated at 66 on recent lipid panel -Continue current dose of Crestor $RemoveBe'10mg'LliBMAixa$    4. Anticoagulated -No recent bleeding  -Hemoglobin is trending in the upward direction -Will order f/u CBC  5. Emphysema -well controlled at this time  6. Prediabetic -A1C is  6.3 -Discussed lifestyle changes    Disposition: Follow up 3 month with Larae Grooms, MD or APP.  Signed, Elgie Collard, PA-C 05/16/2021 [facility]

## 2021-05-16 ENCOUNTER — Other Ambulatory Visit: Payer: Self-pay

## 2021-05-16 ENCOUNTER — Encounter (HOSPITAL_BASED_OUTPATIENT_CLINIC_OR_DEPARTMENT_OTHER): Payer: Self-pay | Admitting: Physician Assistant

## 2021-05-16 ENCOUNTER — Ambulatory Visit (HOSPITAL_BASED_OUTPATIENT_CLINIC_OR_DEPARTMENT_OTHER): Payer: Medicare Other | Admitting: Physician Assistant

## 2021-05-16 VITALS — BP 141/106 | HR 80 | Ht 70.0 in | Wt 196.0 lb

## 2021-05-16 DIAGNOSIS — E785 Hyperlipidemia, unspecified: Secondary | ICD-10-CM

## 2021-05-16 DIAGNOSIS — R5383 Other fatigue: Secondary | ICD-10-CM | POA: Diagnosis not present

## 2021-05-16 DIAGNOSIS — I4821 Permanent atrial fibrillation: Secondary | ICD-10-CM | POA: Diagnosis not present

## 2021-05-16 DIAGNOSIS — J439 Emphysema, unspecified: Secondary | ICD-10-CM

## 2021-05-16 DIAGNOSIS — Z7901 Long term (current) use of anticoagulants: Secondary | ICD-10-CM

## 2021-05-16 DIAGNOSIS — R7303 Prediabetes: Secondary | ICD-10-CM

## 2021-05-16 MED ORDER — METOPROLOL SUCCINATE ER 25 MG PO TB24: 25.0000 mg | ORAL_TABLET | Freq: Every day | ORAL | 3 refills | Status: DC

## 2021-05-17 ENCOUNTER — Telehealth (HOSPITAL_BASED_OUTPATIENT_CLINIC_OR_DEPARTMENT_OTHER): Payer: Self-pay

## 2021-05-17 DIAGNOSIS — I4821 Permanent atrial fibrillation: Secondary | ICD-10-CM

## 2021-05-17 LAB — CBC
Hematocrit: 46.4 % (ref 37.5–51.0)
Hemoglobin: 15.7 g/dL (ref 13.0–17.7)
MCH: 28.5 pg (ref 26.6–33.0)
MCHC: 33.8 g/dL (ref 31.5–35.7)
MCV: 84 fL (ref 79–97)
Platelets: 201 10*3/uL (ref 150–450)
RBC: 5.51 x10E6/uL (ref 4.14–5.80)
RDW: 14 % (ref 11.6–15.4)
WBC: 7.3 10*3/uL (ref 3.4–10.8)

## 2021-05-17 LAB — COMPREHENSIVE METABOLIC PANEL
ALT: 25 IU/L (ref 0–44)
AST: 22 IU/L (ref 0–40)
Albumin/Globulin Ratio: 2.3 — ABNORMAL HIGH (ref 1.2–2.2)
Albumin: 4.8 g/dL — ABNORMAL HIGH (ref 3.7–4.7)
Alkaline Phosphatase: 104 IU/L (ref 44–121)
BUN/Creatinine Ratio: 10 (ref 10–24)
BUN: 15 mg/dL (ref 8–27)
Bilirubin Total: 0.5 mg/dL (ref 0.0–1.2)
CO2: 22 mmol/L (ref 20–29)
Calcium: 10.2 mg/dL (ref 8.6–10.2)
Chloride: 103 mmol/L (ref 96–106)
Creatinine, Ser: 1.46 mg/dL — ABNORMAL HIGH (ref 0.76–1.27)
Globulin, Total: 2.1 g/dL (ref 1.5–4.5)
Glucose: 146 mg/dL — ABNORMAL HIGH (ref 70–99)
Potassium: 4.6 mmol/L (ref 3.5–5.2)
Sodium: 140 mmol/L (ref 134–144)
Total Protein: 6.9 g/dL (ref 6.0–8.5)
eGFR: 50 mL/min/{1.73_m2} — ABNORMAL LOW (ref >59)

## 2021-05-17 LAB — MAGNESIUM: Magnesium: 2 mg/dL (ref 1.6–2.3)

## 2021-05-17 NOTE — Telephone Encounter (Addendum)
Results called and left on VM for patient, labs ordered and placed in mail for patient!   ----- Message from Sharlene Dory, [place] sent at 05/17/2021  8:11 AM EST ----- Please let the patient know:  Your kidney function is a little worse compared to a few months ago. Please make sure you are staying hydrated and drinking 64 oz of water a day. Limit caffeine and other beverages. Your blood count is normal. Electrolytes normal. Would recommend a follow-up BMP in a few weeks to make sure your kidney function is improving and making its away back to your baseline. No medication changes at this time. Please follow-up with PCP for further work-up.   Take care,   Sharlene Dory, PA-C

## 2021-05-24 LAB — BASIC METABOLIC PANEL
BUN/Creatinine Ratio: 11 (ref 10–24)
BUN: 15 mg/dL (ref 8–27)
CO2: 22 mmol/L (ref 20–29)
Calcium: 9.6 mg/dL (ref 8.6–10.2)
Chloride: 104 mmol/L (ref 96–106)
Creatinine, Ser: 1.36 mg/dL — ABNORMAL HIGH (ref 0.76–1.27)
Glucose: 152 mg/dL — ABNORMAL HIGH (ref 70–99)
Potassium: 5 mmol/L (ref 3.5–5.2)
Sodium: 142 mmol/L (ref 134–144)
eGFR: 54 mL/min/{1.73_m2} — ABNORMAL LOW (ref >59)

## 2021-07-14 ENCOUNTER — Other Ambulatory Visit: Payer: Self-pay

## 2021-07-14 DIAGNOSIS — I714 Abdominal aortic aneurysm, without rupture, unspecified: Secondary | ICD-10-CM

## 2021-07-20 NOTE — Progress Notes (Signed)
[facility] [place] ?HISTORY AND PHYSICAL  ? ?History of Present Illness:  Patient is a 76 y.o. 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  He is followed by Dr. Donzetta Matters in our office.  He denise abdominal pain or lumbar pain.  He denies non healing wounds, claudication or rest pain.  He continues to stay active daily with swimming and other activities.  He states he is limited by SOB and fatigue since having COVID in 2021.   ?  ? ?Patient's past medical history is significant for paroxysmal atrial fibrillation and is managed on  rivaroxaban.  He is compliant with daily statin.  He is active on a daily basis with exercise and swimming.   ? ?Past Medical History:  ?Diagnosis Date  ? Atrial flutter ([place])   ? CVA (cerebral infarction)   ? embolic CVA 11/8336  ? Diabetes mellitus without complication ([place])   ? Gout   ? HLD (hyperlipidemia)   ? Hx of echocardiogram   ? a. ECHO 10/08/13: EF 45-50%, mild hypokinesis of apical myocardium. Mild LA dilation, mild RA dilation;  b.  TEE (10/09/13):  EF 50-55%, normal wall motion, no LAA clot  ? Hypertension   ? Stroke [facility])   ? ? ?Past Surgical History:  ?Procedure Laterality Date  ? ESOPHAGOGASTRODUODENOSCOPY (EGD) WITH PROPOFOL N/A 02/14/2021  ? Procedure: ESOPHAGOGASTRODUODENOSCOPY (EGD) WITH PROPOFOL;  Surgeon: Carol Ada, MD;  Location: [place];  Service: Endoscopy;  Laterality: N/A;  ? HEMOSTASIS CLIP PLACEMENT  02/14/2021  ? Procedure: HEMOSTASIS CLIP PLACEMENT;  Surgeon: Carol Ada, MD;  Location: [place];  Service: Endoscopy;;  ? TEE WITHOUT CARDIOVERSION N/A 10/09/2013  ? Procedure: TRANSESOPHAGEAL ECHOCARDIOGRAM (TEE);  Surgeon: Pixie Casino, MD;  Location: [place];  Service: Cardiovascular;  Laterality: N/A;  ? ? ? ?Social History ?Social History  ? ?Tobacco Use  ? Smoking status: Former  ?  Packs/day: 0.50  ?  Years: 15.00  ?  Pack years: 7.50  ?  Types:  Cigarettes  ?  Quit date: 07/13/2015  ?  Years since quitting: 6.0  ?  Passive exposure: Never  ? Smokeless tobacco: Never  ?Vaping Use  ? Vaping Use: Never used  ?Substance Use Topics  ? Alcohol use: No  ?  Alcohol/week: 0.0 standard drinks  ? Drug use: No  ? ? ?Family History ?Family History  ?Problem Relation Age of Onset  ? Hypertension Mother   ? Stroke Mother   ? Hypertension Father   ? Heart attack Neg Hx   ? ? ?Allergies ? ?Allergies  ?Allergen Reactions  ? Cyclobenzaprine Nausea Only  ? Indomethacin Rash  ? ? ? ?Current Outpatient Medications  ?Medication Sig Dispense Refill  ? acetaminophen (TYLENOL) 650 MG CR tablet Take 1,300 mg by mouth every 8 (eight) hours as needed for pain.    ? allopurinol (ZYLOPRIM) 300 MG tablet Take 300 mg by mouth daily after breakfast.     ? atorvastatin (LIPITOR) 10 MG tablet TAKE 1 TABLET BY MOUTH DAILY AT 6 PM 90 tablet 2  ? blood glucose meter kit and supplies KIT Dispense based on patient and insurance preference. Use up to four times daily as directed. (FOR ICD-9 250.00, 250.01). 1 each 0  ? COENZYME Q10 PO Take 1 tablet by mouth daily.     ? GLUCOSAMINE-CHONDROITIN PO Take 1 tablet by mouth daily.     ? metoprolol succinate (TOPROL XL)  25 MG 24 hr tablet Take 1 tablet (25 mg total) by mouth daily. 90 tablet 3  ? Multiple Vitamin (MULTIVITAMIN) tablet Take 1 tablet by mouth daily.    ? Omega-3 Fatty Acids (FISH OIL PO) Take 2 tablets by mouth daily.    ? pantoprazole (PROTONIX) 40 MG tablet Take 1 tablet (40 mg total) by mouth daily. 30 tablet 1  ? PROLIA 60 MG/ML SOSY injection Inject 60 mg into the skin every 6 (six) months.    ? rivaroxaban (XARELTO) 20 MG TABS tablet TAKE 1 TABLET(20 MG) BY MOUTH DAILY 90 tablet 1  ? ?No current facility-administered medications for this visit.  ? ? ?ROS:  ? ?General:  No weight loss, Fever, chills ? ?HEENT: No recent headaches, no nasal bleeding, no visual changes, no sore throat ? ?Neurologic: No dizziness, blackouts, seizures. No  recent symptoms of stroke or mini- stroke. No recent episodes of slurred speech, or temporary blindness. ? ?Cardiac: No recent episodes of chest pain/pressure, + shortness of breath at rest.  + shortness of breath with exertion.  Denies history of atrial fibrillation or irregular heartbeat ? ?Vascular: No history of rest pain in feet.  No history of claudication.  No history of non-healing ulcer, No history of DVT  ? ?Pulmonary: No home oxygen, no productive cough, no hemoptysis,  No asthma or wheezing ? ?Musculoskeletal:  [ ] Arthritis, [ ] Low back pain,  [ ] Joint pain ? ?Hematologic:No history of hypercoagulable state.  No history of easy bleeding.  No history of anemia ? ?Gastrointestinal: No hematochezia or melena,  No gastroesophageal reflux, no trouble swallowing ? ?Urinary: [ ] chronic Kidney disease, [ ] on HD - [ ] MWF or [ ] TTHS, [ ] Burning with urination, [ ] Frequent urination, [ ] Difficulty urinating;  ? ?Skin: No rashes ? ?Psychological: No history of anxiety,  No history of depression ? ? ?Physical Examination ? ?Vitals:  ? 07/21/21 0916  ?BP: (!) 156/99  ?Pulse: 72  ?Resp: 20  ?Temp: 97.9 ?F (36.6 ?C)  ?TempSrc: Temporal  ?SpO2: 97%  ?Weight: 195 lb 6.4 oz (88.6 kg)  ?Height: 5' 10" (1.778 m)  ? ? ?Body mass index is 28.04 kg/m?. ? ?General:  Alert and oriented, no acute distress ?HEENT: Normal ?Neck: No bruit or JVD ?Pulmonary: Clear to auscultation bilaterally ?Cardiac: Regular Rate and Rhythm without murmur ?Gastrointestinal: Soft, non-tender, non-distended, no mass, no scars ?Skin: No rash ?Extremity Pulses:  2+ radial, femoral, dorsalis pedis,  pulses bilaterally ?Musculoskeletal: No deformity or edema  ?Neurologic: Upper and lower extremity motor 5/5 and symmetric ? ?DATA:  ?   ?Abdominal Aorta Findings:  ?+-------------+-------+----------+----------+--------+--------+--------+  ?Location     AP (cm)Trans (cm)PSV (cm/s)WaveformThrombusComments   ?+-------------+-------+----------+----------+--------+--------+--------+  ?Proximal     1.89   2.00      43                                  ?+-------------+-------+----------+----------+--------+--------+--------+  ?Mid          3.90   3.60      47                                  ?+-------------+-------+----------+----------+--------+--------+--------+  ?Distal       4.30   4.40      38                                  ?+-------------+-------+----------+----------+--------+--------+--------+  ?  RT CIA Prox  3.4    3.2       48                                  ?+-------------+-------+----------+----------+--------+--------+--------+  ?RT CIA Mid   0.9              190                                 ?+-------------+-------+----------+----------+--------+--------+--------+  ?RT EIA Prox  0.9    0.9                                           ?+-------------+-------+----------+----------+--------+--------+--------+  ?RT EIA Mid   0.8    0.8       115                                 ?+-------------+-------+----------+----------+--------+--------+--------+  ?RT EIA Distal0.7    0.7       69                                  ?+-------------+-------+----------+----------+--------+--------+--------+  ?LT CIA Prox  1.2    1.1       54                                  ?+-------------+-------+----------+----------+--------+--------+--------+  ?LT CIA Mid   0.8    0.8       74                                  ?+-------------+-------+----------+----------+--------+--------+--------+  ?LT EIA Mid   0.8    0.8       101                                 ?+-------------+-------+----------+----------+--------+--------+--------+  ?LT EIA Distal0.9    0.9       101                                 ?+-------------+-------+----------+----------+--------+--------+--------+  ? ? ? ?  ?   ?Summary:  ?Abdominal Aorta: There is evidence of abnormal  dilatation of the distal  ?Abdominal aorta. There is evidence of abnormal dilation of the Right  ?Common Iliac artery. The largest aortic diameter remains essentially  ?unchanged compared to prior exam. Previous  ?aortic diameter measurement was 4.3 cm obtained on 05/31/2020. The largest  ?common ilia artery appears increased, prior measurement wa

## 2021-07-21 ENCOUNTER — Ambulatory Visit (HOSPITAL_COMMUNITY)
Admission: RE | Admit: 2021-07-21 | Discharge: 2021-07-21 | Disposition: A | Discharge status: Home / Self Care | Payer: Medicare Other | Source: Ambulatory Visit | Attending: Vascular Surgery | Admitting: Vascular Surgery

## 2021-07-21 ENCOUNTER — Ambulatory Visit: Payer: Medicare Other | Admitting: Physician Assistant

## 2021-07-21 VITALS — BP 156/99 | HR 72 | Temp 97.9°F | Resp 20 | Ht 70.0 in | Wt 195.4 lb

## 2021-07-21 DIAGNOSIS — I714 Abdominal aortic aneurysm, without rupture, unspecified: Secondary | ICD-10-CM | POA: Insufficient documentation

## 2021-07-28 ENCOUNTER — Other Ambulatory Visit: Payer: Self-pay | Admitting: *Deleted

## 2021-07-28 DIAGNOSIS — I723 Aneurysm of iliac artery: Secondary | ICD-10-CM

## 2021-08-15 ENCOUNTER — Ambulatory Visit: Payer: Medicare Other | Admitting: Physician Assistant

## 2021-08-16 NOTE — Progress Notes (Unsigned)
Office Visit    Patient Name: Jonathon Stevens Date of Encounter: 08/17/2021  Primary Care Provider:  Christain Sacramento, MD Primary Cardiologist:  Larae Grooms, MD Primary Electrophysiologist: None  Chief Complaint    Jonathon Stevens is a 76 y.o. male with PMH of CVA,AAA, right common iliac aneurysm, persistent atrial fibrillation/flutter, hyperlipidemia, pulmonary nodules, pulmonary fibrosis, COPD, former tobacco abuse, CKD stage 3a, esophageal bleed s/p clipping 01/2021, coronary artery calcification on CT 2021, diabetes mellitus, and hypertension presents today for follow up of shortness of breath.   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 ECHO 10/08/13: EF 45-50%, mild hypokinesis of apical myocardium. Mild LA dilation, mild RA dilation;  b.  TEE (10/09/13):  EF 50-55%, normal wall motion, no LAA clot   Hypertension    Stroke [facility])    Past Surgical History:  Procedure Laterality Date   ESOPHAGOGASTRODUODENOSCOPY (EGD) WITH PROPOFOL N/A 02/14/2021   Procedure: ESOPHAGOGASTRODUODENOSCOPY (EGD) WITH PROPOFOL;  Surgeon: Carol Ada, MD;  Location: [place];  Service: Endoscopy;  Laterality: N/A;   HEMOSTASIS CLIP PLACEMENT  02/14/2021   Procedure: HEMOSTASIS CLIP PLACEMENT;  Surgeon: Carol Ada, MD;  Location: [place];  Service: Endoscopy;;   TEE WITHOUT CARDIOVERSION N/A 10/09/2013   Procedure: TRANSESOPHAGEAL ECHOCARDIOGRAM (TEE);  Surgeon: Pixie Casino, MD;  Location: [facility] ENDOSCOPY;  Service: Cardiovascular;  Laterality: N/A;    Allergies  Allergies  Allergen Reactions   Cyclobenzaprine Nausea Only   Indomethacin Rash    History of Present Illness    Jonathon Stevens has a PMH of CVA,AAA, right common iliac aneurysm, persistent atrial  fibrillation/flutter, hyperlipidemia, pulmonary nodules, pulmonary fibrosis, COPD, former tobacco abuse, CKD stage 3a, esophageal bleed s/p clipping 01/2021, coronary artery calcification on CT 2021, diabetes mellitus, and hypertension. He was diagnosed with embolic CVA in 04/8784 and received tPA. He developed atrial flutter with RVR while hospitalized but converted to NSR and was started on Xarelto. 2D echo was completed with EF of 45-50%, mild hypokinesis of apical myocardium with mild LA dilation and mild RA dilation.  TEE was completed the following day that revealed EF of 50-55% with normal wall motion.  No ischemic evaluation was completed due to lack of symptoms and normalized EF on follow-up.  Since that time he has been in and out of atrial fib/flutter, with occasional sinus bradycardia. He developed COVID pneumonia on 03/2019 that improved with remdesivir and Actemra antibiotics.  Patient developed A-fib with RVR and metoprolol was increased to 100 mg twice daily.  Patient was then converted to sinus rhythm and metoprolol decreased to 12.5 mg twice daily in follow-up for sinus bradycardia. This appears to be the last time he was in NSR by EKG.  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  Of note, this also demonstrated heavy coronary artery calcification. He saw [facility] pulmonology last in 2021 who felt COPD was primarily contributing and recommended 6 month follow-up, but he has not seen them since.   Jonathon Stevens presented to [facility] long ED on 01/2021 with complaints of dizziness and found to have acute blood loss anemia due to bleeding esophageal vessel  that required clipping.  He was found to be in A-fib with RVR and given Cardizem drip however was unable to tolerate due to low BP and was started on amio drip with improved heart rate.  It looks like this improved HR  but did not convert him. He was seen on 04/15/2021 by Nicholes Rough, PA with complaint of shortness of breath and underwent Lexiscan stress test that revealed normal findings with no ischemia.  He was seen in follow-up on 05/16/2021 with complaints still of fatigue and metoprolol tartrate was switched to metoprolol succinate.  Since last being seen in the office patient reports still feeling extremely fatigued and short of breath with ADLs such as washing dishes and walking to the mailbox.  His blood pressure today was 130/80 with pulse rate of 86 with O2 sat of 94% on room air. He endorses occasional lightheadedness and hot flash in the morning following his breakfast.  He denies any presyncope or syncope following the symptoms.  He still stays active however he has to push through his fatigue and feels worse when he has finished the activity.  He denies any bleeding since his gastric clipping in November and reports no blood in his stool or urine. Recent CBC normal reassuring for any occult blood loss. Patient is frustrated at his decline. Patient denies chest pain, palpitations, PND, orthopnea, nausea, vomiting, syncope, edema, weight gain, or early satiety.  Home Medications    Current Outpatient Medications  Medication Sig Dispense Refill   acetaminophen (TYLENOL) 650 MG CR tablet Take 1,300 mg by mouth every 8 (eight) hours as needed for pain.     allopurinol (ZYLOPRIM) 300 MG tablet Take 300 mg by mouth daily after breakfast.      atorvastatin (LIPITOR) 10 MG tablet TAKE 1 TABLET BY MOUTH DAILY AT 6 PM 90 tablet 2   blood glucose meter kit and supplies KIT Dispense based on patient and insurance preference. Use up to four times daily as directed. (FOR ICD-9 250.00, 250.01). 1 each 0   COENZYME Q10 PO Take 1 tablet by mouth daily.      GLUCOSAMINE-CHONDROITIN PO Take 1 tablet by mouth daily.      Multiple Vitamin (MULTIVITAMIN) tablet Take 1 tablet by mouth daily.     Omega-3 Fatty Acids (FISH OIL  PO) Take 2 tablets by mouth daily.     pantoprazole (PROTONIX) 40 MG tablet Take 1 tablet (40 mg total) by mouth daily. 30 tablet 1   PROLIA 60 MG/ML SOSY injection Inject 60 mg into the skin every 6 (six) months.     rivaroxaban (XARELTO) 20 MG TABS tablet TAKE 1 TABLET(20 MG) BY MOUTH DAILY 90 tablet 1   metoprolol succinate (TOPROL XL) 25 MG 24 hr tablet Take 1.5 tablets (37.5 mg total) by mouth daily. 90 tablet 3   No current facility-administered medications for this visit.     Review of Systems  Please see the history of present illness.    (+) Generalized fatigue (+) Shortness of breath with exertion  All other systems reviewed and are otherwise negative except as noted above.  Physical Exam    Wt Readings from Last 3 Encounters:  08/17/21 189 lb (85.7 kg)  07/21/21 195 lb 6.4 oz (88.6 kg)  05/16/21 196 lb (88.9 kg)   VS: Vitals:   08/17/21 0850  BP: 130/80  Pulse: 86  SpO2: 94%  ,Body mass index is 27.12 kg/m.  Constitutional:      Appearance: Well-nourished appearance. Not  in distress.  Neck:     Vascular: JVD normal.  Pulmonary:     Effort: Pulmonary effort is normal.     Breath sounds: No wheezing. No rales. Diminished in the bases Cardiovascular:     Irregularly irregular, normal S1. Normal S2.      Murmurs: There is no murmur.  Edema:    Peripheral edema absent.  Abdominal:     Palpations: Abdomen is soft non tender. There is no hepatomegaly.  Skin:    General: Skin is warm and dry.  Neurological:     General: No focal deficit present.     Mental Status: Alert and oriented to person, place and time.     Cranial Nerves: Cranial nerves are intact.  EKG/LABS/Other Studies Reviewed    ECG personally reviewed by me today -none completed today  Risk Assessment/Calculations:    CHA2DS2-VASc Score = 6   This indicates a 9.7% annual risk of stroke. The patient's score is based upon: CHF History: 0 HTN History: 1 Diabetes History: 0 Stroke History:  2 Vascular Disease History: 1 Age Score: 2 Gender Score: 0        Lab Results  Component Value Date   WBC 7.3 05/16/2021   HGB 15.7 05/16/2021   HCT 46.4 05/16/2021   MCV 84 05/16/2021   PLT 201 05/16/2021   Lab Results  Component Value Date   CREATININE 1.36 (H) 05/23/2021   BUN 15 05/23/2021   NA 142 05/23/2021   K 5.0 05/23/2021   CL 104 05/23/2021   CO2 22 05/23/2021   Lab Results  Component Value Date   ALT 25 05/16/2021   AST 22 05/16/2021   ALKPHOS 104 05/16/2021   BILITOT 0.5 05/16/2021   Lab Results  Component Value Date   CHOL 135 04/18/2021   HDL 47 04/18/2021   LDLCALC 66 04/18/2021   TRIG 121 04/18/2021   CHOLHDL 2.9 04/18/2021    Lab Results  Component Value Date   HGBA1C 6.3 (H) 02/13/2021    Assessment & Plan    Dyspnea on exertion -Onset began 03/2019 after Covid infection. 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 Irish Lack regarding R/LHC  2.  Atrial fibrillation/flutter: -Patient was diagnosed with paroxysmal atrial fib/flutter in 5643 following embolic CVA. This has been been remotely diagnosed as permanent, though was back in NSR/SB in 03/2019 -> managed with rate control strategy. He seems to have been in persistent at least since 09/2020 and onward. It is unclear to what degree his arrhythmia is contributing as he was fatigued even when in normal rhythm in 03/2019 when symptoms first started -Metoprolol succinate increased to 37.5 to obtain better rate control with atrial fibrillation.  Possible increased  titration if fatigue not noted with increased dose. -14-day ZIO monitor to assess for AF burden/HR control as possible contributor to chronic fatigue (suspect permanent Afib/flutter at this point as previously referenced but would be helpful to see HR trends) -chronic anticoagulation with Xarelto 20 mg with no reports of bleeding and most recent CBC within normal limits -His creatinine clearance is really on the border for his Xarelto dosing by  last labs, will check BMET today to assess appropriateness of dosing (if he continues to waffle on the border of 15->32m dose, consideration could be given to switching to Eliquis) -CHA2DS2-VASc Score = 6 [CHF History: 0, HTN History: 1, Diabetes History: 0, Stroke History: 2, Vascular Disease History: 1, Age Score: 2, Gender Score: 0].  Therefore, the patient's annual risk of stroke is 9.7 %.      3.  Pulmonary fibrosis/lung nodule by CT 2021: -Patient denies any increased coughing however does endorse shortness of breath with some ADLs. -[facility] -Recommended for patient to follow-up in 6 months and CT in 12 months however not completed. -Referral to pulmonology for further evaluation, patient agreeable to local referral -High-resolution CT to assess pulmonology and lung nodule. -Patient's O2 sat on arrival was 94% and was walked by CMA and O2 sats did not decrease and remained at 94-95%  4.  History of CVA: -Patient endorses no residual effects of CVA from 2015 -Continue Xarelto 20 mg daily and atorvastatin 10 mg daily  5. Coronary artery calcification: -Noted on CT 06/2019 -Recent stress test without ischemia -Not on ASA due to Xarelto -Continue statin, lipids followed by PCP (LDL 66)  6.  Abdominal aortic aneurysm: -Patient followed by [facility] with recent ultrasound showing no increase in size of aneurysm  Disposition: Follow-up with JLarae Grooms MD  in 8  weeks     Medication Adjustments/Labs and Tests Ordered: Current medicines are reviewed at length with the patient today.  Concerns regarding medicines are outlined above.   Signed, DMable Fill EMarissa Nestle NP 08/17/2021, 9:37 AM [place]

## 2021-08-17 ENCOUNTER — Encounter: Payer: Self-pay | Admitting: Nurse Practitioner

## 2021-08-17 ENCOUNTER — Ambulatory Visit (INDEPENDENT_AMBULATORY_CARE_PROVIDER_SITE_OTHER): Payer: Medicare Other

## 2021-08-17 ENCOUNTER — Ambulatory Visit: Payer: Medicare Other | Admitting: Nurse Practitioner

## 2021-08-17 VITALS — BP 130/80 | HR 86 | Ht 70.0 in | Wt 189.0 lb

## 2021-08-17 DIAGNOSIS — J441 Chronic obstructive pulmonary disease with (acute) exacerbation: Secondary | ICD-10-CM | POA: Diagnosis not present

## 2021-08-17 DIAGNOSIS — Z8673 Personal history of transient ischemic attack (TIA), and cerebral infarction without residual deficits: Secondary | ICD-10-CM

## 2021-08-17 DIAGNOSIS — J84112 Idiopathic pulmonary fibrosis: Secondary | ICD-10-CM

## 2021-08-17 DIAGNOSIS — E785 Hyperlipidemia, unspecified: Secondary | ICD-10-CM

## 2021-08-17 DIAGNOSIS — I480 Paroxysmal atrial fibrillation: Secondary | ICD-10-CM | POA: Diagnosis not present

## 2021-08-17 DIAGNOSIS — I714 Abdominal aortic aneurysm, without rupture, unspecified: Secondary | ICD-10-CM

## 2021-08-17 LAB — BASIC METABOLIC PANEL
BUN/Creatinine Ratio: 10 (ref 10–24)
BUN: 14 mg/dL (ref 8–27)
CO2: 24 mmol/L (ref 20–29)
Calcium: 10 mg/dL (ref 8.6–10.2)
Chloride: 100 mmol/L (ref 96–106)
Creatinine, Ser: 1.42 mg/dL — ABNORMAL HIGH (ref 0.76–1.27)
Glucose: 147 mg/dL — ABNORMAL HIGH (ref 70–99)
Potassium: 5.1 mmol/L (ref 3.5–5.2)
Sodium: 140 mmol/L (ref 134–144)
eGFR: 51 mL/min/{1.73_m2} — ABNORMAL LOW (ref >59)

## 2021-08-17 MED ORDER — METOPROLOL SUCCINATE ER 25 MG PO TB24: 37.5000 mg | ORAL_TABLET | Freq: Every day | ORAL | 3 refills | Status: DC

## 2021-08-17 NOTE — Patient Instructions (Signed)
Medication Instructions:   INCREASE Toprol one and one half tablet by mouth ( 37.5 mg ) daily.   *If you need a refill on your cardiac medications before your next appointment, please call your pharmacy*   Lab Work:  TODAY!!!!  BMET  If you have labs (blood work) drawn today and your tests are completely normal, you will receive your results only by: [place] (if you have MyChart) OR A paper copy in the mail If you have any lab test that is abnormal or we need to change your treatment, we will call you to review the results.   Testing/Procedures:  HIGH RESOLUTION CT  AT [facility]. Non-Cardiac CT scanning, (CAT scanning), is a noninvasive, special x-ray that produces cross-sectional images of the body using x-rays and a computer. CT scans help physicians diagnose and treat medical conditions. For some CT exams, a contrast material is used to enhance visibility in the area of the body being studied. CT scans provide greater clarity and reveal more details than regular x-ray exams.  ZIO XT- Long Term Monitor Instructions  Your physician has requested you wear a ZIO patch monitor for 14 days.  This is a single patch monitor. [employer] supplies one patch monitor per enrollment. Additional stickers are not available. Please do not apply patch if you will be having a Nuclear Stress Test,  Echocardiogram, Cardiac CT, MRI, or Chest Xray during the period you would be wearing the  monitor. The patch cannot be worn during these tests. You cannot remove and re-apply the  ZIO XT patch monitor.  Your ZIO patch monitor will be mailed 3 day USPS to your address on file. It may take 3-5 days  to receive your monitor after you have been enrolled.  Once you have received your monitor, please review the enclosed instructions. Your monitor  has already been registered assigning a specific monitor serial # to you.  Billing and Patient Assistance Program Information  We have supplied  [employer] with any of your insurance information on file for billing purposes. [employer] offers a sliding scale Patient Assistance Program for patients that do not have  insurance, or whose insurance does not completely cover the cost of the ZIO monitor.  You must apply for the Patient Assistance Program to qualify for this discounted rate.  To apply, please call [employer] at [phone], select option 4, select option 2, ask to apply for  Patient Assistance Program. Theodore Demark will ask your household income, and how many people  are in your household. They will quote your out-of-pocket cost based on that information.  [employer] will also be able to set up a 67-month, interest-free payment plan if needed.  Applying the monitor   Shave hair from upper left chest.  Hold abrader disc by orange tab. Rub abrader in 40 strokes over the upper left chest as  indicated in your monitor instructions.  Clean area with 4 enclosed alcohol pads. Let dry.  Apply patch as indicated in monitor instructions. Patch will be placed under collarbone on left  side of chest with arrow pointing upward.  Rub patch adhesive wings for 2 minutes. Remove white label marked "1". Remove the white  label marked "2". Rub patch adhesive wings for 2 additional minutes.  While looking in a mirror, press and release button in center of patch. A small green light will  flash 3-4 times. This will be your only indicator that the monitor has been turned on.  Do not shower for the  first 24 hours. You may shower after the first 24 hours.  Press the button if you feel a symptom. You will hear a small click. Record Date, Time and  Symptom in the Patient Logbook.  When you are ready to remove the patch, follow instructions on the last 2 pages of Patient  Logbook. Stick patch monitor onto the last page of Patient Logbook.  Place Patient Logbook in the blue and white box. Use locking tab on box and tape box closed  securely. The blue and white box  has prepaid postage on it. Please place it in the mailbox as  soon as possible. Your physician should have your test results approximately 7 days after the  monitor has been mailed back to [facility].  Call [place] at [phone] if you have questions regarding  your ZIO XT patch monitor. Call them immediately if you see an orange light blinking on your  monitor.  If your monitor falls off in less than 4 days, contact our Monitor department at [phone].  If your monitor becomes loose or falls off after 4 days call [employer] at [phone] for  suggestions on securing your monitor   Follow-Up: At [facility], you and your health needs are our priority.  As part of our continuing mission to provide you with exceptional heart care, we have created designated Provider Care Teams.  These Care Teams include your primary Cardiologist (physician) and Advanced Practice Providers (APPs -  Physician Assistants and Nurse Practitioners) who all work together to provide you with the care you need, when you need it.  We recommend signing up for the patient portal called "MyChart".  Sign up information is provided on this After Visit Summary.  MyChart is used to connect with patients for Virtual Visits (Telemedicine).  Patients are able to view lab/test results, encounter notes, upcoming appointments, etc.  Non-urgent messages can be sent to your provider as well.   To learn more about what you can do with MyChart, go to [email or web address].    Your next appointment:   8 week(s)  The format for your next appointment:   In Person  Provider:   Larae Grooms, MD     Other Instructions  You have been referred to Pulmonology.  The office will call you to schedule appointment.   Heart-Healthy Eating Plan Many factors influence your heart (coronary) health, including eating and exercise habits. Coronary risk increases with abnormal blood fat (lipid) levels.  Heart-healthy meal planning includes limiting unhealthy fats, increasing healthy fats, and making other diet and lifestyle changes. What is my plan? Your health care provider may recommend that you: Limit your fat intake to _________% or less of your total calories each day. Limit your saturated fat intake to _________% or less of your total calories each day. Limit the amount of cholesterol in your diet to less than _________ mg per day. What are tips for following this plan? Cooking Cook foods using methods other than frying. Baking, boiling, grilling, and broiling are all good options. Other ways to reduce fat include: Removing the skin from poultry. Removing all visible fats from meats. Steaming vegetables in water or broth. Meal planning  At meals, imagine dividing your plate into fourths: Fill one-half of your plate with vegetables and green salads. Fill one-fourth of your plate with whole grains. Fill one-fourth of your plate with lean protein foods. Eat 4-5 servings of vegetables per day. One serving equals 1 cup raw or cooked vegetable, or 2  cups raw leafy greens. Eat 4-5 servings of fruit per day. One serving equals 1 medium whole fruit,  cup dried fruit,  cup fresh, frozen, or canned fruit, or  cup 100% fruit juice. Eat more foods that contain soluble fiber. Examples include apples, broccoli, carrots, beans, peas, and barley. Aim to get 25-30 g of fiber per day. Increase your consumption of legumes, nuts, and seeds to 4-5 servings per week. One serving of dried beans or legumes equals  cup cooked, 1 serving of nuts is  cup, and 1 serving of seeds equals 1 tablespoon. Fats Choose healthy fats more often. Choose monounsaturated and polyunsaturated fats, such as olive and canola oils, flaxseeds, walnuts, almonds, and seeds. Eat more omega-3 fats. Choose salmon, mackerel, sardines, tuna, flaxseed oil, and ground flaxseeds. Aim to eat fish at least 2 times each week. Check food  labels carefully to identify foods with trans fats or high amounts of saturated fat. Limit saturated fats. These are found in animal products, such as meats, butter, and cream. Plant sources of saturated fats include palm oil, palm kernel oil, and coconut oil. Avoid foods with partially hydrogenated oils in them. These contain trans fats. Examples are stick margarine, some tub margarines, cookies, crackers, and other baked goods. Avoid fried foods. General information Eat more home-cooked food and less restaurant, buffet, and fast food. Limit or avoid alcohol. Limit foods that are high in starch and sugar. Lose weight if you are overweight. Losing just 5-10% of your body weight can help your overall health and prevent diseases such as diabetes and heart disease. Monitor your salt (sodium) intake, especially if you have high blood pressure. Talk with your health care provider about your sodium intake. Try to incorporate more vegetarian meals weekly. What foods can I eat? Fruits All fresh, canned (in natural juice), or frozen fruits. Vegetables Fresh or frozen vegetables (raw, steamed, roasted, or grilled). Green salads. Grains Most grains. Choose whole wheat and whole grains most of the time. Rice and pasta, including brown rice and pastas made with whole wheat. Meats and other proteins Lean, well-trimmed beef, veal, pork, and lamb. Chicken and [place] without skin. All fish and shellfish. Wild duck, rabbit, pheasant, and venison. Egg whites or low-cholesterol egg substitutes. Dried beans, peas, lentils, and tofu. Seeds and most nuts. Dairy Low-fat or nonfat cheeses, including ricotta and mozzarella. Skim or 1% milk (liquid, powdered, or evaporated). Buttermilk made with low-fat milk. Nonfat or low-fat yogurt. Fats and oils Non-hydrogenated (trans-free) margarines. Vegetable oils, including soybean, sesame, sunflower, olive, peanut, safflower, corn, canola, and cottonseed. Salad dressings or  mayonnaise made with a vegetable oil. Beverages Water (mineral or sparkling). Coffee and tea. Diet carbonated beverages. Sweets and desserts Sherbet, gelatin, and fruit ice. Small amounts of dark chocolate. Limit all sweets and desserts. Seasonings and condiments All seasonings and condiments. The items listed above may not be a complete list of foods and beverages you can eat. Contact a dietitian for more options. What foods are not recommended? Fruits Canned fruit in heavy syrup. Fruit in cream or butter sauce. Fried fruit. Limit coconut. Vegetables Vegetables cooked in cheese, cream, or butter sauce. Fried vegetables. Grains Breads made with saturated or trans fats, oils, or whole milk. Croissants. Sweet rolls. Donuts. High-fat crackers, such as cheese crackers. Meats and other proteins Fatty meats, such as hot dogs, ribs, sausage, bacon, rib-eye roast or steak. High-fat deli meats, such as salami and bologna. Caviar. Domestic duck and goose. Organ meats, such as liver. Dairy Cream,  sour cream, cream cheese, and creamed cottage cheese. Whole-milk cheeses. Whole or 2% milk (liquid, evaporated, or condensed). Whole buttermilk. Cream sauce or high-fat cheese sauce. Whole-milk yogurt. Fats and oils Meat fat, or shortening. Cocoa butter, hydrogenated oils, palm oil, coconut oil, palm kernel oil. Solid fats and shortenings, including bacon fat, salt pork, lard, and butter. Nondairy cream substitutes. Salad dressings with cheese or sour cream. Beverages Regular sodas and any drinks with added sugar. Sweets and desserts Frosting. Pudding. Cookies. Cakes. Pies. Milk chocolate or white chocolate. Buttered syrups. Full-fat ice cream or ice cream drinks. The items listed above may not be a complete list of foods and beverages to avoid. Contact a dietitian for more information. Summary Heart-healthy meal planning includes limiting unhealthy fats, increasing healthy fats, and making other diet and  lifestyle changes. Lose weight if you are overweight. Losing just 5-10% of your body weight can help your overall health and prevent diseases such as diabetes and heart disease. Focus on eating a balance of foods, including fruits and vegetables, low-fat or nonfat dairy, lean protein, nuts and legumes, whole grains, and heart-healthy oils and fats. This information is not intended to replace advice given to you by your health care provider. Make sure you discuss any questions you have with your health care provider. Document Revised: 07/22/2020 Document Reviewed: 07/22/2020 Elsevier Patient Education  2022 [place].   Low-Sodium Eating Plan Sodium, which is an element that makes up salt, helps you maintain a healthy balance of fluids in your body. Too much sodium can increase your blood pressure and cause fluid and waste to be held in your body. Your health care provider or dietitian may recommend following this plan if you have high blood pressure (hypertension), kidney disease, liver disease, or heart failure. Eating less sodium can help lower your blood pressure, reduce swelling, and protect your heart, liver, and kidneys. What are tips for following this plan? Reading food labels The Nutrition Facts label lists the amount of sodium in one serving of the food. If you eat more than one serving, you must multiply the listed amount of sodium by the number of servings. Choose foods with less than 140 mg of sodium per serving. Avoid foods with 300 mg of sodium or more per serving. Shopping  Look for lower-sodium products, often labeled as "low-sodium" or "no salt added." Always check the sodium content, even if foods are labeled as "unsalted" or "no salt added." Buy fresh foods. Avoid canned foods and pre-made or frozen meals. Avoid canned, cured, or processed meats. Buy breads that have less than 80 mg of sodium per slice. Cooking  Eat more home-cooked food and less restaurant, buffet,  and fast food. Avoid adding salt when cooking. Use salt-free seasonings or herbs instead of table salt or sea salt. Check with your health care provider or pharmacist before using salt substitutes. Cook with plant-based oils, such as canola, sunflower, or olive oil. Meal planning When eating at a restaurant, ask that your food be prepared with less salt or no salt, if possible. Avoid dishes labeled as brined, pickled, cured, smoked, or made with soy sauce, miso, or teriyaki sauce. Avoid foods that contain MSG (monosodium glutamate). MSG is sometimes added to [place] food, bouillon, and some canned foods. Make meals that can be grilled, baked, poached, roasted, or steamed. These are generally made with less sodium. General information Most people on this plan should limit their sodium intake to 1,500-2,000 mg (milligrams) of sodium each day. What foods  should I eat? Fruits Fresh, frozen, or canned fruit. Fruit juice. Vegetables Fresh or frozen vegetables. "No salt added" canned vegetables. "No salt added" tomato sauce and paste. Low-sodium or reduced-sodium tomato and vegetable juice. Grains Low-sodium cereals, including oats, puffed wheat and rice, and shredded wheat. Low-sodium crackers. Unsalted rice. Unsalted pasta. Low-sodium bread. Whole-grain breads and whole-grain pasta. Meats and other proteins Fresh or frozen (no salt added) meat, poultry, seafood, and fish. Low-sodium canned tuna and salmon. Unsalted nuts. Dried peas, beans, and lentils without added salt. Unsalted canned beans. Eggs. Unsalted nut butters. Dairy Milk. Soy milk. Cheese that is naturally low in sodium, such as ricotta cheese, fresh mozzarella, or Swiss cheese. Low-sodium or reduced-sodium cheese. Cream cheese. Yogurt. Seasonings and condiments Fresh and dried herbs and spices. Salt-free seasonings. Low-sodium mustard and ketchup. Sodium-free salad dressing. Sodium-free light mayonnaise. Fresh or refrigerated horseradish.  Lemon juice. Vinegar. Other foods Homemade, reduced-sodium, or low-sodium soups. Unsalted popcorn and pretzels. Low-salt or salt-free chips. The items listed above may not be a complete list of foods and beverages you can eat. Contact a dietitian for more information. What foods should I avoid? Vegetables Sauerkraut, pickled vegetables, and relishes. Olives. [place] fries. Onion rings. Regular canned vegetables (not low-sodium or reduced-sodium). Regular canned tomato sauce and paste (not low-sodium or reduced-sodium). Regular tomato and vegetable juice (not low-sodium or reduced-sodium). Frozen vegetables in sauces. Grains Instant hot cereals. Bread stuffing, pancake, and biscuit mixes. Croutons. Seasoned rice or pasta mixes. Noodle soup cups. Boxed or frozen macaroni and cheese. Regular salted crackers. Self-rising flour. Meats and other proteins Meat or fish that is salted, canned, smoked, spiced, or pickled. Precooked or cured meat, such as sausages or meat loaves. Tomasa Blase. Ham. Pepperoni. Hot dogs. Corned beef. Chipped beef. Salt pork. Jerky. Pickled herring. Anchovies and sardines. Regular canned tuna. Salted nuts. Dairy Processed cheese and cheese spreads. Hard cheeses. Cheese curds. Blue cheese. Feta cheese. String cheese. Regular cottage cheese. Buttermilk. Canned milk. Fats and oils Salted butter. Regular margarine. Ghee. Bacon fat. Seasonings and condiments Onion salt, garlic salt, seasoned salt, table salt, and sea salt. Canned and packaged gravies. Worcestershire sauce. Tartar sauce. Barbecue sauce. Teriyaki sauce. Soy sauce, including reduced-sodium. Steak sauce. Fish sauce. Oyster sauce. Cocktail sauce. Horseradish that you find on the shelf. Regular ketchup and mustard. Meat flavorings and tenderizers. Bouillon cubes. Hot sauce. Pre-made or packaged marinades. Pre-made or packaged taco seasonings. Relishes. Regular salad dressings. Salsa. Other foods Salted popcorn and pretzels. Corn  chips and puffs. Potato and tortilla chips. Canned or dried soups. Pizza. Frozen entrees and pot pies. The items listed above may not be a complete list of foods and beverages you should avoid. Contact a dietitian for more information. Summary Eating less sodium can help lower your blood pressure, reduce swelling, and protect your heart, liver, and kidneys. Most people on this plan should limit their sodium intake to 1,500-2,000 mg (milligrams) of sodium each day. Canned, boxed, and frozen foods are high in sodium. Restaurant foods, fast foods, and pizza are also very high in sodium. You also get sodium by adding salt to food. Try to cook at home, eat more fresh fruits and vegetables, and eat less fast food and canned, processed, or prepared foods. This information is not intended to replace advice given to you by your health care provider. Make sure you discuss any questions you have with your health care provider. Document Revised: 04/18/2019 Document Reviewed: 02/12/2019 Elsevier Patient Education  2023 [employer].    Important  Information About Sugar

## 2021-08-17 NOTE — Progress Notes (Unsigned)
Zio XT applied in office  Dr Irish Lack to read

## 2021-09-09 ENCOUNTER — Other Ambulatory Visit: Payer: Medicare Other

## 2021-09-10 ENCOUNTER — Telehealth: Payer: Self-pay | Admitting: Physician Assistant

## 2021-09-10 DIAGNOSIS — I4821 Permanent atrial fibrillation: Secondary | ICD-10-CM

## 2021-09-10 NOTE — Telephone Encounter (Signed)
   Cardiac Monitor Alert  Date of alert:  09/10/2021   Patient Name: Jonathon Stevens  DOB: Jan 02, 1946  MRN: [id number]   [place] Cardiologist: Larae Grooms, MD  [facility] EP:  None    Monitor Information: Long Term Monitor [ZioXT]  Reason:  Ordered to assess Afib burden Ordering provider:  Ambrose Pancoast, NP   Alert  2 Ventricular Tachycardia runs occurred, the run with the fastest interval lasting 8.8 secs with a max rate of 167 bpm (avg 154 bpm); the run with the fastest interval was also the longest. Atrial Fibrillation/Flutter occurred continuously (100% burden), ranging from 26-183 bpm (avg of 87 bpm). 1741 Pauses occurred, the longest lasting 10.5 secs (6 bpm). Isolated VEs were rare (<1.0%, 10597), VE Couplets were rare (<1.0%, 104), and VE Triplets were rare (<1.0%, 12). Ventricular Trigeminy was present. MD notification criteria for Slow Atrial Fibrillation/Atrial Flutter met and Pauses met - report posted prior to notification per account request (AG).  Other: Reviewed Zio Suite report with Dr. Lovena Le, on call with EP, including VT (22 beats), pauses, and SVR. The report only shows [place] the longest pauses/slowest rate, the ones with duration in the 10.5 sec range, which occurred 5/27, 5/28, and 5/31. These were all during sleeping hours, confirmed with the patient, who denies any specific symptoms at these times. The graph outlines that there were daytime pauses as well but does not give [place] specifics other than the range 1741 pauses between 3-10.5 sec. The episodes do appear bell curve clustered around sleeping hours. The monitor was done to assess Afib/flutter burden. He denies any near-syncope or syncope. Sometimes in the morning around breakfast time he will feel 2 seconds of lightheadedness associated with a hot flash. This has not gotten any worse recently. Otherwise his main complaint has been exertional dyspnea ever since having Covid, well described in recent visits.  Per discussion with Dr. Lovena Le, he recommends stopping the metoprolol completely and arranging outpatient EP consultation, with ER precautions relayed to the patient. I will send a message to our triage team to communicate with EP scheduler on Monday when office reopens. He verbalized understanding and gratitude.   Will also CC to ordering provider Ambrose Pancoast and primary cardiologist for review and additional advisement in the interim if needed.  Charlie Pitter, PA-C  09/10/2021 9:40 AM

## 2021-09-12 NOTE — Telephone Encounter (Signed)
Referral to EP per Dr. Ladona Ridgel and Ronie Spies. Scheduler to call patient to make an appointment.

## 2021-09-15 ENCOUNTER — Institutional Professional Consult (permissible substitution): Payer: Medicare Other | Admitting: Internal Medicine

## 2021-09-23 ENCOUNTER — Ambulatory Visit: Payer: Medicare Other | Admitting: Internal Medicine

## 2021-09-23 ENCOUNTER — Encounter: Payer: Self-pay | Admitting: Internal Medicine

## 2021-09-23 VITALS — BP 140/82 | HR 104 | Ht 70.0 in | Wt 185.0 lb

## 2021-09-23 DIAGNOSIS — I4891 Unspecified atrial fibrillation: Secondary | ICD-10-CM

## 2021-09-23 NOTE — Patient Instructions (Addendum)
Medication Instructions:  Your physician recommends that you continue on your current medications as directed. Please refer to the Current Medication list given to you today.  Labwork: None ordered.  Testing/Procedures: PLEASE SCHEDULE CHEST CT.  Follow-Up:  Ventura Sellers will schedule the Tikosyn Admission.  AFIB CLINIC INFORMATION: Your appointment is scheduled on:      at  The AFib Clinic is located in the Heart and Vascular Specialty Clinics at [facility]. Parking instructions/directions: Government social research officer C ([address]). When you pull in to Entrance C,  there is an underground parking garage to your right. The code to enter the garage is  . Take the  elevators to the first floor. Follow the signs to the Heart and Vascular Specialty Clinics. You will see  registration at the end of the hallway.  Phone number: [phone]    Any Other Special Instructions Will Be Listed Below (If Applicable).  If you need a refill on your cardiac medications before your next appointment, please call your pharmacy.   Tikosyn (Dofetilide) Hospital Admission  Prior to day of admission: Check with drug insurance company for cost of drug to ensure affordability --- Dofetilide 500 mcg twice a day.  GoodRx is an option if insurance copay is unaffordable.  All patients are tested for COVID-19 prior to admission.  No Benadryl is allowed 3 days prior to admission.  Please ensure no missed doses of your anticoagulation (blood thinner) for 3 weeks prior to admission. If a dose is missed please notify our office immediately.  A pharmacist will review all your medications for potential interactions with Tikosyn. If any medication changes are needed prior to admission we will be in touch with you.  If any new medications are started AFTER your admission date is set with Radio producer. Please notify our office immediately so your medication list can be updated and reviewed by our pharmacist again. On  day of admission: Tikosyn initiation requires a 3 night/4 day hospital stay with constant telemetry monitoring. You will have an EKG after each dose of Tikosyn as well as daily lab draws.  If the drug does not convert you to normal rhythm a cardioversion after the 4th dose of Tikosyn.  Afib Clinic office visit on the morning of admission is needed for preliminary labs/ekg.  Time of admission is dependent on bed availability in the hospital. In some instances, you will be sent home until bed is available. Rarely admission can be delayed to the following day if hospital census prevents available beds.  You may bring personal belongings/clothing with you to the hospital. Please leave your suitcase in the car until you arrive in admissions.  Questions please call our office at [phone]

## 2021-09-23 NOTE — Progress Notes (Signed)
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 His atrial fib goes back about 5 years when he had a stroke. He has had persistent atrial fib. He has worn a cardiac monitor while on a beta blocker which demonstrated long pauses (nocturnal) as well as RVR with rates over 160/min. He does not have palpitations but note episodic fatigue and weakness. No chest pain. No edema. No syncope.  Allergies  Allergen Reactions   Cyclobenzaprine Nausea Only   Indomethacin Rash     Current Outpatient Medications  Medication Sig Dispense Refill   acetaminophen (TYLENOL) 650 MG CR tablet Take 1,300 mg by mouth every 8 (eight) hours as needed for pain.     allopurinol (ZYLOPRIM) 300 MG tablet Take 300 mg by mouth daily after breakfast.      atorvastatin (LIPITOR) 10 MG tablet TAKE 1 TABLET BY MOUTH DAILY AT 6 PM 90 tablet 2   blood glucose meter kit and supplies KIT Dispense based on patient and insurance preference. Use up to four times daily as directed. (FOR ICD-9 250.00, 250.01). 1 each 0   COENZYME Q10 PO Take 1 tablet by mouth daily.      GLUCOSAMINE-CHONDROITIN PO Take 1 tablet by mouth daily.      Multiple Vitamin (MULTIVITAMIN) tablet Take 1 tablet by mouth daily.     Omega-3 Fatty Acids (FISH OIL PO) Take 2 tablets by mouth daily.     pantoprazole (PROTONIX) 40 MG tablet Take 1 tablet (40 mg total) by mouth daily. 30 tablet 1   PROLIA 60 MG/ML SOSY injection Inject 60 mg into the skin every 6 (six) months.     rivaroxaban (XARELTO) 20 MG TABS tablet TAKE 1 TABLET(20 MG) BY MOUTH DAILY 90 tablet 1   No current facility-administered medications for this visit.     Past Medical History:  Diagnosis Date   Atrial flutter ([place])    CVA (cerebral infarction)    embolic CVA 05/8754   Diabetes mellitus without complication (HCC)    Gout    HLD (hyperlipidemia)    Hx of echocardiogram    a. ECHO 10/08/13: EF 45-50%, mild hypokinesis of apical  myocardium. Mild LA dilation, mild RA dilation;  b.  TEE (10/09/13):  EF 50-55%, normal wall motion, no LAA clot   Hypertension    Stroke ([place])     ROS:   All systems reviewed and negative except as noted in the HPI.   Past Surgical History:  Procedure Laterality Date   ESOPHAGOGASTRODUODENOSCOPY (EGD) WITH PROPOFOL N/A 02/14/2021   Procedure: ESOPHAGOGASTRODUODENOSCOPY (EGD) WITH PROPOFOL;  Surgeon: Carol Ada, MD;  Location: [place];  Service: Endoscopy;  Laterality: N/A;   HEMOSTASIS CLIP PLACEMENT  02/14/2021   Procedure: HEMOSTASIS CLIP PLACEMENT;  Surgeon: Carol Ada, MD;  Location: [place];  Service: Endoscopy;;   TEE WITHOUT CARDIOVERSION N/A 10/09/2013   Procedure: TRANSESOPHAGEAL ECHOCARDIOGRAM (TEE);  Surgeon: Pixie Casino, MD;  Location[facility] ENDOSCOPY;  Service: Cardiovascular;  Laterality: N/A;     Family History  Problem Relation Age of Onset   Hypertension Mother    Stroke Mother    Hypertension Father    Heart attack Neg Hx      Social History   Socioeconomic History   Marital status: Unknown    Spouse name: Jonathon Stevens   Number of children: 2   Years of education: college   Highest education level: Not on file  Occupational History  Occupation: retired   Tobacco Use   Smoking status: Former    Packs/day: 0.50    Years: 15.00    Total pack years: 7.50    Types: Cigarettes    Quit date: 07/13/2015    Years since quitting: 6.2    Passive exposure: Never   Smokeless tobacco: Never  Vaping Use   Vaping Use: Never used  Substance and Sexual Activity   Alcohol use: No    Alcohol/week: 0.0 standard drinks of alcohol   Drug use: No   Sexual activity: Yes  Other Topics Concern   Not on file  Social History Narrative   Patient is married lives with Spouse Jonathon Stevens).   Patient is right handed.   Patient has a Gaffer   Patient drinks 1 cup of coffee daily   Social Determinants of Health   Financial Resource Strain: Not on file   Food Insecurity: Not on file  Transportation Needs: Not on file  Physical Activity: Not on file  Stress: Not on file  Social Connections: Not on file  Intimate Partner Violence: Not on file     BP 140/82   Stevens (!) 104   Ht 5' 10" (1.778 m)   Wt 185 lb (83.9 kg)   SpO2 97%   BMI 26.54 kg/m   Physical Exam:  Well appearing NAD HEENT: Unremarkable Neck:  No JVD, no thyromegally Lymphatics:  No adenopathy Back:  No CVA tenderness Lungs:  Clear with no wheezes HEART:  Regular rate rhythm, no murmurs, no rubs, no clicks Abd:  soft, positive bowel sounds, no organomegally, no rebound, no guarding Ext:  2 plus pulses, no edema, no cyanosis, no clubbing Skin:  No rashes no nodules Neuro:  CN II through XII intact, motor grossly intact  EKG - atrial fib with a CVR  Cardiac monitor - reviewed. AFib with a RVR/CVR/SVR.  Assess/Plan:  Persistent atrial fib- I have discussed the treatment optiosn with the patient and recommended either dofetilide vs AV node RFA and PPM insertion. The risks/benefits/of each approach were reviewed and he would like to proceed with dofetilide. Long pauses - I suspect that he will eventualy require back up pacing. We will follow. Jonathon Overlie Mekhia Brogan,MD

## 2021-09-26 ENCOUNTER — Ambulatory Visit (HOSPITAL_BASED_OUTPATIENT_CLINIC_OR_DEPARTMENT_OTHER)
Admission: RE | Admit: 2021-09-26 | Discharge: 2021-09-26 | Disposition: A | Discharge status: Home / Self Care | Payer: Medicare Other | Source: Ambulatory Visit | Attending: Nurse Practitioner | Admitting: Nurse Practitioner

## 2021-09-26 DIAGNOSIS — J84112 Idiopathic pulmonary fibrosis: Secondary | ICD-10-CM | POA: Diagnosis present

## 2021-09-26 DIAGNOSIS — J441 Chronic obstructive pulmonary disease with (acute) exacerbation: Secondary | ICD-10-CM | POA: Insufficient documentation

## 2021-09-26 DIAGNOSIS — E785 Hyperlipidemia, unspecified: Secondary | ICD-10-CM | POA: Insufficient documentation

## 2021-09-26 DIAGNOSIS — I480 Paroxysmal atrial fibrillation: Secondary | ICD-10-CM | POA: Diagnosis present

## 2021-09-26 DIAGNOSIS — Z8673 Personal history of transient ischemic attack (TIA), and cerebral infarction without residual deficits: Secondary | ICD-10-CM | POA: Insufficient documentation

## 2021-09-28 ENCOUNTER — Telehealth: Payer: Self-pay | Admitting: Pharmacist

## 2021-09-28 NOTE — Telephone Encounter (Signed)
Medication list reviewed in anticipation of upcoming Tikosyn initiation. Patient is not taking any contraindicated or QTc prolonging medications.   Patient is anticoagulated on Xarelto 20mg daily on the appropriate dose. Please ensure that patient has not missed any anticoagulation doses in the 3 weeks prior to Tikosyn initiation.   Patient will need to be counseled to avoid use of Benadryl while on Tikosyn and in the 2-3 days prior to Tikosyn initiation.  

## 2021-10-01 ENCOUNTER — Other Ambulatory Visit: Payer: Self-pay | Admitting: Interventional Cardiology

## 2021-10-03 ENCOUNTER — Other Ambulatory Visit: Payer: Self-pay

## 2021-10-03 MED ORDER — ATORVASTATIN CALCIUM 10 MG PO TABS: 10.0000 mg | ORAL_TABLET | Freq: Every day | ORAL | 3 refills | Status: DC

## 2021-10-05 ENCOUNTER — Other Ambulatory Visit: Payer: Self-pay | Admitting: Interventional Cardiology

## 2021-10-05 NOTE — Telephone Encounter (Signed)
Pt last saw Dr Ladona Ridgel 09/23/21, last labs 08/17/21 Creat 1.42, age 76, weight 83.9kg, CrCl 52.52, based on CrCl pt is on appropriate dosage of Xarelto 20mg  QD for afib.  Will refill rx.

## 2021-10-11 ENCOUNTER — Other Ambulatory Visit: Payer: Self-pay

## 2021-10-11 ENCOUNTER — Encounter (HOSPITAL_COMMUNITY): Payer: Self-pay | Admitting: Internal Medicine

## 2021-10-11 ENCOUNTER — Inpatient Hospital Stay (HOSPITAL_COMMUNITY)
Admission: AD | Admit: 2021-10-11 | Discharge: 2021-10-14 | DRG: 309 | Disposition: A | Discharge status: Home / Self Care | Payer: Medicare Other | Source: Ambulatory Visit | Attending: Internal Medicine | Admitting: Internal Medicine

## 2021-10-11 ENCOUNTER — Encounter (HOSPITAL_COMMUNITY): Payer: Self-pay | Admitting: Physician Assistant

## 2021-10-11 ENCOUNTER — Ambulatory Visit (HOSPITAL_COMMUNITY)
Admission: RE | Admit: 2021-10-11 | Discharge: 2021-10-11 | Disposition: A | Discharge status: Home / Self Care | Payer: Medicare Other | Source: Ambulatory Visit | Attending: Physician Assistant | Admitting: Physician Assistant

## 2021-10-11 VITALS — BP 148/88 | HR 94 | Ht 70.0 in | Wt 189.8 lb

## 2021-10-11 DIAGNOSIS — Z823 Family history of stroke: Secondary | ICD-10-CM

## 2021-10-11 DIAGNOSIS — Z79899 Other long term (current) drug therapy: Secondary | ICD-10-CM | POA: Diagnosis not present

## 2021-10-11 DIAGNOSIS — Z87891 Personal history of nicotine dependence: Secondary | ICD-10-CM

## 2021-10-11 DIAGNOSIS — Z7901 Long term (current) use of anticoagulants: Secondary | ICD-10-CM | POA: Diagnosis not present

## 2021-10-11 DIAGNOSIS — J8410 Pulmonary fibrosis, unspecified: Secondary | ICD-10-CM | POA: Diagnosis present

## 2021-10-11 DIAGNOSIS — E785 Hyperlipidemia, unspecified: Secondary | ICD-10-CM | POA: Diagnosis present

## 2021-10-11 DIAGNOSIS — R001 Bradycardia, unspecified: Secondary | ICD-10-CM | POA: Diagnosis present

## 2021-10-11 DIAGNOSIS — M109 Gout, unspecified: Secondary | ICD-10-CM | POA: Diagnosis present

## 2021-10-11 DIAGNOSIS — I4819 Other persistent atrial fibrillation: Secondary | ICD-10-CM

## 2021-10-11 DIAGNOSIS — I714 Abdominal aortic aneurysm, without rupture, unspecified: Secondary | ICD-10-CM | POA: Diagnosis present

## 2021-10-11 DIAGNOSIS — J449 Chronic obstructive pulmonary disease, unspecified: Secondary | ICD-10-CM | POA: Diagnosis present

## 2021-10-11 DIAGNOSIS — I4892 Unspecified atrial flutter: Secondary | ICD-10-CM | POA: Diagnosis present

## 2021-10-11 DIAGNOSIS — E119 Type 2 diabetes mellitus without complications: Secondary | ICD-10-CM | POA: Diagnosis not present

## 2021-10-11 DIAGNOSIS — I4891 Unspecified atrial fibrillation: Secondary | ICD-10-CM | POA: Diagnosis not present

## 2021-10-11 DIAGNOSIS — I10 Essential (primary) hypertension: Secondary | ICD-10-CM | POA: Diagnosis not present

## 2021-10-11 DIAGNOSIS — Z8673 Personal history of transient ischemic attack (TIA), and cerebral infarction without residual deficits: Secondary | ICD-10-CM | POA: Diagnosis not present

## 2021-10-11 DIAGNOSIS — D6869 Other thrombophilia: Secondary | ICD-10-CM | POA: Insufficient documentation

## 2021-10-11 DIAGNOSIS — Z888 Allergy status to other drugs, medicaments and biological substances status: Secondary | ICD-10-CM

## 2021-10-11 DIAGNOSIS — Z8249 Family history of ischemic heart disease and other diseases of the circulatory system: Secondary | ICD-10-CM | POA: Diagnosis not present

## 2021-10-11 DIAGNOSIS — E1122 Type 2 diabetes mellitus with diabetic chronic kidney disease: Secondary | ICD-10-CM | POA: Diagnosis present

## 2021-10-11 DIAGNOSIS — I723 Aneurysm of iliac artery: Secondary | ICD-10-CM | POA: Diagnosis present

## 2021-10-11 DIAGNOSIS — N1831 Chronic kidney disease, stage 3a: Secondary | ICD-10-CM | POA: Diagnosis present

## 2021-10-11 DIAGNOSIS — I129 Hypertensive chronic kidney disease with stage 1 through stage 4 chronic kidney disease, or unspecified chronic kidney disease: Secondary | ICD-10-CM | POA: Diagnosis present

## 2021-10-11 DIAGNOSIS — I2510 Atherosclerotic heart disease of native coronary artery without angina pectoris: Secondary | ICD-10-CM | POA: Diagnosis present

## 2021-10-11 LAB — BASIC METABOLIC PANEL
Anion gap: 9 (ref 5–15)
BUN: 19 mg/dL (ref 8–23)
CO2: 21 mmol/L — ABNORMAL LOW (ref 22–32)
Calcium: 9.4 mg/dL (ref 8.9–10.3)
Chloride: 108 mmol/L (ref 98–111)
Creatinine, Ser: 1.26 mg/dL — ABNORMAL HIGH (ref 0.61–1.24)
GFR, Estimated: 59 mL/min — ABNORMAL LOW (ref >60)
Glucose, Bld: 152 mg/dL — ABNORMAL HIGH (ref 70–99)
Potassium: 4.3 mmol/L (ref 3.5–5.1)
Sodium: 138 mmol/L (ref 135–145)

## 2021-10-11 LAB — MAGNESIUM: Magnesium: 2.1 mg/dL (ref 1.7–2.4)

## 2021-10-11 MED ORDER — SODIUM CHLORIDE 0.9% FLUSH
3.0000 mL | Freq: Two times a day (BID) | INTRAVENOUS | Status: DC
  Administered 2021-10-12 – 2021-10-14 (×5): 3 mL via INTRAVENOUS

## 2021-10-11 MED ORDER — ALLOPURINOL 300 MG PO TABS
150.0000 mg | ORAL_TABLET | Freq: Every day | ORAL | Status: DC
  Administered 2021-10-13 – 2021-10-14 (×2): 150 mg via ORAL
  Filled 2021-10-11 (×2): qty 1

## 2021-10-11 MED ORDER — SODIUM CHLORIDE 0.9 % IV SOLN: 250.0000 mL | INTRAVENOUS | Status: DC | PRN

## 2021-10-11 MED ORDER — PANTOPRAZOLE SODIUM 40 MG PO TBEC
40.0000 mg | DELAYED_RELEASE_TABLET | Freq: Every day | ORAL | Status: DC
  Administered 2021-10-12 – 2021-10-14 (×3): 40 mg via ORAL
  Filled 2021-10-11 (×3): qty 1

## 2021-10-11 MED ORDER — ATORVASTATIN CALCIUM 10 MG PO TABS
10.0000 mg | ORAL_TABLET | Freq: Every day | ORAL | Status: DC
  Administered 2021-10-12 – 2021-10-14 (×3): 10 mg via ORAL
  Filled 2021-10-11 (×3): qty 1

## 2021-10-11 MED ORDER — ACETAMINOPHEN 325 MG PO TABS: 975.0000 mg | ORAL_TABLET | Freq: Three times a day (TID) | ORAL | Status: DC | PRN

## 2021-10-11 MED ORDER — COENZYME Q10 10 MG PO CAPS: 10.0000 mg | ORAL_CAPSULE | Freq: Every day | ORAL | Status: DC

## 2021-10-11 MED ORDER — MAGNESIUM 250 MG PO TABS: 250.0000 mg | ORAL_TABLET | Freq: Every day | ORAL | Status: DC

## 2021-10-11 MED ORDER — DOFETILIDE 250 MCG PO CAPS
250.0000 ug | ORAL_CAPSULE | Freq: Two times a day (BID) | ORAL | Status: DC
  Administered 2021-10-11 – 2021-10-14 (×6): 250 ug via ORAL
  Filled 2021-10-11 (×6): qty 1

## 2021-10-11 MED ORDER — MAGNESIUM OXIDE -MG SUPPLEMENT 400 (240 MG) MG PO TABS
400.0000 mg | ORAL_TABLET | Freq: Every day | ORAL | Status: DC
  Administered 2021-10-11 – 2021-10-14 (×4): 400 mg via ORAL
  Filled 2021-10-11 (×4): qty 1

## 2021-10-11 MED ORDER — RIVAROXABAN 20 MG PO TABS: 20.0000 mg | ORAL_TABLET | Freq: Every day | ORAL | Status: DC

## 2021-10-11 MED ORDER — SODIUM CHLORIDE 0.9% FLUSH: 3.0000 mL | INTRAVENOUS | Status: DC | PRN

## 2021-10-11 NOTE — Progress Notes (Signed)
Primary Care Physician: Christain Sacramento, MD Primary Cardiologist: Dr Irish Lack  Primary Electrophysiologist: Dr Lovena Le Referring Physician: Dr Lovena Le   Jonathon Stevens is a 76 y.o. male with a history of CVA, AAA, right common iliac aneurysm, HLD, pulmonary fibrosis, COPD, CKD, esophageal bleed s/p clipping, atrial flutter, atrial fibrillation who presents for follow up in the [facility]. Patient is on Xarelto for a CHADS2VASC score of 6. He wore a cardiac monitor which showed 100% afib burden but also did have VT (22 beats) and pauses up to 10.5 seconds. He was seen by Dr Lovena Le 09/23/21 who recommended dofetilide.   Patient presents today for dofetilide admission. He is in rate controlled afib with symptoms of fatigue and SOB. He denies any missed doses of anticoagulation in the past 3 weeks.   Today, he denies symptoms of palpitations, chest pain, orthopnea, PND, lower extremity edema, dizziness, presyncope, syncope, snoring, daytime somnolence, bleeding, or neurologic sequela. The patient is tolerating medications without difficulties and is otherwise without complaint today.    Atrial Fibrillation Risk Factors:  he does not have symptoms or diagnosis of sleep apnea. he does not have a history of rheumatic fever.   he has a BMI of Body mass index is 27.23 kg/m.Marland Kitchen Filed Weights   10/11/21 1105  Weight: 86.1 kg    Family History  Problem Relation Age of Onset   Hypertension Mother    Stroke Mother    Hypertension Father    Heart attack Neg Hx      Atrial Fibrillation Management history:  Previous antiarrhythmic drugs: none Previous cardioversions: none Previous ablations: none CHADS2VASC score: 6 Anticoagulation history: Xarelto    Past Medical History:  Diagnosis Date   Atrial flutter ([place])    CVA (cerebral infarction)    embolic CVA 04/3298   Diabetes mellitus without complication (HCC)    Gout    HLD (hyperlipidemia)    Hx of  echocardiogram    a. ECHO 10/08/13: EF 45-50%, mild hypokinesis of apical myocardium. Mild LA dilation, mild RA dilation;  b.  TEE (10/09/13):  EF 50-55%, normal wall motion, no LAA clot   Hypertension    Stroke [facility])    Past Surgical History:  Procedure Laterality Date   ESOPHAGOGASTRODUODENOSCOPY (EGD) WITH PROPOFOL N/A 02/14/2021   Procedure: ESOPHAGOGASTRODUODENOSCOPY (EGD) WITH PROPOFOL;  Surgeon: Carol Ada, MD;  Location: [place];  Service: Endoscopy;  Laterality: N/A;   HEMOSTASIS CLIP PLACEMENT  02/14/2021   Procedure: HEMOSTASIS CLIP PLACEMENT;  Surgeon: Carol Ada, MD;  Location: [place];  Service: Endoscopy;;   TEE WITHOUT CARDIOVERSION N/A 10/09/2013   Procedure: TRANSESOPHAGEAL ECHOCARDIOGRAM (TEE);  Surgeon: Pixie Casino, MD;  Location: [facility] ENDOSCOPY;  Service: Cardiovascular;  Laterality: N/A;    Current Outpatient Medications  Medication Sig Dispense Refill   acetaminophen (TYLENOL) 650 MG CR tablet Take 1,300 mg by mouth every 8 (eight) hours as needed for pain.     allopurinol (ZYLOPRIM) 300 MG tablet Take 300 mg by mouth daily after breakfast.      atorvastatin (LIPITOR) 10 MG tablet Take 1 tablet (10 mg total) by mouth daily. 90 tablet 3   blood glucose meter kit and supplies KIT Dispense based on patient and insurance preference. Use up to four times daily as directed. (FOR ICD-9 250.00, 250.01). 1 each 0   Calcium Carb-Magnesium Carb 80-115 MG TABS Take by mouth.     Cholecalciferol (VITAMIN D3) 125 MCG (5000 UT) CAPS Take by mouth.  COENZYME Q10 PO Take 1 tablet by mouth daily.      GLUCOSAMINE-CHONDROITIN PO Take 1 tablet by mouth daily.      Magnesium 250 MG TABS Take by mouth daily.     Multiple Vitamin (MULTIVITAMIN) tablet Take 1 tablet by mouth daily.     Omega-3 Fatty Acids (FISH OIL PO) Take 2 tablets by mouth daily.     pantoprazole (PROTONIX) 40 MG tablet Take 1 tablet (40 mg total) by mouth daily. 30 tablet 1   PROLIA 60 MG/ML SOSY  injection Inject 60 mg into the skin every 6 (six) months.     Turmeric 400 MG CAPS Take by mouth.     XARELTO 20 MG TABS tablet TAKE 1 TABLET(20 MG) BY MOUTH DAILY 90 tablet 1   No current facility-administered medications for this encounter.    Allergies  Allergen Reactions   Cyclobenzaprine Nausea Only   Indomethacin Rash    Social History   Socioeconomic History   Marital status: Unknown    Spouse name: [name]   Number of children: 2   Years of education: college   Highest education level: Not on file  Occupational History   Occupation: retired   Tobacco Use   Smoking status: Former    Packs/day: 0.50    Years: 15.00    Total pack years: 7.50    Types: Cigarettes    Quit date: 07/13/2015    Years since quitting: 6.2    Passive exposure: Never   Smokeless tobacco: Never   Tobacco comments:    Former smoker 10/11/21  Vaping Use   Vaping Use: Never used  Substance and Sexual Activity   Alcohol use: No    Alcohol/week: 0.0 standard drinks of alcohol   Drug use: No   Sexual activity: Yes  Other Topics Concern   Not on file  Social History Narrative   Patient is married lives with Spouse [name]).   Patient is right handed.   Patient has a Secretary/administrator Degree   Patient drinks 1 cup of coffee daily   Social Determinants of Health   Financial Resource Strain: Not on file  Food Insecurity: Not on file  Transportation Needs: Not on file  Physical Activity: Not on file  Stress: Not on file  Social Connections: Not on file  Intimate Partner Violence: Not on file     ROS- All systems are reviewed and negative except as per the HPI above.  Physical Exam: Vitals:   10/11/21 1105  BP: (!) 148/88  Pulse: 94  Weight: 86.1 kg  Height: '5\' 10"'  (1.778 m)    GEN- The patient is a well appearing elderly male, alert and oriented x 3 today.   Head- normocephalic, atraumatic Eyes-  Sclera clear, conjunctiva pink Ears- hearing intact Oropharynx- clear Neck- supple   Lungs- Clear to ausculation bilaterally, normal work of breathing Heart- irregular rate and rhythm, no murmurs, rubs or gallops  GI- soft, NT, ND, + BS Extremities- no clubbing, cyanosis, or edema MS- no significant deformity or atrophy Skin- no rash or lesion Psych- euthymic mood, full affect Neuro- strength and sensation are intact  Wt Readings from Last 3 Encounters:  10/11/21 86.1 kg  09/23/21 83.9 kg  08/17/21 85.7 kg    EKG today demonstrates  Coarse Afib Vent. rate 94 BPM PR interval * ms QRS duration 82 ms QT/QTcB 366/457 ms  Echo 02/13/21 demonstrated   1. Left ventricular ejection fraction, by estimation, is 70 to 75%. The  left  ventricle has hyperdynamic function. The left ventricle has no  regional wall motion abnormalities. There is mild left ventricular  hypertrophy. Left ventricular diastolic function could not be evaluated.   2. Right ventricular systolic function is normal. The right ventricular  size is normal.   3. Left atrial size was moderately dilated.   4. The mitral valve is normal in structure. Mild mitral valve  regurgitation. No evidence of mitral stenosis.   5. The aortic valve is tricuspid. Aortic valve regurgitation is not  visualized. Aortic valve sclerosis is present, with no evidence of aortic valve stenosis.   6. The inferior vena cava is normal in size with greater than 50%  respiratory variability, suggesting right atrial pressure of 3 mmHg.   Epic records are reviewed at length today  CHA2DS2-VASc Score = 6  The patient's score is based upon: CHF History: 0 HTN History: 1 Diabetes History: 0 Stroke History: 2 Vascular Disease History: 1 Age Score: 2 Gender Score: 0       ASSESSMENT AND PLAN: 1. Persistent Atrial Fibrillation/atrial flutter The patient's CHA2DS2-VASc score is 6, indicating a 9.7% annual risk of stroke.   Patient presents today for dofetilide admission.  Continue Xarelto 20 mg daily, states no missed doses in  the last 3 weeks. No recent benadryl use PharmD has screened medications Labs today show creatinine at 1.26, K+ 4.3 and mag 2.1, CrCl calculated at 60 mL/min Off AV nodal agents with h/o significant bradycardia/pauses.   2. Secondary Hypercoagulable State (ICD10:  D68.69) The patient is at significant risk for stroke/thromboembolism based upon his CHA2DS2-VASc Score of 6.  Continue Rivaroxaban (Xarelto).   3. HTN Stable, no changes today.  4. CAD On statin No anginal symptoms.   To be admitted later today once a bed becomes available.    [facility] [address] [place], [place] [address] [phone] 10/11/2021 11:39 AM

## 2021-10-11 NOTE — Progress Notes (Signed)
Post Tikosyn Qtc 469. Pt remains in Afib. Will continue to monitor.

## 2021-10-11 NOTE — H&P (Signed)
Cardiology Admission History and Physical:   Patient ID: Jonathon Stevens MRN: [id number]; DOB: 09-Feb-1946   Admission date: 10/11/2021  PCP:  Christain Sacramento, MD   [facility] Providers Cardiologist:  Larae Grooms, MD        Chief Complaint:  Jonathon Stevens  Patient Profile:   Jonathon Stevens is a 76 y.o. male with a history of CVA, AAA, right common iliac aneurysm, HLD, pulmonary fibrosis, COPD, CKD, esophageal bleed s/p clipping, atrial flutter, atrial fibrillation who presents for tikosyn load  History of Present Illness:   Per HPI in afib clinic,  Patient is on Xarelto for a CHADS2VASC score of 6. He wore a cardiac monitor which showed 100% afib burden but also did have VT (22 beats) and pauses up to 10.5 seconds. He was seen by Dr Lovena Le 09/23/21 who recommended dofetilide.    Patient presents today for dofetilide admission. He is in rate controlled afib with symptoms of fatigue and SOB. He denies any missed doses of anticoagulation in the past 3 weeks.    Today, he denies symptoms of palpitations, chest pain, orthopnea, PND, lower extremity edema, dizziness, presyncope, syncope, snoring, daytime somnolence, bleeding, or neurologic sequela. The patient is tolerating medications without difficulties and is otherwise without complaint today.   At the bedside he states that he has low energy. Otherwise no significant cardiac symptoms.   Past Medical History:  Diagnosis Date   Atrial flutter ([place])    CVA (cerebral infarction)    embolic CVA 03/7614   Diabetes mellitus without complication (HCC)    Gout    HLD (hyperlipidemia)    Hx of echocardiogram    a. ECHO 10/08/13: EF 45-50%, mild hypokinesis of apical myocardium. Mild LA dilation, mild RA dilation;  b.  TEE (10/09/13):  EF 50-55%, normal wall motion, no LAA clot   Hypertension    Stroke [facility])     Past Surgical History:  Procedure Laterality Date   ESOPHAGOGASTRODUODENOSCOPY (EGD) WITH PROPOFOL N/A 02/14/2021    Procedure: ESOPHAGOGASTRODUODENOSCOPY (EGD) WITH PROPOFOL;  Surgeon: Carol Ada, MD;  Location: [facility];  Service: Endoscopy;  Laterality: N/A;   HEMOSTASIS CLIP PLACEMENT  02/14/2021   Procedure: HEMOSTASIS CLIP PLACEMENT;  Surgeon: Carol Ada, MD;  Location: [place];  Service: Endoscopy;;   TEE WITHOUT CARDIOVERSION N/A 10/09/2013   Procedure: TRANSESOPHAGEAL ECHOCARDIOGRAM (TEE);  Surgeon: Pixie Casino, MD;  Location: [facility] ENDOSCOPY;  Service: Cardiovascular;  Laterality: N/A;     Medications Prior to Admission: Prior to Admission medications   Medication Sig Start Date End Date Taking? Authorizing Provider  acetaminophen (TYLENOL) 650 MG CR tablet Take 1,300 mg by mouth every 8 (eight) hours as needed for pain.    [provider]  allopurinol (ZYLOPRIM) 300 MG tablet Take 300 mg by mouth daily after breakfast.  04/26/16   [provider]  atorvastatin (LIPITOR) 10 MG tablet Take 1 tablet (10 mg total) by mouth daily. 10/03/21   Marylu Lund., NP  blood glucose meter kit and supplies KIT Dispense based on patient and insurance preference. Use up to four times daily as directed. (FOR ICD-9 250.00, 250.01). 04/10/19   Elodia Florence., MD  Calcium Carb-Magnesium Carb 80-115 MG TABS Take by mouth.    [provider]  Cholecalciferol (VITAMIN D3) 125 MCG (5000 UT) CAPS Take by mouth.    [provider]  COENZYME Q10 PO Take 1 tablet by mouth daily.     [provider]  GLUCOSAMINE-CHONDROITIN PO  Take 1 tablet by mouth daily.     [provider]  Magnesium 250 MG TABS Take by mouth daily.    [provider]  Multiple Vitamin (MULTIVITAMIN) tablet Take 1 tablet by mouth daily.    [provider]  Omega-3 Fatty Acids (FISH OIL PO) Take 2 tablets by mouth daily.    [provider]  pantoprazole (PROTONIX) 40 MG tablet Take 1 tablet (40 mg total) by mouth daily. 02/16/21   Corky Sox, MD   PROLIA 60 MG/ML SOSY injection Inject 60 mg into the skin every 6 (six) months. 05/07/20   [provider]  Turmeric 400 MG CAPS Take by mouth.    [provider]  XARELTO 20 MG TABS tablet TAKE 1 TABLET(20 MG) BY MOUTH DAILY 10/05/21   Evans Lance, MD     Allergies:    Allergies  Allergen Reactions   Cyclobenzaprine Nausea Only   Indomethacin Rash    Social History:   Social History   Socioeconomic History   Marital status: Unknown    Spouse name: [name]   Number of children: 2   Years of education: college   Highest education level: Not on file  Occupational History   Occupation: retired   Tobacco Use   Smoking status: Former    Packs/day: 0.50    Years: 15.00    Total pack years: 7.50    Types: Cigarettes    Quit date: 07/13/2015    Years since quitting: 6.2    Passive exposure: Never   Smokeless tobacco: Never   Tobacco comments:    Former smoker 10/11/21  Vaping Use   Vaping Use: Never used  Substance and Sexual Activity   Alcohol use: No    Alcohol/week: 0.0 standard drinks of alcohol   Drug use: No   Sexual activity: Yes  Other Topics Concern   Not on file  Social History Narrative   Patient is married lives with Spouse [name]).   Patient is right handed.   Patient has a Secretary/administrator Degree   Patient drinks 1 cup of coffee daily   Social Determinants of Health   Financial Resource Strain: Not on file  Food Insecurity: Not on file  Transportation Needs: Not on file  Physical Activity: Not on file  Stress: Not on file  Social Connections: Not on file  Intimate Partner Violence: Not on file    Family History:   The patient's family history includes Hypertension in his father and mother; Stroke in his mother. There is no history of Heart attack.    ROS:  Please see the history of present illness.  All other ROS reviewed and negative.     Physical Exam/Data:   Vitals:   10/11/21 1836 10/11/21 1919  BP: (!) 168/83 (!) 160/87   Pulse: (!) 111 93  Resp:  19  Temp: (!) 97.5 F (36.4 C) 97.6 F (36.4 C)  TempSrc: Oral Oral  SpO2: 98% 97%  Weight: 87.1 kg   Height: '5\' 10"'  (1.778 m)    No intake or output data in the 24 hours ending 10/11/21 2006    10/11/2021    6:36 PM 10/11/2021   11:05 AM 09/23/2021   12:15 PM  Last 3 Weights  Weight (lbs) 192 lb 1.6 oz 189 lb 12.8 oz 185 lb  Weight (kg) 87.136 kg 86.093 kg 83.915 kg     Body mass index is 27.56 kg/m.  General:  Well nourished, well developed, in no acute  distress HEENT: normal Neck: no JVD Vascular: No carotid bruits; Distal pulses 2+ bilaterally   Cardiac:  normal S1, S2; irregularly, irregular rhythm no murmur  Lungs:  clear to auscultation bilaterally, no wheezing, rhonchi or rales  Abd: soft, nontender, no hepatomegaly  Ext: no edema Musculoskeletal:  No deformities, BUE and BLE strength normal and equal Skin: warm and dry  Neuro:  CNs 2-12 intact, no focal abnormalities noted Psych:  Normal affect    EKG: rate controlled atrial fibrillation , IRBBB  Relevant CV Studies: TTE 02/13/2021 EF 70-75%, normal RV function Mild MR No other abnormalities  Laboratory Data:  High Sensitivity Troponin:  No results for input(s): "TROPONINIHS" in the last 720 hours.    Chemistry Recent Labs  Lab 10/11/21 1102  NA 138  K 4.3  CL 108  CO2 21*  GLUCOSE 152*  BUN 19  CREATININE 1.26*  CALCIUM 9.4  MG 2.1  GFRNONAA 59*  ANIONGAP 9    No results for input(s): "PROT", "ALBUMIN", "AST", "ALT", "ALKPHOS", "BILITOT" in the last 168 hours. Lipids No results for input(s): "CHOL", "TRIG", "HDL", "LABVLDL", "LDLCALC", "CHOLHDL" in the last 168 hours. HematologyNo results for input(s): "WBC", "RBC", "HGB", "HCT", "MCV", "MCH", "MCHC", "RDW", "PLT" in the last 168 hours. Thyroid No results for input(s): "TSH", "FREET4" in the last 168 hours. BNPNo results for input(s): "BNP", "PROBNP" in the last 168 hours.  DDimer No results for input(s): "DDIMER"  in the last 168 hours.   Radiology/Studies:  No results found.   Assessment and Plan:   1. Persistent Atrial Fibrillation/atrial flutter The patient's CHA2DS2-VASc score is 6, indicating a 9.7% annual risk of stroke.   Patient presents today for dofetilide admission.  Continue Xarelto 20 mg daily, states no missed doses in the last 3 weeks. PharmD has screened medications Labs in clinic show creatinine at 1.26, K+ 4.3 and mag 2.1, CrCl calculated at 60 mL/min Off AV nodal agents with h/o significant bradycardia/pauses.  - planned for tikosyn load; QTc 472 ms   2. Secondary Hypercoagulable State (ICD10:  D68.69) The patient is at significant risk for stroke/thromboembolism based upon his CHA2DS2-VASc Score of 6.  Continue xarelto per above   3. HTN Stable, no changes today.   4. HLD On statin No anginal symptoms.   Risk Assessment/Risk Scores:     Severity of Illness: The appropriate patient status for this patient is INPATIENT. Inpatient status is judged to be reasonable and necessary in order to provide the required intensity of service to ensure the patient's safety. The patient's presenting symptoms, physical exam findings, and initial radiographic and laboratory data in the context of their chronic comorbidities is felt to place them at high risk for further clinical deterioration. Furthermore, it is not anticipated that the patient will be medically stable for discharge from the hospital within 2 midnights of admission.   * I certify that at the point of admission it is my clinical judgment that the patient will require inpatient hospital care spanning beyond 2 midnights from the point of admission due to high intensity of service, high risk for further deterioration and high frequency of surveillance required.*   For questions or updates, please contact [place] Please consult [email or web address] for contact info under     Signed, Janina Mayo, MD  10/11/2021 8:06  PM

## 2021-10-11 NOTE — Progress Notes (Signed)
Pharmacy: Dofetilide (Tikosyn) - Initial Consult Assessment and Electrolyte Replacement  Pharmacy consulted to assist in monitoring and replacing electrolytes in this 76 y.o. male admitted on 10/11/2021 undergoing dofetilide initiation. First dofetilide dose: Tikosyn 250 mcg BID to start 7/18 at 2000.  Assessment:  Patient Exclusion Criteria: If any screening criteria checked as "Yes", then  patient  should NOT receive dofetilide until criteria item is corrected.  If "Yes" please indicate correction plan.  YES  NO Patient  Exclusion Criteria Correction Plan   [x]   []   Baseline QTc interval is greater than or equal to 440 msec. IF above YES box checked dofetilide contraindicated unless patient has ICD; then may proceed if QTc 500-550 msec or with known ventricular conduction abnormalities may proceed with QTc 550-600 msec. QTc = 457  Confirmed ok for initiation per EP. Monitor closely.   []   [x]   Patient is known or suspected to have a digoxin level greater than 2 ng/ml: No results found for: "DIGOXIN"     []   [x]   Creatinine clearance less than 20 ml/min (calculated using Cockcroft-Gault, actual body weight and serum creatinine): Estimated Creatinine Clearance: 51.5 mL/min (A) (by C-G formula based on SCr of 1.26 mg/dL (H)).     []   [x]  Patient has received drugs known to prolong the QT intervals within the last 48 hours (phenothiazines, tricyclics or tetracyclic antidepressants, erythromycin, H-1 antihistamines, cisapride, fluoroquinolones, azithromycin). Drugs not listed above may have an, as yet, undetected potential to prolong the QT interval, updated information on QT prolonging agents is available at this website:QT prolonging agents or www.crediblemeds.org    []   [x]   Patient received a dose of hydrochlorothiazide (Oretic) alone or in any combination including triamterene (Dyazide, Maxzide) in the last 48 hours.    []   [x]  Patient received a medication known to increase  dofetilide plasma concentrations prior to initial dofetilide dose:  Trimethoprim (Primsol, Proloprim) in the last 36 hours Verapamil (Calan, Verelan) in the last 36 hours or a sustained release dose in the last 72 hours Megestrol (Megace) in the last 5 days  Cimetidine (Tagamet) in the last 6 hours Ketoconazole (Nizoral) in the last 24 hours Itraconazole (Sporanox) in the last 48 hours  Prochlorperazine (Compazine) in the last 36 hours     []   [x]   Patient is known to have a history of torsades de pointes; congenital or acquired long QT syndromes.    []   [x]   Patient has received a Class 1 antiarrhythmic with less than 2 half-lives since last dose. (Disopyramide, Quinidine, Procainamide, Lidocaine, Mexiletine, Flecainide, Propafenone)    []   [x]   Patient has received amiodarone therapy in the past 3 months or amiodarone level is greater than 0.3 ng/ml.    Patient has been appropriately anticoagulated with Xarelto.  Labs:    Component Value Date/Time   K 4.3 10/11/2021 1102   MG 2.1 10/11/2021 1102     Plan: Potassium: K >/= 4: Appropriate to initiate Tikosyn, no replacement needed    Magnesium: Mg >2: Appropriate to initiate Tikosyn, no replacement needed     Thank you for allowing pharmacy to participate in this patient's care   10/11/2021  7:06 PM

## 2021-10-12 ENCOUNTER — Telehealth (HOSPITAL_COMMUNITY): Payer: Self-pay | Admitting: Pharmacy Technician

## 2021-10-12 ENCOUNTER — Ambulatory Visit: Payer: Medicare Other | Admitting: Interventional Cardiology

## 2021-10-12 ENCOUNTER — Other Ambulatory Visit (HOSPITAL_COMMUNITY): Payer: Self-pay

## 2021-10-12 LAB — BASIC METABOLIC PANEL
Anion gap: 8 (ref 5–15)
BUN: 15 mg/dL (ref 8–23)
CO2: 27 mmol/L (ref 22–32)
Calcium: 9.3 mg/dL (ref 8.9–10.3)
Chloride: 105 mmol/L (ref 98–111)
Creatinine, Ser: 1.38 mg/dL — ABNORMAL HIGH (ref 0.61–1.24)
GFR, Estimated: 53 mL/min — ABNORMAL LOW (ref >60)
Glucose, Bld: 127 mg/dL — ABNORMAL HIGH (ref 70–99)
Potassium: 4.6 mmol/L (ref 3.5–5.1)
Sodium: 140 mmol/L (ref 135–145)

## 2021-10-12 LAB — MAGNESIUM: Magnesium: 2.1 mg/dL (ref 1.7–2.4)

## 2021-10-12 MED ORDER — SODIUM CHLORIDE 0.9 % IV SOLN: INTRAVENOUS | Status: DC

## 2021-10-12 MED ORDER — RIVAROXABAN 20 MG PO TABS
20.0000 mg | ORAL_TABLET | Freq: Every day | ORAL | Status: DC
  Administered 2021-10-12 – 2021-10-14 (×3): 20 mg via ORAL
  Filled 2021-10-12 (×3): qty 1

## 2021-10-12 NOTE — Telephone Encounter (Signed)
Pharmacy Patient Advocate Encounter  Insurance verification completed.    The patient is insured through [employer] Part D   The patient is currently admitted and ran test claims for the following: dofetilide (Tikosyn) 250 mcg capsules..  Copays and coinsurance results were relayed to Inpatient clinical team.

## 2021-10-12 NOTE — H&P (View-Only) (Signed)
 Progress Note  Patient Name: Jonathon Stevens Date of Encounter: 10/12/2021  [facility] Cardiologist: Jayadeep Varanasi, MD   Subjective   No complaints or concerns  Inpatient Medications    Scheduled Meds:  [START ON 10/13/2021] allopurinol  150 mg Oral QPC breakfast   atorvastatin  10 mg Oral Daily   dofetilide  250 mcg Oral BID   magnesium oxide  400 mg Oral Daily   pantoprazole  40 mg Oral Daily   rivaroxaban  20 mg Oral Daily   sodium chloride flush  3 mL Intravenous Q12H   Continuous Infusions:  sodium chloride     PRN Meds: sodium chloride, acetaminophen, sodium chloride flush   Vital Signs    Vitals:   10/11/21 1919 10/12/21 0024 10/12/21 0553 10/12/21 0814  BP: (!) 160/87 (!) 144/85 123/73 (!) 144/93  Pulse: 93 84 71 72  Resp: 19 16 15 16  Temp: 97.6 F (36.4 C) 98.1 F (36.7 C) 98.2 F (36.8 C) 97.6 F (36.4 C)  TempSrc: Oral Oral Oral Oral  SpO2: 97% 97% 97% 96%  Weight:      Height:       No intake or output data in the 24 hours ending 10/12/21 0958    10/11/2021    6:36 PM 10/11/2021   11:05 AM 09/23/2021   12:15 PM  Last 3 Weights  Weight (lbs) 192 lb 1.6 oz 189 lb 12.8 oz 185 lb  Weight (kg) 87.136 kg 86.093 kg 83.915 kg      Telemetry    AFib 80's, a couple nocturnal pauses, longest 4.4 seconds - Personally Reviewed  ECG    AFib 82bpm, QTc 469 - Personally Reviewed  Physical Exam   GEN: No acute distress.   Neck: No JVD Cardiac: irreg-irreg, no murmurs, rubs, or gallops.  Respiratory: CTA b/l. GI: Soft, nontender, non-distended  MS: No edema; No deformity. Neuro:  Nonfocal  Psych: Normal affect   Labs    High Sensitivity Troponin:  No results for input(s): "TROPONINIHS" in the last 720 hours.   Chemistry Recent Labs  Lab 10/11/21 1102 10/12/21 0300  NA 138 140  K 4.3 4.6  CL 108 105  CO2 21* 27  GLUCOSE 152* 127*  BUN 19 15  CREATININE 1.26* 1.38*  CALCIUM 9.4 9.3  MG 2.1 2.1  GFRNONAA 59* 53*  ANIONGAP 9 8     Lipids No results for input(s): "CHOL", "TRIG", "HDL", "LABVLDL", "LDLCALC", "CHOLHDL" in the last 168 hours.  HematologyNo results for input(s): "WBC", "RBC", "HGB", "HCT", "MCV", "MCH", "MCHC", "RDW", "PLT" in the last 168 hours. Thyroid No results for input(s): "TSH", "FREET4" in the last 168 hours.  BNPNo results for input(s): "BNP", "PROBNP" in the last 168 hours.  DDimer No results for input(s): "DDIMER" in the last 168 hours.   Radiology    No results found.  Cardiac Studies   June 2022, monitor Atrial fibrillation, atrial flutter. Average heart rate 87 bpm. Rare PACs and PVCs. 2 episodes of ventricular tachycardia.  02/13/2021: TTE 1. Left ventricular ejection fraction, by estimation, is 70 to 75%. The  left ventricle has hyperdynamic function. The left ventricle has no  regional wall motion abnormalities. There is mild left ventricular  hypertrophy. Left ventricular diastolic  function could not be evaluated.   2. Right ventricular systolic function is normal. The right ventricular  size is normal.   3. Left atrial size was moderately dilated.   4. The mitral valve is normal in structure. Mild   mitral valve  regurgitation. No evidence of mitral stenosis.   5. The aortic valve is tricuspid. Aortic valve regurgitation is not  visualized. Aortic valve sclerosis is present, with no evidence of aortic  valve stenosis.   6. The inferior vena cava is normal in size with greater than 50%  respiratory variability, suggesting right atrial pressure of 3 mmHg.   Patient Profile     76 y.o. male w/PMHx of HTN, HLD, DM, stroke, p.Fibrosis/COPD, CKD (III), esophageal bleed (hx of clipping) Afib admitted for Tikosyn  Dr. Ladona Ridgel saw him 09/23/21, wore a cardiac monitor while on a beta blocker which demonstrated long pauses (nocturnal) as well as RVR with rates over 160/min, (also noted some NSVTs), recommended either dofetilide vs AV node RFA and PPM insertion. The risks/benefits/of  each approach were reviewed and pt preferred to proceed with dofetilide, though suspect down the road, eventually would require pacing support  Assessment & Plan    Persistent Afib CHA2DS2Vasc is 6, on Xarelto, appropriately dosed Tikosyn load is in progress K+ 4.6 Mag 2.1 Creat 1.38, stable QTc stable Took his xarelto at home prior to coming in yesterday, no missed dose  DCCV tomorrow if not in SR, d/w the patient the rational, procedure, potential risks, pt is agreeable   HTN Home meds  For questions or updates, please contact [facility] Please consult [email or web address] for contact info under        Signed, Sheilah Pigeon, PA-C  10/12/2021, 9:58 AM

## 2021-10-12 NOTE — Progress Notes (Signed)
Progress Note  Patient Name: Jonathon Stevens Date of Encounter: 10/12/2021  [facility] Cardiologist: Lance Muss, MD   Subjective   No complaints or concerns  Inpatient Medications    Scheduled Meds:  [START ON 10/13/2021] allopurinol  150 mg Oral QPC breakfast   atorvastatin  10 mg Oral Daily   dofetilide  250 mcg Oral BID   magnesium oxide  400 mg Oral Daily   pantoprazole  40 mg Oral Daily   rivaroxaban  20 mg Oral Daily   sodium chloride flush  3 mL Intravenous Q12H   Continuous Infusions:  sodium chloride     PRN Meds: sodium chloride, acetaminophen, sodium chloride flush   Vital Signs    Vitals:   10/11/21 1919 10/12/21 0024 10/12/21 0553 10/12/21 0814  BP: (!) 160/87 (!) 144/85 123/73 (!) 144/93  Pulse: 93 84 71 72  Resp: 19 16 15 16   Temp: 97.6 F (36.4 C) 98.1 F (36.7 C) 98.2 F (36.8 C) 97.6 F (36.4 C)  TempSrc: Oral Oral Oral Oral  SpO2: 97% 97% 97% 96%  Weight:      Height:       No intake or output data in the 24 hours ending 10/12/21 0958    10/11/2021    6:36 PM 10/11/2021   11:05 AM 09/23/2021   12:15 PM  Last 3 Weights  Weight (lbs) 192 lb 1.6 oz 189 lb 12.8 oz 185 lb  Weight (kg) 87.136 kg 86.093 kg 83.915 kg      Telemetry    AFib 80's, a couple nocturnal pauses, longest 4.4 seconds - Personally Reviewed  ECG    AFib 82bpm, QTc 469 - Personally Reviewed  Physical Exam   GEN: No acute distress.   Neck: No JVD Cardiac: irreg-irreg, no murmurs, rubs, or gallops.  Respiratory: CTA b/l. GI: Soft, nontender, non-distended  MS: No edema; No deformity. Neuro:  Nonfocal  Psych: Normal affect   Labs    High Sensitivity Troponin:  No results for input(s): "TROPONINIHS" in the last 720 hours.   Chemistry Recent Labs  Lab 10/11/21 1102 10/12/21 0300  NA 138 140  K 4.3 4.6  CL 108 105  CO2 21* 27  GLUCOSE 152* 127*  BUN 19 15  CREATININE 1.26* 1.38*  CALCIUM 9.4 9.3  MG 2.1 2.1  GFRNONAA 59* 53*  ANIONGAP 9 8     Lipids No results for input(s): "CHOL", "TRIG", "HDL", "LABVLDL", "LDLCALC", "CHOLHDL" in the last 168 hours.  HematologyNo results for input(s): "WBC", "RBC", "HGB", "HCT", "MCV", "MCH", "MCHC", "RDW", "PLT" in the last 168 hours. Thyroid No results for input(s): "TSH", "FREET4" in the last 168 hours.  BNPNo results for input(s): "BNP", "PROBNP" in the last 168 hours.  DDimer No results for input(s): "DDIMER" in the last 168 hours.   Radiology    No results found.  Cardiac Studies   June 2022, monitor Atrial fibrillation, atrial flutter. Average heart rate 87 bpm. Rare PACs and PVCs. 2 episodes of ventricular tachycardia.  02/13/2021: TTE 1. Left ventricular ejection fraction, by estimation, is 70 to 75%. The  left ventricle has hyperdynamic function. The left ventricle has no  regional wall motion abnormalities. There is mild left ventricular  hypertrophy. Left ventricular diastolic  function could not be evaluated.   2. Right ventricular systolic function is normal. The right ventricular  size is normal.   3. Left atrial size was moderately dilated.   4. The mitral valve is normal in structure. Mild  mitral valve  regurgitation. No evidence of mitral stenosis.   5. The aortic valve is tricuspid. Aortic valve regurgitation is not  visualized. Aortic valve sclerosis is present, with no evidence of aortic  valve stenosis.   6. The inferior vena cava is normal in size with greater than 50%  respiratory variability, suggesting right atrial pressure of 3 mmHg.   Patient Profile     76 y.o. male w/PMHx of HTN, HLD, DM, stroke, p.Fibrosis/COPD, CKD (III), esophageal bleed (hx of clipping) Afib admitted for Tikosyn  Dr. Ladona Ridgel saw him 09/23/21, wore a cardiac monitor while on a beta blocker which demonstrated long pauses (nocturnal) as well as RVR with rates over 160/min, (also noted some NSVTs), recommended either dofetilide vs AV node RFA and PPM insertion. The risks/benefits/of  each approach were reviewed and pt preferred to proceed with dofetilide, though suspect down the road, eventually would require pacing support  Assessment & Plan    Persistent Afib CHA2DS2Vasc is 6, on Xarelto, appropriately dosed Tikosyn load is in progress K+ 4.6 Mag 2.1 Creat 1.38, stable QTc stable Took his xarelto at home prior to coming in yesterday, no missed dose  DCCV tomorrow if not in SR, d/w the patient the rational, procedure, potential risks, pt is agreeable   HTN Home meds  For questions or updates, please contact [facility] Please consult [email or web address] for contact info under        Signed, Sheilah Pigeon, PA-C  10/12/2021, 9:58 AM

## 2021-10-12 NOTE — Progress Notes (Signed)
Pharmacy: Dofetilide (Tikosyn) - Follow Up Assessment and Electrolyte Replacement  Pharmacy consulted to assist in monitoring and replacing electrolytes in this 76 y.o. male admitted on 10/11/2021 undergoing dofetilide initiation. First dofetilide dose: 10/11/21  Labs:    Component Value Date/Time   K 4.6 10/12/2021 0300   MG 2.1 10/12/2021 0300     Plan: Potassium: K >/= 4: No additional supplementation needed  Magnesium: Mg > 2: No additional supplementation needed   Fredonia Highland, PharmD, BCPS, [facility] Clinical Pharmacist [phone] Please check AMION for all [facility] Pharmacy numbers 10/12/2021

## 2021-10-12 NOTE — TOC Benefit Eligibility Note (Signed)
Patient Product/process development scientist completed.    The patient is currently admitted and upon discharge could be taking dofetilide (Tikosyn) 250 mcg capsules.  The current 30 day co-pay is, $12.87.   The patient is insured through [employer] Part [employer]     Roland Earl, CPhT Pharmacy Patient Advocate Specialist [facility] Patient Advocate Team Direct Number: [phone]  Fax: [phone]

## 2021-10-12 NOTE — Care Management (Addendum)
  Transition of Care [facility]) Screening Note   Patient Details  Name: Jonathon Stevens Date of Birth: 1945-08-26   Transition of Care [facility]) CM/SW Contact:    Gala Lewandowsky, RN Phone Number: 10/12/2021, 9:09 AM    Transition of Care Department [facility]) has reviewed the patient. Patient presented for Tikosyn Load. Benefits check submitted for cost. Case Manager will follow for cost and pharmacy of choice.   1308 10-12-21 Patient is agreeable to cost. Patient would like the initial Rx to be filled via [facility] and Rx refills for 90 day supply escribed to [facility].

## 2021-10-13 ENCOUNTER — Inpatient Hospital Stay (HOSPITAL_COMMUNITY): Payer: Medicare Other | Admitting: Anesthesiology

## 2021-10-13 ENCOUNTER — Encounter (HOSPITAL_COMMUNITY)
Admission: AD | Disposition: A | Discharge status: Home / Self Care | Payer: Self-pay | Source: Ambulatory Visit | Attending: Internal Medicine

## 2021-10-13 ENCOUNTER — Encounter (HOSPITAL_COMMUNITY): Payer: Self-pay | Admitting: Internal Medicine

## 2021-10-13 DIAGNOSIS — I10 Essential (primary) hypertension: Secondary | ICD-10-CM

## 2021-10-13 DIAGNOSIS — Z87891 Personal history of nicotine dependence: Secondary | ICD-10-CM

## 2021-10-13 DIAGNOSIS — I4891 Unspecified atrial fibrillation: Secondary | ICD-10-CM

## 2021-10-13 DIAGNOSIS — E119 Type 2 diabetes mellitus without complications: Secondary | ICD-10-CM

## 2021-10-13 HISTORY — PX: CARDIOVERSION: SHX1299

## 2021-10-13 LAB — BASIC METABOLIC PANEL
Anion gap: 8 (ref 5–15)
BUN: 15 mg/dL (ref 8–23)
CO2: 24 mmol/L (ref 22–32)
Calcium: 9.3 mg/dL (ref 8.9–10.3)
Chloride: 107 mmol/L (ref 98–111)
Creatinine, Ser: 1.35 mg/dL — ABNORMAL HIGH (ref 0.61–1.24)
GFR, Estimated: 54 mL/min — ABNORMAL LOW (ref >60)
Glucose, Bld: 131 mg/dL — ABNORMAL HIGH (ref 70–99)
Potassium: 4.3 mmol/L (ref 3.5–5.1)
Sodium: 139 mmol/L (ref 135–145)

## 2021-10-13 LAB — MAGNESIUM: Magnesium: 2.1 mg/dL (ref 1.7–2.4)

## 2021-10-13 SURGERY — CARDIOVERSION
Anesthesia: General

## 2021-10-13 MED ORDER — LIDOCAINE HCL (CARDIAC) PF 100 MG/5ML IV SOSY
PREFILLED_SYRINGE | INTRAVENOUS | Status: DC | PRN
  Administered 2021-10-13: 100 mg via INTRAVENOUS

## 2021-10-13 MED ORDER — PROPOFOL 10 MG/ML IV BOLUS
INTRAVENOUS | Status: DC | PRN
  Administered 2021-10-13: 60 mg via INTRAVENOUS

## 2021-10-13 NOTE — CV Procedure (Signed)
   Electrical Cardioversion Procedure Note Jonathon Stevens [id number] 25-Jun-1945  Procedure: Electrical Cardioversion Indications:  Atrial Fibrillation  Time Out: Verified patient identification, verified procedure,medications/allergies/relevent history reviewed, required imaging and test results available.  Performed  Procedure Details  The patient signed informed consent.   The patient was NPO past midnight. Has had therapeutic anticoagulation with Xeralto greater than 3 weeks. The patient denies any interruption of anticoagulation.  Anesthesia was administered by Dr. Krista Blue.  Adequate airway was maintained throughout and vital followed per protocol.  He was cardioverted x 2 with 200 J of biphasic synchronized energy.  He converted to NSR.  There were no apparent complications.  The patient tolerated the procedure well and had normal neuro status and respiratory status post procedure with vitals stable as recorded elsewhere.     IMPRESSION:  Successful cardioversion of atrial fibrillation to Sinus bradycardia.   Follow up:  We will arrange follow up with Primary Cardiologist.  He will continue on current medical therapy.  The patient advised to continue anticoagulation.  Jonathon Stevens 10/13/2021, 1:45 PM

## 2021-10-13 NOTE — Anesthesia Preprocedure Evaluation (Addendum)
Anesthesia Evaluation  Patient identified by MRN, date of birth, ID band Patient awake    Reviewed: Allergy & Precautions, NPO status , Patient's Chart, lab work & pertinent test results  History of Anesthesia Complications Negative for: history of anesthetic complications  Airway Mallampati: II  TM Distance: >3 FB     Dental  (+) Dental Advisory Given, Edentulous Lower, Edentulous Upper   Pulmonary neg pulmonary ROS, former smoker,    Pulmonary exam normal        Cardiovascular hypertension,  Rhythm:Irregular Rate:Tachycardia  EST .  The study is normal. The study is low risk. .  No ST deviation was noted. .  LV perfusion is normal. There is no evidence of ischemia. There is no evidence of infarction. .  Left ventricular function is normal. Nuclear stress EF: 71 %. The left ventricular ejection fraction is hyperdynamic (>65%). End diastolic cavity size is normal. .  Prior study not available for comparison.    Neuro/Psych CVA    GI/Hepatic negative GI ROS, Neg liver ROS,   Endo/Other  diabetes  Renal/GU negative Renal ROS     Musculoskeletal negative musculoskeletal ROS (+)   Abdominal   Peds  Hematology negative hematology ROS (+)   Anesthesia Other Findings   Reproductive/Obstetrics                            Anesthesia Physical Anesthesia Plan  ASA: 3  Anesthesia Plan: General   Post-op Pain Management: Minimal or no pain anticipated   Induction: Intravenous  PONV Risk Score and Plan: Treatment may vary due to age or medical condition  Airway Management Planned: Mask  Additional Equipment:   Intra-op Plan:   Post-operative Plan:   Informed Consent: I have reviewed the patients History and Physical, chart, labs and discussed the procedure including the risks, benefits and alternatives for the proposed anesthesia with the patient or authorized representative who has  indicated his/her understanding and acceptance.     Dental advisory given  Plan Discussed with: Anesthesiologist and CRNA  Anesthesia Plan Comments:        Anesthesia Quick Evaluation

## 2021-10-13 NOTE — Plan of Care (Signed)

## 2021-10-13 NOTE — Progress Notes (Signed)
Qtc .47

## 2021-10-13 NOTE — Transfer of Care (Signed)
Immediate Anesthesia Transfer of Care Note  Patient: Jonathon Stevens  Procedure(s) Performed: CARDIOVERSION  Patient Location: PACU  Anesthesia Type:General  Level of Consciousness: drowsy and patient cooperative  Airway & Oxygen Therapy: Patient Spontanous Breathing  Post-op Assessment: Report given to RN and Post -op Vital signs reviewed and stable  Post vital signs: Reviewed and stable  Last Vitals:  Vitals Value Taken Time  BP    Temp    Pulse    Resp    SpO2      Last Pain:  Vitals:   10/13/21 1157  TempSrc: Temporal  PainSc: 0-No pain      Patients Stated Pain Goal: 0 (10/12/21 2020)  Complications: No notable events documented.

## 2021-10-13 NOTE — Progress Notes (Signed)
Pharmacy: Dofetilide (Tikosyn) - Follow Up Assessment and Electrolyte Replacement  Pharmacy consulted to assist in monitoring and replacing electrolytes in this 76 y.o. male admitted on 10/11/2021 undergoing dofetilide initiation. First dofetilide dose: 10/11/21  Labs:    Component Value Date/Time   K 4.3 10/13/2021 0220   MG 2.1 10/13/2021 0220     Plan: Potassium: K >/= 4: No additional supplementation needed  Magnesium: Mg > 2: No additional supplementation needed   Andreas Ohm, PharmD Pharmacy Resident  10/13/2021 8:25 AM

## 2021-10-13 NOTE — Anesthesia Postprocedure Evaluation (Signed)
Anesthesia Post Note  Patient: Jonathon Stevens  Procedure(s) Performed: CARDIOVERSION     Patient location during evaluation: Endoscopy Anesthesia Type: General Level of consciousness: sedated Pain management: pain level controlled Vital Signs Assessment: post-procedure vital signs reviewed and stable Respiratory status: spontaneous breathing and respiratory function stable Cardiovascular status: stable Postop Assessment: no apparent nausea or vomiting Anesthetic complications: no   No notable events documented.  Last Vitals:  Vitals:   10/13/21 1309 10/13/21 1320  BP: 109/74 129/67  Pulse: (!) 49 (!) 50  Resp: 16 17  Temp:    SpO2: 94% 97%    Last Pain:  Vitals:   10/13/21 1320  TempSrc:   PainSc: 0-No pain                 Leslye Puccini DANIEL

## 2021-10-13 NOTE — Progress Notes (Signed)
Progress Note  Patient Name: Jonathon Stevens Date of Encounter: 10/13/2021  [facility] Cardiologist: Lance Muss, MD   Subjective   No complaints or concerns  Inpatient Medications    Scheduled Meds:  allopurinol  150 mg Oral QPC breakfast   atorvastatin  10 mg Oral Daily   dofetilide  250 mcg Oral BID   magnesium oxide  400 mg Oral Daily   pantoprazole  40 mg Oral Daily   rivaroxaban  20 mg Oral Daily   sodium chloride flush  3 mL Intravenous Q12H   Continuous Infusions:  sodium chloride     sodium chloride     PRN Meds: sodium chloride, acetaminophen, sodium chloride flush   Vital Signs    Vitals:   10/12/21 1517 10/12/21 1935 10/13/21 0348 10/13/21 0754  BP: (!) 152/83 140/78 132/86 (!) 141/93  Pulse: 80 90 80 72  Resp: 16 15  18   Temp: 97.6 F (36.4 C) (!) 97.4 F (36.3 C) 97.7 F (36.5 C) 97.7 F (36.5 C)  TempSrc: Oral Oral Oral Oral  SpO2: 96% 94% 95% 98%  Weight:      Height:        Intake/Output Summary (Last 24 hours) at 10/13/2021 0808 Last data filed at 10/12/2021 2020 Gross per 24 hour  Intake 150 ml  Output --  Net 150 ml      10/11/2021    6:36 PM 10/11/2021   11:05 AM 09/23/2021   12:15 PM  Last 3 Weights  Weight (lbs) 192 lb 1.6 oz 189 lb 12.8 oz 185 lb  Weight (kg) 87.136 kg 86.093 kg 83.915 kg      Telemetry    AFib 70's-80's - Personally Reviewed  ECG    AFib 78bpm, QTc 492, I get shorter then the machine 479 - Personally Reviewed  Physical Exam   Unchanged exam today GEN: No acute distress.   Neck: No JVD Cardiac: irreg-irreg, no murmurs, rubs, or gallops.  Respiratory: CTA b/l. GI: Soft, nontender, non-distended  MS: No edema; No deformity. Neuro:  Nonfocal  Psych: Normal affect   Labs    High Sensitivity Troponin:  No results for input(s): "TROPONINIHS" in the last 720 hours.   Chemistry Recent Labs  Lab 10/11/21 1102 10/12/21 0300 10/13/21 0220  NA 138 140 139  K 4.3 4.6 4.3  CL 108 105 107   CO2 21* 27 24  GLUCOSE 152* 127* 131*  BUN 19 15 15   CREATININE 1.26* 1.38* 1.35*  CALCIUM 9.4 9.3 9.3  MG 2.1 2.1 2.1  GFRNONAA 59* 53* 54*  ANIONGAP 9 8 8     Lipids No results for input(s): "CHOL", "TRIG", "HDL", "LABVLDL", "LDLCALC", "CHOLHDL" in the last 168 hours.  HematologyNo results for input(s): "WBC", "RBC", "HGB", "HCT", "MCV", "MCH", "MCHC", "RDW", "PLT" in the last 168 hours. Thyroid No results for input(s): "TSH", "FREET4" in the last 168 hours.  BNPNo results for input(s): "BNP", "PROBNP" in the last 168 hours.  DDimer No results for input(s): "DDIMER" in the last 168 hours.   Radiology    No results found.  Cardiac Studies   June 2022, monitor Atrial fibrillation, atrial flutter. Average heart rate 87 bpm. Rare PACs and PVCs. 2 episodes of ventricular tachycardia.  02/13/2021: TTE 1. Left ventricular ejection fraction, by estimation, is 70 to 75%. The  left ventricle has hyperdynamic function. The left ventricle has no  regional wall motion abnormalities. There is mild left ventricular  hypertrophy. Left ventricular diastolic  function could  not be evaluated.   2. Right ventricular systolic function is normal. The right ventricular  size is normal.   3. Left atrial size was moderately dilated.   4. The mitral valve is normal in structure. Mild mitral valve  regurgitation. No evidence of mitral stenosis.   5. The aortic valve is tricuspid. Aortic valve regurgitation is not  visualized. Aortic valve sclerosis is present, with no evidence of aortic  valve stenosis.   6. The inferior vena cava is normal in size with greater than 50%  respiratory variability, suggesting right atrial pressure of 3 mmHg.   Patient Profile     76 y.o. male w/PMHx of HTN, HLD, DM, stroke, p.Fibrosis/COPD, CKD (III), esophageal bleed (hx of clipping) Afib admitted for Tikosyn  Dr. Ladona Ridgel saw him 09/23/21, wore a cardiac monitor while on a beta blocker which demonstrated long  pauses (nocturnal) as well as RVR with rates over 160/min, (also noted some NSVTs), recommended either dofetilide vs AV node RFA and PPM insertion. The risks/benefits/of each approach were reviewed and pt preferred to proceed with dofetilide, though suspect down the road, eventually would require pacing support  Assessment & Plan    Persistent Afib CHA2DS2Vasc is 6, on Xarelto, appropriately dosed Tikosyn load is in progress K+ 4.3 Mag 2.1 Creat 1.35, stable QTc stable   DCCV today, pt aware and agreeable   HTN Home meds  For questions or updates, please contact [facility] Please consult [email or web address] for contact info under        Signed, Sheilah Pigeon, PA-C  10/13/2021, 8:08 AM

## 2021-10-13 NOTE — Interval H&P Note (Signed)
History and Physical Interval Note:  10/13/2021 12:43 PM  Jonathon Stevens  has presented today for surgery, with the diagnosis of afib.  The various methods of treatment have been discussed with the patient and family. After consideration of risks, benefits and other options for treatment, the patient has consented to  Procedure(s): CARDIOVERSION (N/A) as a surgical intervention.  The patient's history has been reviewed, patient examined, no change in status, stable for surgery.  I have reviewed the patient's chart and labs.  Questions were answered to the patient's satisfaction.     Bita Cartwright

## 2021-10-13 NOTE — Discharge Instructions (Signed)

## 2021-10-14 ENCOUNTER — Other Ambulatory Visit (HOSPITAL_COMMUNITY): Payer: Self-pay

## 2021-10-14 LAB — BASIC METABOLIC PANEL
Anion gap: 9 (ref 5–15)
BUN: 18 mg/dL (ref 8–23)
CO2: 23 mmol/L (ref 22–32)
Calcium: 8.9 mg/dL (ref 8.9–10.3)
Chloride: 105 mmol/L (ref 98–111)
Creatinine, Ser: 1.44 mg/dL — ABNORMAL HIGH (ref 0.61–1.24)
GFR, Estimated: 50 mL/min — ABNORMAL LOW (ref >60)
Glucose, Bld: 141 mg/dL — ABNORMAL HIGH (ref 70–99)
Potassium: 4.1 mmol/L (ref 3.5–5.1)
Sodium: 137 mmol/L (ref 135–145)

## 2021-10-14 LAB — MAGNESIUM: Magnesium: 2.2 mg/dL (ref 1.7–2.4)

## 2021-10-14 MED ORDER — DOFETILIDE 250 MCG PO CAPS
250.0000 ug | ORAL_CAPSULE | Freq: Two times a day (BID) | ORAL | 5 refills | Status: DC
  Filled 2021-10-14: qty 60, 30d supply, fill #0

## 2021-10-14 NOTE — Progress Notes (Signed)
Pharmacy: Dofetilide (Tikosyn) - Follow Up Assessment and Electrolyte Replacement  Pharmacy consulted to assist in monitoring and replacing electrolytes in this 76 y.o. male admitted on 10/11/2021 undergoing dofetilide initiation. First dofetilide dose: 10/11/21  Labs:    Component Value Date/Time   K 4.1 10/14/2021 0246   MG 2.2 10/14/2021 0246     Plan: Potassium: K >/= 4: No additional supplementation needed  Magnesium: Mg > 2: No additional supplementation needed   Fredonia Highland, PharmD, BCPS, [facility] Clinical Pharmacist [phone] Please check AMION for all [facility] Pharmacy numbers 10/14/2021

## 2021-10-14 NOTE — Care Management Important Message (Signed)
Important Message  Patient Details  Name: Jonathon Stevens MRN: [id number] Date of Birth: May 13, 1945   Medicare Important Message Given:  Yes     Renie Ora 10/14/2021, 9:56 AM

## 2021-10-14 NOTE — Discharge Summary (Signed)
   ELECTROPHYSIOLOGY PROCEDURE DISCHARGE SUMMARY    Patient ID: Jonathon Stevens,  MRN: [id number], DOB/AGE: 08/19/1945 76 y.o.  Admit date: 10/11/2021 Discharge date: 10/14/2021  Primary Care Physician: Wilson, Fred H, MD  Primary Cardiologist: Dr. varanasi Electrophysiologist: Dr. taylor  Primary Discharge Diagnosis:  1.  persistent atrial fibrillation status post Tikosyn loading this admission      CHA2DS2Vasc is 6, on Xarelto  Secondary Discharge Diagnosis:  HTN  Allergies  Allergen Reactions   Cyclobenzaprine Nausea Only   Indomethacin Rash     Procedures This Admission:  1.  Tikosyn loading 2.  Direct current cardioversion on 10/13/21 by Dr Tobb which successfully restored SR.  There were no early apparent complications.   Brief HPI: Jonathon Stevens is a 76 y.o. male with a past medical history as noted above.  He is followed by Dr. Taylor.  Risks, benefits, and alternatives to Tikosyn were reviewed with the patient who wished to proceed.    Hospital Course:  The patient was admitted and Tikosyn was initiated.  Renal function and electrolytes were followed during the hospitalization.  The patient's QTc remained stable.  On 10/13/21 the patient underwent direct current cardioversion which restored sinus rhythm.  He was monitored until discharge on telemetry which demonstrated AFib > SB/SR 50's-60's, 1st degree Avblock, rare PVCs, rare NSMMVT (known for him).  On the day of discharge, he feels well, particualrly better in SR.  He was examined by Dr Lambert who considered the patient stable for discharge to home.  Follow-up has been arranged with the AFib clinic in 1 week and with EP service in 4 weeks.   Tikosyn teaching was completed No new or additional electrolyte replacement for home   Physical Exam: Vitals:   10/13/21 1610 10/13/21 2015 10/13/21 2330 10/14/21 0420  BP: 106/65 (!) 145/71 119/68 109/65  Pulse: 64 63 (!) 57 60  Resp: 16 17  15  Temp: 97.7 F (36.5  C) 98.4 F (36.9 C) 97.9 F (36.6 C) 98 F (36.7 C)  TempSrc: Oral Oral Oral Oral  SpO2: 97% 95% 96% 98%  Weight:      Height:        GEN- The patient is well appearing, alert and oriented x 3 today.   HEENT: normocephalic, atraumatic; sclera clear, conjunctiva pink; hearing intact; oropharynx clear; neck supple, no JVP Lymph- no cervical lymphadenopathy Lungs- CTA b/l, normal work of breathing.  No wheezes, rales, rhonchi Heart- RRR, no murmurs, rubs or gallops, PMI not laterally displaced GI- soft, non-tender, non-distended Extremities- no clubbing, cyanosis, or edema MS- no significant deformity or atrophy Skin- warm and dry, no rash or lesion Psych- euthymic mood, full affect Neuro- strength and sensation are intact   Labs:   Lab Results  Component Value Date   WBC 7.3 05/16/2021   HGB 15.7 05/16/2021   HCT 46.4 05/16/2021   MCV 84 05/16/2021   PLT 201 05/16/2021    Recent Labs  Lab 10/14/21 0246  NA 137  K 4.1  CL 105  CO2 23  BUN 18  CREATININE 1.44*  CALCIUM 8.9  GLUCOSE 141*     Discharge Medications:  Allergies as of 10/14/2021       Reactions   Cyclobenzaprine Nausea Only   Indomethacin Rash        Medication List     TAKE these medications    acetaminophen 650 MG CR tablet Commonly known as: TYLENOL Take 1,300 mg by mouth every 8 (eight) hours as   needed for pain.   allopurinol 300 MG tablet Commonly known as: ZYLOPRIM Take 150 mg by mouth daily after breakfast.   atorvastatin 10 MG tablet Commonly known as: LIPITOR Take 1 tablet (10 mg total) by mouth daily.   blood glucose meter kit and supplies Kit Dispense based on patient and insurance preference. Use up to four times daily as directed. (FOR ICD-9 250.00, 250.01).   Calcium Carb-Magnesium Carb 80-115 MG Tabs Take 1 tablet by mouth daily.   COENZYME Q10 PO Take 1 tablet by mouth daily.   dofetilide 250 MCG capsule Commonly known as: TIKOSYN Take 1 capsule (250 mcg  total) by mouth 2 (two) times daily.   FISH OIL PO Take 2 tablets by mouth daily.   GLUCOSAMINE-CHONDROITIN PO Take 1 tablet by mouth daily.   Magnesium 250 MG Tabs Take 1 tablet by mouth daily.   multivitamin tablet Take 1 tablet by mouth daily.   pantoprazole 40 MG tablet Commonly known as: PROTONIX Take 1 tablet (40 mg total) by mouth daily.   Prolia 60 MG/ML Sosy injection Generic drug: denosumab Inject 60 mg into the skin every 6 (six) months.   Turmeric 400 MG Caps Take by mouth.   Vitamin D3 125 MCG (5000 UT) Caps Take 1 capsule by mouth daily.   Xarelto 20 MG Tabs tablet Generic drug: rivaroxaban TAKE 1 TABLET(20 MG) BY MOUTH DAILY What changed: See the new instructions.        Disposition: Home Discharge Instructions     Diet - low sodium heart healthy   Complete by: As directed    Increase activity slowly   Complete by: As directed         Duration of Discharge Encounter: Greater than 30 minutes including physician time.  Venetia Night, PA-C 10/14/2021 11:20 AM

## 2021-10-14 NOTE — Plan of Care (Signed)

## 2021-10-16 ENCOUNTER — Encounter (HOSPITAL_COMMUNITY): Payer: Self-pay | Admitting: Cardiology

## 2021-10-21 ENCOUNTER — Ambulatory Visit (HOSPITAL_COMMUNITY)
Admit: 2021-10-21 | Discharge: 2021-10-21 | Disposition: A | Discharge status: Home / Self Care | Payer: Medicare Other | Attending: Physician Assistant | Admitting: Physician Assistant

## 2021-10-21 VITALS — BP 140/76 | HR 80 | Ht 70.0 in | Wt 189.6 lb

## 2021-10-21 DIAGNOSIS — I2510 Atherosclerotic heart disease of native coronary artery without angina pectoris: Secondary | ICD-10-CM | POA: Insufficient documentation

## 2021-10-21 DIAGNOSIS — D6869 Other thrombophilia: Secondary | ICD-10-CM

## 2021-10-21 DIAGNOSIS — I129 Hypertensive chronic kidney disease with stage 1 through stage 4 chronic kidney disease, or unspecified chronic kidney disease: Secondary | ICD-10-CM | POA: Diagnosis not present

## 2021-10-21 DIAGNOSIS — Z8673 Personal history of transient ischemic attack (TIA), and cerebral infarction without residual deficits: Secondary | ICD-10-CM | POA: Diagnosis not present

## 2021-10-21 DIAGNOSIS — I714 Abdominal aortic aneurysm, without rupture, unspecified: Secondary | ICD-10-CM | POA: Diagnosis not present

## 2021-10-21 DIAGNOSIS — J449 Chronic obstructive pulmonary disease, unspecified: Secondary | ICD-10-CM | POA: Diagnosis not present

## 2021-10-21 DIAGNOSIS — I4819 Other persistent atrial fibrillation: Secondary | ICD-10-CM | POA: Insufficient documentation

## 2021-10-21 DIAGNOSIS — Z7901 Long term (current) use of anticoagulants: Secondary | ICD-10-CM | POA: Insufficient documentation

## 2021-10-21 DIAGNOSIS — N189 Chronic kidney disease, unspecified: Secondary | ICD-10-CM | POA: Insufficient documentation

## 2021-10-21 DIAGNOSIS — I4892 Unspecified atrial flutter: Secondary | ICD-10-CM | POA: Insufficient documentation

## 2021-10-21 DIAGNOSIS — I484 Atypical atrial flutter: Secondary | ICD-10-CM | POA: Diagnosis not present

## 2021-10-21 MED ORDER — DOFETILIDE 250 MCG PO CAPS: 250.0000 ug | ORAL_CAPSULE | Freq: Two times a day (BID) | ORAL | 3 refills | Status: DC

## 2021-10-21 NOTE — Progress Notes (Signed)
Primary Care Physician: Christain [place], MD Primary Cardiologist: Dr Irish Lack  Primary Electrophysiologist: Dr Lovena Le Referring Physician: Dr Lovena Le   Jonathon Stevens is a 76 y.o. male with a history of CVA, AAA, right common iliac aneurysm, HLD, pulmonary fibrosis, COPD, CKD, esophageal bleed s/p clipping, atrial flutter, atrial fibrillation who presents for follow up in the [facility]. Patient is on Xarelto for a CHADS2VASC score of 6. He wore a cardiac monitor which showed 100% afib burden but also did have VT (22 beats) and pauses up to 10.5 seconds. He was seen by Dr Lovena Le 09/23/21 who recommended dofetilide.   On follow up today, patient is s/p dofetilide loading 7/18-7/21/23 with DCCV on 10/13/21. Patient is back in rate controlled atrial flutter today. He was very surprised by this as he has been feeling much better with more energy since discharge. There were no specific triggers that he could identify.   Today, he denies symptoms of palpitations, chest pain, orthopnea, PND, lower extremity edema, dizziness, presyncope, syncope, snoring, daytime somnolence, bleeding, or neurologic sequela. The patient is tolerating medications without difficulties and is otherwise without complaint today.    Atrial Fibrillation Risk Factors:  he does not have symptoms or diagnosis of sleep apnea. he does not have a history of rheumatic fever.   he has a BMI of Body mass index is 27.2 kg/m.Marland Kitchen Filed Weights   10/21/21 1129  Weight: 86 kg    Family History  Problem Relation Age of Onset   Hypertension Mother    Stroke Mother    Hypertension Father    Heart attack Neg Hx      Atrial Fibrillation Management history:  Previous antiarrhythmic drugs: dofetilide Previous cardioversions: 10/13/21 Previous ablations: none CHADS2VASC score: 6 Anticoagulation history: Xarelto    Past Medical History:  Diagnosis Date   Atrial flutter (HCC)    CVA (cerebral  infarction)    embolic CVA 06/9177   Diabetes mellitus without complication (HCC)    Gout    HLD (hyperlipidemia)    Hx of echocardiogram    a. ECHO 10/08/13: EF 45-50%, mild hypokinesis of apical myocardium. Mild LA dilation, mild RA dilation;  b.  TEE (10/09/13):  EF 50-55%, normal wall motion, no LAA clot   Hypertension    Stroke [facility])    Past Surgical History:  Procedure Laterality Date   CARDIOVERSION N/A 10/13/2021   Procedure: CARDIOVERSION;  Surgeon: Berniece Salines, DO;  Location: [place];  Service: Cardiovascular;  Laterality: N/A;   ESOPHAGOGASTRODUODENOSCOPY (EGD) WITH PROPOFOL N/A 02/14/2021   Procedure: ESOPHAGOGASTRODUODENOSCOPY (EGD) WITH PROPOFOL;  Surgeon: Carol Ada, MD;  Location: [place];  Service: Endoscopy;  Laterality: N/A;   HEMOSTASIS CLIP PLACEMENT  02/14/2021   Procedure: HEMOSTASIS CLIP PLACEMENT;  Surgeon: Carol Ada, MD;  Location: [place];  Service: Endoscopy;;   TEE WITHOUT CARDIOVERSION N/A 10/09/2013   Procedure: TRANSESOPHAGEAL ECHOCARDIOGRAM (TEE);  Surgeon: Pixie Casino, MD;  Location[facility] ENDOSCOPY;  Service: Cardiovascular;  Laterality: N/A;    Current Outpatient Medications  Medication Sig Dispense Refill   acetaminophen (TYLENOL) 650 MG CR tablet Take 1,300 mg by mouth every 8 (eight) hours as needed for pain.     allopurinol (ZYLOPRIM) 300 MG tablet Take 150 mg by mouth daily after breakfast.     atorvastatin (LIPITOR) 10 MG tablet Take 1 tablet (10 mg total) by mouth daily. 90 tablet 3   blood glucose meter kit and supplies KIT Dispense based on patient and insurance  preference. Use up to four times daily as directed. (FOR ICD-9 250.00, 250.01). 1 each 0   Calcium Carb-Magnesium Carb 80-115 MG TABS Take 1 tablet by mouth daily.     Cholecalciferol (VITAMIN D3) 125 MCG (5000 UT) CAPS Take 1 capsule by mouth daily.     COENZYME Q10 PO Take 1 tablet by mouth daily.      dofetilide (TIKOSYN) 250 MCG capsule Take 1 capsule (250 mcg  total) by mouth 2 (two) times daily. 60 capsule 5   GLUCOSAMINE-CHONDROITIN PO Take 1 tablet by mouth daily.      Magnesium 250 MG TABS Take 1 tablet by mouth daily.     Multiple Vitamin (MULTIVITAMIN) tablet Take 1 tablet by mouth daily.     pantoprazole (PROTONIX) 40 MG tablet Take 1 tablet (40 mg total) by mouth daily. 30 tablet 1   PROLIA 60 MG/ML SOSY injection Inject 60 mg into the skin every 6 (six) months.     XARELTO 20 MG TABS tablet TAKE 1 TABLET(20 MG) BY MOUTH DAILY (Patient taking differently: Take 20 mg by mouth daily.) 90 tablet 1   No current facility-administered medications for this encounter.    Allergies  Allergen Reactions   Cyclobenzaprine Nausea Only   Indomethacin Rash    Social History   Socioeconomic History   Marital status: Unknown    Spouse name: [name]   Number of children: 2   Years of education: college   Highest education level: Not on file  Occupational History   Occupation: retired   Tobacco Use   Smoking status: Former    Packs/day: 0.50    Years: 15.00    Total pack years: 7.50    Types: Cigarettes    Quit date: 07/13/2015    Years since quitting: 6.2    Passive exposure: Never   Smokeless tobacco: Never   Tobacco comments:    Former smoker 10/11/21  Vaping Use   Vaping Use: Never used  Substance and Sexual Activity   Alcohol use: No    Alcohol/week: 0.0 standard drinks of alcohol   Drug use: No   Sexual activity: Yes  Other Topics Concern   Not on file  Social History Narrative   Patient is married lives with Spouse [name]).   Patient is right handed.   Patient has a Secretary/administrator Degree   Patient drinks 1 cup of coffee daily   Social Determinants of Health   Financial Resource Strain: Not on file  Food Insecurity: Not on file  Transportation Needs: Not on file  Physical Activity: Not on file  Stress: Not on file  Social Connections: Not on file  Intimate Partner Violence: Not on file     ROS- All systems are reviewed  and negative except as per the HPI above.  Physical Exam: Vitals:   10/21/21 1129  BP: 140/76  Pulse: 80  Weight: 86 kg  Height: _0  (1.778 m)     GEN- The patient is a well appearing elderly male, alert and oriented x 3 today.   HEENT-head normocephalic, atraumatic, sclera clear, conjunctiva pink, hearing intact, trachea midline. Lungs- Clear to ausculation bilaterally, normal work of breathing Heart- irregular rate and rhythm, no murmurs, rubs or gallops  GI- soft, NT, ND, + BS Extremities- no clubbing, cyanosis, or edema MS- no significant deformity or atrophy Skin- no rash or lesion Psych- euthymic mood, full affect Neuro- strength and sensation are intact   Wt Readings from Last 3 Encounters:  10/21/21 86  kg  10/11/21 87.1 kg  10/11/21 86.1 kg    EKG today demonstrates  Atypical atrial flutter with variable block Vent. rate 80 BPM PR interval * ms QRS duration 86 ms QT/QTcB 408/470 ms  Echo 02/13/21 demonstrated   1. Left ventricular ejection fraction, by estimation, is 70 to 75%. The  left ventricle has hyperdynamic function. The left ventricle has no  regional wall motion abnormalities. There is mild left ventricular  hypertrophy. Left ventricular diastolic function could not be evaluated.   2. Right ventricular systolic function is normal. The right ventricular  size is normal.   3. Left atrial size was moderately dilated.   4. The mitral valve is normal in structure. Mild mitral valve  regurgitation. No evidence of mitral stenosis.   5. The aortic valve is tricuspid. Aortic valve regurgitation is not  visualized. Aortic valve sclerosis is present, with no evidence of aortic valve stenosis.   6. The inferior vena cava is normal in size with greater than 50%  respiratory variability, suggesting right atrial pressure of 3 mmHg.   Epic records are reviewed at length today  CHA2DS2-VASc Score = 6  The patient's score is based upon: CHF History: 0 HTN  History: 1 Diabetes History: 0 Stroke History: 2 Vascular Disease History: 1 Age Score: 2 Gender Score: 0       ASSESSMENT AND PLAN: 1. Persistent Atrial Fibrillation/atrial flutter The patient's CHA2DS2-VASc score is 6, indicating a 9.7% annual risk of stroke.   S/p dofetilide admission 7/18-7/21/23 Patient in atrial flutter today but he has felt symptomatic improvement.  ? If he is paroxysmal or persistent.  Will have him monitor afib with Apple Watch daily and review strips next week. If he is persistent, will arrange for DCCV.  Continue dofetilide 250 mcg BID Continue Xarelto 20 mg daily Off AV nodal agents with h/o significant bradycardia/pauses.   2. Secondary Hypercoagulable State (ICD10:  D68.69) The patient is at significant risk for stroke/thromboembolism based upon his CHA2DS2-VASc Score of 6.  Continue Rivaroxaban (Xarelto).   3. HTN Stable, no changes today.  4. CAD On statin No anginal symptoms.   Follow up in the AF clinic in one week.    [facility] [address] [place], [place] [address] [phone] 10/21/2021 11:57 AM

## 2021-10-27 ENCOUNTER — Ambulatory Visit: Payer: Medicare Other | Admitting: Pulmonary Disease

## 2021-10-27 ENCOUNTER — Encounter: Payer: Self-pay | Admitting: Pulmonary Disease

## 2021-10-27 VITALS — BP 142/70 | HR 109 | Temp 98.0°F | Ht 70.0 in | Wt 192.4 lb

## 2021-10-27 DIAGNOSIS — J849 Interstitial pulmonary disease, unspecified: Secondary | ICD-10-CM | POA: Diagnosis not present

## 2021-10-27 NOTE — Patient Instructions (Signed)
I have reviewed your imaging and studies that show emphysema and mild interstitial changes that could be from prior COVID We will continue to monitor this Order pulmonary function test in 6 months and follow-up in 35-months

## 2021-10-27 NOTE — Progress Notes (Signed)
Jonathon Stevens    [id number]    12/06/1945  Primary Care Physician:Wilson, Jama Flavors, MD  Referring Physician: Marylu Lund., NP [address] [place],  [place] [address]  Chief complaint: Consult for interstitial lung disease  HPI: 76 y.o. with CVA, AAA, right common iliac aneurysm, HLD, pulmonary fibrosis, COPD, CKD, esophageal bleed s/p clipping, atrial flutter, atrial fibrillation  He had a recent CT showing mild interstitial changes and has been referred here for further evaluation.  History notable for COVID infection requiring hospitalization for 2 weeks in January 2021.  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  He was previously evaluated by Dr. Gaynelle Arabian, pulmonology at [facility] tried on stiolto inhaler but stopped after he did not notice any improvement  He has significant cardiac history with CHF, longstanding issue with recurrent atrial fibrillation.  He underwent cardioversion and Tikosyn initiation in July 2023 with improvement in symptoms of dyspnea but appears to be back in atrial fibrillation/atrial flutter again.  Pets: No pets Occupation: Used to work in [employer] Exposures: No exposure history.  No mold, hot tub, Jacuzzi.  No feather pillows or comforters Smoking history:10-pack-year smoker.  Quit in 2013 Travel history: No significant travel Relevant family history: No family history of lung disease  Outpatient Encounter Medications as of 10/27/2021  Medication Sig   acetaminophen (TYLENOL) 650 MG CR tablet Take 1,300 mg by mouth every 8 (eight) hours as needed for pain.   allopurinol (ZYLOPRIM) 300 MG tablet Take 150 mg by mouth daily after breakfast.   atorvastatin (LIPITOR) 10 MG tablet Take 1 tablet (10 mg total) by mouth daily.   blood glucose meter kit and supplies KIT Dispense based on patient and insurance preference. Use up to four times daily as directed. (FOR ICD-9 250.00, 250.01).    Calcium Carb-Magnesium Carb 80-115 MG TABS Take 1 tablet by mouth daily.   Cholecalciferol (VITAMIN D3) 125 MCG (5000 UT) CAPS Take 1 capsule by mouth daily.   COENZYME Q10 PO Take 1 tablet by mouth daily.    dofetilide (TIKOSYN) 250 MCG capsule Take 1 capsule (250 mcg total) by mouth 2 (two) times daily.   GLUCOSAMINE-CHONDROITIN PO Take 1 tablet by mouth daily.    Magnesium 250 MG TABS Take 1 tablet by mouth daily.   Multiple Vitamin (MULTIVITAMIN) tablet Take 1 tablet by mouth daily.   pantoprazole (PROTONIX) 40 MG tablet Take 1 tablet (40 mg total) by mouth daily.   PROLIA 60 MG/ML SOSY injection Inject 60 mg into the skin every 6 (six) months.   XARELTO 20 MG TABS tablet TAKE 1 TABLET(20 MG) BY MOUTH DAILY (Patient taking differently: Take 20 mg by mouth daily.)   No facility-administered encounter medications on file as of 10/27/2021.    Allergies as of 10/27/2021 - Review Complete 10/27/2021  Allergen Reaction Noted   Cyclobenzaprine Nausea Only 10/12/2017   Indomethacin Rash 07/20/2015    Past Medical History:  Diagnosis Date   Atrial flutter (HCC)    CVA (cerebral infarction)    embolic CVA 05/7541   Diabetes mellitus without complication (HCC)    Gout    HLD (hyperlipidemia)    Hx of echocardiogram    a. ECHO 10/08/13: EF 45-50%, mild hypokinesis of apical myocardium. Mild LA dilation, mild RA dilation;  b.  TEE (10/09/13):  EF 50-55%, normal wall motion, no LAA clot   Hypertension  Stroke [facility])     Past Surgical History:  Procedure Laterality Date   CARDIOVERSION N/A 10/13/2021   Procedure: CARDIOVERSION;  Surgeon: Berniece Salines, DO;  Location: [place] ENDOSCOPY;  Service: Cardiovascular;  Laterality: N/A;   ESOPHAGOGASTRODUODENOSCOPY (EGD) WITH PROPOFOL N/A 02/14/2021   Procedure: ESOPHAGOGASTRODUODENOSCOPY (EGD) WITH PROPOFOL;  Surgeon: Carol Ada, MD;  Location: [place];  Service: Endoscopy;  Laterality: N/A;   HEMOSTASIS CLIP PLACEMENT  02/14/2021   Procedure:  HEMOSTASIS CLIP PLACEMENT;  Surgeon: Carol Ada, MD;  Location: [place];  Service: Endoscopy;;   TEE WITHOUT CARDIOVERSION N/A 10/09/2013   Procedure: TRANSESOPHAGEAL ECHOCARDIOGRAM (TEE);  Surgeon: Pixie Casino, MD;  Location: [facility] ENDOSCOPY;  Service: Cardiovascular;  Laterality: N/A;    Family History  Problem Relation Age of Onset   Hypertension Mother    Stroke Mother    Hypertension Father    Heart attack Neg Hx     Social History   Socioeconomic History   Marital status: Unknown    Spouse name: [name]   Number of children: 2   Years of education: college   Highest education level: Not on file  Occupational History   Occupation: retired   Tobacco Use   Smoking status: Former    Packs/day: 0.50    Years: 15.00    Total pack years: 7.50    Types: Cigarettes    Quit date: 07/13/2015    Years since quitting: 6.2    Passive exposure: Never   Smokeless tobacco: Never   Tobacco comments:    Former smoker 10/11/21  Vaping Use   Vaping Use: Never used  Substance and Sexual Activity   Alcohol use: No    Alcohol/week: 0.0 standard drinks of alcohol   Drug use: No   Sexual activity: Yes  Other Topics Concern   Not on file  Social History Narrative   Patient is married lives with Spouse [name]).   Patient is right handed.   Patient has a Secretary/administrator Degree   Patient drinks 1 cup of coffee daily   Social Determinants of Health   Financial Resource Strain: Not on file  Food Insecurity: Not on file  Transportation Needs: Not on file  Physical Activity: Not on file  Stress: Not on file  Social Connections: Not on file  Intimate Partner Violence: Not on file    Review of systems: Review of Systems  Constitutional: Negative for fever and chills.  HENT: Negative.   Eyes: Negative for blurred vision.  Respiratory: as per HPI  Cardiovascular: Negative for chest pain and palpitations.  Gastrointestinal: Negative for vomiting, diarrhea, blood per  rectum. Genitourinary: Negative for dysuria, urgency, frequency and hematuria.  Musculoskeletal: Negative for myalgias, back pain and joint pain.  Skin: Negative for itching and rash.  Neurological: Negative for dizziness, tremors, focal weakness, seizures and loss of consciousness.  Endo/Heme/Allergies: Negative for environmental allergies.  Psychiatric/Behavioral: Negative for depression, suicidal ideas and hallucinations.  All other systems reviewed and are negative.  Physical Exam: Blood pressure (!) 142/70, pulse (!) 109, temperature 98 F (36.7 C), temperature source Oral, height _0  (1.778 m), weight 192 lb 6.4 oz (87.3 kg), SpO2 98 %. Gen:      No acute distress HEENT:  EOMI, sclera anicteric Neck:     No masses; no thyromegaly Lungs:    Clear to auscultation bilaterally; normal respiratory effort CV:         Regular rate and rhythm; no murmurs Abd:      + bowel sounds;  soft, non-tender; no palpable masses, no distension Ext:    No edema; adequate peripheral perfusion Skin:      Warm and dry; no rash Neuro: alert and oriented x 3 Psych: normal mood and affect  Data Reviewed: Imaging: CT chest [facility] 4/8/20201- 6 mm pulmonary nodule, emphysema, bilateral lower lobe reticulation with septal thickening and traction bronchiectasis.  Indeterminate pattern.  CT high-resolution 09/26/2021-pleural septal thickening with trace reticulation.  Indeterminate for UIP.  Pulmonary nodules measuring up to 4 mm. I have reviewed the images personally.  PFTs:  P[facility] from [facility] 10/13/2019 FEV1/FVC: 84% FVC: 3.16/71% FEV1: 2.6/70% TLC: 5.19/74% DLCO uncorrected: 45%  Labs: Connective tissue disease serologies 07/09/2019-positive for ANA 1: 320 speckled pattern, negative CCP, SCL 70, SSA, SSB  Assessment:  Evaluation for interstitial lung disease He has very mild changes at the base which is indeterminate for UIP pattern.  I have reviewed his scans from [facility] in 2021  and feel that his ILD is actually better as the groundglass opacities have improved.  This could be result of his COVID infection requiring hospitalization in early January 2021.  Overall is not concerning for significant progressive interstitial lung disease and his dyspnea is likely related to his ongoing issues with recurrent atrial fibrillation/flutter  There are no significant exposures or signs and symptoms of connective tissue disease.  Positive ANA 2021 is likely nonspecific  We will continue to monitor Order PFTs in 6 months and follow-up.   Emphysema No obstruction on prior PFTs.  He has been on stiolto before but did not have any benefit from it and does not want to retry  Plan/Recommendations: PFTs in 6 months  Marshell Garfinkel MD [facility] 10/27/2021, 11:32 AM  CC: Marylu Lund., NP

## 2021-10-28 ENCOUNTER — Encounter (HOSPITAL_COMMUNITY): Payer: Self-pay | Admitting: Physician Assistant

## 2021-10-28 ENCOUNTER — Ambulatory Visit (HOSPITAL_COMMUNITY)
Admission: RE | Admit: 2021-10-28 | Discharge: 2021-10-28 | Disposition: A | Discharge status: Home / Self Care | Payer: Medicare Other | Source: Ambulatory Visit | Attending: Physician Assistant | Admitting: Physician Assistant

## 2021-10-28 VITALS — BP 128/90 | HR 90 | Ht 70.0 in | Wt 190.8 lb

## 2021-10-28 DIAGNOSIS — I4892 Unspecified atrial flutter: Secondary | ICD-10-CM | POA: Insufficient documentation

## 2021-10-28 DIAGNOSIS — Z7901 Long term (current) use of anticoagulants: Secondary | ICD-10-CM | POA: Insufficient documentation

## 2021-10-28 DIAGNOSIS — I2510 Atherosclerotic heart disease of native coronary artery without angina pectoris: Secondary | ICD-10-CM | POA: Insufficient documentation

## 2021-10-28 DIAGNOSIS — D6869 Other thrombophilia: Secondary | ICD-10-CM | POA: Diagnosis not present

## 2021-10-28 DIAGNOSIS — Z8673 Personal history of transient ischemic attack (TIA), and cerebral infarction without residual deficits: Secondary | ICD-10-CM | POA: Diagnosis not present

## 2021-10-28 DIAGNOSIS — I4819 Other persistent atrial fibrillation: Secondary | ICD-10-CM | POA: Insufficient documentation

## 2021-10-28 DIAGNOSIS — N189 Chronic kidney disease, unspecified: Secondary | ICD-10-CM | POA: Insufficient documentation

## 2021-10-28 DIAGNOSIS — Z8249 Family history of ischemic heart disease and other diseases of the circulatory system: Secondary | ICD-10-CM | POA: Diagnosis not present

## 2021-10-28 DIAGNOSIS — Z87891 Personal history of nicotine dependence: Secondary | ICD-10-CM | POA: Insufficient documentation

## 2021-10-28 DIAGNOSIS — E785 Hyperlipidemia, unspecified: Secondary | ICD-10-CM | POA: Diagnosis not present

## 2021-10-28 DIAGNOSIS — Z79899 Other long term (current) drug therapy: Secondary | ICD-10-CM | POA: Insufficient documentation

## 2021-10-28 DIAGNOSIS — J449 Chronic obstructive pulmonary disease, unspecified: Secondary | ICD-10-CM | POA: Diagnosis not present

## 2021-10-28 DIAGNOSIS — I129 Hypertensive chronic kidney disease with stage 1 through stage 4 chronic kidney disease, or unspecified chronic kidney disease: Secondary | ICD-10-CM | POA: Diagnosis not present

## 2021-10-28 LAB — CBC
HCT: 45.3 % (ref 39.0–52.0)
Hemoglobin: 15.3 g/dL (ref 13.0–17.0)
MCH: 30.1 pg (ref 26.0–34.0)
MCHC: 33.8 g/dL (ref 30.0–36.0)
MCV: 89 fL (ref 80.0–100.0)
Platelets: 168 10*3/uL (ref 150–400)
RBC: 5.09 MIL/uL (ref 4.22–5.81)
RDW: 13.2 % (ref 11.5–15.5)
WBC: 7.1 10*3/uL (ref 4.0–10.5)
nRBC: 0 % (ref 0.0–0.2)

## 2021-10-28 LAB — BASIC METABOLIC PANEL
Anion gap: 7 (ref 5–15)
BUN: 11 mg/dL (ref 8–23)
CO2: 22 mmol/L (ref 22–32)
Calcium: 9.2 mg/dL (ref 8.9–10.3)
Chloride: 108 mmol/L (ref 98–111)
Creatinine, Ser: 1.34 mg/dL — ABNORMAL HIGH (ref 0.61–1.24)
GFR, Estimated: 55 mL/min — ABNORMAL LOW (ref >60)
Glucose, Bld: 195 mg/dL — ABNORMAL HIGH (ref 70–99)
Potassium: 4 mmol/L (ref 3.5–5.1)
Sodium: 137 mmol/L (ref 135–145)

## 2021-10-28 LAB — MAGNESIUM: Magnesium: 2.1 mg/dL (ref 1.7–2.4)

## 2021-10-28 NOTE — Progress Notes (Signed)
Primary Care Physician: Christain Sacramento, MD Primary Cardiologist: Dr Irish Lack  Primary Electrophysiologist: Dr Lovena Le Referring Physician: Dr Lovena Le   Jonathon Stevens is a 76 y.o. male with a history of CVA, AAA, right common iliac aneurysm, HLD, pulmonary fibrosis, COPD, CKD, esophageal bleed s/p clipping, atrial flutter, atrial fibrillation who presents for follow up in the [facility]. Patient is on Xarelto for a CHADS2VASC score of 6. He wore a cardiac monitor which showed 100% afib burden but also did have VT (22 beats) and pauses up to 10.5 seconds. He was seen by Dr Lovena Le 09/23/21 who recommended dofetilide.   Patient is s/p dofetilide loading 7/18-7/21/23 with DCCV on 10/13/21. He was in rate controlled atrial flutter on follow up but was symptomatically improved.   On follow up today, patient reports that he had been doing very well until yesterday morning when he noted more fatigue. He checked his smart watch and it has shown afib ever since. Smart watch strips prior to that showed SR (personally reviewed). He is disappointed he is back out of rhythm.   Today, he denies symptoms of palpitations, chest pain, orthopnea, PND, lower extremity edema, dizziness, presyncope, syncope, snoring, daytime somnolence, bleeding, or neurologic sequela. The patient is tolerating medications without difficulties and is otherwise without complaint today.    Atrial Fibrillation Risk Factors:  he does not have symptoms or diagnosis of sleep apnea. he does not have a history of rheumatic fever.   he has a BMI of Body mass index is 27.38 kg/m.Marland Kitchen Filed Weights   10/28/21 0955  Weight: 86.5 kg    Family History  Problem Relation Age of Onset   Hypertension Mother    Stroke Mother    Hypertension Father    Heart attack Neg Hx      Atrial Fibrillation Management history:  Previous antiarrhythmic drugs: dofetilide Previous cardioversions: 10/13/21 Previous  ablations: none CHADS2VASC score: 6 Anticoagulation history: Xarelto    Past Medical History:  Diagnosis Date   Atrial flutter ([place])    CVA (cerebral infarction)    embolic CVA 06/979   Diabetes mellitus without complication (HCC)    Gout    HLD (hyperlipidemia)    Hx of echocardiogram    a. ECHO 10/08/13: EF 45-50%, mild hypokinesis of apical myocardium. Mild LA dilation, mild RA dilation;  b.  TEE (10/09/13):  EF 50-55%, normal wall motion, no LAA clot   Hypertension    Stroke [facility])    Past Surgical History:  Procedure Laterality Date   CARDIOVERSION N/A 10/13/2021   Procedure: CARDIOVERSION;  Surgeon: Berniece Salines, DO;  Location: [place];  Service: Cardiovascular;  Laterality: N/A;   ESOPHAGOGASTRODUODENOSCOPY (EGD) WITH PROPOFOL N/A 02/14/2021   Procedure: ESOPHAGOGASTRODUODENOSCOPY (EGD) WITH PROPOFOL;  Surgeon: Carol Ada, MD;  Location: [place];  Service: Endoscopy;  Laterality: N/A;   HEMOSTASIS CLIP PLACEMENT  02/14/2021   Procedure: HEMOSTASIS CLIP PLACEMENT;  Surgeon: Carol Ada, MD;  Location: [place];  Service: Endoscopy;;   TEE WITHOUT CARDIOVERSION N/A 10/09/2013   Procedure: TRANSESOPHAGEAL ECHOCARDIOGRAM (TEE);  Surgeon: Pixie Casino, MD;  Location: [facility] ENDOSCOPY;  Service: Cardiovascular;  Laterality: N/A;    Current Outpatient Medications  Medication Sig Dispense Refill   acetaminophen (TYLENOL) 650 MG CR tablet Take 1,300 mg by mouth every 8 (eight) hours as needed for pain.     allopurinol (ZYLOPRIM) 300 MG tablet Take 150 mg by mouth daily after breakfast.     atorvastatin (LIPITOR) 10 MG  tablet Take 1 tablet (10 mg total) by mouth daily. 90 tablet 3   blood glucose meter kit and supplies KIT Dispense based on patient and insurance preference. Use up to four times daily as directed. (FOR ICD-9 250.00, 250.01). 1 each 0   Calcium Carb-Magnesium Carb 80-115 MG TABS Take 1 tablet by mouth daily.     Cholecalciferol (VITAMIN D3) 125 MCG (5000  UT) CAPS Take 1 capsule by mouth daily.     COENZYME Q10 PO Take 1 tablet by mouth daily.      dofetilide (TIKOSYN) 250 MCG capsule Take 1 capsule (250 mcg total) by mouth 2 (two) times daily. 60 capsule 3   GLUCOSAMINE-CHONDROITIN PO Take 1 tablet by mouth daily.      Magnesium 250 MG TABS Take 1 tablet by mouth daily.     Multiple Vitamin (MULTIVITAMIN) tablet Take 1 tablet by mouth daily.     pantoprazole (PROTONIX) 40 MG tablet Take 1 tablet (40 mg total) by mouth daily. 30 tablet 1   PROLIA 60 MG/ML SOSY injection Inject 60 mg into the skin every 6 (six) months.     XARELTO 20 MG TABS tablet TAKE 1 TABLET(20 MG) BY MOUTH DAILY 90 tablet 1   No current facility-administered medications for this encounter.    Allergies  Allergen Reactions   Cyclobenzaprine Nausea Only   Indomethacin Rash    Social History   Socioeconomic History   Marital status: Unknown    Spouse name: [name]   Number of children: 2   Years of education: college   Highest education level: Not on file  Occupational History   Occupation: retired   Tobacco Use   Smoking status: Former    Packs/day: 0.50    Years: 15.00    Total pack years: 7.50    Types: Cigarettes    Quit date: 07/13/2015    Years since quitting: 6.2    Passive exposure: Never   Smokeless tobacco: Never   Tobacco comments:    Former smoker 10/11/21  Vaping Use   Vaping Use: Never used  Substance and Sexual Activity   Alcohol use: No    Alcohol/week: 0.0 standard drinks of alcohol   Drug use: No   Sexual activity: Yes  Other Topics Concern   Not on file  Social History Narrative   Patient is married lives with Spouse [name]).   Patient is right handed.   Patient has a Secretary/administrator Degree   Patient drinks 1 cup of coffee daily   Social Determinants of Health   Financial Resource Strain: Not on file  Food Insecurity: Not on file  Transportation Needs: Not on file  Physical Activity: Not on file  Stress: Not on file  Social  Connections: Not on file  Intimate Partner Violence: Not on file     ROS- All systems are reviewed and negative except as per the HPI above.  Physical Exam: Vitals:   10/28/21 0955  BP: (!) 128/90  Pulse: 90  Weight: 86.5 kg  Height: '5\' 10"'  (1.778 m)     GEN- The patient is a well appearing elderly male, alert and oriented x 3 today.   HEENT-head normocephalic, atraumatic, sclera clear, conjunctiva pink, hearing intact, trachea midline. Lungs- Clear to ausculation bilaterally, normal work of breathing Heart- irregular rate and rhythm, no murmurs, rubs or gallops  GI- soft, NT, ND, + BS Extremities- no clubbing, cyanosis, or edema MS- no significant deformity or atrophy Skin- no rash or lesion Psych- euthymic  mood, full affect Neuro- strength and sensation are intact   Wt Readings from Last 3 Encounters:  10/28/21 86.5 kg  10/27/21 87.3 kg  10/21/21 86 kg    EKG today demonstrates  Afib Vent. rate 90 BPM PR interval * ms QRS duration 86 ms QT/QTcB 394/481 ms  Echo 02/13/21 demonstrated   1. Left ventricular ejection fraction, by estimation, is 70 to 75%. The  left ventricle has hyperdynamic function. The left ventricle has no  regional wall motion abnormalities. There is mild left ventricular  hypertrophy. Left ventricular diastolic function could not be evaluated.   2. Right ventricular systolic function is normal. The right ventricular  size is normal.   3. Left atrial size was moderately dilated.   4. The mitral valve is normal in structure. Mild mitral valve  regurgitation. No evidence of mitral stenosis.   5. The aortic valve is tricuspid. Aortic valve regurgitation is not  visualized. Aortic valve sclerosis is present, with no evidence of aortic valve stenosis.   6. The inferior vena cava is normal in size with greater than 50%  respiratory variability, suggesting right atrial pressure of 3 mmHg.   Epic records are reviewed at length today  CHA2DS2-VASc  Score = 6  The patient's score is based upon: CHF History: 0 HTN History: 1 Diabetes History: 0 Stroke History: 2 Vascular Disease History: 1 Age Score: 2 Gender Score: 0       ASSESSMENT AND PLAN: 1. Persistent Atrial Fibrillation/atrial flutter The patient's CHA2DS2-VASc score is 6, indicating a 9.7% annual risk of stroke.   S/p dofetilide admission 7/18-7/21/23 Patient had been in [place] until yesterday, remains in afib today.  We discussed options including watchful waiting vs scheduling DCCV. He would like to go ahead and schedule DCCV and cancel if he goes back to SR. Check bmet/mag/cbc today.  Continue dofetilide 250 mcg BID Continue Xarelto 20 mg daily Off AV nodal agents with h/o significant bradycardia/pauses.  Apple Watch for home monitoring.  2. Secondary Hypercoagulable State (ICD10:  D68.69) The patient is at significant risk for stroke/thromboembolism based upon his CHA2DS2-VASc Score of 6.  Continue Rivaroxaban (Xarelto).   3. HTN Stable, no changes today.  4. CAD On statin No anginal symptoms.   Follow up in the AF clinic post DCCV.    [facility] [address] [place], [place] [address] [phone] 10/28/2021 10:06 AM

## 2021-10-28 NOTE — Patient Instructions (Signed)
Cardioversion scheduled for Thursday. August 10th  - Arrive at the [facility] and go to admitting at 1030am  - Do not eat or drink anything after midnight the night prior to your procedure.  - Take all your morning medication (except diabetic medications) with a sip of water prior to arrival.  - You will not be able to drive home after your procedure.  - Do NOT miss any doses of your blood thinner - if you should miss a dose please notify our office immediately.  - If you feel as if you go back into normal rhythm prior to scheduled cardioversion, please notify our office immediately. If your procedure is canceled in the cardioversion suite you will be charged a cancellation fee.

## 2021-11-02 ENCOUNTER — Encounter (HOSPITAL_COMMUNITY): Payer: Self-pay | Admitting: Anesthesiology

## 2021-11-02 NOTE — CV Procedure (Signed)
   Electrical Cardioversion Procedure Note [place] [name] [id number] 12/01/1945  Procedure: Electrical Cardioversion Indications:  Atrial Fibrillation  Time Out: Verified patient identification, verified procedure,medications/allergies/relevent history reviewed, required imaging and test results available.  Performed  Procedure Details  The patient was NPO after midnight. Anesthesia was administered at the beside  by Dr.*** with ***mg of propofol and ***mg of Lidocaine.  Cardioversion was done with synchronized biphasic defibrillation with AP pads with 150watts.  The patient converted to normal sinus rhythm. The patient tolerated the procedure well   IMPRESSION:  Successful cardioversion of atrial fibrillation    Jonathon Stevens 11/02/2021, 8:43 PM

## 2021-11-02 NOTE — Anesthesia Preprocedure Evaluation (Signed)
Anesthesia Evaluation    Reviewed: Allergy & Precautions, Patient's Chart, lab work & pertinent test results  History of Anesthesia Complications Negative for: history of anesthetic complications  Airway Mallampati: II  TM Distance: >3 FB     Dental  (+) Dental Advisory Given, Edentulous Lower, Edentulous Upper   Pulmonary neg pulmonary ROS, former smoker,           Cardiovascular hypertension,  Rhythm:Irregular Rate:Tachycardia  EST .  The study is normal. The study is low risk. .  No ST deviation was noted. .  LV perfusion is normal. There is no evidence of ischemia. There is no evidence of infarction. .  Left ventricular function is normal. Nuclear stress EF: 71 %. The left ventricular ejection fraction is hyperdynamic (>65%). End diastolic cavity size is normal. .  Prior study not available for comparison.    Neuro/Psych CVA    GI/Hepatic negative GI ROS, Neg liver ROS,   Endo/Other  diabetes  Renal/GU negative Renal ROS     Musculoskeletal negative musculoskeletal ROS (+)   Abdominal   Peds  Hematology negative hematology ROS (+)   Anesthesia Other Findings ALL: cyclobenzaprine, indomethacin  Reproductive/Obstetrics                             Anesthesia Physical Anesthesia Plan  ASA: 3  Anesthesia Plan: General   Post-op Pain Management: Minimal or no pain anticipated   Induction: Intravenous  PONV Risk Score and Plan: 2 and TIVA and Treatment may vary due to age or medical condition  Airway Management Planned: Natural Airway and Nasal Cannula  Additional Equipment: None  Intra-op Plan:   Post-operative Plan:   Informed Consent:     Dental advisory given  Plan Discussed with:   Anesthesia Plan Comments:         Anesthesia Quick Evaluation

## 2021-11-03 ENCOUNTER — Encounter (HOSPITAL_COMMUNITY): Admission: RE | Payer: Self-pay | Source: Home / Self Care

## 2021-11-03 ENCOUNTER — Ambulatory Visit (HOSPITAL_COMMUNITY)
Admission: RE | Admit: 2021-11-03 | Discharge: 2021-11-03 | Disposition: A | Discharge status: Home / Self Care | Payer: Medicare Other | Source: Ambulatory Visit | Attending: Physician Assistant | Admitting: Physician Assistant

## 2021-11-03 ENCOUNTER — Ambulatory Visit (HOSPITAL_COMMUNITY): Admission: RE | Admit: 2021-11-03 | Payer: Medicare Other | Source: Home / Self Care | Admitting: Cardiology

## 2021-11-03 VITALS — HR 55

## 2021-11-03 DIAGNOSIS — I480 Paroxysmal atrial fibrillation: Secondary | ICD-10-CM | POA: Insufficient documentation

## 2021-11-03 DIAGNOSIS — Z538 Procedure and treatment not carried out for other reasons: Secondary | ICD-10-CM | POA: Diagnosis not present

## 2021-11-03 SURGERY — CARDIOVERSION
Anesthesia: Monitor Anesthesia Care

## 2021-11-03 NOTE — Progress Notes (Signed)
Patient returns for ECG today prior to DCCV. He felt he was back in SR. ECG confirms SB HR 55, 1st degree AV block, PR 264, QRS 98, QTc 455. DCCV cancelled. F/u in the AF clinic in one month for dofetilide monitoring.

## 2021-11-10 ENCOUNTER — Ambulatory Visit (HOSPITAL_COMMUNITY): Payer: Medicare Other | Admitting: Physician Assistant

## 2021-11-14 ENCOUNTER — Ambulatory Visit: Payer: Medicare Other | Admitting: Physician Assistant

## 2021-11-14 NOTE — Progress Notes (Unsigned)
Cardiology Office Note   Date:  11/15/2021   ID:  Jonathon Stevens, DOB Jul 28, 1945, MRN [id number]  PCP:  Barbie Banner, MD    No chief complaint on file.  AFib  Wt Readings from Last 3 Encounters:  11/15/21 189 lb (85.7 kg)  10/28/21 190 lb 12.8 oz (86.5 kg)  10/27/21 192 lb 6.4 oz (87.3 kg)       History of Present Illness: Jonathon Stevens is a 76 y.o. male  who had a CVA.  He had AFib dagnosed in the hospital at that time.  He was started on anticoagulation with Xarelto at that time.   Much of the neurologic symptoms that he had at the time of his CVA have resolved.   Of note, there was a question of a mildly decreased left ventricular ejection fraction. The TEE on the following day in the hospital showed normal left ventricular ejection fraction.  He did not have any ischemia evaluation due to lack of symptoms and resolution of LV dysfunction. This may have been tachycardia mediated.     He was admitted for COVID pneumonia in Jan 2021.  He's improved with steroids, remdesivir, and actemra as well as antibiotics.  Metoprolol was uptitrated for RVR during hospitalization.  Doing well on RA at discharge.   See below   Hospital Course:  Acute Hypoxic Respiratory Failure 2/2 COVID 19 Pneumonia S/p remdesivir, dex S/p ceftriaxone x 5 days S/p actemra on 1/6 CRP improved today I/O, daily weights IS, prone as able, OOB Atrial Fibrillation with RVR: increase metop to 100 mg BID, follow response.  Continue xarelto.  Continue, follow outpatient.     Acute Metabolic Encephalopathy: With visual hallucinations here CT head without acute findings Likely related to acute illness, hospitalization, and steroids Continue seroquel for now until discharge Improved   Transaminitis Due to COVID-19.  Has improved.  Will need follow-up in the outpatient setting.   Hyponatremia Improved.  Continue to monitor..   Diabetes HbA1c 6.6.  Elevated CBGs most likely due to steroids.   Patient was started on Lantus.  SSI being continued. Needs outpatient follow up for diabetes.   Gout Continue allopurinol.   History of stroke On anticoagulation for stroke prevention.  PT and OT evaluation.  Home health recommended with supervision  Staph epidermidis UTI: likely contaminant"   He was sent home on 100 mg Metoprolol BID.  Mild dizziness after taking the higher dose.  Was on 12.5 mg before COVID).   He had post COVID fatigue.  He has also been vaccinated.     06/2019 CT scan:  "Sub-6 mm pulmonary nodules in the left upper lobe. Consider follow-up evaluation with CT chest in 12 months to assess for stability.  2.  Background of advanced destructive centrilobular and substantial paraseptal emphysema.  3.  Bilateral lower lobe predominant subpleural reticulations with interlobular septal thickening and traction bronchiectasis, favoring fibrotic interstitial lung disease. CT features most consistent with "indeterminate" pattern of usual interstitial pneumonia according to Diagnostic criteria for idiopathic pulmonary fibrosis: A Fleischner Society white paper. Lancet RespiratoryMedicine 2018; [id number]. Consider pulmonology consult."     Saw pulmonary at [facility]. He was given an inhaler but it made no difference.  Fatigue has persisted.  Swims for exercise, but endurance has decreased.  He is followed in the A-fib clinic: "Patient is on Xarelto for a CHADS2VASC score of 6. He wore a cardiac monitor which showed 100% afib burden but also did have VT (22 beats) and  pauses up to 10.5 seconds. He was seen by Dr Lovena Le 09/23/21 who recommended dofetilide.    Patient is s/p dofetilide loading 7/18-7/21/23 with DCCV on 10/13/21. He was in rate controlled atrial flutter on follow up but was symptomatically improved.    On 10/28/21 patient reports that he had been doing very well until yesterday morning when he noted more fatigue. He checked his smart watch and it has shown afib ever since.  Smart watch strips prior to that showed SR (personally reviewed). He is disappointed he is back out of rhythm. "  Denies : Chest pain. Dizziness. Leg edema. Nitroglycerin use. Orthopnea. Palpitations. Paroxysmal nocturnal dyspnea. Shortness of breath. Syncope.    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 ECHO 10/08/13: EF 45-50%, mild hypokinesis of apical myocardium. Mild LA dilation, mild RA dilation;  b.  TEE (10/09/13):  EF 50-55%, normal wall motion, no LAA clot   Hypertension    Stroke [facility])     Past Surgical History:  Procedure Laterality Date   CARDIOVERSION N/A 10/13/2021   Procedure: CARDIOVERSION;  Surgeon: Berniece Salines, DO;  Location: [place];  Service: Cardiovascular;  Laterality: N/A;   ESOPHAGOGASTRODUODENOSCOPY (EGD) WITH PROPOFOL N/A 02/14/2021   Procedure: ESOPHAGOGASTRODUODENOSCOPY (EGD) WITH PROPOFOL;  Surgeon: Carol Ada, MD;  Location: [place];  Service: Endoscopy;  Laterality: N/A;   HEMOSTASIS CLIP PLACEMENT  02/14/2021   Procedure: HEMOSTASIS CLIP PLACEMENT;  Surgeon: Carol Ada, MD;  Location: [place];  Service: Endoscopy;;   TEE WITHOUT CARDIOVERSION N/A 10/09/2013   Procedure: TRANSESOPHAGEAL ECHOCARDIOGRAM (TEE);  Surgeon: Pixie Casino, MD;  Location: [facility] ENDOSCOPY;  Service: Cardiovascular;  Laterality: N/A;     Current Outpatient Medications  Medication Sig Dispense Refill   acetaminophen (TYLENOL) 650 MG CR tablet Take 1,300 mg by mouth every 8 (eight) hours as needed for pain.     allopurinol (ZYLOPRIM) 300 MG tablet Take 150 mg by mouth daily after breakfast.     atorvastatin (LIPITOR) 10 MG tablet Take 1 tablet (10 mg total) by mouth daily. 90 tablet 3   blood glucose meter kit and supplies KIT Dispense based on patient and insurance preference. Use  up to four times daily as directed. (FOR ICD-9 250.00, 250.01). 1 each 0   Calcium Carb-Magnesium Carb 80-115 MG TABS Take 1 tablet by mouth daily.     Cholecalciferol (VITAMIN D3) 125 MCG (5000 UT) CAPS Take 1 capsule by mouth daily.     COENZYME Q10 PO Take 1 tablet by mouth daily.      dofetilide (TIKOSYN) 250 MCG capsule Take 1 capsule (250 mcg total) by mouth 2 (two) times daily. 60 capsule 3   doxycycline (MONODOX) 100 MG capsule Take 100 mg by mouth 2 (two) times daily.     GLUCOSAMINE-CHONDROITIN PO Take 1 tablet by mouth daily.      Magnesium 250 MG TABS Take 1 tablet by mouth daily.     Multiple Vitamin (MULTIVITAMIN) tablet Take 1 tablet by mouth daily.     pantoprazole (PROTONIX) 40 MG tablet Take 1 tablet (40 mg total) by mouth daily. 30 tablet 1   predniSONE (DELTASONE) 20 MG tablet Take 20 mg by mouth daily.     PROLIA 60 MG/ML SOSY injection Inject 60 mg  into the skin every 6 (six) months.     XARELTO 20 MG TABS tablet TAKE 1 TABLET(20 MG) BY MOUTH DAILY 90 tablet 1   No current facility-administered medications for this visit.    Allergies:   Cyclobenzaprine and Indomethacin    Social History:  The patient  reports that he quit smoking about 6 years ago. His smoking use included cigarettes. He has a 7.50 pack-year smoking history. He has never been exposed to tobacco smoke. He has never used smokeless tobacco. He reports that he does not drink alcohol and does not use drugs.   Family History:  The patient's family history includes Hypertension in his father and mother; Stroke in his mother.    ROS:  Please see the history of present illness.   Otherwise, review of systems are positive for high HR.   All other systems are reviewed and negative.    PHYSICAL EXAM: VS:  BP 110/80 (BP Location: Left Arm, Patient Position: Sitting, Cuff Size: Normal)   Pulse (!) 118   Ht $R'5\' 10"'cr$  (1.778 m)   Wt 189 lb (85.7 kg)   BMI 27.12 kg/m  , BMI Body mass index is 27.12 kg/m. GEN:  Well nourished, well developed, in no acute distress HEENT: normal Neck: no JVD, carotid bruits, or masses Cardiac: tachycardic; no murmurs, rubs, or gallops,no edema  Respiratory:  clear to auscultation bilaterally, normal work of breathing GI: soft, nontender, nondistended, + BS MS: no deformity or atrophy Skin: warm and dry, no rash Neuro:  Strength and sensation are intact Psych: euthymic mood, full affect   EKG:   The ekg ordered today demonstrates atrial flutter, 2:1 conduction   Recent Labs: 02/12/2021: B Natriuretic Peptide 45.4 02/13/2021: TSH 1.247 05/16/2021: ALT 25 10/28/2021: BUN 11; Creatinine, Ser 1.34; Hemoglobin 15.3; Magnesium 2.1; Platelets 168; Potassium 4.0; Sodium 137   Lipid Panel    Component Value Date/Time   CHOL 135 04/18/2021 0813   TRIG 121 04/18/2021 0813   HDL 47 04/18/2021 0813   CHOLHDL 2.9 04/18/2021 0813   CHOLHDL 4.3 10/08/2013 0241   VLDL 23 10/08/2013 0241   LDLCALC 66 04/18/2021 0813     Other studies Reviewed: Additional studies/ records that were reviewed today with results demonstrating: Labs and records reviewed.   ASSESSMENT AND PLAN:  AFib/flutter: Cardioversion was again scheduled for recurrent atrial fibrillation after dofetilide was started.  He has converted back to NSR on his own. He now feels a regular HR, but HR were > 100.  Now he is in atrial flutter- asymptomatic. Will schedule DCCV. Appt in AFib clinic on 9/5.  All questions about the procedure answered.  Hyperlipidemia: LDL 66 in Jan 2023. Continue statin.  Cardiomyopathy: No CHF sx.  Feels better in NSR.   Anticoagulated: Acquired thrombophilia.  Xarelto for stroke prevention.  Should take Xarelto with the biggest meal of the day.  Emphysema/COPD: Being treated for resp illness with doxycycline.  Prediabetes: High fiber diet.  Regular exercise.    Current medicines are reviewed at length with the patient today.  The patient concerns regarding his medicines were  addressed.  The following changes have been made:  No change  Labs/ tests ordered today include: precardioversion No orders of the defined types were placed in this encounter.   Recommend 150 minutes/week of aerobic exercise Low fat, low carb, high fiber diet recommended  Disposition:   FU in 1 year   Signed, Larae Grooms, MD  11/15/2021 11:49 AM    [facility] [place], [place], [place]  [address] Phone: [phone]; Fax: [phone]

## 2021-11-15 ENCOUNTER — Ambulatory Visit (INDEPENDENT_AMBULATORY_CARE_PROVIDER_SITE_OTHER): Payer: Medicare Other | Admitting: Interventional Cardiology

## 2021-11-15 ENCOUNTER — Encounter: Payer: Self-pay | Admitting: Interventional Cardiology

## 2021-11-15 VITALS — BP 110/80 | HR 118 | Ht 70.0 in | Wt 189.0 lb

## 2021-11-15 DIAGNOSIS — R7303 Prediabetes: Secondary | ICD-10-CM | POA: Diagnosis not present

## 2021-11-15 DIAGNOSIS — I4819 Other persistent atrial fibrillation: Secondary | ICD-10-CM | POA: Diagnosis not present

## 2021-11-15 DIAGNOSIS — D6869 Other thrombophilia: Secondary | ICD-10-CM | POA: Diagnosis not present

## 2021-11-15 DIAGNOSIS — E785 Hyperlipidemia, unspecified: Secondary | ICD-10-CM | POA: Diagnosis not present

## 2021-11-15 DIAGNOSIS — J441 Chronic obstructive pulmonary disease with (acute) exacerbation: Secondary | ICD-10-CM

## 2021-11-15 NOTE — Patient Instructions (Addendum)
Medication Instructions:  Your physician recommends that you continue on your current medications as directed. Please refer to the Current Medication list given to you today.  *If you need a refill on your cardiac medications before your next appointment, please call your pharmacy*   Lab Work: none If you have labs (blood work) drawn today and your tests are completely normal, you will receive your results only by: MyChart Message (if you have MyChart) OR A paper copy in the mail If you have any lab test that is abnormal or we need to change your treatment, we will call you to review the results.   Testing/Procedures: Your physician has recommended that you have a Cardioversion (DCCV). Electrical Cardioversion uses a jolt of electricity to your heart either through paddles or wired patches attached to your chest. This is a controlled, usually prescheduled, procedure. Defibrillation is done under light anesthesia in the hospital, and you usually go home the day of the procedure. This is done to get your heart back into a normal rhythm. You are not awake for the procedure. Please see the instruction sheet given to you today. Scheduled for August 31   Follow-Up: At [facility], you and your health needs are our priority.  As part of our continuing mission to provide you with exceptional heart care, we have created designated Provider Care Teams.  These Care Teams include your primary Cardiologist (physician) and Advanced Practice Providers (APPs -  Physician Assistants and Nurse Practitioners) who all work together to provide you with the care you need, when you need it.  We recommend signing up for the patient portal called "MyChart".  Sign up information is provided on this After Visit Summary.  MyChart is used to connect with patients for Virtual Visits (Telemedicine).  Patients are able to view lab/test results, encounter notes, upcoming appointments, etc.  Non-urgent messages can be sent  to your provider as well.   To learn more about what you can do with MyChart, go to [email or web address].    Your next appointment:   September 5  The format for your next appointment:   In Person  Provider:   You will follow up in the Atrial Fibrillation Clinic located at [facility]. Your provider will be: Clint R. Fenton, PA-C    Other Instructions   You are scheduled for a  Cardioversion on August 31,2023 with Dr. Tenny Craw.  Please arrive at the [facility] (Main Entrance A) at [facility]: [address] [place], [place] [address] at 7:30 am/  DIET: Nothing to eat or drink after midnight except a sip of water with medications (see medication instructions below)  FYI: For your safety, and to allow [place] to monitor your vital signs accurately during the surgery/procedure we request that   if you have artificial nails, gel coating, SNS etc. Please have those removed prior to your surgery/procedure. Not having the nail coverings /polish removed may result in cancellation or delay of your surgery/procedure.   Medication Instructions:   Continue your anticoagulant: Xarelto   You will need to continue your anticoagulant after your procedure until you  are told by your  Provider that it is safe to stop   Labs:     your lab work will be done at the hospital prior to your procedure -   You must have a responsible person to drive you home and stay in the waiting area during your procedure. Failure to do so could result in cancellation.  Bring  your insurance cards.  *Special Note: Every effort is made to have your procedure done on time. Occasionally there are emergencies that occur at the hospital that may cause delays. Please be patient if a delay does occur.    Important Information About Sugar

## 2021-11-17 ENCOUNTER — Encounter (HOSPITAL_COMMUNITY): Payer: Self-pay | Admitting: Internal Medicine

## 2021-11-21 ENCOUNTER — Ambulatory Visit (HOSPITAL_COMMUNITY)
Admission: RE | Admit: 2021-11-21 | Discharge: 2021-11-21 | Disposition: A | Discharge status: Home / Self Care | Payer: Medicare Other | Source: Ambulatory Visit | Attending: Physician Assistant | Admitting: Physician Assistant

## 2021-11-21 DIAGNOSIS — I484 Atypical atrial flutter: Secondary | ICD-10-CM | POA: Insufficient documentation

## 2021-11-21 DIAGNOSIS — D6869 Other thrombophilia: Secondary | ICD-10-CM

## 2021-11-21 NOTE — Progress Notes (Signed)
Patient presents for ECG today, he felt he was back in SR. ECG shows he is still in atypical atrial flutter with 2:1 block HR 116, QRS 88, QTc 486. Will have him proceed with DCCV and follow up as scheduled.

## 2021-11-23 ENCOUNTER — Other Ambulatory Visit: Payer: Self-pay | Admitting: Interventional Cardiology

## 2021-11-23 DIAGNOSIS — I483 Typical atrial flutter: Secondary | ICD-10-CM

## 2021-11-24 ENCOUNTER — Ambulatory Visit (HOSPITAL_COMMUNITY)
Admission: RE | Admit: 2021-11-24 | Discharge: 2021-11-24 | Disposition: A | Discharge status: Home / Self Care | Payer: Medicare Other | Attending: Internal Medicine | Admitting: Internal Medicine

## 2021-11-24 ENCOUNTER — Encounter (HOSPITAL_COMMUNITY)
Admission: RE | Disposition: A | Discharge status: Home / Self Care | Payer: Self-pay | Source: Home / Self Care | Attending: Internal Medicine

## 2021-11-24 ENCOUNTER — Encounter (HOSPITAL_COMMUNITY): Payer: Self-pay | Admitting: Anesthesiology

## 2021-11-24 ENCOUNTER — Encounter (HOSPITAL_COMMUNITY): Payer: Self-pay | Admitting: Internal Medicine

## 2021-11-24 DIAGNOSIS — I4891 Unspecified atrial fibrillation: Secondary | ICD-10-CM | POA: Insufficient documentation

## 2021-11-24 DIAGNOSIS — Z538 Procedure and treatment not carried out for other reasons: Secondary | ICD-10-CM | POA: Diagnosis not present

## 2021-11-24 DIAGNOSIS — I483 Typical atrial flutter: Secondary | ICD-10-CM

## 2021-11-24 SURGERY — CANCELLED PROCEDURE

## 2021-11-24 MED ORDER — SODIUM CHLORIDE 0.9 % IV SOLN: INTRAVENOUS | Status: DC

## 2021-11-24 MED ORDER — HYDROCORTISONE 1 % EX CREA: 1.0000 | TOPICAL_CREAM | Freq: Three times a day (TID) | CUTANEOUS | Status: DC | PRN

## 2021-11-24 NOTE — Progress Notes (Signed)
Procedure cancelled due to patients heart rhythm in sinus rhythm. MD assessed patient and reviewed EKG. Patient notified of results by MD.

## 2021-11-24 NOTE — Progress Notes (Signed)
   Pt presented for DC cardioversion EKG / tele showed sinus bradycardia    No procedure performed Pt will follow up in clinic   Dietrich Pates MD

## 2021-11-29 ENCOUNTER — Ambulatory Visit (HOSPITAL_COMMUNITY)
Admission: RE | Admit: 2021-11-29 | Discharge: 2021-11-29 | Disposition: A | Discharge status: Home / Self Care | Payer: Medicare Other | Source: Ambulatory Visit | Attending: Physician Assistant | Admitting: Physician Assistant

## 2021-11-29 ENCOUNTER — Encounter (HOSPITAL_COMMUNITY): Payer: Self-pay | Admitting: Physician Assistant

## 2021-11-29 VITALS — BP 140/72 | HR 53 | Ht 70.0 in | Wt 190.2 lb

## 2021-11-29 DIAGNOSIS — J449 Chronic obstructive pulmonary disease, unspecified: Secondary | ICD-10-CM | POA: Diagnosis not present

## 2021-11-29 DIAGNOSIS — I2510 Atherosclerotic heart disease of native coronary artery without angina pectoris: Secondary | ICD-10-CM | POA: Insufficient documentation

## 2021-11-29 DIAGNOSIS — J8410 Pulmonary fibrosis, unspecified: Secondary | ICD-10-CM | POA: Diagnosis not present

## 2021-11-29 DIAGNOSIS — D6869 Other thrombophilia: Secondary | ICD-10-CM | POA: Diagnosis not present

## 2021-11-29 DIAGNOSIS — I4819 Other persistent atrial fibrillation: Secondary | ICD-10-CM

## 2021-11-29 DIAGNOSIS — I4892 Unspecified atrial flutter: Secondary | ICD-10-CM | POA: Diagnosis not present

## 2021-11-29 DIAGNOSIS — E785 Hyperlipidemia, unspecified: Secondary | ICD-10-CM | POA: Insufficient documentation

## 2021-11-29 DIAGNOSIS — Z7901 Long term (current) use of anticoagulants: Secondary | ICD-10-CM | POA: Insufficient documentation

## 2021-11-29 DIAGNOSIS — Z8673 Personal history of transient ischemic attack (TIA), and cerebral infarction without residual deficits: Secondary | ICD-10-CM | POA: Diagnosis not present

## 2021-11-29 DIAGNOSIS — I10 Essential (primary) hypertension: Secondary | ICD-10-CM | POA: Diagnosis not present

## 2021-11-29 LAB — BASIC METABOLIC PANEL
Anion gap: 9 (ref 5–15)
BUN: 16 mg/dL (ref 8–23)
CO2: 23 mmol/L (ref 22–32)
Calcium: 9.3 mg/dL (ref 8.9–10.3)
Chloride: 103 mmol/L (ref 98–111)
Creatinine, Ser: 1.31 mg/dL — ABNORMAL HIGH (ref 0.61–1.24)
GFR, Estimated: 56 mL/min — ABNORMAL LOW (ref >60)
Glucose, Bld: 233 mg/dL — ABNORMAL HIGH (ref 70–99)
Potassium: 4.3 mmol/L (ref 3.5–5.1)
Sodium: 135 mmol/L (ref 135–145)

## 2021-11-29 LAB — MAGNESIUM: Magnesium: 2 mg/dL (ref 1.7–2.4)

## 2021-11-29 NOTE — Progress Notes (Signed)
Primary Care Physician: Christain Sacramento, MD Primary Cardiologist: Dr Irish Lack  Primary Electrophysiologist: Dr Lovena Le Referring Physician: Dr Lovena Le   Jonathon Stevens is a 76 y.o. male with a history of CVA, AAA, right common iliac aneurysm, HLD, pulmonary fibrosis, COPD, CKD, esophageal bleed s/p clipping, atrial flutter, atrial fibrillation who presents for follow up in the [facility]. Patient is on Xarelto for a CHADS2VASC score of 6. He wore a cardiac monitor which showed 100% afib burden but also did have VT (22 beats) and pauses up to 10.5 seconds. He was seen by Dr Lovena Le 09/23/21 who recommended dofetilide.   Patient is s/p dofetilide loading 7/18-7/21/23 with DCCV on 10/13/21. He was in rate controlled atrial flutter on follow up but was symptomatically improved. Patient has continued to go in and out of rhythm since starting dofetilide. He has been set up for DCCV twice but converted to SR prior to each.   On follow up today, patient remains in [place]. No bleeding issues on anticoagulation. He feels well and is tolerating the medication without difficulty.   Today, he denies symptoms of palpitations, chest pain, orthopnea, PND, lower extremity edema, dizziness, presyncope, syncope, snoring, daytime somnolence, bleeding, or neurologic sequela. The patient is tolerating medications without difficulties and is otherwise without complaint today.    Atrial Fibrillation Risk Factors:  he does not have symptoms or diagnosis of sleep apnea. he does not have a history of rheumatic fever.   he has a BMI of Body mass index is 27.29 kg/m.Marland Kitchen Filed Weights   11/29/21 0922  Weight: 86.3 kg    Family History  Problem Relation Age of Onset   Hypertension Mother    Stroke Mother    Hypertension Father    Heart attack Neg Hx      Atrial Fibrillation Management history:  Previous antiarrhythmic drugs: dofetilide Previous cardioversions: 10/13/21 Previous  ablations: none CHADS2VASC score: 6 Anticoagulation history: Xarelto    Past Medical History:  Diagnosis Date   Atrial flutter (HCC)    CVA (cerebral infarction)    embolic CVA 03/8297   Diabetes mellitus without complication (HCC)    Gout    HLD (hyperlipidemia)    Hx of echocardiogram    a. ECHO 10/08/13: EF 45-50%, mild hypokinesis of apical myocardium. Mild LA dilation, mild RA dilation;  b.  TEE (10/09/13):  EF 50-55%, normal wall motion, no LAA clot   Hypertension    Stroke [facility])    Past Surgical History:  Procedure Laterality Date   CARDIOVERSION N/A 10/13/2021   Procedure: CARDIOVERSION;  Surgeon: Berniece Salines, DO;  Location: [place];  Service: Cardiovascular;  Laterality: N/A;   ESOPHAGOGASTRODUODENOSCOPY (EGD) WITH PROPOFOL N/A 02/14/2021   Procedure: ESOPHAGOGASTRODUODENOSCOPY (EGD) WITH PROPOFOL;  Surgeon: Carol Ada, MD;  Location: [place];  Service: Endoscopy;  Laterality: N/A;   HEMOSTASIS CLIP PLACEMENT  02/14/2021   Procedure: HEMOSTASIS CLIP PLACEMENT;  Surgeon: Carol Ada, MD;  Location: [place];  Service: Endoscopy;;   TEE WITHOUT CARDIOVERSION N/A 10/09/2013   Procedure: TRANSESOPHAGEAL ECHOCARDIOGRAM (TEE);  Surgeon: Pixie Casino, MD;  Location: [facility] ENDOSCOPY;  Service: Cardiovascular;  Laterality: N/A;    Current Outpatient Medications  Medication Sig Dispense Refill   acetaminophen (TYLENOL) 650 MG CR tablet Take 1,300 mg by mouth every 8 (eight) hours as needed for pain.     allopurinol (ZYLOPRIM) 300 MG tablet Take 300 mg by mouth daily after breakfast.     atorvastatin (LIPITOR) 10 MG tablet  Take 1 tablet (10 mg total) by mouth daily. 90 tablet 3   blood glucose meter kit and supplies KIT Dispense based on patient and insurance preference. Use up to four times daily as directed. (FOR ICD-9 250.00, 250.01). 1 each 0   CALCIUM PO Take 1 tablet by mouth daily.     Cholecalciferol (VITAMIN D3) 125 MCG (5000 UT) CAPS Take 1 capsule by  mouth daily.     Coenzyme Q10 (COQ-10) 100 MG CAPS Take 100 mg by mouth daily.     dofetilide (TIKOSYN) 250 MCG capsule Take 1 capsule (250 mcg total) by mouth 2 (two) times daily. 60 capsule 3   GLUCOSAMINE-CHONDROITIN PO Take 1 tablet by mouth daily.      Magnesium 250 MG TABS Take 1 tablet by mouth daily.     Multiple Vitamin (MULTIVITAMIN) tablet Take 1 tablet by mouth daily.     pantoprazole (PROTONIX) 40 MG tablet Take 1 tablet (40 mg total) by mouth daily. 30 tablet 1   PROLIA 60 MG/ML SOSY injection Inject 60 mg into the skin every 6 (six) months.     XARELTO 20 MG TABS tablet TAKE 1 TABLET(20 MG) BY MOUTH DAILY 90 tablet 1   No current facility-administered medications for this encounter.    Allergies  Allergen Reactions   Cyclobenzaprine Nausea Only   Indomethacin Rash    Social History   Socioeconomic History   Marital status: Unknown    Spouse name: Jonathon Stevens   Number of children: 2   Years of education: college   Highest education level: Not on file  Occupational History   Occupation: retired   Tobacco Use   Smoking status: Former    Packs/day: 0.50    Years: 15.00    Total pack years: 7.50    Types: Cigarettes    Quit date: 07/13/2015    Years since quitting: 6.3    Passive exposure: Never   Smokeless tobacco: Never   Tobacco comments:    Former smoker 10/11/21  Vaping Use   Vaping Use: Never used  Substance and Sexual Activity   Alcohol use: No    Alcohol/week: 0.0 standard drinks of alcohol   Drug use: No   Sexual activity: Yes  Other Topics Concern   Not on file  Social History Narrative   Patient is married lives with Spouse Jonathon Stevens).   Patient is right handed.   Patient has a Secretary/administrator Degree   Patient drinks 1 cup of coffee daily   Social Determinants of Health   Financial Resource Strain: Not on file  Food Insecurity: Not on file  Transportation Needs: Not on file  Physical Activity: Not on file  Stress: Not on file  Social Connections: Not  on file  Intimate Partner Violence: Not on file     ROS- All systems are reviewed and negative except as per the HPI above.  Physical Exam: Vitals:   11/29/21 0922  BP: (!) 140/72  Stevens: (!) 53  Weight: 86.3 kg    GEN- The patient is a well appearing elderly male, alert and oriented x 3 today.   HEENT-head normocephalic, atraumatic, sclera clear, conjunctiva pink, hearing intact, trachea midline. Lungs- Clear to ausculation bilaterally, normal work of breathing Heart- Regular rate and rhythm, bradycardia, no murmurs, rubs or gallops  GI- soft, NT, ND, + BS Extremities- no clubbing, cyanosis, or edema MS- no significant deformity or atrophy Skin- no rash or lesion Psych- euthymic mood, full affect Neuro- strength and sensation are  intact   Wt Readings from Last 3 Encounters:  11/29/21 86.3 kg  11/15/21 85.7 kg  10/28/21 86.5 kg    EKG today demonstrates  SB, 1st degree AV block Vent. rate 53 BPM PR interval 252 ms QRS duration 82 ms QT/QTcB 484/454 ms  Echo 02/13/21 demonstrated   1. Left ventricular ejection fraction, by estimation, is 70 to 75%. The  left ventricle has hyperdynamic function. The left ventricle has no  regional wall motion abnormalities. There is mild left ventricular  hypertrophy. Left ventricular diastolic function could not be evaluated.   2. Right ventricular systolic function is normal. The right ventricular  size is normal.   3. Left atrial size was moderately dilated.   4. The mitral valve is normal in structure. Mild mitral valve  regurgitation. No evidence of mitral stenosis.   5. The aortic valve is tricuspid. Aortic valve regurgitation is not  visualized. Aortic valve sclerosis is present, with no evidence of aortic valve stenosis.   6. The inferior vena cava is normal in size with greater than 50%  respiratory variability, suggesting right atrial pressure of 3 mmHg.   Epic records are reviewed at length today  CHA2DS2-VASc Score =  6  The patient's score is based upon: CHF History: 0 HTN History: 1 Diabetes History: 0 Stroke History: 2 Vascular Disease History: 1 Age Score: 2 Gender Score: 0       ASSESSMENT AND PLAN: 1. Persistent Atrial Fibrillation/atrial flutter The patient's CHA2DS2-VASc score is 6, indicating a 9.7% annual risk of stroke.   S/p dofetilide admission 7/18-7/21/23 Patient appears to be going in and out of rhythm, in SR today. He is happy with his present therapy for now.  Continue dofetilide 250 mcg BID. QT stable. Continue Xarelto 20 mg daily Off AV nodal agents with h/o significant bradycardia/pauses.  Apple Watch for home monitoring.  2. Secondary Hypercoagulable State (ICD10:  D68.69) The patient is at significant risk for stroke/thromboembolism based upon his CHA2DS2-VASc Score of 6.  Continue Rivaroxaban (Xarelto).   3. HTN Stable, no changes today.  4. CAD On statin No anginal symptoms   Follow up with Dr Lovena Le in 3 months. AF clinic in 6 months.    [facility] [address] [place],  [address] [phone] 11/29/2021 9:47 AM

## 2021-11-29 NOTE — Addendum Note (Signed)
Encounter addended by: Learta Codding, CMA on: 11/29/2021 10:36 AM  Actions taken: Vitals modified

## 2021-12-21 ENCOUNTER — Ambulatory Visit (HOSPITAL_COMMUNITY)
Admission: RE | Admit: 2021-12-21 | Discharge: 2021-12-21 | Disposition: A | Discharge status: Home / Self Care | Payer: Medicare Other | Source: Ambulatory Visit | Attending: Physician Assistant | Admitting: Physician Assistant

## 2021-12-21 DIAGNOSIS — I440 Atrioventricular block, first degree: Secondary | ICD-10-CM | POA: Diagnosis not present

## 2021-12-21 DIAGNOSIS — I4819 Other persistent atrial fibrillation: Secondary | ICD-10-CM | POA: Diagnosis not present

## 2021-12-21 NOTE — Progress Notes (Signed)
Patient returns for ECG after accidentally taking an extra dose of dofetilide. He took his normal dose at 8 AM and took a second dose at 9 AM with his other medications. ECG shows SR HR 62, 1st degree AV block PR 258, QRS 96, QTc. QT stable. Will have patient skip his PM dose tonight and resume 250 mcg BID tomorrow. He has actually done very well from an afib standpoint recently and is not interested in ablation.  Follow up with Dr Lovena Le as scheduled.

## 2022-01-19 ENCOUNTER — Ambulatory Visit: Payer: Medicare Other | Admitting: Internal Medicine

## 2022-03-06 ENCOUNTER — Encounter: Payer: Self-pay | Admitting: Internal Medicine

## 2022-03-06 ENCOUNTER — Other Ambulatory Visit: Payer: Self-pay

## 2022-03-06 ENCOUNTER — Ambulatory Visit: Payer: Medicare Other | Attending: Internal Medicine | Admitting: Internal Medicine

## 2022-03-06 VITALS — BP 150/58 | HR 59 | Ht 70.0 in | Wt 196.0 lb

## 2022-03-06 DIAGNOSIS — I480 Paroxysmal atrial fibrillation: Secondary | ICD-10-CM | POA: Diagnosis not present

## 2022-03-06 DIAGNOSIS — I4892 Unspecified atrial flutter: Secondary | ICD-10-CM

## 2022-03-06 MED ORDER — DOFETILIDE 250 MCG PO CAPS: 250.0000 ug | ORAL_CAPSULE | Freq: Two times a day (BID) | ORAL | 3 refills | Status: DC

## 2022-03-06 NOTE — Patient Instructions (Addendum)
Medication Instructions:  Your physician recommends that you continue on your current medications as directed. Please refer to the Current Medication list given to you today.  *If you need a refill on your cardiac medications before your next appointment, please call your pharmacy*  Lab Work: None ordered.  If you have labs (blood work) drawn today and your tests are completely normal, you will receive your results only by: MyChart Message (if you have MyChart) OR A paper copy in the mail If you have any lab test that is abnormal or we need to change your treatment, we will call you to review the results.  Testing/Procedures: None ordered.  Follow-Up: At [facility], you and your health needs are our priority.  As part of our continuing mission to provide you with exceptional heart care, we have created designated Provider Care Teams.  These Care Teams include your primary Cardiologist (physician) and Advanced Practice Providers (APPs -  Physician Assistants and Nurse Practitioners) who all work together to provide you with the care you need, when you need it.  We recommend signing up for the patient portal called "MyChart".  Sign up information is provided on this After Visit Summary.  MyChart is used to connect with patients for Virtual Visits (Telemedicine).  Patients are able to view lab/test results, encounter notes, upcoming appointments, etc.  Non-urgent messages can be sent to your provider as well.   To learn more about what you can do with MyChart, go to [email or web address].    Your next appointment:   Please schedule 6 month follow up appointment with Dr. Ladona Ridgel  The format for your next appointment:   In Person  Provider:   Lewayne Bunting, MD{or one of the following Advanced Practice Providers on your designated Care Team:   Francis Dowse, [place] Casimiro Needle "Mardelle Matte" Lanna Poche, [place]   Important Information About Sugar

## 2022-03-06 NOTE — Progress Notes (Signed)
HPI Mr. 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 He is a pleasant 76 yo man who was started on dofetilide several months ago. He went from longstanding persistent fib/flutter to PAF with long periods of NSR. Since starting the dofetilide, he notes he has minimal atrial fib but still has some fatigue. He has an Apple watch and notes that his HR is usually between 55-72. No syncope. He notes that at home his bp is good.  Allergies  Allergen Reactions   Cyclobenzaprine Nausea Only   Indomethacin Rash     Current Outpatient Medications  Medication Sig Dispense Refill   acetaminophen (TYLENOL) 650 MG CR tablet Take 1,300 mg by mouth every 8 (eight) hours as needed for pain.     allopurinol (ZYLOPRIM) 300 MG tablet Take 300 mg by mouth daily after breakfast.     atorvastatin (LIPITOR) 10 MG tablet Take 1 tablet (10 mg total) by mouth daily. 90 tablet 3   blood glucose meter kit and supplies KIT Dispense based on patient and insurance preference. Use up to four times daily as directed. (FOR ICD-9 250.00, 250.01). 1 each 0   CALCIUM PO Take 1 tablet by mouth daily.     Cholecalciferol (VITAMIN D3) 125 MCG (5000 UT) CAPS Take 1 capsule by mouth daily.     Coenzyme Q10 (COQ-10) 100 MG CAPS Take 100 mg by mouth daily.     dofetilide (TIKOSYN) 250 MCG capsule Take 1 capsule (250 mcg total) by mouth 2 (two) times daily. 60 capsule 3   GLUCOSAMINE-CHONDROITIN PO Take 1 tablet by mouth daily.      Magnesium 250 MG TABS Take 1 tablet by mouth daily.     Multiple Vitamin (MULTIVITAMIN) tablet Take 1 tablet by mouth daily.     pantoprazole (PROTONIX) 40 MG tablet Take 1 tablet (40 mg total) by mouth daily. 30 tablet 1   PROLIA 60 MG/ML SOSY injection Inject 60 mg into the skin every 6 (six) months.     XARELTO 20 MG TABS tablet TAKE 1 TABLET(20 MG) BY MOUTH DAILY 90 tablet 1   No current facility-administered medications for  this visit.     Past Medical History:  Diagnosis Date   Atrial flutter ([place])    CVA (cerebral infarction)    embolic CVA 03/6107   Diabetes mellitus without complication (HCC)    Gout    HLD (hyperlipidemia)    Hx of echocardiogram    a. ECHO 10/08/13: EF 45-50%, mild hypokinesis of apical myocardium. Mild LA dilation, mild RA dilation;  b.  TEE (10/09/13):  EF 50-55%, normal wall motion, no LAA clot   Hypertension    Stroke ([place])     ROS:   All systems reviewed and negative except as noted in the HPI.   Past Surgical History:  Procedure Laterality Date   CARDIOVERSION N/A 10/13/2021   Procedure: CARDIOVERSION;  Surgeon: Berniece Salines, DO;  Location: [place] ENDOSCOPY;  Service: Cardiovascular;  Laterality: N/A;   ESOPHAGOGASTRODUODENOSCOPY (EGD) WITH PROPOFOL N/A 02/14/2021   Procedure: ESOPHAGOGASTRODUODENOSCOPY (EGD) WITH PROPOFOL;  Surgeon: Carol Ada, MD;  Location: [place];  Service: Endoscopy;  Laterality: N/A;   HEMOSTASIS CLIP PLACEMENT  02/14/2021   Procedure: HEMOSTASIS CLIP PLACEMENT;  Surgeon: Carol Ada, MD;  Location: [place];  Service: Endoscopy;;   TEE WITHOUT CARDIOVERSION N/A 10/09/2013   Procedure: TRANSESOPHAGEAL ECHOCARDIOGRAM (TEE);  Surgeon: Pixie Casino, MD;  Location[facility] ENDOSCOPY;  Service: Cardiovascular;  Laterality: N/A;     Family History  Problem Relation Age of Onset   Hypertension Mother    Stroke Mother    Hypertension Father    Heart attack Neg Hx      Social History   Socioeconomic History   Marital status: Unknown    Spouse name: [name]   Number of children: 2   Years of education: college   Highest education level: Not on file  Occupational History   Occupation: retired   Tobacco Use   Smoking status: Former    Packs/day: 0.50    Years: 15.00    Total pack years: 7.50    Types: Cigarettes    Quit date: 07/13/2015    Years since quitting: 6.6    Passive exposure: Never   Smokeless tobacco: Never   Tobacco  comments:    Former smoker 10/11/21  Vaping Use   Vaping Use: Never used  Substance and Sexual Activity   Alcohol use: No    Alcohol/week: 0.0 standard drinks of alcohol   Drug use: No   Sexual activity: Yes  Other Topics Concern   Not on file  Social History Narrative   Patient is married lives with Spouse [name]).   Patient is right handed.   Patient has a Gaffer   Patient drinks 1 cup of coffee daily   Social Determinants of Health   Financial Resource Strain: Not on file  Food Insecurity: Not on file  Transportation Needs: Not on file  Physical Activity: Not on file  Stress: Not on file  Social Connections: Not on file  Intimate Partner Violence: Not on file     BP (!) 170/82   Pulse (!) 59   Ht _0  (1.778 m)   Wt 196 lb (88.9 kg)   SpO2 95%   BMI 28.12 kg/m   Physical Exam:  Well appearing NAD HEENT: Unremarkable Neck:  No JVD, no thyromegally Lymphatics:  No adenopathy Back:  No CVA tenderness Lungs:  Clear HEART:  Regular rate rhythm, no murmurs, no rubs, no clicks Abd:  soft, positive bowel sounds, no organomegally, no rebound, no guarding Ext:  2 plus pulses, no edema, no cyanosis, no clubbing Skin:  No rashes no nodules Neuro:  CN II through XII intact, motor grossly intact  EKG - nsr with first degree AV block   Assess/Plan:  Persistent atrial fib- I have discussed the treatment optiosn with the patient and recommended either dofetilide vs AV node RFA and PPM insertion. The risks/benefits/of each approach were reviewed and he would like to proceed with dofetilide. Long pauses - I suspect that he will eventualy require back up pacing. We will follow. He is currently asymptomatic. HTN - his bp at home is usually well controlled. Repeat here is 150. I asked him to call if his home bp's increase. Dyslipidemia - he will continue his statin therapy.  Carleene Overlie Ratasha Fabre,MD

## 2022-03-30 ENCOUNTER — Other Ambulatory Visit: Payer: Self-pay | Admitting: Internal Medicine

## 2022-03-30 DIAGNOSIS — I480 Paroxysmal atrial fibrillation: Secondary | ICD-10-CM

## 2022-03-30 NOTE — Telephone Encounter (Signed)
Xarelto 20mg  refill request received. Pt is 77 years old, weight-88.9kg, Crea-1.31 on 11/29/21, last seen by Dr. Lovena Le on 03/06/2022, Diagnosis-Afib/flutter, CrCl- 60.32 mL/min; Dose is appropriate based on dosing criteria. Will send in refill to requested pharmacy.

## 2022-04-27 ENCOUNTER — Encounter (HOSPITAL_COMMUNITY): Payer: Self-pay | Admitting: Physician Assistant

## 2022-04-27 ENCOUNTER — Ambulatory Visit (HOSPITAL_COMMUNITY)
Admission: RE | Admit: 2022-04-27 | Discharge: 2022-04-27 | Disposition: A | Discharge status: Home / Self Care | Payer: Medicare Other | Source: Ambulatory Visit | Attending: Physician Assistant | Admitting: Physician Assistant

## 2022-04-27 VITALS — BP 148/90 | HR 66 | Ht 70.0 in | Wt 195.4 lb

## 2022-04-27 DIAGNOSIS — N189 Chronic kidney disease, unspecified: Secondary | ICD-10-CM | POA: Diagnosis not present

## 2022-04-27 DIAGNOSIS — I129 Hypertensive chronic kidney disease with stage 1 through stage 4 chronic kidney disease, or unspecified chronic kidney disease: Secondary | ICD-10-CM | POA: Insufficient documentation

## 2022-04-27 DIAGNOSIS — I2510 Atherosclerotic heart disease of native coronary artery without angina pectoris: Secondary | ICD-10-CM | POA: Diagnosis not present

## 2022-04-27 DIAGNOSIS — I480 Paroxysmal atrial fibrillation: Secondary | ICD-10-CM

## 2022-04-27 DIAGNOSIS — I4819 Other persistent atrial fibrillation: Secondary | ICD-10-CM | POA: Diagnosis not present

## 2022-04-27 DIAGNOSIS — Z8249 Family history of ischemic heart disease and other diseases of the circulatory system: Secondary | ICD-10-CM | POA: Insufficient documentation

## 2022-04-27 DIAGNOSIS — I4892 Unspecified atrial flutter: Secondary | ICD-10-CM | POA: Diagnosis not present

## 2022-04-27 DIAGNOSIS — Z8673 Personal history of transient ischemic attack (TIA), and cerebral infarction without residual deficits: Secondary | ICD-10-CM | POA: Diagnosis not present

## 2022-04-27 DIAGNOSIS — E785 Hyperlipidemia, unspecified: Secondary | ICD-10-CM | POA: Insufficient documentation

## 2022-04-27 DIAGNOSIS — J8410 Pulmonary fibrosis, unspecified: Secondary | ICD-10-CM | POA: Insufficient documentation

## 2022-04-27 DIAGNOSIS — Z79899 Other long term (current) drug therapy: Secondary | ICD-10-CM | POA: Diagnosis not present

## 2022-04-27 DIAGNOSIS — Z7901 Long term (current) use of anticoagulants: Secondary | ICD-10-CM | POA: Diagnosis not present

## 2022-04-27 DIAGNOSIS — D6869 Other thrombophilia: Secondary | ICD-10-CM

## 2022-04-27 DIAGNOSIS — Z87891 Personal history of nicotine dependence: Secondary | ICD-10-CM | POA: Insufficient documentation

## 2022-04-27 NOTE — Progress Notes (Signed)
Primary Care Physician: Christain Sacramento, MD Primary Cardiologist: Dr Irish Lack  Primary Electrophysiologist: Dr Lovena Le Referring Physician: Dr Lovena Le   Jonathon Stevens is a 77 y.o. male with a history of CVA, AAA, right common iliac aneurysm, HLD, pulmonary fibrosis, COPD, CKD, esophageal bleed s/p clipping, atrial flutter, atrial fibrillation who presents for follow up in the [facility]. Patient is on Xarelto for a CHADS2VASC score of 6. He wore a cardiac monitor which showed 100% afib burden but also did have VT (22 beats) and pauses up to 10.5 seconds. He was seen by Dr Lovena Le 09/23/21 who recommended dofetilide.   Patient is s/p dofetilide loading 7/18-7/21/23 with DCCV on 10/13/21. He was in rate controlled atrial flutter on follow up but was symptomatically improved. Patient has continued to go in and out of rhythm since starting dofetilide. He has been set up for DCCV twice but converted to SR prior to each.   On follow up today, patient reports that over the past 3-4 weeks his afib burden has increased. His smart watch shows a 61% burden. He has been much more fatigued. He has been dealing with some family stressors lately but no other specific triggers for his afib.   Today, he denies symptoms of palpitations, chest pain, orthopnea, PND, lower extremity edema, dizziness, presyncope, syncope, snoring, daytime somnolence, bleeding, or neurologic sequela. The patient is tolerating medications without difficulties and is otherwise without complaint today.    Atrial Fibrillation Risk Factors:  he does not have symptoms or diagnosis of sleep apnea. he does not have a history of rheumatic fever.   he has a BMI of Body mass index is 28.04 kg/m.Marland Kitchen Filed Weights   04/27/22 1526  Weight: 88.6 kg    Family History  Problem Relation Age of Onset   Hypertension Mother    Stroke Mother    Hypertension Father    Heart attack Neg Hx      Atrial Fibrillation  Management history:  Previous antiarrhythmic drugs: dofetilide Previous cardioversions: 10/13/21 Previous ablations: none CHADS2VASC score: 6 Anticoagulation history: Xarelto    Past Medical History:  Diagnosis Date   Atrial flutter (HCC)    CVA (cerebral infarction)    embolic CVA 07/9561   Diabetes mellitus without complication (HCC)    Gout    HLD (hyperlipidemia)    Hx of echocardiogram    a. ECHO 10/08/13: EF 45-50%, mild hypokinesis of apical myocardium. Mild LA dilation, mild RA dilation;  b.  TEE (10/09/13):  EF 50-55%, normal wall motion, no LAA clot   Hypertension    Stroke [facility])    Past Surgical History:  Procedure Laterality Date   CARDIOVERSION N/A 10/13/2021   Procedure: CARDIOVERSION;  Surgeon: Berniece Salines, DO;  Location: [place];  Service: Cardiovascular;  Laterality: N/A;   ESOPHAGOGASTRODUODENOSCOPY (EGD) WITH PROPOFOL N/A 02/14/2021   Procedure: ESOPHAGOGASTRODUODENOSCOPY (EGD) WITH PROPOFOL;  Surgeon: Carol Ada, MD;  Location: [place];  Service: Endoscopy;  Laterality: N/A;   HEMOSTASIS CLIP PLACEMENT  02/14/2021   Procedure: HEMOSTASIS CLIP PLACEMENT;  Surgeon: Carol Ada, MD;  Location: [place];  Service: Endoscopy;;   TEE WITHOUT CARDIOVERSION N/A 10/09/2013   Procedure: TRANSESOPHAGEAL ECHOCARDIOGRAM (TEE);  Surgeon: Pixie Casino, MD;  Location: [facility] ENDOSCOPY;  Service: Cardiovascular;  Laterality: N/A;    Current Outpatient Medications  Medication Sig Dispense Refill   acetaminophen (TYLENOL) 650 MG CR tablet Take 1,300 mg by mouth every 8 (eight) hours as needed for pain.  allopurinol (ZYLOPRIM) 300 MG tablet Take 300 mg by mouth daily after breakfast.     atorvastatin (LIPITOR) 10 MG tablet Take 1 tablet (10 mg total) by mouth daily. 90 tablet 3   blood glucose meter kit and supplies KIT Dispense based on patient and insurance preference. Use up to four times daily as directed. (FOR ICD-9 250.00, 250.01). 1 each 0   CALCIUM PO  Take 1 tablet by mouth daily.     Cholecalciferol (VITAMIN D3) 125 MCG (5000 UT) CAPS Take 1 capsule by mouth daily.     Coenzyme Q10 (COQ-10) 100 MG CAPS Take 100 mg by mouth daily.     dofetilide (TIKOSYN) 250 MCG capsule Take 1 capsule (250 mcg total) by mouth 2 (two) times daily. 180 capsule 3   GLUCOSAMINE-CHONDROITIN PO Take 1 tablet by mouth daily.      Magnesium 250 MG TABS Take 1 tablet by mouth daily.     Multiple Vitamin (MULTIVITAMIN) tablet Take 1 tablet by mouth daily.     pantoprazole (PROTONIX) 40 MG tablet Take 1 tablet (40 mg total) by mouth daily. 30 tablet 1   PROLIA 60 MG/ML SOSY injection Inject 60 mg into the skin every 6 (six) months.     XARELTO 20 MG TABS tablet TAKE 1 TABLET(20 MG) BY MOUTH DAILY 90 tablet 1   No current facility-administered medications for this encounter.    Allergies  Allergen Reactions   Cyclobenzaprine Nausea Only   Indomethacin Rash    Social History   Socioeconomic History   Marital status: Unknown    Spouse name: [name]   Number of children: 2   Years of education: college   Highest education level: Not on file  Occupational History   Occupation: retired   Tobacco Use   Smoking status: Former    Packs/day: 0.50    Years: 15.00    Total pack years: 7.50    Types: Cigarettes    Quit date: 07/13/2015    Years since quitting: 6.7    Passive exposure: Never   Smokeless tobacco: Never   Tobacco comments:    Former smoker 10/11/21  Vaping Use   Vaping Use: Never used  Substance and Sexual Activity   Alcohol use: Yes    Alcohol/week: 1.0 standard drink of alcohol    Types: 1 Standard drinks or equivalent per week    Comment: 1 beer once a month 04/27/22   Drug use: No   Sexual activity: Yes  Other Topics Concern   Not on file  Social History Narrative   Patient is married lives with Spouse [name]).   Patient is right handed.   Patient has a Secretary/administrator Degree   Patient drinks 1 cup of coffee daily   Social Determinants  of Health   Financial Resource Strain: Not on file  Food Insecurity: Not on file  Transportation Needs: Not on file  Physical Activity: Not on file  Stress: Not on file  Social Connections: Not on file  Intimate Partner Violence: Not on file     ROS- All systems are reviewed and negative except as per the HPI above.  Physical Exam: Vitals:   04/27/22 1526  BP: (!) 148/90  Pulse: 66  Weight: 88.6 kg  Height: 5\' 10"  (1.778 m)     GEN- The patient is a well appearing elderly male, alert and oriented x 3 today.   HEENT-head normocephalic, atraumatic, sclera clear, conjunctiva pink, hearing intact, trachea midline. Lungs- Clear to ausculation bilaterally,  normal work of breathing Heart- Regular rate and rhythm, occasional ectopic beat, no murmurs, rubs or gallops  GI- soft, NT, ND, + BS Extremities- no clubbing, cyanosis, or edema MS- no significant deformity or atrophy Skin- no rash or lesion Psych- euthymic mood, full affect Neuro- strength and sensation are intact   Wt Readings from Last 3 Encounters:  04/27/22 88.6 kg  03/06/22 88.9 kg  11/29/21 86.3 kg    EKG today demonstrates  SR, 1st degree AV block, PVC, PACs Vent. rate 66 BPM PR interval * ms QRS duration 86 ms QT/QTcB 434/454 ms  Echo 02/13/21 demonstrated   1. Left ventricular ejection fraction, by estimation, is 70 to 75%. The  left ventricle has hyperdynamic function. The left ventricle has no  regional wall motion abnormalities. There is mild left ventricular  hypertrophy. Left ventricular diastolic function could not be evaluated.   2. Right ventricular systolic function is normal. The right ventricular  size is normal.   3. Left atrial size was moderately dilated.   4. The mitral valve is normal in structure. Mild mitral valve  regurgitation. No evidence of mitral stenosis.   5. The aortic valve is tricuspid. Aortic valve regurgitation is not  visualized. Aortic valve sclerosis is present, with  no evidence of aortic valve stenosis.   6. The inferior vena cava is normal in size with greater than 50%  respiratory variability, suggesting right atrial pressure of 3 mmHg.   Epic records are reviewed at length today  CHA2DS2-VASc Score = 6  The patient's score is based upon: CHF History: 0 HTN History: 1 Diabetes History: 0 Stroke History: 2 Vascular Disease History: 1 Age Score: 2 Gender Score: 0       ASSESSMENT AND PLAN: 1. Persistent Atrial Fibrillation/atrial flutter The patient's CHA2DS2-VASc score is 6, indicating a 9.7% annual risk of stroke.   S/p dofetilide admission 7/18-7/21/23 Patient having more frequent afib. We discussed rhythm control options today. Patient agreeable to consultation for ablation. D/w Dr Lovena Le previously.  Continue dofetilide 250 mcg BID. QT stable. Continue Xarelto 20 mg daily Off AV nodal agents with h/o significant bradycardia/pauses.  Apple Watch for home monitoring.  2. Secondary Hypercoagulable State (ICD10:  D68.69) The patient is at significant risk for stroke/thromboembolism based upon his CHA2DS2-VASc Score of 6.  Continue Rivaroxaban (Xarelto).   3. HTN Stable, no changes today.  4. CAD On statin No anginal symptoms.   Follow up with EP to discuss afib ablation.    [facility] [address] [place], [place] [address] [phone] 04/27/2022 3:42 PM

## 2022-05-02 ENCOUNTER — Ambulatory Visit (HOSPITAL_COMMUNITY): Payer: Medicare Other | Admitting: Physician Assistant

## 2022-05-05 ENCOUNTER — Ambulatory Visit (INDEPENDENT_AMBULATORY_CARE_PROVIDER_SITE_OTHER): Payer: Medicare Other | Admitting: Pulmonary Disease

## 2022-05-05 ENCOUNTER — Encounter: Payer: Self-pay | Admitting: Pulmonary Disease

## 2022-05-05 ENCOUNTER — Ambulatory Visit: Payer: Medicare Other | Admitting: Pulmonary Disease

## 2022-05-05 VITALS — BP 130/64 | HR 67 | Temp 97.8°F | Ht 70.0 in | Wt 195.2 lb

## 2022-05-05 DIAGNOSIS — J849 Interstitial pulmonary disease, unspecified: Secondary | ICD-10-CM

## 2022-05-05 LAB — PULMONARY FUNCTION TEST
DL/VA % pred: 63 %
DL/VA: 2.49 ml/min/mmHg/L
DLCO unc % pred: 51 %
DLCO unc: 12.84 ml/min/mmHg
FEF 25-75 Post: 3.4 L/sec
FEF 25-75 Pre: 2.76 L/sec
FEF2575-%Change-Post: 23 %
FEF2575-%Pred-Post: 156 %
FEF2575-%Pred-Pre: 126 %
FEV1-%Change-Post: 7 %
FEV1-%Pred-Post: 91 %
FEV1-%Pred-Pre: 84 %
FEV1-Post: 2.77 L
FEV1-Pre: 2.57 L
FEV1FVC-%Change-Post: -1 %
FEV1FVC-%Pred-Pre: 112 %
FEV6-%Change-Post: 9 %
FEV6-%Pred-Post: 87 %
FEV6-%Pred-Pre: 79 %
FEV6-Post: 3.45 L
FEV6-Pre: 3.14 L
FEV6FVC-%Pred-Post: 106 %
FEV6FVC-%Pred-Pre: 106 %
FVC-%Change-Post: 9 %
FVC-%Pred-Post: 82 %
FVC-%Pred-Pre: 74 %
FVC-Post: 3.45 L
FVC-Pre: 3.14 L
Post FEV1/FVC ratio: 80 %
Post FEV6/FVC ratio: 100 %
Pre FEV1/FVC ratio: 82 %
Pre FEV6/FVC Ratio: 100 %
RV % pred: 59 %
RV: 1.53 L
TLC % pred: 71 %
TLC: 5.06 L

## 2022-05-05 NOTE — Progress Notes (Signed)
Jonathon Stevens    FC:[id number]    December 31, 1945  Primary Care Physician:Wilson, Jama Flavors, MD  Referring Physician: Christain Sacramento, MD [address]  [place] [address]  Chief complaint: Follow-up for interstitial lung disease  HPI: 77 y.o. with CVA, AAA, right common iliac aneurysm, HLD, pulmonary fibrosis, COPD, CKD, esophageal bleed s/p clipping, atrial flutter, atrial fibrillation  He had a recent CT showing mild interstitial changes and has been referred here for further evaluation.  History notable for COVID infection requiring hospitalization for 2 weeks in January 2021.  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  He was previously evaluated by Dr. Gaynelle Arabian, pulmonology at [facility] tried on stiolto inhaler but stopped after he did not notice any improvement  He has significant cardiac history with CHF, longstanding issue with recurrent atrial fibrillation.  He underwent cardioversion and Tikosyn initiation in July 2023 with improvement in symptoms of dyspnea but appears to be back in atrial fibrillation/atrial flutter again.  Pets: No pets Occupation: Used to work in Press [employer] Exposures: No exposure history.  No mold, hot tub, Jacuzzi.  No feather pillows or comforters Smoking history:10-pack-year smoker.  Quit in 2013 Travel history: No significant travel Relevant family history: No family history of lung disease  Interim history: Here for review of PFTs.  Has ongoing issues with atrial fibrillation, flutter and tachybradycardia syndrome.  Started on dofetilide  Outpatient Encounter Medications as of 05/05/2022  Medication Sig   acetaminophen (TYLENOL) 650 MG CR tablet Take 1,300 mg by mouth every 8 (eight) hours as needed for pain.   allopurinol (ZYLOPRIM) 300 MG tablet Take 300 mg by mouth daily after breakfast.   atorvastatin (LIPITOR) 10 MG tablet Take 1 tablet (10 mg total) by mouth daily.   blood glucose meter kit and  supplies KIT Dispense based on patient and insurance preference. Use up to four times daily as directed. (FOR ICD-9 250.00, 250.01).   CALCIUM PO Take 1 tablet by mouth daily.   Cholecalciferol (VITAMIN D3) 125 MCG (5000 UT) CAPS Take 1 capsule by mouth daily.   Coenzyme Q10 (COQ-10) 100 MG CAPS Take 100 mg by mouth daily.   dofetilide (TIKOSYN) 250 MCG capsule Take 1 capsule (250 mcg total) by mouth 2 (two) times daily.   GLUCOSAMINE-CHONDROITIN PO Take 1 tablet by mouth daily.    Magnesium 250 MG TABS Take 1 tablet by mouth daily.   Multiple Vitamin (MULTIVITAMIN) tablet Take 1 tablet by mouth daily.   pantoprazole (PROTONIX) 40 MG tablet Take 1 tablet (40 mg total) by mouth daily.   PROLIA 60 MG/ML SOSY injection Inject 60 mg into the skin every 6 (six) months.   XARELTO 20 MG TABS tablet TAKE 1 TABLET(20 MG) BY MOUTH DAILY   No facility-administered encounter medications on file as of 05/05/2022.    Physical Exam: Blood pressure 130/64, pulse 67, temperature 97.8 F (36.6 C), temperature source Oral, height 5' 10"$  (1.778 m), weight 195 lb 3.2 oz (88.5 kg), SpO2 96 %. Gen:      No acute distress HEENT:  EOMI, sclera anicteric Neck:     No masses; no thyromegaly Lungs:    Clear to auscultation bilaterally; normal respiratory effort CV:         Regular rate and rhythm; no murmurs Abd:      + bowel sounds; soft, non-tender; no palpable masses, no distension Ext:    No edema;  adequate peripheral perfusion Skin:      Warm and dry; no rash Neuro: alert and oriented x 3 Psych: normal mood and affect   Data Reviewed: Imaging: CT chest from [facility] 4/8/20201- 6 mm pulmonary nodule, emphysema, bilateral lower lobe reticulation with septal thickening and traction bronchiectasis.  Indeterminate pattern.  CT high-resolution 09/26/2021-pleural septal thickening with trace reticulation.  Indeterminate for UIP.  Pulmonary nodules measuring up to 4 mm. I have reviewed the images  personally.  PFTs:  P[facility] 10/13/2019 FEV1/FVC: 84% FVC: 3.16/71% FEV1: 2.6/70% TLC: 5.19/74% DLCO uncorrected: 45%  05/05/2022 FVC 3.45 [82%], FEV1 2.77 [91%], F/F80, TLC 5.06 [71%], DLCO 12.84 [51%] Mild restriction, moderate diffusion defect  Labs: Connective tissue disease serologies 07/09/2019-positive for ANA 1: 320 speckled pattern, negative CCP, SCL 70, SSA, SSB  Assessment:  Evaluation for interstitial lung disease He has very mild changes at the base which is indeterminate for UIP pattern.  I have reviewed his scans from [facility] in 2021 and feel that his ILD is actually better as the groundglass opacities have improved.  This could be result of his COVID infection requiring hospitalization in early January 2021.  Overall is not concerning for significant progressive interstitial lung disease and his dyspnea is likely related to his ongoing issues with recurrent atrial fibrillation/flutter  There are no significant exposures or signs and symptoms of connective tissue disease.  Positive ANA 2021 is likely nonspecific  We will continue to monitor Order high-res CT and ex months.  Emphysema No obstruction on prior PFTs.  He has been on stiolto before but did not have any benefit from it and does not want to retry  Plan/Recommendations: High-res CT in 6 months  Marshell Garfinkel MD  Pulmonary and Critical Care 05/05/2022, 11:24 AM  CC: Christain Sacramento, MD

## 2022-05-05 NOTE — Addendum Note (Signed)
Addended by: Elton Sin on: 05/05/2022 11:46 AM   Modules accepted: Orders

## 2022-05-05 NOTE — Addendum Note (Signed)
Addended by: Elton Sin on: 05/05/2022 11:43 AM   Modules accepted: Orders

## 2022-05-05 NOTE — Progress Notes (Signed)
Full PFT performed today. °

## 2022-05-05 NOTE — Patient Instructions (Addendum)
I am glad you are stable with your breathing PFTs are stable as well which is good news Will order high-res CT in 6 months and return to clinic in 6 months after scan.

## 2022-05-30 ENCOUNTER — Other Ambulatory Visit: Payer: Self-pay

## 2022-05-30 ENCOUNTER — Ambulatory Visit (HOSPITAL_COMMUNITY): Payer: Medicare Other | Admitting: Physician Assistant

## 2022-05-30 DIAGNOSIS — I7143 Infrarenal abdominal aortic aneurysm, without rupture: Secondary | ICD-10-CM

## 2022-05-30 DIAGNOSIS — I714 Abdominal aortic aneurysm, without rupture, unspecified: Secondary | ICD-10-CM

## 2022-06-01 ENCOUNTER — Ambulatory Visit (HOSPITAL_COMMUNITY)
Admission: RE | Admit: 2022-06-01 | Discharge: 2022-06-01 | Disposition: A | Discharge status: Home / Self Care | Payer: Medicare Other | Source: Ambulatory Visit | Attending: Vascular Surgery | Admitting: Vascular Surgery

## 2022-06-01 DIAGNOSIS — I714 Abdominal aortic aneurysm, without rupture, unspecified: Secondary | ICD-10-CM | POA: Insufficient documentation

## 2022-06-01 DIAGNOSIS — I7143 Infrarenal abdominal aortic aneurysm, without rupture: Secondary | ICD-10-CM | POA: Insufficient documentation

## 2022-06-05 ENCOUNTER — Ambulatory Visit: Payer: Medicare Other | Attending: Cardiovascular Disease | Admitting: Cardiovascular Disease

## 2022-06-05 ENCOUNTER — Encounter: Payer: Self-pay | Admitting: Cardiovascular Disease

## 2022-06-05 VITALS — BP 144/60 | HR 59 | Ht 70.0 in | Wt 196.0 lb

## 2022-06-05 DIAGNOSIS — D6869 Other thrombophilia: Secondary | ICD-10-CM | POA: Diagnosis not present

## 2022-06-05 DIAGNOSIS — I6310 Cerebral infarction due to embolism of unspecified precerebral artery: Secondary | ICD-10-CM | POA: Diagnosis not present

## 2022-06-05 DIAGNOSIS — I480 Paroxysmal atrial fibrillation: Secondary | ICD-10-CM | POA: Diagnosis not present

## 2022-06-05 NOTE — Progress Notes (Unsigned)
Electrophysiology Office Note:    Date:  06/05/2022   ID:  Jonathon Stevens, DOB [birth year], MRN [id number]  PCP:  Christain Sacramento, MD   [place] Providers Cardiologist:  Larae Grooms, MD { Click to update primary MD,subspecialty MD or APP then REFRESH:1}    Referring MD: Oliver Barre, PA   History of Present Illness:    Jonathon Stevens is a 77 y.o. male with a hx listed below, significant for atrial fibrillation, stroke, referred for arrhythmia management.  He was originally diagnosed with atrial fibrillation years ago during routine monitoring after a stroke.  It was not until winter 2021 when he was hospitalized with Covid pneumonia that he began to have recurrent symptomatic episodes. Dofetilide was started and controlled his AF for about 5 months, but he then began to have recurrence.  He monitors his atrial fibrillation with his Apple Watch and has noticed that the burden has increased from about 10% up to roughly 50% over the past several months.  He feels very fatigued and short of breath with atrial fibrillation.  He has not had any syncope, presyncope, anginal chest pain.    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 ECHO 10/08/13: EF 45-50%, mild hypokinesis of apical myocardium. Mild LA dilation, mild RA dilation;  b.  TEE (10/09/13):  EF 50-55%, normal wall motion, no LAA clot   Hypertension    Stroke [facility])     Past Surgical History:  Procedure Laterality Date   CARDIOVERSION N/A 10/13/2021   Procedure: CARDIOVERSION;  Surgeon: Berniece Salines, DO;  Location: [place];  Service: Cardiovascular;  Laterality: N/A;   ESOPHAGOGASTRODUODENOSCOPY (EGD) WITH PROPOFOL N/A 02/14/2021   Procedure: ESOPHAGOGASTRODUODENOSCOPY (EGD) WITH PROPOFOL;  Surgeon: Carol Ada, MD;   Location: [place];  Service: Endoscopy;  Laterality: N/A;   HEMOSTASIS CLIP PLACEMENT  02/14/2021   Procedure: HEMOSTASIS CLIP PLACEMENT;  Surgeon: Carol Ada, MD;  Location: [place];  Service: Endoscopy;;   TEE WITHOUT CARDIOVERSION N/A 10/09/2013   Procedure: TRANSESOPHAGEAL ECHOCARDIOGRAM (TEE);  Surgeon: Pixie Casino, MD;  Location: [facility] ENDOSCOPY;  Service: Cardiovascular;  Laterality: N/A;    Current Medications: Current Meds  Medication Sig   acetaminophen (TYLENOL) 650 MG CR tablet Take 1,300 mg by mouth every 8 (eight) hours as needed for pain.   allopurinol (ZYLOPRIM) 300 MG tablet Take 300 mg by mouth daily after breakfast.   atorvastatin (LIPITOR) 10 MG tablet Take 1 tablet (10 mg total) by mouth daily.   blood glucose meter kit and supplies KIT Dispense based on patient and insurance preference. Use up to four times daily as directed. (FOR ICD-9 250.00, 250.01).   CALCIUM PO Take 1 tablet by mouth daily.   Cholecalciferol (VITAMIN D3) 125 MCG (5000 UT) CAPS Take 1 capsule by mouth daily.   Coenzyme Q10 (COQ-10) 100 MG CAPS Take 100 mg by mouth daily.   dofetilide (TIKOSYN) 250 MCG capsule Take 1 capsule (250 mcg total) by mouth 2 (two) times daily.   GLUCOSAMINE-CHONDROITIN PO Take 1 tablet by mouth daily.    Magnesium 250 MG TABS Take 1 tablet by mouth daily.   Multiple Vitamin (MULTIVITAMIN) tablet Take 1 tablet by mouth daily.   pantoprazole (PROTONIX) 40 MG tablet Take 1 tablet (40  mg total) by mouth daily.   PROLIA 60 MG/ML SOSY injection Inject 60 mg into the skin every 6 (six) months.   XARELTO 20 MG TABS tablet TAKE 1 TABLET(20 MG) BY MOUTH DAILY     Allergies:   Cyclobenzaprine and Indomethacin   Social and Family History: Reviewed in Epic  ROS:   Please see the history of present illness.    All other systems reviewed and are negative.  EKGs/Labs/Other Studies Reviewed Today:    Cardiac Studies & Procedures     STRESS TESTS  MYOCARDIAL  PERFUSION IMAGING 04/20/2021  Narrative   The study is normal. The study is low risk.   No ST deviation was noted.   LV perfusion is normal. There is no evidence of ischemia. There is no evidence of infarction.   Left ventricular function is normal. Nuclear stress EF: 71 %. The left ventricular ejection fraction is hyperdynamic (>65%). End diastolic cavity size is normal.   Prior study not available for comparison.   ECHOCARDIOGRAM  ECHOCARDIOGRAM COMPLETE 02/13/2021  Narrative ECHOCARDIOGRAM REPORT    Patient Name:   Jonathon Stevens Date of Exam: 02/13/2021 Medical Rec #:  [id number]       Height:       70.0 in Accession #:    [id number]      Weight:       190.9 lb Date of Birth:  [birth year]       BSA:          2.047 m Patient Age:    80 years        BP:           113/57 mmHg Patient Gender: M               HR:           107 bpm. Exam Location:  Inpatient  Procedure: 2D Echo, Cardiac Doppler and Color Doppler  Indications:    I48.1 Persistent atrial fibrillation  History:        Patient has no prior history of Echocardiogram examinations. Arrythmias:Atrial Fibrillation and Tachycardia.  Sonographer:    Glo Herring Referring Phys: [place]   1. Left ventricular ejection fraction, by estimation, is 70 to 75%. The left ventricle has hyperdynamic function. The left ventricle has no regional wall motion abnormalities. There is mild left ventricular hypertrophy. Left ventricular diastolic function could not be evaluated. 2. Right ventricular systolic function is normal. The right ventricular size is normal. 3. Left atrial size was moderately dilated. 4. The mitral valve is normal in structure. Mild mitral valve regurgitation. No evidence of mitral stenosis. 5. The aortic valve is tricuspid. Aortic valve regurgitation is not visualized. Aortic valve sclerosis is present, with no evidence of aortic valve stenosis. 6. The inferior vena cava is normal in  size with greater than 50% respiratory variability, suggesting right atrial pressure of 3 mmHg.  FINDINGS Left Ventricle: Left ventricular ejection fraction, by estimation, is 70 to 75%. The left ventricle has hyperdynamic function. The left ventricle has no regional wall motion abnormalities. The left ventricular internal cavity size was normal in size. There is mild left ventricular hypertrophy. Left ventricular diastolic function could not be evaluated due to atrial fibrillation. Left ventricular diastolic function could not be evaluated.  Right Ventricle: The right ventricular size is normal. Right ventricular systolic function is normal.  Left Atrium: Left atrial size was moderately dilated.  Right Atrium: Right atrial size was normal in size.  Pericardium: There is no evidence of pericardial effusion.  Mitral Valve: The mitral valve is normal in structure. Mild mitral annular calcification. Mild mitral valve regurgitation. No evidence of mitral valve stenosis.  Tricuspid Valve: The tricuspid valve is normal in structure. Tricuspid valve regurgitation is trivial. No evidence of tricuspid stenosis.  Aortic Valve: The aortic valve is tricuspid. Aortic valve regurgitation is not visualized. Aortic valve sclerosis is present, with no evidence of aortic valve stenosis. Aortic valve mean gradient measures 4.3 mmHg. Aortic valve peak gradient measures 7.2 mmHg. Aortic valve area, by VTI measures 2.53 cm.  Pulmonic Valve: The pulmonic valve was normal in structure. Pulmonic valve regurgitation is not visualized. No evidence of pulmonic stenosis.  Aorta: The aortic root is normal in size and structure.  Venous: The inferior vena cava is normal in size with greater than 50% respiratory variability, suggesting right atrial pressure of 3 mmHg.  IAS/Shunts: No atrial level shunt detected by color flow Doppler.   LEFT VENTRICLE PLAX 2D LVIDd:         3.80 cm   Diastology LVIDs:         2.10  cm   LV e' medial:    6.71 cm/s LV PW:         1.20 cm   LV E/e' medial:  20.6 LV IVS:        1.20 cm   LV e' lateral:   9.88 cm/s LVOT diam:     2.00 cm   LV E/e' lateral: 14.0 LV SV:         66 LV SV Index:   32 LVOT Area:     3.14 cm   RIGHT VENTRICLE          IVC RV Basal diam:  3.30 cm  IVC diam: 1.90 cm  LEFT ATRIUM             Index        RIGHT ATRIUM           Index LA diam:        4.90 cm 2.39 cm/m   RA Area:     17.80 cm LA Vol (A2C):   58.8 ml 28.73 ml/m  RA Volume:   48.60 ml  23.75 ml/m LA Vol (A4C):   74.0 ml 36.16 ml/m LA Biplane Vol: 71.1 ml 34.74 ml/m AORTIC VALVE AV Area (Vmax):    2.65 cm AV Area (Vmean):   2.57 cm AV Area (VTI):     2.53 cm AV Vmax:           134.00 cm/s AV Vmean:          96.433 cm/s AV VTI:            0.261 m AV Peak Grad:      7.2 mmHg AV Mean Grad:      4.3 mmHg LVOT Vmax:         113.00 cm/s LVOT Vmean:        78.900 cm/s LVOT VTI:          0.210 m LVOT/AV VTI ratio: 0.81  AORTA Ao Root diam: 3.10 cm Ao Asc diam:  2.90 cm  MITRAL VALVE MV Area (PHT): 6.17 cm     SHUNTS MV Decel Time: 123 msec     Systemic VTI:  0.21 m MV E velocity: 138.00 cm/s  Systemic Diam: 2.00 cm  Kirk Ruths MD Electronically signed by Kirk Ruths MD Signature Date/Time: 02/13/2021/1:40:16 PM  Final    MONITORS  LONG TERM MONITOR (3-14 DAYS) 09/10/2021  Narrative  Atrial fibrillation, atrial flutter. Average heart rate 87 bpm.  Rare PACs and PVCs.  2 episodes of ventricular tachycardia.  Chart review shows that the strips were reviewed with Dr. Lovena Le who recommended EP consult.   Patch Wear Time:  14 days and 0 hours (2023-05-24T10:07:35-0400 to 2023-06-07T10:07:39-0400)  2 Ventricular Tachycardia runs occurred, the run with the fastest interval lasting 8.8 secs with a max rate of 167 bpm (avg 154 bpm); the run with the fastest interval was also the longest. Atrial Fibrillation/Flutter occurred continuously (100%  burden), ranging from 26-183 bpm (avg of 87 bpm). 1741 Pauses occurred, the longest lasting 10.5 secs (6 bpm). Isolated VEs were rare (<1.0%, 10597), VE Couplets were rare (<1.0%, 104), and VE Triplets were rare (<1.0%, 12). Ventricular Trigeminy was present. MD notification criteria for Slow Atrial Fibrillation/Atrial Flutter met and Pauses met - report posted prior to notification per account request (AG).           EKG:  Last EKG results: today Sinus rhythm with PVCs   Recent Labs: 10/28/2021: Hemoglobin 15.3; Platelets 168 11/29/2021: BUN 16; Creatinine, Ser 1.31; Magnesium 2.0; Potassium 4.3; Sodium 135     Physical Exam:    VS:  BP (!) 144/60   Pulse (!) 59   Ht '5\' 10"'$  (1.778 m)   Wt 196 lb (88.9 kg)   SpO2 97%   BMI 28.12 kg/m     Wt Readings from Last 3 Encounters:  06/05/22 196 lb (88.9 kg)  05/05/22 195 lb 3.2 oz (88.5 kg)  04/27/22 195 lb 6.4 oz (88.6 kg)     GEN: Well nourished, well developed in no acute distress CARDIAC: RRR, no murmurs, rubs, gallops RESPIRATORY:  Normal work of breathing MUSCULOSKELETAL: no edema    ASSESSMENT & PLAN:    Atrial fibrillation: persistent, on Tikosyn with recurrent paroxysms of AF. We discussed the indication, rationale, logistics, anticipated benefits, and potential risks of the ablation procedure including but not limited to -- bleed at the groin access site, chest pain, damage to nearby organs such as the diaphragm, lungs, or esophagus, need for a drainage tube, or prolonged hospitalization. I explained that the risk for stroke, heart attack, need for open chest surgery, or even death is very low but not zero. he  expressed understanding and wishes to proceed.  Stroke: continue xarelto '20mg'$  BID HTN: no changes today        Medication Adjustments/Labs and Tests Ordered: Current medicines are reviewed at length with the patient today.  Concerns regarding medicines are outlined above.  Orders Placed This Encounter   Procedures   EKG 12-Lead   No orders of the defined types were placed in this encounter.    Signed, Melida Quitter, MD  06/05/2022 4:50 PM    [place]

## 2022-06-05 NOTE — Progress Notes (Signed)
Spirometry pre/post dlco performed today.

## 2022-06-05 NOTE — Patient Instructions (Addendum)
Medication Instructions:  Your physician recommends that you continue on your current medications as directed. Please refer to the Current Medication list given to you today.  *If you need a refill on your cardiac medications before your next appointment, please call your pharmacy*  Lab Work: None ordered.  If you have labs (blood work) drawn today and your tests are completely normal, you will receive your results only by: [facility] (if you have MyChart) OR A paper copy in the mail If you have any lab test that is abnormal or we need to change your treatment, we will call you to review the results.  Testing/Procedures: None ordered.  Follow-Up: Dr. Doralee Albino has order an Atrial Fibrillation Ablation with anesthesia, carto, and Ice.  Our scheduling team member will reach out to you with dates / times, and help you schedule your Ablation.  We will also need to have a CBC and BMET labs drawn, 30 days prior to your procedure.  We will schedule a lab draw appointment with you in the near future as well.  See the educational information below.    Cardiac Ablation Cardiac ablation is a procedure to destroy, or ablate, a small amount of heart tissue that is causing problems. The heart has many electrical connections. Sometimes, these connections are abnormal and can cause the heart to beat very fast or irregularly. Ablating the abnormal areas can improve the heart's rhythm or return it to normal. Ablation may be done for people who: Have irregular or rapid heartbeats (arrhythmias). Have Wolff-Parkinson-White syndrome. Have taken medicines for an arrhythmia that did not work or caused side effects. Have a high-risk heartbeat that may be life-threatening. Tell a health care provider about: Any allergies you have. All medicines you are taking, including vitamins, herbs, eye drops, creams, and over-the-counter medicines. Any problems you or family members have had with anesthesia. Any  bleeding problems you have. Any surgeries you have had. Any medical conditions you have. Whether you are pregnant or may be pregnant. What are the risks? Your health care provider will talk with you about risks. These may include: Infection. Bruising and bleeding. Stroke or blood clots. Damage to nearby structures or organs. Allergic reaction to medicines or dyes. Needing a pacemaker if the heart gets damaged. A pacemaker is a device that helps the heart beat normally. Failure of the procedure. A repeat procedure may be needed. What happens before the procedure? Medicines Ask your health care provider about: Changing or stopping your regular medicines. These include any heart rhythm medicines, diabetes medicines, or blood thinners you take. Taking medicines such as aspirin and ibuprofen. These medicines can thin your blood. Do not take them unless your health care provider tells you to. Taking over-the-counter medicines, vitamins, herbs, and supplements. General instructions Follow instructions from your health care provider about what you may eat and drink. If you will be going home right after the procedure, plan to have a responsible adult: Take you home from the hospital or clinic. You will not be allowed to drive. Care for you for the time you are told. Ask your health care provider what steps will be taken to prevent infection. What happens during the procedure?  An IV will be inserted into one of your veins. You may be given: A sedative. This helps you relax. Anesthesia. This will: Numb certain areas of your body. An incision will be made in your neck or your groin. A needle will be inserted through the incision and into a  large vein in your neck or groin. The small, thin tube (catheter) will be inserted through the needle and moved to your heart. A type of X-ray (fluoroscopy) will be used to help guide the catheter and provide images of the heart on a monitor. Dye may be  injected through the catheter to help your surgeon see the area of the heart that needs treatment. Electrical currents will be sent from the catheter to destroy heart tissue in certain areas. There are three types of energy that may be used to do this: Heat (radiofrequency energy). Laser energy. Extreme cold (cryoablation). When the tissue has been destroyed, the catheter will be removed. Pressure will be held on the insertion area to prevent bleeding. A bandage (dressing) will be placed over the insertion area. The procedure may vary among health care providers and hospitals. What happens after the procedure? Your blood pressure, heart rate and rhythm, breathing rate, and blood oxygen level will be monitored until you leave the hospital or clinic. Your insertion area will be checked for bleeding. You will need to lie still for a few hours. If your groin was used, you will need to keep your leg straight for a few hours after the catheter is removed. This information is not intended to replace advice given to you by your health care provider. Make sure you discuss any questions you have with your health care provider. Document Revised: 08/30/2021 Document Reviewed: 08/30/2021 Elsevier Patient Education  [place].

## 2022-06-07 ENCOUNTER — Telehealth: Payer: Self-pay

## 2022-06-07 ENCOUNTER — Encounter: Payer: Self-pay | Admitting: Physician Assistant

## 2022-06-07 ENCOUNTER — Ambulatory Visit: Payer: Medicare Other | Admitting: Physician Assistant

## 2022-06-07 VITALS — BP 142/88 | HR 101 | Temp 97.7°F | Ht 70.0 in | Wt 193.0 lb

## 2022-06-07 DIAGNOSIS — I480 Paroxysmal atrial fibrillation: Secondary | ICD-10-CM

## 2022-06-07 DIAGNOSIS — I714 Abdominal aortic aneurysm, without rupture, unspecified: Secondary | ICD-10-CM | POA: Diagnosis not present

## 2022-06-07 DIAGNOSIS — I723 Aneurysm of iliac artery: Secondary | ICD-10-CM | POA: Diagnosis not present

## 2022-06-07 NOTE — Telephone Encounter (Signed)
Pt is scheduled for 09/06/22...  Lab appt: 08/18/22...  Instruction letters will be mailed to pt's home address per his request.

## 2022-06-07 NOTE — Progress Notes (Signed)
Office Note     CC:  follow up Requesting Provider:  Christain Sacramento, MD  HPI: Jonathon Stevens is a 77 y.o. 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 His aneurysms were initially identified incidentally on MRI of his spine in 2022 and he was referred to our office for evaluation by Dr. Donzetta Matters. They have been stable on non invasive follow up.   He says he has had some generalized weakness and soreness in his muscles and joints since having COVID in 2021 and also since developing atrial fibrillation. He and his wife were very active until both of these things sort of slowed him down. He does report that he still walks frequently but with his decreased energy he is less motivated recently. He was just seen by his Electrophysiologist, Dr. Myles Gip and is scheduled to have an ablation in a couple months. He is hopeful that if successful this will help his energy levels. He otherwise denies any back or abdominal pain. He is not having any claudication, rest pain or tissue loss.   The pt is on a statin for cholesterol management.  The pt is not on a daily aspirin.   Other AC:  Rivaroxaban The pt is not on medication for hypertension.   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 ECHO 10/08/13: EF 45-50%, mild hypokinesis of apical myocardium. Mild LA dilation, mild RA dilation;  b.  TEE (10/09/13):  EF 50-55%, normal wall motion, no LAA clot   Hypertension    Stroke [facility])     Past Surgical History:  Procedure Laterality Date   CARDIOVERSION N/A 10/13/2021   Procedure: CARDIOVERSION;  Surgeon: Berniece Salines, DO;  Location: [place];  Service: Cardiovascular;  Laterality: N/A;   ESOPHAGOGASTRODUODENOSCOPY (EGD) WITH  PROPOFOL N/A 02/14/2021   Procedure: ESOPHAGOGASTRODUODENOSCOPY (EGD) WITH PROPOFOL;  Surgeon: Carol Ada, MD;  Location: [place];  Service: Endoscopy;  Laterality: N/A;   HEMOSTASIS CLIP PLACEMENT  02/14/2021   Procedure: HEMOSTASIS CLIP PLACEMENT;  Surgeon: Carol Ada, MD;  Location: [place];  Service: Endoscopy;;   TEE WITHOUT CARDIOVERSION N/A 10/09/2013   Procedure: TRANSESOPHAGEAL ECHOCARDIOGRAM (TEE);  Surgeon: Pixie Casino, MD;  Location[facility] ENDOSCOPY;  Service: Cardiovascular;  Laterality: N/A;    Social History   Socioeconomic History   Marital status: Unknown    Spouse name: [name]   Number of children: 2   Years of education: college   Highest education level: Not on file  Occupational History   Occupation: retired   Tobacco Use   Smoking status: Former    Packs/day: 0.50    Years: 15.00    Total pack years: 7.50    Types: Cigarettes    Quit date: 07/13/2015    Years since quitting: 6.9    Passive exposure: Never   Smokeless tobacco: Never   Tobacco comments:    Former smoker 10/11/21  Vaping Use   Vaping Use: Never used  Substance and Sexual Activity   Alcohol use: Yes    Alcohol/week: 1.0 standard drink of alcohol    Types: 1 Standard drinks or equivalent per week    Comment: 1 beer once a  month 04/27/22   Drug use: No   Sexual activity: Yes  Other Topics Concern   Not on file  Social History Narrative   Patient is married lives with Spouse [name]).   Patient is right handed.   Patient has a Secretary/administrator Degree   Patient drinks 1 cup of coffee daily   Social Determinants of Health   Financial Resource Strain: Not on file  Food Insecurity: Not on file  Transportation Needs: Not on file  Physical Activity: Not on file  Stress: Not on file  Social Connections: Not on file  Intimate Partner Violence: Not on file    Family History  Problem Relation Age of Onset   Hypertension Mother    Stroke Mother    Hypertension Father    Heart  attack Neg Hx     Current Outpatient Medications  Medication Sig Dispense Refill   acetaminophen (TYLENOL) 650 MG CR tablet Take 1,300 mg by mouth every 8 (eight) hours as needed for pain.     allopurinol (ZYLOPRIM) 300 MG tablet Take 300 mg by mouth daily after breakfast.     atorvastatin (LIPITOR) 10 MG tablet Take 1 tablet (10 mg total) by mouth daily. 90 tablet 3   blood glucose meter kit and supplies KIT Dispense based on patient and insurance preference. Use up to four times daily as directed. (FOR ICD-9 250.00, 250.01). 1 each 0   CALCIUM PO Take 1 tablet by mouth daily.     Cholecalciferol (VITAMIN D3) 125 MCG (5000 UT) CAPS Take 1 capsule by mouth daily.     Coenzyme Q10 (COQ-10) 100 MG CAPS Take 100 mg by mouth daily.     dofetilide (TIKOSYN) 250 MCG capsule Take 1 capsule (250 mcg total) by mouth 2 (two) times daily. 180 capsule 3   GLUCOSAMINE-CHONDROITIN PO Take 1 tablet by mouth daily.      Magnesium 250 MG TABS Take 1 tablet by mouth daily.     Multiple Vitamin (MULTIVITAMIN) tablet Take 1 tablet by mouth daily.     pantoprazole (PROTONIX) 40 MG tablet Take 1 tablet (40 mg total) by mouth daily. 30 tablet 1   PROLIA 60 MG/ML SOSY injection Inject 60 mg into the skin every 6 (six) months.     XARELTO 20 MG TABS tablet TAKE 1 TABLET(20 MG) BY MOUTH DAILY 90 tablet 1   No current facility-administered medications for this visit.    Allergies  Allergen Reactions   Cyclobenzaprine Nausea Only   Indomethacin Rash     REVIEW OF SYSTEMS:   '[X]'$  denotes positive finding, '[ ]'$  denotes negative finding Cardiac  Comments:  Chest pain or chest pressure:    Shortness of breath upon exertion: X   Short of breath when lying flat:    Irregular heart rhythm:        Vascular    Pain in calf, thigh, or hip brought on by ambulation:    Pain in feet at night that wakes you up from your sleep:     Blood clot in your veins:    Leg swelling:         Pulmonary    Oxygen at home:     Productive cough:     Wheezing:         Neurologic    Sudden weakness in arms or legs:     Sudden numbness in arms or legs:     Sudden onset of difficulty speaking or slurred speech:    Temporary  loss of vision in one eye:     Problems with dizziness:         Gastrointestinal    Blood in stool:     Vomited blood:         Genitourinary    Burning when urinating:     Blood in urine:        Psychiatric    Major depression:         Hematologic    Bleeding problems:    Problems with blood clotting too easily:        Skin    Rashes or ulcers:        Constitutional    Fever or chills:      PHYSICAL EXAMINATION:  Vitals:   06/07/22 0922  BP: (!) 142/88  Pulse: (!) 101  Temp: 97.7 F (36.5 C)  TempSrc: Temporal  SpO2: 97%  Weight: 193 lb (87.5 kg)  Height: '5\' 10"'$  (1.778 m)    General:  WDWN in NAD; vital signs documented above Gait: Normal HENT: WNL, normocephalic Pulmonary: normal non-labored breathing , without wheezing Cardiac: irregular HR, without  Murmurs without carotid bruit Abdomen: soft, ND Vascular Exam/Pulses:  Right Left  Radial 2+ (normal) 2+ (normal)  Femoral 2+ (normal) 2+ (normal)  Popliteal 2+ (normal) 2+ (normal)  DP 2+ (normal) 2+ (normal)  PT 1+ (weak) 1+ (weak)   Extremities: without ischemic changes, without Gangrene , without cellulitis; without open wounds;  Musculoskeletal: no muscle wasting or atrophy  Neurologic: A&O X 3;  No focal weakness or paresthesias are detected Psychiatric:  The pt has Normal affect.   Non-Invasive Vascular Imaging:    Abdominal Aorta Findings:  +-----------+-------+----------+----------+--------+--------+--------+  Location  AP (cm)Trans (cm)PSV (cm/s)WaveformThrombusComments  +-----------+-------+----------+----------+--------+--------+--------+  Proximal  3.03   3.23      96                                  +-----------+-------+----------+----------+--------+--------+--------+   Mid       4.68   5.10      30                                  +-----------+-------+----------+----------+--------+--------+--------+  Distal    2.85   2.98      78                                  +-----------+-------+----------+----------+--------+--------+--------+  RT CIA Prox3.3    3.3       44                                  +-----------+-------+----------+----------+--------+--------+--------+  LT CIA Prox1.1    1.5       129                                 +-----------+-------+----------+----------+--------+--------+--------+   Visualization of the Proximal Abdominal Aorta was limited.   Summary:  Abdominal Aorta: There is evidence of abnormal dilatation of the mid. Abdominal aorta. There is evidence of abnormal dilation of the Right Common Iliac artery. The largest aortic measurement is 5.1 cm. The largest aortic diameter has increased compared to prior  exam. Previous diameter measurement was 4.4 cm obtained on 07/21/2021.    ASSESSMENT/PLAN:: 77 y.o. male here for follow up for of AAA and right common iliac artery aneurysm. He is without any associated symptoms. - Duplex today shows overall stable right CIA aneurysm size. His AAA in mid portion at largest diameter is 5.10, which has increased from his prior duplex 1 year ago at 4.4 cm. For this reason I have recommended CT evaluation -Reviewed with patient and his wife that any new back or abdominal pain would require urgent evaluation in ER and he knows to tell them that he has history of AAA -Will have him scheduled for CTA abdomen and pelvis and he can follow up to review results with Dr. Jeri Lager, PA-C [facility] [phone]  Clinic MD:   [place]/ Dr. Scot Dock

## 2022-06-15 ENCOUNTER — Other Ambulatory Visit: Payer: Self-pay

## 2022-06-15 DIAGNOSIS — I714 Abdominal aortic aneurysm, without rupture, unspecified: Secondary | ICD-10-CM

## 2022-06-30 ENCOUNTER — Telehealth: Payer: Self-pay

## 2022-06-30 ENCOUNTER — Ambulatory Visit (HOSPITAL_BASED_OUTPATIENT_CLINIC_OR_DEPARTMENT_OTHER)
Admission: RE | Admit: 2022-06-30 | Discharge: 2022-06-30 | Disposition: A | Discharge status: Home / Self Care | Payer: Medicare Other | Source: Ambulatory Visit | Attending: Vascular Surgery | Admitting: Vascular Surgery

## 2022-06-30 DIAGNOSIS — I714 Abdominal aortic aneurysm, without rupture, unspecified: Secondary | ICD-10-CM | POA: Diagnosis present

## 2022-06-30 LAB — POCT I-STAT CREATININE: Creatinine, Ser: 1.3 mg/dL — ABNORMAL HIGH (ref 0.61–1.24)

## 2022-06-30 MED ORDER — IOHEXOL 350 MG/ML SOLN
100.0000 mL | Freq: Once | INTRAVENOUS | Status: AC | PRN
  Administered 2022-06-30: 80 mL via INTRAVENOUS

## 2022-06-30 NOTE — Telephone Encounter (Signed)
[facility] Radiology called to ensure CT report visible in Epic.

## 2022-07-05 ENCOUNTER — Telehealth: Payer: Self-pay | Admitting: Interventional Cardiology

## 2022-07-05 ENCOUNTER — Encounter: Payer: Self-pay | Admitting: Vascular Surgery

## 2022-07-05 ENCOUNTER — Ambulatory Visit: Payer: Medicare Other | Admitting: Vascular Surgery

## 2022-07-05 VITALS — BP 153/91 | HR 79 | Temp 98.1°F | Resp 20 | Ht 70.0 in | Wt 193.7 lb

## 2022-07-05 DIAGNOSIS — I714 Abdominal aortic aneurysm, without rupture, unspecified: Secondary | ICD-10-CM

## 2022-07-05 NOTE — Telephone Encounter (Signed)
Dr. Nelly Laurence,  You saw this patient on 06/05/2022. You have scheduled him for afib ablation on 09/06/2022. Vascular surgery is requesting interruption of anticoagulation prior to procedure and plan on scheduling it prior to ablation. Will you please comment on clearance for EVAR for common iliac aneurysm and infrarenal AAA with vascular surgery?  Please route your response to P CV DIV Preop. I will communicate with requesting office once you have given recommendations.   Thank you!  Jonathon Levering, NP

## 2022-07-05 NOTE — Telephone Encounter (Signed)
   Pre-operative Risk Assessment    Patient Name: Jonathon Stevens  DOB: 11-08-1945 MRN: [id number]      Request for Surgical Clearance    Procedure:   EVAR  Date of Surgery:  Clearance TBD                                 Surgeon:  Dr. Devota Pace Group or Practice Name:  [facility]  Phone number:  [phone]  Fax number:  [phone]   Type of Clearance Requested:   - Medical  - Pharmacy:  Hold        Type of Anesthesia:  Not Indicated   Additional requests/questions:   Caller stated the nurse or surgeon can provide further information.  Signed, Annetta Maw   07/05/2022, 10:19 AM

## 2022-07-05 NOTE — Progress Notes (Signed)
Patient ID: Jonathon ClusterGeorge Sak, male   DOB: 1945/06/15, 77 y.o.   MRN: [id number]  Reason for Consult: Follow-up   Referred by Barbie BannerWilson, Fred H, MD  Subjective:     HPI:  Jonathon Stevens is a 77 y.o. male history of abdominal aortic aneurysm and right common iliac artery aneurysm noted to be increased in size on recent ultrasound.  He now follows up with CT scan.  He denies any new back or abdominal pain he is planned for cardiac ablation in June of this year and remains on anticoagulation with Xarelto for atrial flutter.  Denies any chest pain at this time.  Remains at his usual level of activity although has been somewhat not as active lately that [place].  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 ECHO 10/08/13: EF 45-50%, mild hypokinesis of apical myocardium. Mild LA dilation, mild RA dilation;  b.  TEE (10/09/13):  EF 50-55%, normal wall motion, no LAA clot   Hypertension    Stroke    Family History  Problem Relation Age of Onset   Hypertension Mother    Stroke Mother    Hypertension Father    Heart attack Neg Hx    Past Surgical History:  Procedure Laterality Date   CARDIOVERSION N/A 10/13/2021   Procedure: CARDIOVERSION;  Surgeon: Thomasene Rippleobb, Kardie, DO;  Location: [facility] ENDOSCOPY;  Service: Cardiovascular;  Laterality: N/A;   ESOPHAGOGASTRODUODENOSCOPY (EGD) WITH PROPOFOL N/A 02/14/2021   Procedure: ESOPHAGOGASTRODUODENOSCOPY (EGD) WITH PROPOFOL;  Surgeon: Jeani HawkingHung, Patrick, MD;  [facility];  Service: Endoscopy;  Laterality: N/A;   HEMOSTASIS CLIP PLACEMENT  02/14/2021   Procedure: HEMOSTASIS CLIP PLACEMENT;  Surgeon: Jeani HawkingHung, Patrick, MD;  [facility];  Service: Endoscopy;;   TEE WITHOUT CARDIOVERSION N/A 10/09/2013   Procedure: TRANSESOPHAGEAL ECHOCARDIOGRAM (TEE);  Surgeon: Chrystie NoseKenneth C. Hilty, MD;   [facility]COPY;  Service: Cardiovascular;  Laterality: N/A;    Short Social History:  Social History   Tobacco Use   Smoking status: Former    Packs/day: 0.50    Years: 15.00    Additional pack years: 0.00    Total pack years: 7.50    Types: Cigarettes    Quit date: 07/13/2015    Years since quitting: 6.9    Passive exposure: Never   Smokeless tobacco: Never   Tobacco comments:    Former smoker 10/11/21  Substance Use Topics   Alcohol use: Yes    Alcohol/week: 1.0 standard drink of alcohol    Types: 1 Standard drinks or equivalent per week    Comment: 1 beer once a month 04/27/22    Allergies  Allergen Reactions   Cyclobenzaprine Nausea Only   Indomethacin Rash    Current Outpatient Medications  Medication Sig Dispense Refill   acetaminophen (TYLENOL) 650 MG CR tablet Take 1,300 mg by mouth every 8 (eight) hours as needed for pain.     allopurinol (ZYLOPRIM) 300 MG tablet Take 300 mg by mouth daily after breakfast.     atorvastatin (LIPITOR) 10 MG tablet Take 1 tablet (10 mg total) by mouth daily. 90 tablet 3   blood glucose meter kit and supplies KIT Dispense based on patient and insurance preference. Use up to four times daily as directed. (FOR ICD-9 250.00, 250.01). 1 each 0  CALCIUM PO Take 1 tablet by mouth daily.     Cholecalciferol (VITAMIN D3) 125 MCG (5000 UT) CAPS Take 1 capsule by mouth daily.     Coenzyme Q10 (COQ-10) 100 MG CAPS Take 100 mg by mouth daily.     dofetilide (TIKOSYN) 250 MCG capsule Take 1 capsule (250 mcg total) by mouth 2 (two) times daily. 180 capsule 3   GLUCOSAMINE-CHONDROITIN PO Take 1 tablet by mouth daily.      Magnesium 250 MG TABS Take 1 tablet by mouth daily.     Multiple Vitamin (MULTIVITAMIN) tablet Take 1 tablet by mouth daily.     pantoprazole (PROTONIX) 40 MG tablet Take 1 tablet (40 mg total) by mouth daily. 30 tablet 1   XARELTO 20 MG TABS tablet TAKE 1 TABLET(20 MG) BY MOUTH DAILY 90 tablet 1   PROLIA 60 MG/ML SOSY  injection Inject 60 mg into the skin every 6 (six) months. (Patient not taking: Reported on 07/05/2022)     No current facility-administered medications for this visit.    Review of Systems  Constitutional: Positive for fatigue.  HENT: HENT negative.  Eyes: Eyes negative.  Respiratory: Respiratory negative.  Cardiovascular: Cardiovascular negative.  GI: Gastrointestinal negative.  Musculoskeletal: Musculoskeletal negative.  Skin: Skin negative.  Neurological: Neurological negative. Hematologic: Hematologic/lymphatic negative.  Psychiatric: Psychiatric negative.        Objective:  Objective   Vitals:   07/05/22 0840  BP: (!) 153/91  Pulse: 79  Resp: 20  Temp: 98.1 F (36.7 C)  SpO2: 94%  Weight: 193 lb 11.2 oz (87.9 kg)  Height: 5\' 10"  (1.778 m)   Body mass index is 27.79 kg/m.  Physical Exam HENT:     Head: Normocephalic.     Nose: Nose normal.  Eyes:     Pupils: Pupils are equal, round, and reactive to light.  Neck:     Vascular: No carotid bruit.  Cardiovascular:     Pulses: Normal pulses.          Radial pulses are 2+ on the right side and 2+ on the left side.       Femoral pulses are 2+ on the right side and 2+ on the left side. Abdominal:     General: Abdomen is flat.     Palpations: There is mass.  Skin:    General: Skin is warm and dry.     Capillary Refill: Capillary refill takes less than 2 seconds.     Findings: No lesion.  Neurological:     General: No focal deficit present.     Mental Status: He is alert.  Psychiatric:        Mood and Affect: Mood normal.        Thought Content: Thought content normal.        Judgment: Judgment normal.     Data: CT IMPRESSION: VASCULAR   1. 3.6 cm RIGHT common iliac artery aneurysm. Recommend referral to a vascular specialist secondary to increased risk of rupture > 3.5 cm. 2. RIGHT CIA aneurysm intramural hematoma with delayed opacification of the RIGHT pelvic and proximal lower extremity  arteries. Findings increases risk of thromboembolic event within the RIGHT lower extremity. 3. 4.7 cm fusiform infrarenal abdominal aortic aneurysm. Recommend follow-up CT/MR every 6 months and vascular consultation This recommendation follows ACR consensus guidelines: White Paper of the ACR Incidental Findings Committee II on Vascular Findings. J Am Coll Radiol 2013; 10:789-794.        Assessment/Plan:    77 year old  male with right common iliac artery aneurysm which meets criteria for repair also has concomitant infrarenal abdominal aortic aneurysm measuring upwards of 5 cm and the left common and external iliac arteries are normal.  We reviewed his CT scan together today with the presence of his wife.  Plan will be for iliac branch device on the right side and will extend up with standard endovascular aneurysm repair to treat the aortic component.  We discussed his options being continued watchful waiting versus open repair and he is elected for EVAR with IBE.  We will obtain cardiac clearance and likely get this performed prior to ablation so that we can hold anticoagulation for a couple days prior.  All questions were answered.     Maeola Harman MD [facility]

## 2022-07-05 NOTE — H&P (View-Only) (Signed)
 Patient ID: Jonathon Stevens, male   DOB: 01/17/1946, 76 y.o.   MRN: [id number]  Reason for Consult: Follow-up   Referred by Wilson, Fred H, MD  Subjective:     HPI:  Jonathon Stevens is a 76 y.o. male history of abdominal aortic aneurysm and right common iliac artery aneurysm noted to be increased in size on recent ultrasound.  He now follows up with CT scan.  He denies any new back or abdominal pain he is planned for cardiac ablation in June of this year and remains on anticoagulation with Xarelto for atrial flutter.  Denies any chest pain at this time.  Remains at his usual level of activity although has been somewhat not as active lately that [place].  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 ECHO 10/08/13: EF 45-50%, mild hypokinesis of apical myocardium. Mild LA dilation, mild RA dilation;  b.  TEE (10/09/13):  EF 50-55%, normal wall motion, no LAA clot   Hypertension    Stroke    Family History  Problem Relation Age of Onset   Hypertension Mother    Stroke Mother    Hypertension Father    Heart attack Neg Hx    Past Surgical History:  Procedure Laterality Date   CARDIOVERSION N/A 10/13/2021   Procedure: CARDIOVERSION;  Surgeon: Tobb, Kardie, DO;  Location: [facility];  Service: Cardiovascular;  Laterality: N/A;   ESOPHAGOGASTRODUODENOSCOPY (EGD) WITH PROPOFOL N/A 02/14/2021   Procedure: ESOPHAGOGASTRODUODENOSCOPY (EGD) WITH PROPOFOL;  Surgeon: Hung, Patrick, MD;  Location: [facility];  Service: Endoscopy;  Laterality: N/A;   HEMOSTASIS CLIP PLACEMENT  02/14/2021   Procedure: HEMOSTASIS CLIP PLACEMENT;  Surgeon: Hung, Patrick, MD;  Location: [facility];  Service: Endoscopy;;   TEE WITHOUT CARDIOVERSION N/A 10/09/2013   Procedure: TRANSESOPHAGEAL ECHOCARDIOGRAM (TEE);  Surgeon: Kenneth C. Hilty, MD;   Location: [facility];  Service: Cardiovascular;  Laterality: N/A;    Short Social History:  Social History   Tobacco Use   Smoking status: Former    Packs/day: 0.50    Years: 15.00    Additional pack years: 0.00    Total pack years: 7.50    Types: Cigarettes    Quit date: 07/13/2015    Years since quitting: 6.9    Passive exposure: Never   Smokeless tobacco: Never   Tobacco comments:    Former smoker 10/11/21  Substance Use Topics   Alcohol use: Yes    Alcohol/week: 1.0 standard drink of alcohol    Types: 1 Standard drinks or equivalent per week    Comment: 1 beer once a month 04/27/22    Allergies  Allergen Reactions   Cyclobenzaprine Nausea Only   Indomethacin Rash    Current Outpatient Medications  Medication Sig Dispense Refill   acetaminophen (TYLENOL) 650 MG CR tablet Take 1,300 mg by mouth every 8 (eight) hours as needed for pain.     allopurinol (ZYLOPRIM) 300 MG tablet Take 300 mg by mouth daily after breakfast.     atorvastatin (LIPITOR) 10 MG tablet Take 1 tablet (10 mg total) by mouth daily. 90 tablet 3   blood glucose meter kit and supplies KIT Dispense based on patient and insurance preference. Use up to four times daily as directed. (FOR ICD-9 250.00, 250.01). 1 each 0     CALCIUM PO Take 1 tablet by mouth daily.     Cholecalciferol (VITAMIN D3) 125 MCG (5000 UT) CAPS Take 1 capsule by mouth daily.     Coenzyme Q10 (COQ-10) 100 MG CAPS Take 100 mg by mouth daily.     dofetilide (TIKOSYN) 250 MCG capsule Take 1 capsule (250 mcg total) by mouth 2 (two) times daily. 180 capsule 3   GLUCOSAMINE-CHONDROITIN PO Take 1 tablet by mouth daily.      Magnesium 250 MG TABS Take 1 tablet by mouth daily.     Multiple Vitamin (MULTIVITAMIN) tablet Take 1 tablet by mouth daily.     pantoprazole (PROTONIX) 40 MG tablet Take 1 tablet (40 mg total) by mouth daily. 30 tablet 1   XARELTO 20 MG TABS tablet TAKE 1 TABLET(20 MG) BY MOUTH DAILY 90 tablet 1   PROLIA 60 MG/ML SOSY  injection Inject 60 mg into the skin every 6 (six) months. (Patient not taking: Reported on 07/05/2022)     No current facility-administered medications for this visit.    Review of Systems  Constitutional: Positive for fatigue.  HENT: HENT negative.  Eyes: Eyes negative.  Respiratory: Respiratory negative.  Cardiovascular: Cardiovascular negative.  GI: Gastrointestinal negative.  Musculoskeletal: Musculoskeletal negative.  Skin: Skin negative.  Neurological: Neurological negative. Hematologic: Hematologic/lymphatic negative.  Psychiatric: Psychiatric negative.        Objective:  Objective   Vitals:   07/05/22 0840  BP: (!) 153/91  Pulse: 79  Resp: 20  Temp: 98.1 F (36.7 C)  SpO2: 94%  Weight: 193 lb 11.2 oz (87.9 kg)  Height: 5' 10" (1.778 m)   Body mass index is 27.79 kg/m.  Physical Exam HENT:     Head: Normocephalic.     Nose: Nose normal.  Eyes:     Pupils: Pupils are equal, round, and reactive to light.  Neck:     Vascular: No carotid bruit.  Cardiovascular:     Pulses: Normal pulses.          Radial pulses are 2+ on the right side and 2+ on the left side.       Femoral pulses are 2+ on the right side and 2+ on the left side. Abdominal:     General: Abdomen is flat.     Palpations: There is mass.  Skin:    General: Skin is warm and dry.     Capillary Refill: Capillary refill takes less than 2 seconds.     Findings: No lesion.  Neurological:     General: No focal deficit present.     Mental Status: He is alert.  Psychiatric:        Mood and Affect: Mood normal.        Thought Content: Thought content normal.        Judgment: Judgment normal.     Data: CT IMPRESSION: VASCULAR   1. 3.6 cm RIGHT common iliac artery aneurysm. Recommend referral to a vascular specialist secondary to increased risk of rupture > 3.5 cm. 2. RIGHT CIA aneurysm intramural hematoma with delayed opacification of the RIGHT pelvic and proximal lower extremity  arteries. Findings increases risk of thromboembolic event within the RIGHT lower extremity. 3. 4.7 cm fusiform infrarenal abdominal aortic aneurysm. Recommend follow-up CT/MR every 6 months and vascular consultation This recommendation follows ACR consensus guidelines: White Paper of the ACR Incidental Findings Committee II on Vascular Findings. J Am Coll Radiol 2013; 10:789-794.        Assessment/Plan:    76-year-old   male with right common iliac artery aneurysm which meets criteria for repair also has concomitant infrarenal abdominal aortic aneurysm measuring upwards of 5 cm and the left common and external iliac arteries are normal.  We reviewed his CT scan together today with the presence of his wife.  Plan will be for iliac branch device on the right side and will extend up with standard endovascular aneurysm repair to treat the aortic component.  We discussed his options being continued watchful waiting versus open repair and he is elected for EVAR with IBE.  We will obtain cardiac clearance and likely get this performed prior to ablation so that we can hold anticoagulation for a couple days prior.  All questions were answered.     Mehtaab Mayeda Christopher Benigna Delisi MD [facility]   

## 2022-07-06 NOTE — Telephone Encounter (Addendum)
Patient with diagnosis of afib on Xarelto for anticoagulation.    Procedure: endovascular aneurysm repair Date of procedure: TBD  CHA2DS2-VASc Score = 7  This indicates a 11.2% annual risk of stroke. The patient's score is based upon: CHF History: 0 HTN History: 1 Diabetes History: 1 Stroke History: 2 Vascular Disease History: 1 Age Score: 2 Gender Score: 0   CrCl 13mL/min Platelet count 168K  Will likely require 2 day Xarelto hold. Given elevated CV risk including prior stroke, will require MD input. Looks like Dr Nelly Laurence has already commented that 2 day hold is acceptable. Procedure will also need to be completed in the next month ideally as he will require 3 weeks of uninterrupted anticoagulation prior to his afib ablation on 09/06/22.  **This guidance is not considered finalized until pre-operative APP has relayed final recommendations.**

## 2022-07-06 NOTE — Telephone Encounter (Signed)
   Name: Jonathon Stevens  DOB: Apr 27, 1945  MRN: [id number]   Primary Cardiologist: Lance Muss, MD  Chart reviewed as part of pre-operative protocol coverage. [name] was last seen on 06/05/2022 by Dr. Nelly Laurence.  At that time he was scheduled for A-fib ablation on 09/06/2022.  Per correspondence with Dr. Nelly Laurence: "He can hold eliquis for two days prior to the surgery."  Spoke with patient who reports overall he is doing well.  No chest pain, pressure, or tightness. Denies lower extremity edema, orthopnea, or PND.  He reports shortness of breath, DOE and fatigue with A-fib.  He continues to do his ADLs and normal household activities "just at a slower pace."  Therefore, based on ACC/AHA guidelines, the patient would be at acceptable risk for the planned procedure without further cardiovascular testing.   Per Pharm.D.: Patient with diagnosis of afib on Xarelto for anticoagulation.     Procedure: endovascular aneurysm repair Date of procedure: TBD   CHA2DS2-VASc Score = 7  This indicates a 11.2% annual risk of stroke. The patient's score is based upon: CHF History: 0 HTN History: 1 Diabetes History: 1 Stroke History: 2 Vascular Disease History: 1 Age Score: 2 Gender Score: 0   CrCl 52mL/min Platelet count 168K   Will likely require 2 day Xarelto hold. Given elevated CV risk including prior stroke, will require MD input. Looks like Dr Nelly Laurence has already commented that 2 day hold is acceptable. Procedure will also need to be completed in the next month ideally as he will require 3 weeks of uninterrupted anticoagulation prior to his afib ablation on 09/06/22.  I will route this recommendation to the requesting party via Epic fax function and remove from pre-op pool. Please call with questions.  Carlos Levering, NP 07/06/2022, 3:51 PM

## 2022-07-07 ENCOUNTER — Other Ambulatory Visit: Payer: Self-pay

## 2022-07-07 DIAGNOSIS — I714 Abdominal aortic aneurysm, without rupture, unspecified: Secondary | ICD-10-CM

## 2022-07-07 DIAGNOSIS — I723 Aneurysm of iliac artery: Secondary | ICD-10-CM

## 2022-07-13 NOTE — Progress Notes (Addendum)
Surgical Instructions    Your procedure is scheduled on Tuesday, 07/18/22.  Report to [facility] Main Entrance "A" at 5:30 A.M., then check in with the Admitting office.  Call this number if you have problems the morning of surgery:  [phone]   If you have any questions prior to your surgery date call [phone]: Open Monday-Friday 8am-4pm If you experience any cold or flu symptoms such as cough, fever, chills, shortness of breath, etc. between now and your scheduled surgery, please notify us at the above number     Remember:  Do not eat or drink after midnight the night before your surgery     Take these medicines the morning of surgery with A SIP OF WATER:  allopurinol (ZYLOPRIM)  atorvastatin (LIPITOR)  dofetilide (TIKOSYN)  pantoprazole (PROTONIX)    As of today, STOP taking any Aspirin (unless otherwise instructed by your surgeon) Aleve, Naproxen, Ibuprofen, Motrin, Advil, Goody's, BC's, all herbal medications, fish oil, and all vitamins.  Please stop taking XARELTO 2 days prior to surgery. Your last dose will be 07/15/22.  WHAT DO I DO ABOUT MY DIABETES MEDICATION?   Do not take oral diabetes medicines (pills) the morning of surgery.  The day of surgery, do not take other diabetes injectables, including Byetta (exenatide), Bydureon (exenatide ER), Victoza (liraglutide), or Trulicity (dulaglutide).  If your CBG is greater than 220 mg/dL, you may take  of your sliding scale (correction) dose of insulin.   HOW TO MANAGE YOUR DIABETES BEFORE AND AFTER SURGERY  Why is it important to control my blood sugar before and after surgery? Improving blood sugar levels before and after surgery helps healing and can limit problems. A way of improving blood sugar control is eating a healthy diet by:  Eating less sugar and carbohydrates  Increasing activity/exercise  Talking with your doctor about reaching your blood sugar goals High blood sugars (greater than 180 mg/dL) can  raise your risk of infections and slow your recovery, so you will need to focus on controlling your diabetes during the weeks before surgery. Make sure that the doctor who takes care of your diabetes knows about your planned surgery including the date and location.  How do I manage my blood sugar before surgery? Check your blood sugar at least 4 times a day, starting 2 days before surgery, to make sure that the level is not too high or low.  Check your blood sugar the morning of your surgery when you wake up and every 2 hours until you get to the Short Stay unit.  If your blood sugar is less than 70 mg/dL, you will need to treat for low blood sugar: Do not take insulin. Treat a low blood sugar (less than 70 mg/dL) with  cup of clear juice (cranberry or apple), 4 glucose tablets, OR glucose gel. Recheck blood sugar in 15 minutes after treatment (to make sure it is greater than 70 mg/dL). If your blood sugar is not greater than 70 mg/dL on recheck, call [phone] for further instructions. Report your blood sugar to the short stay nurse when you get to Short Stay.  If you are admitted to the hospital after surgery: Your blood sugar will be checked by the staff and you will probably be given insulin after surgery (instead of oral diabetes medicines) to make sure you have good blood sugar levels. The goal for blood sugar control after surgery is 80-180 mg/dL.  Do not wear jewelry or makeup. Do not wear lotions, powders, cologne  or deodorant. Men may shave face and neck. Do not bring valuables to the hospital. Do not wear nail polish, gel polish, artificial nails, or any other type of covering on natural nails (fingers and toes) If you have artificial nails or gel coating that need to be removed by a nail salon, please have this removed prior to surgery. Artificial nails or gel coating may interfere with anesthesia's ability to adequately monitor your vital signs.  [facility] is not  responsible for any belongings or valuables.    Do NOT Smoke (Tobacco/Vaping)  24 hours prior to your procedure  If you use a CPAP at night, you may bring your mask for your overnight stay.   Contacts, glasses, hearing aids, dentures or partials may not be worn into surgery, please bring cases for these belongings   For patients admitted to the hospital, discharge time will be determined by your treatment team.   Patients discharged the day of surgery will not be allowed to drive home, and someone needs to stay with them for 24 hours.   SURGICAL WAITING ROOM VISITATION Patients having surgery or a procedure may have no more than 2 support people in the waiting area - these visitors may rotate.   Children under the age of 48 must have an adult with them who is not the patient. If the patient needs to stay at the hospital during part of their recovery, the visitor guidelines for inpatient rooms apply. Pre-op nurse will coordinate an appropriate time for 1 support person to accompany patient in pre-op.  This support person may not rotate.   Please refer to [email or web address] for the visitor guidelines for Inpatients (after your surgery is over and you are in a regular room).    Special instructions:    Oral Hygiene is also important to reduce your risk of infection.  Remember - BRUSH YOUR TEETH THE MORNING OF SURGERY WITH YOUR REGULAR TOOTHPASTE   [facility]- Preparing For Surgery  Before surgery, you can play an important role. Because skin is not sterile, your skin needs to be as free of germs as possible. You can reduce the number of germs on your skin by washing with CHG (chlorahexidine gluconate) Soap before surgery.  CHG is an antiseptic cleaner which kills germs and bonds with the skin to continue killing germs even after washing.     Please do not use if you have an allergy to CHG or antibacterial soaps. If your skin becomes  reddened/irritated stop using the CHG.  Do not shave (including legs and underarms) for at least 48 hours prior to first CHG shower. It is OK to shave your face.  Please follow these instructions carefully.     Shower the NIGHT BEFORE SURGERY and the MORNING OF SURGERY with CHG Soap.   If you chose to wash your hair, wash your hair first as usual with your normal shampoo. After you shampoo, rinse your hair and body thoroughly to remove the shampoo.  Then [employer] and genitals (private parts) with your normal soap and rinse thoroughly to remove soap.  After that Use CHG Soap as you would any other liquid soap. You can apply CHG directly to the skin and wash gently with a scrungie or a clean washcloth.   Apply the CHG Soap to your body ONLY FROM THE NECK DOWN.  Do not use on open wounds or open sores. Avoid contact with your eyes, ears, mouth and genitals (private parts). Wash Face and genitals (private  parts)  with your normal soap.   Wash thoroughly, paying special attention to the area where your surgery will be performed.  Thoroughly rinse your body with warm water from the neck down.  DO NOT shower/wash with your normal soap after using and rinsing off the CHG Soap.  Pat yourself dry with a CLEAN TOWEL.  Wear CLEAN PAJAMAS to bed the night before surgery  Place CLEAN SHEETS on your bed the night before your surgery  DO NOT SLEEP WITH PETS.   Day of Surgery: Take a shower with CHG soap. Wear Clean/Comfortable clothing the morning of surgery Do not apply any deodorants/lotions.   Remember to brush your teeth WITH YOUR REGULAR TOOTHPASTE.    If you received a COVID test during your pre-op visit, it is requested that you wear a mask when out in public, stay away from anyone that may not be feeling well, and notify your surgeon if you develop symptoms. If you have been in contact with anyone that has tested positive in the last 10 days, please notify your surgeon.    Please read  over the following fact sheets that you were given.

## 2022-07-14 ENCOUNTER — Encounter (HOSPITAL_COMMUNITY)
Admission: RE | Admit: 2022-07-14 | Discharge: 2022-07-14 | Disposition: A | Discharge status: Home / Self Care | Payer: Medicare Other | Source: Ambulatory Visit | Attending: Vascular Surgery | Admitting: Vascular Surgery

## 2022-07-14 ENCOUNTER — Encounter (HOSPITAL_COMMUNITY): Payer: Self-pay

## 2022-07-14 ENCOUNTER — Other Ambulatory Visit: Payer: Self-pay

## 2022-07-14 VITALS — BP 138/111 | HR 66 | Temp 97.7°F | Resp 18 | Ht 70.0 in | Wt 194.8 lb

## 2022-07-14 DIAGNOSIS — I714 Abdominal aortic aneurysm, without rupture, unspecified: Secondary | ICD-10-CM | POA: Diagnosis not present

## 2022-07-14 DIAGNOSIS — Z8679 Personal history of other diseases of the circulatory system: Secondary | ICD-10-CM | POA: Diagnosis not present

## 2022-07-14 DIAGNOSIS — Z01812 Encounter for preprocedural laboratory examination: Secondary | ICD-10-CM | POA: Insufficient documentation

## 2022-07-14 DIAGNOSIS — I10 Essential (primary) hypertension: Secondary | ICD-10-CM | POA: Insufficient documentation

## 2022-07-14 DIAGNOSIS — Z8673 Personal history of transient ischemic attack (TIA), and cerebral infarction without residual deficits: Secondary | ICD-10-CM | POA: Diagnosis not present

## 2022-07-14 DIAGNOSIS — Z01818 Encounter for other preprocedural examination: Secondary | ICD-10-CM

## 2022-07-14 DIAGNOSIS — I723 Aneurysm of iliac artery: Secondary | ICD-10-CM | POA: Diagnosis not present

## 2022-07-14 DIAGNOSIS — I4819 Other persistent atrial fibrillation: Secondary | ICD-10-CM | POA: Insufficient documentation

## 2022-07-14 DIAGNOSIS — R7303 Prediabetes: Secondary | ICD-10-CM | POA: Diagnosis not present

## 2022-07-14 DIAGNOSIS — Z7901 Long term (current) use of anticoagulants: Secondary | ICD-10-CM | POA: Diagnosis not present

## 2022-07-14 HISTORY — DX: Pneumonia, unspecified organism: J18.9

## 2022-07-14 HISTORY — DX: Cardiac arrhythmia, unspecified: I49.9

## 2022-07-14 LAB — COMPREHENSIVE METABOLIC PANEL
ALT: 26 U/L (ref 0–44)
AST: 27 U/L (ref 15–41)
Albumin: 4.5 g/dL (ref 3.5–5.0)
Alkaline Phosphatase: 92 U/L (ref 38–126)
Anion gap: 10 (ref 5–15)
BUN: 13 mg/dL (ref 8–23)
CO2: 26 mmol/L (ref 22–32)
Calcium: 9.8 mg/dL (ref 8.9–10.3)
Chloride: 103 mmol/L (ref 98–111)
Creatinine, Ser: 1.25 mg/dL — ABNORMAL HIGH (ref 0.61–1.24)
GFR, Estimated: 59 mL/min — ABNORMAL LOW (ref >60)
Glucose, Bld: 171 mg/dL — ABNORMAL HIGH (ref 70–99)
Potassium: 4.1 mmol/L (ref 3.5–5.1)
Sodium: 139 mmol/L (ref 135–145)
Total Bilirubin: 1 mg/dL (ref 0.3–1.2)
Total Protein: 7.7 g/dL (ref 6.5–8.1)

## 2022-07-14 LAB — TYPE AND SCREEN
ABO/RH(D): A POS
Antibody Screen: NEGATIVE

## 2022-07-14 LAB — SURGICAL PCR SCREEN
MRSA, PCR: NEGATIVE
Staphylococcus aureus: NEGATIVE

## 2022-07-14 LAB — HEMOGLOBIN A1C
Hgb A1c MFr Bld: 7.9 % — ABNORMAL HIGH (ref 4.8–5.6)
Mean Plasma Glucose: 180.03 mg/dL

## 2022-07-14 LAB — URINALYSIS, ROUTINE W REFLEX MICROSCOPIC
Bacteria, UA: NONE SEEN
Bilirubin Urine: NEGATIVE
Glucose, UA: NEGATIVE mg/dL
Hgb urine dipstick: NEGATIVE
Ketones, ur: NEGATIVE mg/dL
Leukocytes,Ua: NEGATIVE
Nitrite: NEGATIVE
Protein, ur: 30 mg/dL — AB
Specific Gravity, Urine: 1.013 (ref 1.005–1.030)
pH: 6 (ref 5.0–8.0)

## 2022-07-14 LAB — CBC
HCT: 48.6 % (ref 39.0–52.0)
Hemoglobin: 16.1 g/dL (ref 13.0–17.0)
MCH: 29.1 pg (ref 26.0–34.0)
MCHC: 33.1 g/dL (ref 30.0–36.0)
MCV: 87.7 fL (ref 80.0–100.0)
Platelets: 180 10*3/uL (ref 150–400)
RBC: 5.54 MIL/uL (ref 4.22–5.81)
RDW: 13.4 % (ref 11.5–15.5)
WBC: 7.9 10*3/uL (ref 4.0–10.5)
nRBC: 0 % (ref 0.0–0.2)

## 2022-07-14 LAB — PROTIME-INR
INR: 1.4 — ABNORMAL HIGH (ref 0.8–1.2)
Prothrombin Time: 16.6 seconds — ABNORMAL HIGH (ref 11.4–15.2)

## 2022-07-14 LAB — APTT: aPTT: 34 seconds (ref 24–36)

## 2022-07-14 NOTE — Progress Notes (Addendum)
PCP - fred wilson Cardiologist - Lance Muss EP Physician: gregg taylor   PPM/ICD - denies  Chest x-ray - n/a EKG - 06/05/22 Stress Test - 04/20/21 ECHO - 02/13/21 Cardiac Cath - denies  Sleep Study - denies    Stop xarelto for 2 days prior to surgery.  ERAS Protcol -no  COVID TEST- not needed   Anesthesia review: yes, cardiac clearance 07/05/22. Blood pressure elevated at PAT appointment, james burns PA-C notified and instructed me to inform patient to keep checking blood pressures at home and document. If elevated DOS, we can medicate accordingly. According to patient, blood pressures run 130-140s/70-80s at home.   Patient denies shortness of breath, fever, cough and chest pain at PAT appointment   All instructions explained to the patient, with a verbal understanding of the material. Patient agrees to go over the instructions while at home for a better understanding. Patient also instructed to self quarantine after being tested for COVID-19. The opportunity to ask questions was provided.

## 2022-07-14 NOTE — Progress Notes (Signed)
Notified Lucianne Lei at Dr. Franco Nones office of abnormal pt/inr on pre op labs.

## 2022-07-17 NOTE — Progress Notes (Signed)
Anesthesia Chart Review:  Follows cardiology for history of persistent A-fib on Tikosyn and Xarelto, prior CVA, HTN.  Cardiac clearance per telephone encounter 07/06/22 states, "Chart reviewed as part of pre-operative protocol coverage. Jonathon Stevens was last seen on 06/05/2022 by Dr. Nelly Stevens.  At that time he was scheduled for A-fib ablation on 09/06/2022.  Per correspondence with Dr. Nelly Stevens: "He can hold eliquis for two days prior to the surgery."Spoke with patient who reports overall he is doing well.  No chest pain, pressure, or tightness. Denies lower extremity edema, orthopnea, or PND.  He reports shortness of breath, DOE and fatigue with A-fib.  He continues to do his ADLs and normal household activities "just at a slower pace."  Therefore, based on ACC/AHA guidelines, the patient would be at acceptable risk for the planned procedure without further cardiovascular testing.Marland KitchenMarland KitchenWill likely require 2 day Xarelto hold. Given elevated CV risk including prior stroke, will require MD input. Looks like Dr Jonathon Stevens has already commented that 2 day hold is acceptable. Procedure will also need to be completed in the next month ideally as he will require 3 weeks of uninterrupted anticoagulation prior to his afib ablation on 09/06/22. 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  Follows with pulmonology for history of pulmonary fibrosis.  Seen by Dr. 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  PFTs showed mild restriction and moderate diffusion defect.  He was previously on Stiolto but did not see any benefit from it.  Recommended follow-up with high-res CT in 6 months.  Non-insulin-dependent DM2, not well-controlled, A1c 7.9 on preop labs.  Preop labs reviewed, mildly elevated 1.25, PT/INR mildly elevated at 16.6/1.4.  Surgeons office made aware of lab results.  EKG 06/05/2022: Sinus rhythm with  first-degree AV block with PACs.  Rate 59.  CTE high-resolution chest 06/30/2022: IMPRESSION: 1. Predominantly interlobular basilar subpleural septal thickening with trace intralobular subpleural reticulation. Early/mild interstitial lung disease, such as usual interstitial pneumonitis or nonspecific interstitial pneumonitis, cannot be excluded. Findings are indeterminate for UIP per consensus guidelines: Diagnosis of Idiopathic Pulmonary Fibrosis: An Official ATS/ERS/JRS/ALAT Clinical Practice Guideline. Am Jonathon Stevens Crit Care Med Vol 198, Iss 5, [phone], Nov 25 2016. 2. Tiny pulmonary nodules measure up to 4 mm. No follow-up needed if patient is low-risk (and has no known or suspected primary neoplasm). Non-contrast chest CT can be considered in 12 months if patient is high-risk. This recommendation follows the consensus statement: Guidelines for Management of Incidental Pulmonary Nodules Detected on CT Images: From the Fleischner Society 2017; Radiology 2017; 284:228-243. 3. Aortic atherosclerosis (ICD10-I70.0). Coronary artery calcification. 4. Enlarged pulmonic trunk, indicative of pulmonary arterial hypertension. 5.  Emphysema (ICD10-J43.9).  Nuclear stress 04/20/2021:   The study is normal. The study is low risk.   No ST deviation was noted.   LV perfusion is normal. There is no evidence of ischemia. There is no evidence of infarction.   Left ventricular function is normal. Nuclear stress EF: 71 %. The left ventricular ejection fraction is hyperdynamic (>65%). End diastolic cavity size is normal.   Prior study not available for comparison.  TTE 02/13/2021:  1. Left ventricular ejection fraction, by estimation, is 70 to 75%. The  left ventricle has hyperdynamic function. The left ventricle has no  regional wall motion abnormalities. There is mild left ventricular  hypertrophy. Left ventricular diastolic  function could not be evaluated.  2. Right ventricular systolic  function is normal. The right ventricular  size is normal.   3. Left atrial size was moderately dilated.   4. The mitral valve is normal in structure. Mild mitral valve  regurgitation. No evidence of mitral stenosis.   5. The aortic valve is tricuspid. Aortic valve regurgitation is not  visualized. Aortic valve sclerosis is present, with no evidence of aortic  valve stenosis.   6. The inferior vena cava is normal in size with greater than 50%  respiratory variability, suggesting right atrial pressure of 3 mmHg.     [name] [facility] Short Stay Center/Anesthesiology Phone [phone] 07/17/2022 10:28 AM

## 2022-07-17 NOTE — Anesthesia Preprocedure Evaluation (Addendum)
Anesthesia Evaluation  Patient identified by MRN, date of birth, ID band Patient awake    Reviewed: Allergy & Precautions, NPO status , Patient's Chart, lab work & pertinent test results  Airway Mallampati: II  TM Distance: >3 FB Neck ROM: Full    Dental no notable dental hx. (+) Edentulous Upper, Edentulous Lower   Pulmonary COPD, former smoker   Pulmonary exam normal        Cardiovascular hypertension, + Peripheral Vascular Disease (AAA)  + dysrhythmias Atrial Fibrillation  Rhythm:Regular Rate:Normal     Neuro/Psych CVA  negative psych ROS   GI/Hepatic Neg liver ROS,GERD  Medicated,,  Endo/Other  diabetes, Type 2    Renal/GU negative Renal ROS  negative genitourinary   Musculoskeletal   Abdominal Normal abdominal exam  (+)   Peds  Hematology  (+) Blood dyscrasia, anemia Lab Results      Component                Value               Date                      WBC                      7.9                 07/14/2022                HGB                      16.1                07/14/2022                HCT                      48.6                07/14/2022                MCV                      87.7                07/14/2022                PLT                      180                 07/14/2022             Lab Results      Component                Value               Date                      NA                       139                 07/14/2022                K  4.1                 07/14/2022                CO2                      26                  07/14/2022                GLUCOSE                  171 (H)             07/14/2022                BUN                      13                  07/14/2022                CREATININE               1.25 (H)            07/14/2022                CALCIUM                  9.8                 07/14/2022                EGFR                     51 (L)               08/17/2021                GFRNONAA                 59 (L)              07/14/2022              Anesthesia Other Findings   Reproductive/Obstetrics                             Anesthesia Physical Anesthesia Plan  ASA: 3  Anesthesia Plan: General   Post-op Pain Management:    Induction: Intravenous  PONV Risk Score and Plan: 2 and Ondansetron, Dexamethasone and Treatment may vary due to age or medical condition  Airway Management Planned: Mask and Oral ETT  Additional Equipment: Arterial line  Intra-op Plan:   Post-operative Plan: Extubation in OR  Informed Consent: I have reviewed the patients History and Physical, chart, labs and discussed the procedure including the risks, benefits and alternatives for the proposed anesthesia with the patient or authorized representative who has indicated his/her understanding and acceptance.     Dental advisory given  Plan Discussed with: CRNA  Anesthesia Plan Comments: (PAT note by Antionette Poles, PA-C:  Follows cardiology for history of persistent A-fib on Tikosyn and Xarelto, prior CVA, HTN.  Cardiac clearance per telephone encounter 07/06/22 states, "Chart reviewed as part of pre-operative protocol coverage. Harman Langhans was last seen on 06/05/2022 by Dr. Nelly Laurence.  At that time he was scheduled for A-fib ablation on 09/06/2022.  Per correspondence with Dr. Nelly Laurence: "He can hold eliquis for two days prior to the surgery."Spoke with patient who reports overall he is doing well.  No chest pain, pressure, or tightness. Denies lower extremity edema, orthopnea, or PND.  He reports shortness of breath, DOE and fatigue with A-fib.  He continues to do his ADLs and normal household activities "just at a slower pace."  Therefore, based on ACC/AHA guidelines, the patient would be at acceptable risk for the planned procedure without further cardiovascular testing.Marland KitchenMarland KitchenWill likely require 2 day Xarelto hold. Given elevated CV risk  including prior stroke, will require MD input. Looks like Dr Nelly Laurence has already commented that 2 day hold is acceptable. Procedure will also need to be completed in the next month ideally as he will require 3 weeks of uninterrupted anticoagulation prior to his afib ablation on 09/06/22. 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  Follows with pulmonology for history of pulmonary fibrosis.  Seen by Dr. 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  PFTs showed mild restriction and moderate diffusion defect.  He was previously on Stiolto but did not see any benefit from it.  Recommended follow-up with high-res CT in 6 months.  Non-insulin-dependent DM2, not well-controlled, A1c 7.9 on preop labs.  Preop labs reviewed, mildly elevated 1.25, PT/INR mildly elevated at 16.6/1.4.  Surgeons office made aware of lab results.  EKG 06/05/2022: Sinus rhythm with first-degree AV block with PACs.  Rate 59.  CTE high-resolution chest 06/30/2022: IMPRESSION: 1. Predominantly interlobular basilar subpleural septal thickening with trace intralobular subpleural reticulation. Early/mild interstitial lung disease, such as usual interstitial pneumonitis or nonspecific interstitial pneumonitis, cannot be excluded. Findings are indeterminate for UIP per consensus guidelines: Diagnosis of Idiopathic Pulmonary Fibrosis: An Official ATS/ERS/JRS/ALAT Clinical Practice Guideline. Am Rosezetta Schlatter Crit Care Med Vol 198, Iss 5, [phone], Nov 25 2016. 2. Tiny pulmonary nodules measure up to 4 mm. No follow-up needed if patient is low-risk (and has no known or suspected primary neoplasm). Non-contrast chest CT can be considered in 12 months if patient is high-risk. This recommendation follows the consensus statement: Guidelines for Management of Incidental Pulmonary Nodules Detected on CT Images: From the Fleischner  Society 2017; Radiology 2017; 284:228-243. 3. Aortic atherosclerosis (ICD10-I70.0). Coronary artery calcification. 4. Enlarged pulmonic trunk, indicative of pulmonary arterial hypertension. 5.  Emphysema (ICD10-J43.9).  Nuclear stress 04/20/2021:   The study is normal. The study is low risk.   No ST deviation was noted.   LV perfusion is normal. There is no evidence of ischemia. There is no evidence of infarction.   Left ventricular function is normal. Nuclear stress EF: 71 %. The left ventricular ejection fraction is hyperdynamic (>65%). End diastolic cavity size is normal.   Prior study not available for comparison.  TTE 02/13/2021: 1. Left ventricular ejection fraction, by estimation, is 70 to 75%. The  left ventricle has hyperdynamic function. The left ventricle has no  regional wall motion abnormalities. There is mild left ventricular  hypertrophy. Left ventricular diastolic  function could not be evaluated.  2. Right ventricular systolic function is normal. The right ventricular  size is normal.  3. Left atrial size was moderately dilated.  4. The mitral valve is normal in structure. Mild mitral valve  regurgitation. No evidence of mitral stenosis.  5. The aortic valve is tricuspid. Aortic valve regurgitation is not  visualized. Aortic valve  sclerosis is present, with no evidence of aortic  valve stenosis.  6. The inferior vena cava is normal in size with greater than 50%  respiratory variability, suggesting right atrial pressure of 3 mmHg.     )        Anesthesia Quick Evaluation

## 2022-07-18 ENCOUNTER — Other Ambulatory Visit: Payer: Self-pay

## 2022-07-18 ENCOUNTER — Inpatient Hospital Stay (HOSPITAL_COMMUNITY): Payer: Medicare Other

## 2022-07-18 ENCOUNTER — Encounter (HOSPITAL_COMMUNITY): Payer: Self-pay | Admitting: Vascular Surgery

## 2022-07-18 ENCOUNTER — Inpatient Hospital Stay (HOSPITAL_COMMUNITY)
Admission: RE | Admit: 2022-07-18 | Discharge: 2022-07-19 | DRG: 269 | Disposition: A | Discharge status: Home Health | Payer: Medicare Other | Attending: Vascular Surgery | Admitting: Vascular Surgery

## 2022-07-18 ENCOUNTER — Inpatient Hospital Stay (HOSPITAL_COMMUNITY): Payer: Medicare Other | Admitting: Physician Assistant

## 2022-07-18 ENCOUNTER — Encounter (HOSPITAL_COMMUNITY)
Admission: RE | Disposition: A | Discharge status: Home Health | Payer: Self-pay | Source: Home / Self Care | Attending: Vascular Surgery

## 2022-07-18 ENCOUNTER — Inpatient Hospital Stay (HOSPITAL_COMMUNITY): Payer: Medicare Other | Admitting: Certified Registered Nurse Anesthetist

## 2022-07-18 DIAGNOSIS — I10 Essential (primary) hypertension: Secondary | ICD-10-CM | POA: Diagnosis present

## 2022-07-18 DIAGNOSIS — E785 Hyperlipidemia, unspecified: Secondary | ICD-10-CM | POA: Diagnosis present

## 2022-07-18 DIAGNOSIS — I4892 Unspecified atrial flutter: Secondary | ICD-10-CM | POA: Diagnosis present

## 2022-07-18 DIAGNOSIS — Z8616 Personal history of COVID-19: Secondary | ICD-10-CM

## 2022-07-18 DIAGNOSIS — E1151 Type 2 diabetes mellitus with diabetic peripheral angiopathy without gangrene: Secondary | ICD-10-CM

## 2022-07-18 DIAGNOSIS — Z8249 Family history of ischemic heart disease and other diseases of the circulatory system: Secondary | ICD-10-CM | POA: Diagnosis not present

## 2022-07-18 DIAGNOSIS — Z823 Family history of stroke: Secondary | ICD-10-CM

## 2022-07-18 DIAGNOSIS — I714 Abdominal aortic aneurysm, without rupture, unspecified: Secondary | ICD-10-CM

## 2022-07-18 DIAGNOSIS — J449 Chronic obstructive pulmonary disease, unspecified: Secondary | ICD-10-CM

## 2022-07-18 DIAGNOSIS — Z7901 Long term (current) use of anticoagulants: Secondary | ICD-10-CM | POA: Diagnosis not present

## 2022-07-18 DIAGNOSIS — Z9889 Other specified postprocedural states: Principal | ICD-10-CM

## 2022-07-18 DIAGNOSIS — Z8701 Personal history of pneumonia (recurrent): Secondary | ICD-10-CM | POA: Diagnosis not present

## 2022-07-18 DIAGNOSIS — E119 Type 2 diabetes mellitus without complications: Secondary | ICD-10-CM | POA: Diagnosis present

## 2022-07-18 DIAGNOSIS — Z8673 Personal history of transient ischemic attack (TIA), and cerebral infarction without residual deficits: Secondary | ICD-10-CM | POA: Diagnosis not present

## 2022-07-18 DIAGNOSIS — D62 Acute posthemorrhagic anemia: Secondary | ICD-10-CM | POA: Diagnosis not present

## 2022-07-18 DIAGNOSIS — I723 Aneurysm of iliac artery: Secondary | ICD-10-CM

## 2022-07-18 DIAGNOSIS — Z79899 Other long term (current) drug therapy: Secondary | ICD-10-CM | POA: Diagnosis not present

## 2022-07-18 DIAGNOSIS — Z87891 Personal history of nicotine dependence: Secondary | ICD-10-CM

## 2022-07-18 DIAGNOSIS — R7303 Prediabetes: Secondary | ICD-10-CM

## 2022-07-18 DIAGNOSIS — I7143 Infrarenal abdominal aortic aneurysm, without rupture: Secondary | ICD-10-CM | POA: Diagnosis present

## 2022-07-18 DIAGNOSIS — Z8679 Personal history of other diseases of the circulatory system: Principal | ICD-10-CM

## 2022-07-18 DIAGNOSIS — I4891 Unspecified atrial fibrillation: Secondary | ICD-10-CM | POA: Diagnosis present

## 2022-07-18 HISTORY — PX: INSERTION OF ILIAC STENT: SHX6256

## 2022-07-18 HISTORY — PX: ABDOMINAL AORTIC ENDOVASCULAR STENT GRAFT: SHX5707

## 2022-07-18 LAB — BASIC METABOLIC PANEL
Anion gap: 8 (ref 5–15)
BUN: 14 mg/dL (ref 8–23)
CO2: 23 mmol/L (ref 22–32)
Calcium: 8.6 mg/dL — ABNORMAL LOW (ref 8.9–10.3)
Chloride: 107 mmol/L (ref 98–111)
Creatinine, Ser: 1.35 mg/dL — ABNORMAL HIGH (ref 0.61–1.24)
GFR, Estimated: 54 mL/min — ABNORMAL LOW (ref >60)
Glucose, Bld: 212 mg/dL — ABNORMAL HIGH (ref 70–99)
Potassium: 4.3 mmol/L (ref 3.5–5.1)
Sodium: 138 mmol/L (ref 135–145)

## 2022-07-18 LAB — POCT ACTIVATED CLOTTING TIME
Activated Clotting Time: 212 seconds
Activated Clotting Time: 244 seconds

## 2022-07-18 LAB — GLUCOSE, CAPILLARY
Glucose-Capillary: 154 mg/dL — ABNORMAL HIGH (ref 70–99)
Glucose-Capillary: 181 mg/dL — ABNORMAL HIGH (ref 70–99)
Glucose-Capillary: 211 mg/dL — ABNORMAL HIGH (ref 70–99)
Glucose-Capillary: 283 mg/dL — ABNORMAL HIGH (ref 70–99)
Glucose-Capillary: 291 mg/dL — ABNORMAL HIGH (ref 70–99)

## 2022-07-18 LAB — PROTIME-INR
INR: 1.2 (ref 0.8–1.2)
Prothrombin Time: 15.3 seconds — ABNORMAL HIGH (ref 11.4–15.2)

## 2022-07-18 LAB — CBC
HCT: 37.8 % — ABNORMAL LOW (ref 39.0–52.0)
Hemoglobin: 13.1 g/dL (ref 13.0–17.0)
MCH: 30 pg (ref 26.0–34.0)
MCHC: 34.7 g/dL (ref 30.0–36.0)
MCV: 86.5 fL (ref 80.0–100.0)
Platelets: 136 10*3/uL — ABNORMAL LOW (ref 150–400)
RBC: 4.37 MIL/uL (ref 4.22–5.81)
RDW: 13.4 % (ref 11.5–15.5)
WBC: 9.3 10*3/uL (ref 4.0–10.5)
nRBC: 0 % (ref 0.0–0.2)

## 2022-07-18 LAB — APTT: aPTT: 35 seconds (ref 24–36)

## 2022-07-18 LAB — MAGNESIUM: Magnesium: 1.9 mg/dL (ref 1.7–2.4)

## 2022-07-18 SURGERY — INSERTION, ENDOVASCULAR STENT GRAFT, AORTA, ABDOMINAL
Anesthesia: General | Laterality: Right

## 2022-07-18 MED ORDER — FENTANYL CITRATE (PF) 250 MCG/5ML IJ SOLN
INTRAMUSCULAR | Status: DC | PRN
  Administered 2022-07-18 (×2): 50 ug via INTRAVENOUS
  Administered 2022-07-18: 100 ug via INTRAVENOUS
  Administered 2022-07-18: 50 ug via INTRAVENOUS

## 2022-07-18 MED ORDER — MORPHINE SULFATE (PF) 2 MG/ML IV SOLN: 2.0000 mg | INTRAVENOUS | Status: DC | PRN

## 2022-07-18 MED ORDER — ALUM & MAG HYDROXIDE-SIMETH 200-200-20 MG/5ML PO SUSP: 15.0000 mL | ORAL | Status: DC | PRN

## 2022-07-18 MED ORDER — PROTAMINE SULFATE 10 MG/ML IV SOLN
INTRAVENOUS | Status: DC | PRN
  Administered 2022-07-18: 50 mg via INTRAVENOUS

## 2022-07-18 MED ORDER — LACTATED RINGERS IV SOLN: INTRAVENOUS | Status: DC | PRN

## 2022-07-18 MED ORDER — PHENYLEPHRINE HCL-NACL 20-0.9 MG/250ML-% IV SOLN
INTRAVENOUS | Status: DC | PRN
  Administered 2022-07-18: 25 ug/min via INTRAVENOUS
  Administered 2022-07-18: 40 ug/min via INTRAVENOUS

## 2022-07-18 MED ORDER — SODIUM CHLORIDE 0.9 % IV SOLN: 500.0000 mL | Freq: Once | INTRAVENOUS | Status: DC | PRN

## 2022-07-18 MED ORDER — LACTATED RINGERS IV SOLN: INTRAVENOUS | Status: DC

## 2022-07-18 MED ORDER — DEXAMETHASONE SODIUM PHOSPHATE 10 MG/ML IJ SOLN
INTRAMUSCULAR | Status: AC
  Filled 2022-07-18: qty 1

## 2022-07-18 MED ORDER — LABETALOL HCL 5 MG/ML IV SOLN: 10.0000 mg | INTRAVENOUS | Status: DC | PRN

## 2022-07-18 MED ORDER — SODIUM CHLORIDE 0.9 % IV SOLN: INTRAVENOUS | Status: DC

## 2022-07-18 MED ORDER — PHENYLEPHRINE 80 MCG/ML (10ML) SYRINGE FOR IV PUSH (FOR BLOOD PRESSURE SUPPORT)
PREFILLED_SYRINGE | INTRAVENOUS | Status: AC
  Filled 2022-07-18: qty 10

## 2022-07-18 MED ORDER — CEFAZOLIN SODIUM-DEXTROSE 2-4 GM/100ML-% IV SOLN
2.0000 g | Freq: Three times a day (TID) | INTRAVENOUS | Status: AC
  Administered 2022-07-18 – 2022-07-19 (×2): 2 g via INTRAVENOUS
  Filled 2022-07-18 (×2): qty 100

## 2022-07-18 MED ORDER — MAGNESIUM SULFATE 2 GM/50ML IV SOLN: 2.0000 g | Freq: Every day | INTRAVENOUS | Status: DC | PRN

## 2022-07-18 MED ORDER — SUGAMMADEX SODIUM 200 MG/2ML IV SOLN
INTRAVENOUS | Status: DC | PRN
  Administered 2022-07-18: 200 mg via INTRAVENOUS

## 2022-07-18 MED ORDER — FENTANYL CITRATE (PF) 100 MCG/2ML IJ SOLN: 25.0000 ug | INTRAMUSCULAR | Status: DC | PRN

## 2022-07-18 MED ORDER — IODIXANOL 320 MG/ML IV SOLN
INTRAVENOUS | Status: DC | PRN
  Administered 2022-07-18: 95 mL

## 2022-07-18 MED ORDER — PROTAMINE SULFATE 10 MG/ML IV SOLN
INTRAVENOUS | Status: AC
  Filled 2022-07-18: qty 5

## 2022-07-18 MED ORDER — HEPARIN SODIUM (PORCINE) 5000 UNIT/ML IJ SOLN
5000.0000 [IU] | Freq: Three times a day (TID) | INTRAMUSCULAR | Status: DC
  Administered 2022-07-19: 5000 [IU] via SUBCUTANEOUS
  Filled 2022-07-18: qty 1

## 2022-07-18 MED ORDER — PANTOPRAZOLE SODIUM 40 MG PO TBEC
40.0000 mg | DELAYED_RELEASE_TABLET | Freq: Every day | ORAL | Status: DC
  Administered 2022-07-19: 40 mg via ORAL
  Filled 2022-07-18: qty 1

## 2022-07-18 MED ORDER — ONDANSETRON HCL 4 MG/2ML IJ SOLN
INTRAMUSCULAR | Status: AC
  Filled 2022-07-18: qty 2

## 2022-07-18 MED ORDER — PHENYLEPHRINE 80 MCG/ML (10ML) SYRINGE FOR IV PUSH (FOR BLOOD PRESSURE SUPPORT)
PREFILLED_SYRINGE | INTRAVENOUS | Status: DC | PRN
  Administered 2022-07-18 (×4): 160 ug via INTRAVENOUS

## 2022-07-18 MED ORDER — GUAIFENESIN-DM 100-10 MG/5ML PO SYRP: 15.0000 mL | ORAL_SOLUTION | ORAL | Status: DC | PRN

## 2022-07-18 MED ORDER — ROCURONIUM BROMIDE 10 MG/ML (PF) SYRINGE
PREFILLED_SYRINGE | INTRAVENOUS | Status: AC
  Filled 2022-07-18: qty 10

## 2022-07-18 MED ORDER — HEPARIN SODIUM (PORCINE) 1000 UNIT/ML IJ SOLN
INTRAMUSCULAR | Status: AC
  Filled 2022-07-18: qty 10

## 2022-07-18 MED ORDER — PROPOFOL 10 MG/ML IV BOLUS
INTRAVENOUS | Status: DC | PRN
  Administered 2022-07-18: 150 mg via INTRAVENOUS
  Administered 2022-07-18: 30 mg via INTRAVENOUS
  Administered 2022-07-18: 20 mg via INTRAVENOUS

## 2022-07-18 MED ORDER — FENTANYL CITRATE (PF) 250 MCG/5ML IJ SOLN
INTRAMUSCULAR | Status: AC
  Filled 2022-07-18: qty 5

## 2022-07-18 MED ORDER — ONDANSETRON HCL 4 MG/2ML IJ SOLN: 4.0000 mg | Freq: Four times a day (QID) | INTRAMUSCULAR | Status: DC | PRN

## 2022-07-18 MED ORDER — CHLORHEXIDINE GLUCONATE 0.12 % MT SOLN
15.0000 mL | Freq: Once | OROMUCOSAL | Status: AC
  Administered 2022-07-18: 15 mL via OROMUCOSAL
  Filled 2022-07-18: qty 15

## 2022-07-18 MED ORDER — 0.9 % SODIUM CHLORIDE (POUR BTL) OPTIME
TOPICAL | Status: DC | PRN
  Administered 2022-07-18: 1000 mL

## 2022-07-18 MED ORDER — ACETAMINOPHEN 10 MG/ML IV SOLN: 1000.0000 mg | Freq: Once | INTRAVENOUS | Status: DC | PRN

## 2022-07-18 MED ORDER — ATORVASTATIN CALCIUM 10 MG PO TABS
10.0000 mg | ORAL_TABLET | Freq: Every day | ORAL | Status: DC
  Administered 2022-07-19: 10 mg via ORAL
  Filled 2022-07-18: qty 1

## 2022-07-18 MED ORDER — DOFETILIDE 250 MCG PO CAPS
250.0000 ug | ORAL_CAPSULE | Freq: Two times a day (BID) | ORAL | Status: DC
  Administered 2022-07-18 – 2022-07-19 (×2): 250 ug via ORAL
  Filled 2022-07-18 (×2): qty 1

## 2022-07-18 MED ORDER — ORAL CARE MOUTH RINSE: 15.0000 mL | Freq: Once | OROMUCOSAL | Status: AC

## 2022-07-18 MED ORDER — LIDOCAINE 2% (20 MG/ML) 5 ML SYRINGE
INTRAMUSCULAR | Status: DC | PRN
  Administered 2022-07-18: 60 mg via INTRAVENOUS

## 2022-07-18 MED ORDER — DOFETILIDE 250 MCG PO CAPS: 250.0000 ug | ORAL_CAPSULE | Freq: Two times a day (BID) | ORAL | Status: DC

## 2022-07-18 MED ORDER — OXYCODONE-ACETAMINOPHEN 5-325 MG PO TABS: 1.0000 | ORAL_TABLET | ORAL | Status: DC | PRN

## 2022-07-18 MED ORDER — CEFAZOLIN SODIUM-DEXTROSE 2-4 GM/100ML-% IV SOLN
2.0000 g | INTRAVENOUS | Status: AC
  Administered 2022-07-18: 2 g via INTRAVENOUS
  Filled 2022-07-18: qty 100

## 2022-07-18 MED ORDER — POTASSIUM CHLORIDE CRYS ER 20 MEQ PO TBCR: 20.0000 meq | EXTENDED_RELEASE_TABLET | Freq: Every day | ORAL | Status: DC | PRN

## 2022-07-18 MED ORDER — ESMOLOL HCL 100 MG/10ML IV SOLN
INTRAVENOUS | Status: DC | PRN
  Administered 2022-07-18 (×2): 30 mg via INTRAVENOUS
  Administered 2022-07-18 (×2): 20 mg via INTRAVENOUS

## 2022-07-18 MED ORDER — METOPROLOL TARTRATE 5 MG/5ML IV SOLN: 2.0000 mg | INTRAVENOUS | Status: DC | PRN

## 2022-07-18 MED ORDER — HYDRALAZINE HCL 20 MG/ML IJ SOLN: 5.0000 mg | INTRAMUSCULAR | Status: DC | PRN

## 2022-07-18 MED ORDER — PROPOFOL 10 MG/ML IV BOLUS
INTRAVENOUS | Status: AC
  Filled 2022-07-18: qty 20

## 2022-07-18 MED ORDER — HEPARIN SODIUM (PORCINE) 1000 UNIT/ML IJ SOLN
INTRAMUSCULAR | Status: DC | PRN
  Administered 2022-07-18: 9000 [IU] via INTRAVENOUS
  Administered 2022-07-18: 4000 [IU] via INTRAVENOUS

## 2022-07-18 MED ORDER — ONDANSETRON HCL 4 MG/2ML IJ SOLN
INTRAMUSCULAR | Status: DC | PRN
  Administered 2022-07-18: 4 mg via INTRAVENOUS

## 2022-07-18 MED ORDER — HEPARIN 6000 UNIT IRRIGATION SOLUTION
Status: DC | PRN
  Administered 2022-07-18: 1

## 2022-07-18 MED ORDER — DOCUSATE SODIUM 100 MG PO CAPS
100.0000 mg | ORAL_CAPSULE | Freq: Every day | ORAL | Status: DC
  Administered 2022-07-19: 100 mg via ORAL
  Filled 2022-07-18: qty 1

## 2022-07-18 MED ORDER — CHLORHEXIDINE GLUCONATE CLOTH 2 % EX PADS: 6.0000 | MEDICATED_PAD | Freq: Once | CUTANEOUS | Status: DC

## 2022-07-18 MED ORDER — LABETALOL HCL 5 MG/ML IV SOLN
INTRAVENOUS | Status: DC | PRN
  Administered 2022-07-18: 5 mg via INTRAVENOUS
  Administered 2022-07-18: 7.5 mg via INTRAVENOUS
  Administered 2022-07-18: 2.5 mg via INTRAVENOUS
  Administered 2022-07-18: 5 mg via INTRAVENOUS

## 2022-07-18 MED ORDER — ACETAMINOPHEN 325 MG PO TABS: 325.0000 mg | ORAL_TABLET | ORAL | Status: DC | PRN

## 2022-07-18 MED ORDER — ROCURONIUM BROMIDE 10 MG/ML (PF) SYRINGE
PREFILLED_SYRINGE | INTRAVENOUS | Status: DC | PRN
  Administered 2022-07-18 (×2): 20 mg via INTRAVENOUS
  Administered 2022-07-18: 60 mg via INTRAVENOUS

## 2022-07-18 MED ORDER — ALLOPURINOL 300 MG PO TABS
150.0000 mg | ORAL_TABLET | Freq: Every day | ORAL | Status: DC
  Administered 2022-07-19: 150 mg via ORAL
  Filled 2022-07-18: qty 1

## 2022-07-18 MED ORDER — MAGNESIUM SULFATE IN D5W 1-5 GM/100ML-% IV SOLN
1.0000 g | Freq: Once | INTRAVENOUS | Status: AC
  Administered 2022-07-18: 1 g via INTRAVENOUS
  Filled 2022-07-18: qty 100

## 2022-07-18 MED ORDER — PHENOL 1.4 % MT LIQD: 1.0000 | OROMUCOSAL | Status: DC | PRN

## 2022-07-18 MED ORDER — DEXAMETHASONE SODIUM PHOSPHATE 10 MG/ML IJ SOLN
INTRAMUSCULAR | Status: DC | PRN
  Administered 2022-07-18: 5 mg via INTRAVENOUS

## 2022-07-18 MED ORDER — CEFAZOLIN SODIUM 1 G IJ SOLR
INTRAMUSCULAR | Status: AC
  Filled 2022-07-18: qty 20

## 2022-07-18 MED ORDER — LABETALOL HCL 5 MG/ML IV SOLN
INTRAVENOUS | Status: AC
  Filled 2022-07-18: qty 4

## 2022-07-18 MED ORDER — LIDOCAINE 2% (20 MG/ML) 5 ML SYRINGE
INTRAMUSCULAR | Status: AC
  Filled 2022-07-18: qty 5

## 2022-07-18 MED ORDER — ACETAMINOPHEN 650 MG RE SUPP: 325.0000 mg | RECTAL | Status: DC | PRN

## 2022-07-18 MED ORDER — INSULIN ASPART 100 UNIT/ML IJ SOLN: 0.0000 [IU] | INTRAMUSCULAR | Status: DC | PRN

## 2022-07-18 SURGICAL SUPPLY — 61 items
ADH SKN CLS APL DERMABOND .7 (GAUZE/BANDAGES/DRESSINGS) ×2
APL PRP STRL LF DISP 70% ISPRP (MISCELLANEOUS) ×2
BAG COUNTER SPONGE SURGICOUNT (BAG) ×2 IMPLANT
BAG SPNG CNTER NS LX DISP (BAG) ×2
BLADE CLIPPER SURG (BLADE) ×2 IMPLANT
CANISTER SUCT 3000ML PPV (MISCELLANEOUS) ×2 IMPLANT
CATH ANGIO 5F BER2 65CM (CATHETERS) IMPLANT
CATH BALLOON XXL 14X20X120 (BALLOONS) IMPLANT
CHLORAPREP W/TINT 26 (MISCELLANEOUS) ×2 IMPLANT
DERMABOND ADVANCED .7 DNX12 (GAUZE/BANDAGES/DRESSINGS) ×2 IMPLANT
DEVICE CLOSURE PERCLS PRGLD 6F (VASCULAR PRODUCTS) ×8 IMPLANT
DEVICE ENSNARE  12MMX20MM (VASCULAR PRODUCTS) ×2
DEVICE ENSNARE 12MMX20MM (VASCULAR PRODUCTS) IMPLANT
DEVICE TORQUE KENDALL .025-038 (MISCELLANEOUS) IMPLANT
DRSG TEGADERM 2-3/8X2-3/4 SM (GAUZE/BANDAGES/DRESSINGS) ×4 IMPLANT
ELECT REM PT RETURN 9FT ADLT (ELECTROSURGICAL) ×4
ELECTRODE REM PT RTRN 9FT ADLT (ELECTROSURGICAL) ×4 IMPLANT
ENDOPRO ILIAC 23X12X10 FA (Endovascular Graft) ×2 IMPLANT
ENDOPROSTHESIS EX VBN 8X79X135 (Endovascular Graft) IMPLANT
ENDOPROSTHESIS ILI 23X12X10 FA (Endovascular Graft) IMPLANT
ENDOPROTH EXP VIABAHN 8X79X135 (Endovascular Graft) ×2 IMPLANT
EXCLDR TRNK ENDO 23X14.5X12 15 (Endovascular Graft) ×2 IMPLANT
EXCLUDER TNK END 23X14.5X12 15 (Endovascular Graft) IMPLANT
GAUZE 4X4 16PLY ~~LOC~~+RFID DBL (SPONGE) ×2 IMPLANT
GAUZE SPONGE 2X2 8PLY STRL LF (GAUZE/BANDAGES/DRESSINGS) ×4 IMPLANT
GLIDEWIRE ADV .035X180CM (WIRE) ×2 IMPLANT
GLOVE BIO SURGEON STRL SZ7.5 (GLOVE) ×2 IMPLANT
GOWN STRL REUS W/ TWL LRG LVL3 (GOWN DISPOSABLE) ×4 IMPLANT
GOWN STRL REUS W/ TWL XL LVL3 (GOWN DISPOSABLE) ×4 IMPLANT
GOWN STRL REUS W/TWL LRG LVL3 (GOWN DISPOSABLE) ×4
GOWN STRL REUS W/TWL XL LVL3 (GOWN DISPOSABLE) ×4
GRAFT BALLN CATH 65CM (BALLOONS) ×2 IMPLANT
GUIDEWIRE ANGLED .035X260CM (WIRE) IMPLANT
KIT BASIN OR (CUSTOM PROCEDURE TRAY) ×2 IMPLANT
KIT DRAIN CSF ACCUDRAIN (MISCELLANEOUS) IMPLANT
KIT ENCORE 26 ADVANTAGE (KITS) IMPLANT
KIT TURNOVER KIT B (KITS) ×2 IMPLANT
LEG CONTRALATERAL 16X14.5X12 (Vascular Products) IMPLANT
LEG CONTRALATERAL 27X12 (Vascular Products) IMPLANT
NS IRRIG 1000ML POUR BTL (IV SOLUTION) ×4 IMPLANT
PACK ENDOVASCULAR (PACKS) ×2 IMPLANT
PAD ARMBOARD 7.5X6 YLW CONV (MISCELLANEOUS) ×4 IMPLANT
PENCIL BUTTON HOLSTER BLD 10FT (ELECTRODE) IMPLANT
PERCLOSE PROGLIDE 6F (VASCULAR PRODUCTS) ×8
SET MICROPUNCTURE 5F STIFF (MISCELLANEOUS) ×2 IMPLANT
SHEATH BRITE TIP 8FR 23CM (SHEATH) ×2 IMPLANT
SHEATH DRYSEAL FLEX 12FR 45CM (SHEATH) IMPLANT
SHEATH DRYSEAL FLEX 16FR 33CM (SHEATH) IMPLANT
SHEATH PINNACLE 8F 10CM (SHEATH) ×2 IMPLANT
STOPCOCK MORSE 400PSI 3WAY (MISCELLANEOUS) ×2 IMPLANT
SUT MNCRL AB 4-0 PS2 18 (SUTURE) ×4 IMPLANT
SYR 20ML LL LF (SYRINGE) ×2 IMPLANT
SYR MEDRAD MARK V 150ML (SYRINGE) ×2 IMPLANT
TOWEL GREEN STERILE (TOWEL DISPOSABLE) ×2 IMPLANT
TRAY FOLEY MTR SLVR 16FR STAT (SET/KITS/TRAYS/PACK) ×2 IMPLANT
TUBING HIGH PRESSURE 120CM (CONNECTOR) ×2 IMPLANT
TUBING INJECTOR 48 (MISCELLANEOUS) ×2 IMPLANT
WATER STERILE IRR 1000ML POUR (IV SOLUTION) ×2 IMPLANT
WIRE BENTSON .035X145CM (WIRE) ×4 IMPLANT
WIRE ROSEN-J .035X260CM (WIRE) IMPLANT
WIRE STARTER BENTSON 035X150 (WIRE) IMPLANT

## 2022-07-18 NOTE — Op Note (Signed)
Patient name: Jonathon Stevens MRN: [id number] DOB: 04-13-1945 Sex: male  07/18/2022 Pre-operative Diagnosis: Right common iliac and aortic aneurysms Post-operative diagnosis:  Same Surgeon:  Apolinar Junes C. Randie Heinz, MD Assistant: Kerin Salen, MD Procedure Performed: 1.  Bilateral common femoral percutaneous access and closure with ProGlide devices 2.  Catheter in aorta and aortogram 3.  Repair of right common iliac artery aneurysm with Gore iliac branch 23 x 12 x 10 cm device with right hypogastric artery stent 8 x 79 mm VBX with 14 x 2 cm PTA of proximal VBX 4.  Repair of aortic aneurysm with main body left 23 x 14 x 12 cm device extended with 14 x 12 cm limb and bridge piece to right iliac branch device 27 x 12 cm ballooned with MOB balloon   Indications: 77 year old male with right common iliac artery aneurysm and concomitant aortic aneurysm now meeting requirement for repair.  We discussed the risk benefits and alternatives including continued watchful waiting versus open aneurysm repair versus coiling and extending the right hypogastric artery versus the actual planned procedure for which he presents today.  Findings: The iliac branch device was first placed with VBX extending into the right hypogastric artery and the aortic device was then placed at the level of the lowest left renal artery all devices were ironed out with M OB balloon at completion there was a late type II endoleak from an IMA no type I endoleak's were observed and both hypogastric arteries were patent.   Procedure:  The patient was identified in the holding area and taken to the operating room where he was placed upon operative when general anesthesia was induced.  He was sterilely prepped draped in the bilateral groins and the abdomen in the usual fashion, antibiotics were administered a timeout was called.  We began using ultrasound to first identify the left common femoral artery this was cannulated with  micropuncture needle followed by wire and a sheath into the Bentson wire was placed followed by an 8 [place] dilator and 2 Pro-glide devices were deployed an 8 [place] sheath was placed.  Similarly on the right side ultrasound was used to identify the right common femoral artery which was also without disease this was cannulated with micropuncture needle followed by wire sheath and then Bentson wire and again 2 Pro-glide devices deployed in an 8 [place] sheath placed.  We then exchanged for an Amplatz wire under fluoroscopic guidance and placed a long 16 French sheath up to the aortic bifurcation under fluoroscopic guidance patient was fully heparinized.  On the left side we then placed a stiff wire and placed a long 12 French sheath to the aortic bifurcation.  Through the right side we then placed a Glidewire and this was snared and pulled through and through and the iliac branch device was then pulled avoiding wire wrap up to the level of the aortic bifurcation.  Aortogram was then performed of the distal aorta which demonstrated the takeoff of the hypogastric and the iliac branch device was opened to the flow divider.  We then went up and over with a 12 French sheath over the snared wire and then cannulated the hypogastric artery using Berenstein catheter and Glidewire and then confirmed intraluminal access and exchanged for a Rosen wire.  A VBX that was 8 x 79 mm was then brought up into the hypogastric artery and deployed.  This was then ironed out proximally with a 14 x 2 mm balloon.  The iliac branch was  then fully deployed from the right side and ironed out with M OB balloon and proximally we inflated both the MOD and the 14 mm balloon simultaneously.  After this the through and through [facility] was removed.  The Rosen wire was placed into the aorta and then exchanged for an Amplatz wire and a 16 French sheath was placed from the left side to the level of L2 and the main body device which was 23 x 14 x 12 cm  was brought to the level of L2 and correct angulation of the image intensifier was obtained.  From the right side we used Omni catheter to the level of L1 performed aortogram identified the lowest left renal artery and then deployed the device.  We did have to recapture it and drop it down approximately 2 mm and redeployed it.  We then cannulated the gate from the left side using Glidewire and Kumpe catheter and were able to spend an Omni catheter within the device.  We then extended between the 2 devices with a 27 x 12 device.  On the left side we then fully deployed the main body device and then perform retrograde angiography identifying the hypogastric artery and then finished out on the left side with a 14 x 12 cm device.  The entire device was ironed out with M OB balloon.  Completion angiogram demonstrated a late type II endoleak likely from the IMA but no type I endoleak's and both hypogastric arteries filled briskly.  Satisfied with this we then exchanged for Bentson wire through both sheaths.  Both sheaths were removed sequentially and the Pro-glide devices were deployed.  Both groins were hemostatic and there were strong signals at both feet dorsalis pedis arteries.  50 mg of protamine was then administered.  We placed 4 Monocryl sutures in both groin incisions and Dermabond was placed at the skin level.  The patient was then awakened from anesthesia having tolerated the procedure without any complication.  All counts were correct at completion.  EBL: 200 cc    Renard Caperton C. Randie Heinz, MD [facility] Office: [phone] Pager: [phone]

## 2022-07-18 NOTE — Anesthesia Procedure Notes (Signed)
Arterial Line Insertion Start/End4/23/2024 6:30 AM, 07/18/2022 6:40 AM Performed by: Atilano Median, DO, Darryl Nestle, CRNA  Patient location: PACU. Preanesthetic checklist: patient identified, IV checked, site marked, risks and benefits discussed, surgical consent, monitors and equipment checked, pre-op evaluation, timeout performed and anesthesia consent Lidocaine 1% used for infiltration Left, radial was placed Catheter size: 20 G Hand hygiene performed  and maximum sterile barriers used   Attempts: 1 Procedure performed without using ultrasound guided technique. Following insertion, Biopatch. Post procedure assessment: normal  Patient tolerated the procedure well with no immediate complications.

## 2022-07-18 NOTE — Anesthesia Postprocedure Evaluation (Signed)
Anesthesia Post Note  Patient: Jonathon Stevens  Procedure(s) Performed: ABDOMINAL AORTIC ENDOVASCULAR STENT GRAFT INSERTION OF ILIAC BRANCH DEVICE (Right)     Patient location during evaluation: PACU Anesthesia Type: General Level of consciousness: awake and alert Pain management: pain level controlled Vital Signs Assessment: post-procedure vital signs reviewed and stable Respiratory status: spontaneous breathing, nonlabored ventilation, respiratory function stable and patient connected to nasal cannula oxygen Cardiovascular status: blood pressure returned to baseline and stable Postop Assessment: no apparent nausea or vomiting Anesthetic complications: no   No notable events documented.  Last Vitals:  Vitals:   07/18/22 1120 07/18/22 1633  BP: 124/73 138/86  Pulse: 92 89  Resp: 17 17  Temp: 36.9 C (!) 36.4 C  SpO2: 100% 98%    Last Pain:  Vitals:   07/18/22 1633  TempSrc: Oral  PainSc:                  Nelle Don Indi [place]

## 2022-07-18 NOTE — Interval H&P Note (Signed)
History and Physical Interval Note:  07/18/2022 7:02 AM  Jonathon Stevens  has presented today for surgery, with the diagnosis of Abdominal aortic aneurysm without rupture; Right common iliac aneurysm.  The various methods of treatment have been discussed with the patient and family. After consideration of risks, benefits and other options for treatment, the patient has consented to  Procedure(s): ABDOMINAL AORTIC ENDOVASCULAR STENT GRAFT (N/A) INSERTION OF ILIAC BRANCH DEVICE (Right) as a surgical intervention.  The patient's history has been reviewed, patient examined, no change in status, stable for surgery.  I have reviewed the patient's chart and labs.  Questions were answered to the patient's satisfaction.     Lemar Livings

## 2022-07-18 NOTE — Inpatient Diabetes Management (Signed)
Inpatient Diabetes Program Recommendations  AACE/ADA: New Consensus Statement on Inpatient Glycemic Control (2015)  Target Ranges:  Prepandial:   less than 140 mg/dL      Peak postprandial:   less than 180 mg/dL (1-2 hours)      Critically ill patients:  140 - 180 mg/dL   Lab Results  Component Value Date   GLUCAP 211 (H) 07/18/2022   HGBA1C 7.9 (H) 07/14/2022    Review of Glycemic Control  Latest Reference Range & Units 07/18/22 05:45 07/18/22 10:11 07/18/22 12:15  Glucose-Capillary 70 - 99 mg/dL 161 (H) 096 (H) 045 (H)  (H): Data is abnormally high  Diabetes history: DM2 Outpatient Diabetes medications: None Current orders for Inpatient glycemic control: CBGs  Inpatient Diabetes Program Recommendations:    If CBGs consistently > 180 mg/dL please consider,  Novolog 0-9 units TID.   Will continue to follow while inpatient.  Thank you, Dulce Sellar, MSN, CDCES Diabetes Coordinator Inpatient Diabetes Program [phone] (team pager from 8a-5p)

## 2022-07-18 NOTE — Transfer of Care (Signed)
Immediate Anesthesia Transfer of Care Note  Patient: Jonathon Stevens  Procedure(s) Performed: ABDOMINAL AORTIC ENDOVASCULAR STENT GRAFT INSERTION OF ILIAC BRANCH DEVICE (Right)  Patient Location: PACU  Anesthesia Type:General  Level of Consciousness: awake and alert   Airway & Oxygen Therapy: Patient Spontanous Breathing  Post-op Assessment: Report given to RN, Post -op Vital signs reviewed and stable, and Patient moving all extremities  Post vital signs: Reviewed and stable  Last Vitals:  Vitals Value Taken Time  BP 145/76 07/18/22 1006  Temp    Pulse 87 07/18/22 1010  Resp 13 07/18/22 1010  SpO2 95 % 07/18/22 1010  Vitals shown include unvalidated device data.  Last Pain:  Vitals:   07/18/22 0615  TempSrc:   PainSc: 0-No pain         Complications: No notable events documented.

## 2022-07-18 NOTE — Anesthesia Procedure Notes (Signed)
Procedure Name: Intubation Date/Time: 07/18/2022 7:39 AM  Performed by: Darryl Nestle, CRNAPre-anesthesia Checklist: Patient identified, Emergency Drugs available, Suction available and Patient being monitored Patient Re-evaluated:Patient Re-evaluated prior to induction Oxygen Delivery Method: Circle system utilized Preoxygenation: Pre-oxygenation with 100% oxygen Induction Type: IV induction Ventilation: Mask ventilation without difficulty Laryngoscope Size: Miller and 2 Grade View: Grade I Tube type: Oral Tube size: 7.5 mm Number of attempts: 1 Airway Equipment and Method: Stylet and Oral airway Placement Confirmation: ETT inserted through vocal cords under direct vision, positive ETCO2 and breath sounds checked- equal and bilateral Secured at: 22 cm Tube secured with: Tape Dental Injury: Teeth and Oropharynx as per pre-operative assessment  Comments: Intubated by [facility]

## 2022-07-19 ENCOUNTER — Encounter (HOSPITAL_COMMUNITY): Payer: Self-pay | Admitting: Vascular Surgery

## 2022-07-19 LAB — CBC
HCT: 36.2 % — ABNORMAL LOW (ref 39.0–52.0)
Hemoglobin: 12.8 g/dL — ABNORMAL LOW (ref 13.0–17.0)
MCH: 30.3 pg (ref 26.0–34.0)
MCHC: 35.4 g/dL (ref 30.0–36.0)
MCV: 85.8 fL (ref 80.0–100.0)
Platelets: 137 10*3/uL — ABNORMAL LOW (ref 150–400)
RBC: 4.22 MIL/uL (ref 4.22–5.81)
RDW: 13.5 % (ref 11.5–15.5)
WBC: 10 10*3/uL (ref 4.0–10.5)
nRBC: 0 % (ref 0.0–0.2)

## 2022-07-19 LAB — BASIC METABOLIC PANEL
Anion gap: 10 (ref 5–15)
BUN: 16 mg/dL (ref 8–23)
CO2: 21 mmol/L — ABNORMAL LOW (ref 22–32)
Calcium: 8.8 mg/dL — ABNORMAL LOW (ref 8.9–10.3)
Chloride: 103 mmol/L (ref 98–111)
Creatinine, Ser: 1.4 mg/dL — ABNORMAL HIGH (ref 0.61–1.24)
GFR, Estimated: 52 mL/min — ABNORMAL LOW (ref >60)
Glucose, Bld: 254 mg/dL — ABNORMAL HIGH (ref 70–99)
Potassium: 3.9 mmol/L (ref 3.5–5.1)
Sodium: 134 mmol/L — ABNORMAL LOW (ref 135–145)

## 2022-07-19 MED ORDER — OXYCODONE-ACETAMINOPHEN 5-325 MG PO TABS: 1.0000 | ORAL_TABLET | Freq: Four times a day (QID) | ORAL | 0 refills | Status: DC | PRN

## 2022-07-19 NOTE — Discharge Summary (Signed)
EVAR Discharge Summary   Jonathon Stevens [birth year] 77 y.o. male  MRN: [id number]  Admission Date: 07/18/2022  Discharge Date: 07/19/2022   Physician: Juventino Slovak*  Admission Diagnosis: S/P AAA repair [W09.811, Z86.79]   HPI:   This is a 77 y.o. male with  history of abdominal aortic aneurysm and right common iliac artery aneurysm noted to be increased in size on recent ultrasound.  He now follows up with CT scan.  He denies any new back or abdominal pain he is planned for cardiac ablation in June of this year and remains on anticoagulation with Xarelto for atrial flutter.  Denies any chest pain at this time.  Remains at his usual level of activity although has been somewhat not as active lately that [facility].   Hospital Course:  The patient was admitted to the hospital and taken to the operating room on 07/18/2022 and underwent: 1.  Bilateral common femoral percutaneous access and closure with ProGlide devices 2.  Catheter in aorta and aortogram 3.  Repair of right common iliac artery aneurysm with Gore iliac branch 23 x 12 x 10 cm device with right hypogastric artery stent 8 x 79 mm VBX with 14 x 2 cm PTA of proximal VBX 4.  Repair of aortic aneurysm with main body left 23 x 14 x 12 cm device extended with 14 x 12 cm limb and bridge piece to right iliac branch device 27 x 12 cm ballooned with MOB balloon    Findings: The iliac branch device was first placed with VBX extending into the right hypogastric artery and the aortic device was then placed at the level of the lowest left renal artery all devices were ironed out with M OB balloon at completion there was a late type II endoleak from an IMA no type I endoleak's were observed and both hypogastric arteries were patent.   The pt tolerated the procedure well and was transported to the PACU in good condition.   By POD 1, pt was doing well.  He had palpable pedal pulses.  Bilateral groins soft without hematoma.  He was  able to walk, void and take in solids without difficulty.  Pt doing well and was discharged home.    CBC    Component Value Date/Time   WBC 10.0 07/19/2022 0140   RBC 4.22 07/19/2022 0140   HGB 12.8 (L) 07/19/2022 0140   HGB 15.7 05/16/2021 0915   HCT 36.2 (L) 07/19/2022 0140   HCT 46.4 05/16/2021 0915   PLT 137 (L) 07/19/2022 0140   PLT 201 05/16/2021 0915   MCV 85.8 07/19/2022 0140   MCV 84 05/16/2021 0915   MCH 30.3 07/19/2022 0140   MCHC 35.4 07/19/2022 0140   RDW 13.5 07/19/2022 0140   RDW 14.0 05/16/2021 0915   LYMPHSABS 2.0 02/13/2021 2033   MONOABS 0.7 02/13/2021 2033   EOSABS 0.1 02/13/2021 2033   BASOSABS 0.0 02/13/2021 2033    BMET    Component Value Date/Time   NA 134 (L) 07/19/2022 0140   NA 140 08/17/2021 1000   K 3.9 07/19/2022 0140   CL 103 07/19/2022 0140   CO2 21 (L) 07/19/2022 0140   GLUCOSE 254 (H) 07/19/2022 0140   BUN 16 07/19/2022 0140   BUN 14 08/17/2021 1000   CREATININE 1.40 (H) 07/19/2022 0140   CALCIUM 8.8 (L) 07/19/2022 0140   GFRNONAA 52 (L) 07/19/2022 0140   GFRAA >60 04/10/2019 0311       Discharge Instructions  Discharge patient   Complete by: As directed    Discharge pt once he has walked in hallway, voided and been seen by Dr. Randie Heinz.   Discharge disposition: 01-Home or Self Care   Discharge patient date: 07/19/2022       Discharge Diagnosis:  S/P AAA repair [W09.811, Z86.79]  Secondary Diagnosis: Patient Active Problem List   Diagnosis Date Noted   S/P AAA repair 07/18/2022   Hypercoagulable state due to paroxysmal atrial fibrillation 10/11/2021   Persistent atrial fibrillation 10/11/2021   Tachycardia 02/13/2021   Anemia, blood loss 02/13/2021   Hematemesis 02/13/2021   Hematochezia 02/13/2021   Acute upper GI bleed    Aortic aneurysm without rupture 04/06/2020   Diarrhea 03/11/2020   Gastro-esophageal reflux disease without esophagitis 03/11/2020   Lumbar compression fracture 06/16/2019   Atrial  fibrillation with RVR 03/30/2019   COVID-19 virus infection 03/30/2019   Colon cancer screening 11/16/2017   Nontraumatic epidural hematoma 11/16/2017   Sciatica of right side 10/12/2017   Chronic obstructive pulmonary disease with acute exacerbation 08/27/2017   History of cardioembolic stroke 05/16/2017   Anticoagulated 07/13/2016   Erectile dysfunction 06/22/2015   Impaired fasting glucose 06/22/2015   Primary gout 06/22/2015   Paroxysmal atrial fibrillation 11/05/2014   Abnormal electrocardiogram 02/02/2014   Stroke 11/19/2013   HLD (hyperlipidemia) 10/27/2013   Prediabetes 10/10/2013   Atrial flutter    Cerebral embolism with cerebral infarction 10/07/2013   Past Medical History:  Diagnosis Date   Atrial fibrillation    Atrial flutter    COVID 03/30/2019   CVA (cerebral infarction)    embolic CVA 09/2013   Diabetes mellitus without complication    Dysrhythmia    Gout    HLD (hyperlipidemia)    Hx of echocardiogram    a. ECHO 10/08/13: EF 45-50%, mild hypokinesis of apical myocardium. Mild LA dilation, mild RA dilation;  b.  TEE (10/09/13):  EF 50-55%, normal wall motion, no LAA clot   Hypertension    Pneumonia    Stroke      Allergies as of 07/19/2022       Reactions   Flexeril [cyclobenzaprine] Nausea Only   Indomethacin Rash        Medication List     TAKE these medications    allopurinol 300 MG tablet Commonly known as: ZYLOPRIM Take 150 mg by mouth daily after breakfast.   atorvastatin 10 MG tablet Commonly known as: LIPITOR Take 1 tablet (10 mg total) by mouth daily.   blood glucose meter kit and supplies Kit Dispense based on patient and insurance preference. Use up to four times daily as directed. (FOR ICD-9 250.00, 250.01).   CALCIUM PO Take 1 tablet by mouth daily.   CoQ-10 100 MG Caps Take 100 mg by mouth daily.   dofetilide 250 MCG capsule Commonly known as: TIKOSYN Take 1 capsule (250 mcg total) by mouth 2 (two) times daily.    GLUCOSAMINE-CHONDROITIN PO Take 1 tablet by mouth daily.   Magnesium 250 MG Tabs Take 250 mg by mouth daily.   multivitamin tablet Take 1 tablet by mouth daily.   oxyCODONE-acetaminophen 5-325 MG tablet Commonly known as: Percocet Take 1 tablet by mouth every 6 (six) hours as needed.   pantoprazole 40 MG tablet Commonly known as: PROTONIX Take 1 tablet (40 mg total) by mouth daily.   Vitamin D3 125 MCG (5000 UT) Caps Take 5,000 Units by mouth daily.   Xarelto 20 MG Tabs tablet Generic drug: rivaroxaban TAKE 1 TABLET(20  MG) BY MOUTH DAILY        Discharge Instructions:  [facility]  Discharge Instructions Endovascular Aortic Aneurysm Repair  Please refer to the following instructions for your post-procedure care. Your surgeon or Physician Assistant will discuss any changes with you.  Activity  You are encouraged to walk as much as you can. You can slowly return to normal activities but must avoid strenuous activity and heavy lifting until your doctor tells you it's OK. Avoid activities such as vacuuming or swinging a gold club. It is normal to feel tired for several weeks after your surgery. Do not drive until your doctor gives the OK and you are no longer taking prescription pain medications. It is also normal to have difficulty with sleep habits, eating, and bowel movements after surgery. These will go away with time.  Bathing/Showering  You may shower after you go home. If you have an incision, do not soak in a bathtub, hot tub, or swim until the incision heals completely.  Incision Care  Shower every day. Clean your incision with mild soap and water. Pat the area dry with a clean towel. You do not need a bandage unless otherwise instructed. Do not apply any ointments or creams to your incision. If you clothing is irritating, you may cover your incision with a dry gauze pad.  Diet  Resume your normal diet. There are no special food  restrictions following this procedure. A low fat/low cholesterol diet is recommended for all patients with vascular disease. In order to heal from your surgery, it is CRITICAL to get adequate nutrition. Your body requires vitamins, minerals, and protein. Vegetables are the best source of vitamins and minerals. Vegetables also provide the perfect balance of protein. Processed food has little nutritional value, so try to avoid this.  Medications  Resume taking all of your medications unless your doctor or Physician Assistnat tells you not to. If your incision is causing pain, you may take over-the-counter pain relievers such as acetaminophen (Tylenol). If you were prescribed a stronger pain medication, please be aware these medications can cause nausea and constipation. Prevent nausea by taking the medication with a snack or meal. Avoid constipation by drinking plenty of fluids and eating foods with a high amount of fiber, such as fruits, vegetables, and grains.  Do not take Tylenol if you are taking prescription pain medications.   Follow up  Our office will schedule a follow-up appointment with a C.T. scan 3-4 weeks after your surgery.  Please call us immediately for any of the following conditions  Severe or worsening pain in your legs or feet or in your abdomen back or chest. Increased pain, redness, drainage (pus) from your incision sit. Increased abdominal pain, bloating, nausea, vomiting or persistent diarrhea. Fever of 101 degrees or higher. Swelling in your leg (s),  Reduce your risk of vascular disease  Stop smoking. If you would like help call [facility] at 1-800-QUIT-NOW ((843)395-4384) or [facility] at (954)070-8744. Manage your cholesterol Maintain a desired weight Control your diabetes Keep your blood pressure down  If you have questions, please call the office at [phone].   Prescriptions given: 1.  Roxicet #8 No Refill  Disposition: home  Patient's condition:  is Good  Follow up: 1. Dr. Randie Heinz in 4 weeks with CTA protocol 2. Cardiology in one week to check lab work   Doreatha Massed, [place] [facility] [phone] 07/19/2022  7:44 AM   - For VQI Registry use -  Post-op:  Time to Extubation:  In OR,  < 12 hrs,  12-24 hrs,  >=24 hrs Vasopressors Req. Post-op: No MI: No.,  Troponin only,  EKG or Clinical New Arrhythmia: No CHF: No ICU Stay: 1 day in progressive Transfusion: No     If yes, n/a units given  Complications: Resp failure: No.,  Pneumonia,  Ventilator Chg in renal function: No.,  Inc. Cr > 0.5,  Temp. Dialysis,   Permanent dialysis Leg ischemia: No., no Surgery needed,  Yes, Surgery needed,   Amputation Bowel ischemia: No.,  Medical Rx,  Surgical Rx Wound complication: No.,  Superficial separation/infection,  Return to OR Return to OR: No  Return to OR for bleeding: No Stroke: No.,  Minor,  Major  Discharge medications: Statin use:  Yes  ASA use:  No  Plavix use:  No  Beta blocker use:  No  ARB use:  No ACEI use:  No CCB use:  No Xarelto:  yes

## 2022-07-19 NOTE — TOC Transition Note (Signed)
Transition of Care (TOC) - CM/SW Discharge Note Donn Pierini RN, BSN Transitions of Care Unit 4E- RN Case Manager See Treatment Team for direct phone #   Patient Details  Name: Jonathon Stevens MRN: [id number] Date of Birth: Jun 03, 1945  Transition of Care [facility]) CM/SW Contact:  Darrold Span, RN Phone Number: 07/19/2022, 1:24 PM   Clinical Narrative:    Pt stable for transition home today, CM Notified by [facility] liaison Rhonda [phone]) -following patient with MD office protocol referral prearranged for [facility] needs  [facility] liaison notified of discharge for start of care scheduling.   No further TOC needs noted.    Final next level of care: Home w Home Health Services Barriers to Discharge: No Barriers Identified   Patient Goals and CMS Choice    Office protocol referral  Discharge Placement           Home w/ [facility]             Discharge Plan and Services Additional resources added to the After Visit Summary for       Post Acute Care Choice: Home Health          DME Arranged: N/A DME Agency: NA       HH Arranged: RN, PT HH Agency: [facility] Date [facility] Agency Contacted: 07/19/22 Time HH Agency Contacted: 0900 Representative spoke with at [facility] Agency: Bjorn Loser  Social Determinants of Health (SDOH) Interventions SDOH Screenings   Tobacco Use: Medium Risk (07/18/2022)     Readmission Risk Interventions     No data to display

## 2022-07-19 NOTE — Discharge Instructions (Signed)
  [facility] [place]   Discharge Instructions  Endovascular Aortic Aneurysm Repair  Please refer to the following instructions for your post-procedure care. Your surgeon or Physician Assistant will discuss any changes with you.  Activity  You are encouraged to walk as much as you can. You can slowly return to normal activities but must avoid strenuous activity and heavy lifting until your doctor tells you it's OK. Avoid activities such as vacuuming or swinging a gold club. It is normal to feel tired for several weeks after your surgery. Do not drive until your doctor gives the OK and you are no longer taking prescription pain medications. It is also normal to have difficulty with sleep habits, eating, and bowel movements after surgery. These will go away with time.  Bathing/Showering  Shower daily after you go home.  Do not soak in a bathtub, hot tub, or swim until the incision heals completely.  If you have incisions in your groin, wash the groin wounds with soap and water daily and pat dry. (No tub bath-only shower)  Then put a dry gauze or washcloth there to keep this area dry to help prevent wound infection daily and as needed.  Do not use Vaseline or neosporin on your incisions.  Only use soap and water on your incisions and then protect and keep dry.  Incision Care  Shower every day. Clean your incision with mild soap and water. Pat the area dry with a clean towel. You do not need a bandage unless otherwise instructed. Do not apply any ointments or creams to your incision. If you clothing is irritating, you may cover your incision with a dry gauze pad.  Diet  Resume your normal diet. There are no special food restrictions following this procedure. A low fat/low cholesterol diet is recommended for all patients with vascular disease. In order to heal from your surgery, it is CRITICAL to get adequate nutrition. Your body requires vitamins, minerals, and protein.  Vegetables are the best source of vitamins and minerals. Vegetables also provide the perfect balance of protein. Processed food has little nutritional value, so try to avoid this.  Medications  Resume taking all of your medications unless your doctor or nurse practitioner tells you not to. If your incision is causing pain, you may take over-the-counter pain relievers such as acetaminophen (Tylenol). If you were prescribed a stronger pain medication, please be aware these medications can cause nausea and constipation. Prevent nausea by taking the medication with a snack or meal. Avoid constipation by drinking plenty of fluids and eating foods with a high amount of fiber, such as fruits, vegetables, and grains.  Do not take Tylenol if you are taking prescription pain medications.   Follow up  Our office will schedule a follow-up appointment with a CT scan 3-4 weeks after your surgery.  Please call us immediately for any of the following conditions  Severe or worsening pain in your legs or feet or in your abdomen back or chest. Increased pain, redness, drainage (pus) from your incision site. Increased abdominal pain, bloating, nausea, vomiting or persistent diarrhea. Fever of 101 degrees or higher. Swelling in your leg (s),  Reduce your risk of vascular disease  Stop smoking. If you would like help call [facility] at 1-800-QUIT-NOW (1-800-784-8669) or [place] at [phone]. Manage your cholesterol Maintain a desired weight Control your diabetes Keep your blood pressure down  If you have questions, please call the office at [phone].  

## 2022-07-19 NOTE — Progress Notes (Addendum)
  Progress Note    07/19/2022 6:49 AM 1 Day Post-Op  Subjective:  says he really hasn't had any pain.  Has walked in the room.  Says the catheter was the only thing that really bothered him as it burned overnight.  It was removed this morning. He has voided a small amount.   Afebrile HR 80's-100 NSR 130's systolic 99% RA  Vitals:   07/18/22 2020 07/19/22 0020  BP: 133/68 132/77  Pulse: 88 84  Resp: 15 15  Temp: 97.6 F (36.4 C) 97.9 F (36.6 C)  SpO2: 98% 99%    Physical Exam: General:  no distress Cardiac:  regular Lungs:  non labored Incisions:  bilateral groins are soft Extremities:  palpable DP pulses bilaterally Abdomen:  soft, NT  CBC    Component Value Date/Time   WBC 10.0 07/19/2022 0140   RBC 4.22 07/19/2022 0140   HGB 12.8 (L) 07/19/2022 0140   HGB 15.7 05/16/2021 0915   HCT 36.2 (L) 07/19/2022 0140   HCT 46.4 05/16/2021 0915   PLT 137 (L) 07/19/2022 0140   PLT 201 05/16/2021 0915   MCV 85.8 07/19/2022 0140   MCV 84 05/16/2021 0915   MCH 30.3 07/19/2022 0140   MCHC 35.4 07/19/2022 0140   RDW 13.5 07/19/2022 0140   RDW 14.0 05/16/2021 0915   LYMPHSABS 2.0 02/13/2021 2033   MONOABS 0.7 02/13/2021 2033   EOSABS 0.1 02/13/2021 2033   BASOSABS 0.0 02/13/2021 2033    BMET    Component Value Date/Time   NA 134 (L) 07/19/2022 0140   NA 140 08/17/2021 1000   K 3.9 07/19/2022 0140   CL 103 07/19/2022 0140   CO2 21 (L) 07/19/2022 0140   GLUCOSE 254 (H) 07/19/2022 0140   BUN 16 07/19/2022 0140   BUN 14 08/17/2021 1000   CREATININE 1.40 (H) 07/19/2022 0140   CALCIUM 8.8 (L) 07/19/2022 0140   GFRNONAA 52 (L) 07/19/2022 0140   GFRAA >60 04/10/2019 0311    INR    Component Value Date/Time   INR 1.2 07/18/2022 1157     Intake/Output Summary (Last 24 hours) at 07/19/2022 1610 Last data filed at 07/19/2022 0620 Gross per 24 hour  Intake 2800 ml  Output 2300 ml  Net 500 ml      Assessment/Plan:  77 y.o. male is s/p:  EVAR  1 Day  Post-Op   -pt doing well this morning.   -foley removed this morning-pt states he has voided a small amount -acute surgical blood loss anemia-tolerating -creatinine 1.4 up from 1.35 with good UOP-appears at baseline from previous labs -discharge home today after he has walked in the hallway, voided and been seen by Dr. Randie Heinz -will restart Xarelto today.  Pt is not on aspirin.  Continue statin   Doreatha Massed, PA-C [facility] [phone] 07/19/2022 6:49 AM  I have independently interviewed patient via telephone and agree with PA assessment and plan above.   Yesenia Locurto C. Randie Heinz, MD [facility] [place] Office: [phone] Pager: [phone]

## 2022-08-01 ENCOUNTER — Telehealth: Payer: Self-pay

## 2022-08-01 ENCOUNTER — Telehealth: Payer: Self-pay | Admitting: Interventional Cardiology

## 2022-08-01 ENCOUNTER — Other Ambulatory Visit: Payer: Self-pay

## 2022-08-01 DIAGNOSIS — I714 Abdominal aortic aneurysm, without rupture, unspecified: Secondary | ICD-10-CM

## 2022-08-01 NOTE — Telephone Encounter (Signed)
Addressed in secure chat.  Patient to see AFib clinic

## 2022-08-01 NOTE — Telephone Encounter (Signed)
Urgent call received in [facility] Triage from front desk.    LPN Vikki Ports with Pt at home, called Triage to report and advised me Pt HR currently between 135 - 143 bpm. For approximately 10 minutes at time of call.    Pt has history of    PAF, Afib with RVR, Atrial flutter.  Pt took morning Tikosyn 250 mcg and takes Tikosyn BID.  Pt felt palpitations, but had no other cardiac symptoms at time of call. Pt also stated that his HR has been up and down since this morning.  Per Pt, this is the fastest his HR has been 143 bpm.     Pt is scheduled for an Atrial Fibrillation Ablation on 09/06/2022 with Dr. Nelly Laurence.   I assessed the [facility] scheduled for today, and Dr. Eldridge Dace not in [facility] clinic today.  Nothing was available at [facility] this afternoon, and all APP schedules were found to be full at time of this telephone call.    Since Pt HR between 135 - 143 Bpm, and no other scheduling options available, Pt and Valerie LPN advised to take Pt to nearest ER for providers care.  Both [place] ER option, and [facility] ER option was presented.   Pt refused this option stating: "I am not going to the ER."  I attempted to help him understand his current situation, possible medication interventions, and his need for care;  I also apologized for no appointment availability at [facility], but Pt still refused to go to nearest ER.      Vikki Ports LPN asked for my name, and I shared my credentials, and advised her that the best plan of care is the ER for a providers assessment.  I shared this at 147 pm on 5/7.  I will forward a message to Dr. Eldridge Dace and his RN, regarding this encounter.

## 2022-08-01 NOTE — Telephone Encounter (Signed)
AFIB Clinic contacted per Urgent call received in [facility] Triage.    Per Stacy RN AFIB Clinic, they had an 830 am appointment available on 08/02/2022.  Pt called and appreciated the follow up, and stated he would be open to see Mr. Fenton PA-C, and AFIB team tomorrow.    Pt also stated his HR was now between 65-73 bpm, but has been up and down all day.  Pt stated his AFIB has worsened since having Covid; Pt stated prior to [facility] LPN leaving, she checked his BP and it was 117/76.    Message sent to Ms. Stacy RN at AFIB clinic confirming the 5/8 830 am appointment time.

## 2022-08-01 NOTE — Telephone Encounter (Signed)
M[place], [facility] nurse called with pt present to let us know that pt's HR is running in 130's-140s. She thought we were his cardiology office. I have given her Dr. Hoyle Barr number, as pt sees him.

## 2022-08-01 NOTE — Telephone Encounter (Signed)
I suspect he is having episodes of AFib.  Does not look like he is on any rate control.  Only on Dofetilide for rhythm control.  If ok with Dr. Nelly Laurence, would add metoprolol tartrate 25 mg PO BID prn palpitations.

## 2022-08-01 NOTE — Telephone Encounter (Signed)
STAT if HR is under 50 or over 120 (normal HR is 60-100 beats per minute)  What is your heart rate? 143  Do you have a log of your heart rate readings (document readings)? Between 135-143  Do you have any other symptoms? No    Home health nurse is calling to reports elevated vital signs.

## 2022-08-02 ENCOUNTER — Other Ambulatory Visit (HOSPITAL_COMMUNITY): Payer: Self-pay | Admitting: Internal Medicine

## 2022-08-02 ENCOUNTER — Ambulatory Visit (HOSPITAL_COMMUNITY)
Admission: RE | Admit: 2022-08-02 | Discharge: 2022-08-02 | Disposition: A | Discharge status: Home / Self Care | Payer: Medicare Other | Source: Ambulatory Visit | Attending: Internal Medicine | Admitting: Internal Medicine

## 2022-08-02 VITALS — BP 142/80 | HR 101 | Ht 70.0 in | Wt 186.6 lb

## 2022-08-02 DIAGNOSIS — E785 Hyperlipidemia, unspecified: Secondary | ICD-10-CM | POA: Insufficient documentation

## 2022-08-02 DIAGNOSIS — Z79899 Other long term (current) drug therapy: Secondary | ICD-10-CM | POA: Insufficient documentation

## 2022-08-02 DIAGNOSIS — Z95828 Presence of other vascular implants and grafts: Secondary | ICD-10-CM | POA: Insufficient documentation

## 2022-08-02 DIAGNOSIS — D6869 Other thrombophilia: Secondary | ICD-10-CM | POA: Insufficient documentation

## 2022-08-02 DIAGNOSIS — I10 Essential (primary) hypertension: Secondary | ICD-10-CM | POA: Diagnosis not present

## 2022-08-02 DIAGNOSIS — Z8719 Personal history of other diseases of the digestive system: Secondary | ICD-10-CM | POA: Diagnosis not present

## 2022-08-02 DIAGNOSIS — Z8673 Personal history of transient ischemic attack (TIA), and cerebral infarction without residual deficits: Secondary | ICD-10-CM | POA: Insufficient documentation

## 2022-08-02 DIAGNOSIS — I4819 Other persistent atrial fibrillation: Secondary | ICD-10-CM | POA: Diagnosis not present

## 2022-08-02 DIAGNOSIS — I4719 Other supraventricular tachycardia: Secondary | ICD-10-CM | POA: Diagnosis not present

## 2022-08-02 DIAGNOSIS — I4892 Unspecified atrial flutter: Secondary | ICD-10-CM | POA: Insufficient documentation

## 2022-08-02 DIAGNOSIS — I2510 Atherosclerotic heart disease of native coronary artery without angina pectoris: Secondary | ICD-10-CM | POA: Insufficient documentation

## 2022-08-02 DIAGNOSIS — Z87891 Personal history of nicotine dependence: Secondary | ICD-10-CM | POA: Insufficient documentation

## 2022-08-02 DIAGNOSIS — Z7901 Long term (current) use of anticoagulants: Secondary | ICD-10-CM | POA: Diagnosis not present

## 2022-08-02 MED ORDER — METOPROLOL TARTRATE 25 MG PO TABS: 25.0000 mg | ORAL_TABLET | Freq: Two times a day (BID) | ORAL | 1 refills | Status: DC | PRN

## 2022-08-02 NOTE — Patient Instructions (Signed)
Metoprolol 25mg  - use as needed twice daily for breakthrough afib

## 2022-08-02 NOTE — Progress Notes (Signed)
Primary Care Physician: Barbie Banner, MD Primary Cardiologist: Dr Eldridge Dace  Primary Electrophysiologist: Dr Ladona Ridgel Referring Physician: Dr Ladona Ridgel   Jonathon Stevens is a 77 y.o. male with a history of CVA, AAA, right common iliac aneurysm, HLD, pulmonary fibrosis, COPD, CKD, esophageal bleed s/p clipping, atrial flutter, atrial fibrillation who presents for follow up in the [facility]. Patient is on Xarelto for a CHADS2VASC score of 6. He wore a cardiac monitor which showed 100% afib burden but also did have VT (22 beats) and pauses up to 10.5 seconds. He was seen by Dr Ladona Ridgel 09/23/21 who recommended dofetilide.   Patient is s/p dofetilide loading 7/18-7/21/23 with DCCV on 10/13/21. He was in rate controlled atrial flutter on follow up but was symptomatically improved. Patient has continued to go in and out of rhythm since starting dofetilide. He has been set up for DCCV twice but converted to SR prior to each.   On follow up 04/27/22, patient reports that over the past 3-4 weeks his afib burden has increased. His smart watch shows a 61% burden. He has been much more fatigued. He has been dealing with some family stressors lately but no other specific triggers for his afib.   On follow up 08/02/22, he is currently in atrial flutter? Seen by Dr. Nelly Laurence on 3/11 and is scheduled for Afib ablation on 09/06/22. Patient called yesterday noting his HR was 143. He feels tired and short of breath with walking when in Afib. No missed doses of anticoagulation.   Today, he denies symptoms of palpitations, chest pain, orthopnea, PND, lower extremity edema, dizziness, presyncope, syncope, snoring, daytime somnolence, bleeding, or neurologic sequela. The patient is tolerating medications without difficulties and is otherwise without complaint today.    Atrial Fibrillation Risk Factors:  he does not have symptoms or diagnosis of sleep apnea. he does not have a history of rheumatic  fever.   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 ECHO 10/08/13: EF 45-50%, mild hypokinesis of apical myocardium. Mild LA dilation, mild RA dilation;  b.  TEE (10/09/13):  EF 50-55%, normal wall motion, no LAA clot   Hypertension    Pneumonia    Stroke [facility])    Past Surgical History:  Procedure Laterality Date   ABDOMINAL AORTIC ENDOVASCULAR STENT GRAFT N/A 07/18/2022   Procedure: ABDOMINAL AORTIC ENDOVASCULAR STENT GRAFT;  Surgeon: Maeola Harman, MD;  Location: [facility] OR;  Service: Vascular;  Laterality: N/A;   CARDIOVERSION N/A 10/13/2021   Procedure: CARDIOVERSION;  Surgeon: Thomasene Ripple, DO;  Location: [facility] ENDOSCOPY;  Service: Cardiovascular;  Laterality: N/A;   CATARACT EXTRACTION Bilateral    ESOPHAGOGASTRODUODENOSCOPY (EGD) WITH PROPOFOL N/A 02/14/2021  Procedure: ESOPHAGOGASTRODUODENOSCOPY (EGD) WITH PROPOFOL;  Surgeon: Jeani Hawking, MD;  Location: [facility] ENDOSCOPY;  Service: Endoscopy;  Laterality: N/A;   HEMOSTASIS CLIP PLACEMENT  02/14/2021   Procedure: HEMOSTASIS CLIP PLACEMENT;  Surgeon: Jeani Hawking, MD;  Location: [facility] ENDOSCOPY;  Service: Endoscopy;;   INSERTION OF ILIAC STENT Right 07/18/2022   Procedure: INSERTION OF ILIAC BRANCH DEVICE;  Surgeon: Maeola Harman, MD;   Location: [facility] OR;  Service: Vascular;  Laterality: Right;   TEE WITHOUT CARDIOVERSION N/A 10/09/2013   Procedure: TRANSESOPHAGEAL ECHOCARDIOGRAM (TEE);  Surgeon: Chrystie Nose, MD;  Location: [facility] ENDOSCOPY;  Service: Cardiovascular;  Laterality: N/A;    Current Outpatient Medications  Medication Sig Dispense Refill   allopurinol (ZYLOPRIM) 300 MG tablet Take 150 mg by mouth daily after breakfast.     atorvastatin (LIPITOR) 10 MG tablet Take 1 tablet (10 mg total) by mouth daily. 90 tablet 3   blood glucose meter kit and supplies KIT Dispense based on patient and insurance preference. Use up to four times daily as directed. (FOR ICD-9 250.00, 250.01). 1 each 0   CALCIUM PO Take 1 tablet by mouth daily.     Cholecalciferol (VITAMIN D3) 125 MCG (5000 UT) CAPS Take 5,000 Units by mouth daily.     Coenzyme Q10 (COQ-10) 100 MG CAPS Take 100 mg by mouth daily.     dofetilide (TIKOSYN) 250 MCG capsule Take 1 capsule (250 mcg total) by mouth 2 (two) times daily. 180 capsule 3   GLUCOSAMINE-CHONDROITIN PO Take 1 tablet by mouth daily.      Magnesium 250 MG TABS Take 250 mg by mouth daily.     Multiple Vitamin (MULTIVITAMIN) tablet Take 1 tablet by mouth daily.     oxyCODONE-acetaminophen (PERCOCET) 5-325 MG tablet Take 1 tablet by mouth every 6 (six) hours as needed. 8 tablet 0   pantoprazole (PROTONIX) 40 MG tablet Take 1 tablet (40 mg total) by mouth daily. 30 tablet 1   XARELTO 20 MG TABS tablet TAKE 1 TABLET(20 MG) BY MOUTH DAILY 90 tablet 1   No current facility-administered medications for this encounter.    Allergies  Allergen Reactions   Flexeril [Cyclobenzaprine] Nausea Only   Indomethacin Rash    Social History   Socioeconomic History   Marital status: Married    Spouse name: Olegario Messier   Number of children: 2   Years of education: college   Highest education level: Not on file  Occupational History   Occupation: retired   Tobacco Use   Smoking status: Former    Packs/day: 0.50     Years: 15.00    Additional pack years: 0.00    Total pack years: 7.50    Types: Cigarettes    Quit date: 07/13/2015    Years since quitting: 7.0    Passive exposure: Never   Smokeless tobacco: Never   Tobacco comments:    Former smoker 10/11/21  Vaping Use   Vaping Use: Never used  Substance and Sexual Activity   Alcohol use: Yes    Alcohol/week: 1.0 standard drink of alcohol    Types: 1 Standard drinks or equivalent per week    Comment: 1 beer once a month 04/27/22   Drug use: No   Sexual activity: Yes  Other Topics Concern   Not on file  Social History Narrative   Patient is married lives with Spouse Olegario Messier).   Patient is right handed.   Patient has a Automotive engineer Degree   Patient drinks 1 cup of coffee daily  Social Determinants of Health   Financial Resource Strain: Not on file  Food Insecurity: Not on file  Transportation Needs: Not on file  Physical Activity: Not on file  Stress: Not on file  Social Connections: Not on file  Intimate Partner Violence: Not on file     ROS- All systems are reviewed and negative except as per the HPI above.  Physical Exam: Vitals:   08/02/22 0838  Weight: 84.6 kg  Height: 5\' 10"  (1.778 m)    GEN- The patient is well appearing, alert and oriented x 3 today.   Head- normocephalic, atraumatic Eyes-  Sclera clear, conjunctiva pink Ears- hearing intact Oropharynx- clear Neck- supple, no JVP Lymph- no cervical lymphadenopathy Lungs- Clear to ausculation bilaterally, normal work of breathing Heart- Tachycardic irregular rate and rhythm, no murmurs, rubs or gallops, PMI not laterally displaced GI- soft, NT, ND, + BS Extremities- no clubbing, cyanosis, or edema MS- no significant deformity or atrophy Skin- no rash or lesion Psych- euthymic mood, full affect Neuro- strength and sensation are intact    Wt Readings from Last 3 Encounters:  08/02/22 84.6 kg  07/18/22 86.2 kg  07/14/22 88.4 kg    EKG today demonstrates   Vent. rate 101 BPM PR interval * ms QRS duration 84 ms QT/QTcB 392/508 ms P-R-T axes * 37 51 Accelerated Junctional rhythm Abnormal ECG When compared with ECG of 19-Jul-2022 10:09, PREVIOUS ECG IS PRESENT  Echo 02/13/21 demonstrated   1. Left ventricular ejection fraction, by estimation, is 70 to 75%. The  left ventricle has hyperdynamic function. The left ventricle has no  regional wall motion abnormalities. There is mild left ventricular  hypertrophy. Left ventricular diastolic function could not be evaluated.   2. Right ventricular systolic function is normal. The right ventricular  size is normal.   3. Left atrial size was moderately dilated.   4. The mitral valve is normal in structure. Mild mitral valve  regurgitation. No evidence of mitral stenosis.   5. The aortic valve is tricuspid. Aortic valve regurgitation is not  visualized. Aortic valve sclerosis is present, with no evidence of aortic valve stenosis.   6. The inferior vena cava is normal in size with greater than 50%  respiratory variability, suggesting right atrial pressure of 3 mmHg.   Epic records are reviewed at length today  CHA2DS2-VASc Score = 7  The patient's score is based upon: CHF History: 0 HTN History: 1 Diabetes History: 1 Stroke History: 2 Vascular Disease History: 1 Age Score: 2 Gender Score: 0       ASSESSMENT AND PLAN: 1. Persistent Atrial Fibrillation/atrial flutter The patient's CHA2DS2-VASc score is 7, indicating a 11.2% annual risk of stroke.   S/p dofetilide admission 7/18-7/21/23 Patient having more frequent afib and is scheduled in June for ablation with Dr. Nelly Laurence.  He is in what appears to be atrial flutter today.  He called yesterday noting HR 143.  Continue dofetilide 250 mcg BID. QT stable. Continue Xarelto 20 mg daily Previous history of AV nodal agents with h/o significant bradycardia/pauses.  Apple Watch for home monitoring.  Noted discussion between Dr. Eldridge Dace  and Dr. Nelly Laurence. Will go ahead and give Lopressor 25 mg BID prn palpitations which will hopefully help until his ablation.   2. Secondary Hypercoagulable State (ICD10:  D68.69) The patient is at significant risk for stroke/thromboembolism based upon his CHA2DS2-VASc Score of 7.  Continue Rivaroxaban (Xarelto).  No missed doses.   3. HTN Stable, no changes today.  4. CAD  On statin No anginal symptoms.   Follow up as scheduled for ablation next month.    Lake Bells, PA-C Afib Clinic [facility] [address] [place], [place] [address] [phone] 08/02/2022 8:46 AM

## 2022-08-04 ENCOUNTER — Encounter: Payer: Self-pay | Admitting: Cardiovascular Disease

## 2022-08-13 ENCOUNTER — Ambulatory Visit (HOSPITAL_BASED_OUTPATIENT_CLINIC_OR_DEPARTMENT_OTHER)
Admission: RE | Admit: 2022-08-13 | Discharge: 2022-08-13 | Disposition: A | Discharge status: Home / Self Care | Payer: Medicare Other | Source: Ambulatory Visit | Attending: Vascular Surgery | Admitting: Vascular Surgery

## 2022-08-13 DIAGNOSIS — I714 Abdominal aortic aneurysm, without rupture, unspecified: Secondary | ICD-10-CM | POA: Diagnosis present

## 2022-08-13 LAB — POCT I-STAT CREATININE: Creatinine, Ser: 1.3 mg/dL — ABNORMAL HIGH (ref 0.61–1.24)

## 2022-08-13 MED ORDER — IOHEXOL 350 MG/ML SOLN
100.0000 mL | Freq: Once | INTRAVENOUS | Status: AC | PRN
  Administered 2022-08-13: 80 mL via INTRAVENOUS

## 2022-08-14 ENCOUNTER — Other Ambulatory Visit (HOSPITAL_BASED_OUTPATIENT_CLINIC_OR_DEPARTMENT_OTHER): Payer: Medicare Other

## 2022-08-18 ENCOUNTER — Ambulatory Visit: Payer: Medicare Other | Attending: Cardiovascular Disease

## 2022-08-18 DIAGNOSIS — I480 Paroxysmal atrial fibrillation: Secondary | ICD-10-CM

## 2022-08-19 LAB — CBC
Hematocrit: 43.4 % (ref 37.5–51.0)
Hemoglobin: 14.8 g/dL (ref 13.0–17.7)
MCH: 29.6 pg (ref 26.6–33.0)
MCHC: 34.1 g/dL (ref 31.5–35.7)
MCV: 87 fL (ref 79–97)
Platelets: 176 10*3/uL (ref 150–450)
RBC: 5 x10E6/uL (ref 4.14–5.80)
RDW: 13.6 % (ref 11.6–15.4)
WBC: 6.6 10*3/uL (ref 3.4–10.8)

## 2022-08-19 LAB — BASIC METABOLIC PANEL
BUN/Creatinine Ratio: 9 — ABNORMAL LOW (ref 10–24)
BUN: 12 mg/dL (ref 8–27)
CO2: 23 mmol/L (ref 20–29)
Calcium: 9.6 mg/dL (ref 8.6–10.2)
Chloride: 103 mmol/L (ref 96–106)
Creatinine, Ser: 1.33 mg/dL — ABNORMAL HIGH (ref 0.76–1.27)
Glucose: 152 mg/dL — ABNORMAL HIGH (ref 70–99)
Potassium: 4.3 mmol/L (ref 3.5–5.2)
Sodium: 139 mmol/L (ref 134–144)
eGFR: 55 mL/min/{1.73_m2} — ABNORMAL LOW (ref >59)

## 2022-08-23 ENCOUNTER — Ambulatory Visit (INDEPENDENT_AMBULATORY_CARE_PROVIDER_SITE_OTHER): Payer: Medicare Other | Admitting: Vascular Surgery

## 2022-08-23 ENCOUNTER — Encounter: Payer: Self-pay | Admitting: Vascular Surgery

## 2022-08-23 VITALS — BP 137/74 | HR 79 | Temp 98.0°F | Resp 20 | Ht 70.0 in | Wt 191.0 lb

## 2022-08-23 DIAGNOSIS — I714 Abdominal aortic aneurysm, without rupture, unspecified: Secondary | ICD-10-CM

## 2022-08-23 NOTE — Progress Notes (Signed)
     Subjective:     Patient ID: Jonathon Stevens, male   DOB: [birth year], 77 y.o.   MRN: [id number]  HPI 77 year old male status post endovascular aneurysm repair.  He has atrial fibrillation is planned for ablation on June 12 and has been fully anticoagulated since surgery.  He feels chronic weakness due to his atrial fibrillation but is recovering well from surgery.   Review of Systems As above    Objective:   Physical Exam Vitals:   08/23/22 0845  BP: 137/74  Pulse: 79  Resp: 20  Temp: 98 F (36.7 C)  SpO2: 94%    Awake alert oriented Nonlabored respirations Abdomen is soft Bilateral groins soft without hematoma  CT IMPRESSION: VASCULAR   1. Infrarenal abdominal aortic and right common iliac artery aneurysms status hypogastric sparing endovascular repair. There is a prominent type 2 endoleak within the excluded abdominal aortic aneurysm sac arising from the inferior mesenteric artery and L3 lumbar arteries. Recommend close attention on follow-up imaging. 2. Additionally, there is a small amount of endoleak within the excluded right common iliac artery aneurysm sac of un certain etiology. The distal landing zone appears well opposed and I do not see any retrograde flow from the hypogastric. This may be a small type 3 endoleak at the overlap between the hypogastric stentgraft and bifurcated common iliac stent graft. Attention on follow-up imaging. 3. No evidence of access site complication. 4. Additional vascular findings as above without significant interval change.    Assessment/plan     77 year old male status post endovascular aneurysm repair now planned for atrial fibrillation ablation.  There are 2 areas of endoleak which we reviewed today cannot exactly tell where the endoleak in the common iliac artery arises but there is small common iliac artery really have no concerns at this time and possibly exist because he has been anticoagulated.  He will follow-up in  1 year with EVAR duplex.  Jonathon Stevens C. Randie Heinz, MD [facility] Office: [phone] Pager: [phone]

## 2022-08-28 ENCOUNTER — Telehealth (HOSPITAL_COMMUNITY): Payer: Self-pay | Admitting: *Deleted

## 2022-08-28 NOTE — Telephone Encounter (Signed)
Reaching out to patient to offer assistance regarding upcoming cardiac imaging study; pt verbalizes understanding of appt date/time, parking situation and where to check in, pre-test NPO status and verified current allergies; name and call back number provided for further questions should they arise  Owen Pratte RN Navigator Cardiac Imaging [facility] [phone] office [phone] cell  Patient to take his daily medications. 

## 2022-08-29 ENCOUNTER — Other Ambulatory Visit: Payer: Self-pay

## 2022-08-29 DIAGNOSIS — I7143 Infrarenal abdominal aortic aneurysm, without rupture: Secondary | ICD-10-CM

## 2022-08-30 ENCOUNTER — Ambulatory Visit (HOSPITAL_BASED_OUTPATIENT_CLINIC_OR_DEPARTMENT_OTHER)
Admission: RE | Admit: 2022-08-30 | Discharge: 2022-08-30 | Disposition: A | Discharge status: Home / Self Care | Payer: Medicare Other | Source: Ambulatory Visit | Attending: Cardiovascular Disease | Admitting: Cardiovascular Disease

## 2022-08-30 DIAGNOSIS — I480 Paroxysmal atrial fibrillation: Secondary | ICD-10-CM | POA: Diagnosis present

## 2022-08-30 MED ORDER — IOHEXOL 350 MG/ML SOLN
100.0000 mL | Freq: Once | INTRAVENOUS | Status: AC | PRN
  Administered 2022-08-30: 80 mL via INTRAVENOUS

## 2022-09-05 NOTE — Anesthesia Preprocedure Evaluation (Signed)
Anesthesia Evaluation  Patient identified by MRN, date of birth, ID band Patient awake    Reviewed: Allergy & Precautions, H&P , NPO status , Patient's Chart, lab work & pertinent test results  Airway Mallampati: I  TM Distance: >3 FB Neck ROM: Full    Dental no notable dental hx. (+) Edentulous Upper, Edentulous Lower, Upper Dentures, Lower Dentures   Pulmonary neg pulmonary ROS, COPD, former smoker   Pulmonary exam normal breath sounds clear to auscultation       Cardiovascular hypertension, Pt. on medications + Peripheral Vascular Disease (AAA)  negative cardio ROS Normal cardiovascular exam+ dysrhythmias Atrial Fibrillation  Rhythm:Regular Rate:Normal     Neuro/Psych CVA negative neurological ROS  negative psych ROS   GI/Hepatic negative GI ROS, Neg liver ROS,GERD  Medicated,,  Endo/Other  negative endocrine ROSdiabetes, Type 2    Renal/GU negative Renal ROS  negative genitourinary   Musculoskeletal negative musculoskeletal ROS (+)    Abdominal   Peds negative pediatric ROS (+)  Hematology negative hematology ROS (+) Blood dyscrasia, anemia   Anesthesia Other Findings   Reproductive/Obstetrics negative OB ROS                             Anesthesia Physical Anesthesia Plan  ASA: 4  Anesthesia Plan: General   Post-op Pain Management: Minimal or no pain anticipated   Induction: Intravenous  PONV Risk Score and Plan: 2 and Ondansetron, Dexamethasone and Treatment may vary due to age or medical condition  Airway Management Planned: Oral ETT  Additional Equipment: None  Intra-op Plan:   Post-operative Plan: Extubation in OR  Informed Consent: I have reviewed the patients History and Physical, chart, labs and discussed the procedure including the risks, benefits and alternatives for the proposed anesthesia with the patient or authorized representative who has indicated his/her  understanding and acceptance.     Dental advisory given  Plan Discussed with: CRNA and Anesthesiologist  Anesthesia Plan Comments: (PAT note by Antionette Poles, PA-C:  Follows cardiology for history of persistent A-fib on Tikosyn and Xarelto, prior CVA, HTN.  Cardiac clearance per telephone encounter 07/06/22 states, "Chart reviewed as part of pre-operative protocol coverage. Jonathon Stevens was last seen on 06/05/2022 by Dr. Nelly Laurence.  At that time he was scheduled for A-fib ablation on 09/06/2022.  Per correspondence with Dr. Nelly Laurence: "He can hold eliquis for two days prior to the surgery."Spoke with patient who reports overall he is doing well.  No chest pain, pressure, or tightness. Denies lower extremity edema, orthopnea, or PND.  He reports shortness of breath, DOE and fatigue with A-fib.  He continues to do his ADLs and normal household activities "just at a slower pace."  Therefore, based on ACC/AHA guidelines, the patient would be at acceptable risk for the planned procedure without further cardiovascular testing.Marland KitchenMarland KitchenWill likely require 2 day Xarelto hold. Given elevated CV risk including prior stroke, will require MD input. Looks like Dr Nelly Laurence has already commented that 2 day hold is acceptable. Procedure will also need to be completed in the next month ideally as he will require 3 weeks of uninterrupted anticoagulation prior to his afib ablation on 09/06/22. "  Follows with pulmonology for history of pulmonary fibrosis.  Seen by Dr. 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  PFTs showed mild restriction and moderate diffusion defect.  He was previously on Stiolto but did  not see any benefit from it.  Recommended follow-up with high-res CT in 6 months.  Non-insulin-dependent DM2, not well-controlled, A1c 7.9 on preop labs.  EKG 06/05/2022: Sinus rhythm with first-degree AV block with PACs.  Rate 59.  CTE high-resolution chest  06/30/2022: IMPRESSION: 1. Predominantly interlobular basilar subpleural septal thickening with trace intralobular subpleural reticulation. Early/mild interstitial lung disease, such as usual interstitial pneumonitis or nonspecific interstitial pneumonitis, cannot be excluded. Findings are indeterminate for UIP per consensus guidelines: Diagnosis of Idiopathic Pulmonary Fibrosis: An Official ATS/ERS/JRS/ALAT Clinical Practice Guideline. Am Rosezetta Schlatter Crit Care Med Vol 198, Iss 5, [phone], Nov 25 2016. 2. Tiny pulmonary nodules measure up to 4 mm. No follow-up needed if patient is low-risk (and has no known or suspected primary neoplasm). Non-contrast chest CT can be considered in 12 months if patient is high-risk. This recommendation follows the consensus statement: Guidelines for Management of Incidental Pulmonary Nodules Detected on CT Images: From the Fleischner Society 2017; Radiology 2017; 284:228-243. 3. Aortic atherosclerosis (ICD10-I70.0). Coronary artery calcification. 4. Enlarged pulmonic trunk, indicative of pulmonary arterial hypertension. 5.  Emphysema (ICD10-J43.9).  Nuclear stress 04/20/2021:   The study is normal. The study is low risk.   No ST deviation was noted.   LV perfusion is normal. There is no evidence of ischemia. There is no evidence of infarction.   Left ventricular function is normal. Nuclear stress EF: 71 %. The left ventricular ejection fraction is hyperdynamic (>65%). End diastolic cavity size is normal.   Prior study not available for comparison.  TTE 02/13/2021: 1. Left ventricular ejection fraction, by estimation, is 70 to 75%. The  left ventricle has hyperdynamic function. The left ventricle has no  regional wall motion abnormalities. There is mild left ventricular  hypertrophy. Left ventricular diastolic  function could not be evaluated.  2. Right ventricular systolic function is normal. The right ventricular  size is normal.  3. Left  atrial size was moderately dilated.  4. The mitral valve is normal in structure. Mild mitral valve  regurgitation. No evidence of mitral stenosis.  5. The aortic valve is tricuspid. Aortic valve regurgitation is not  visualized. Aortic valve sclerosis is present, with no evidence of aortic  valve stenosis.  6. The inferior vena cava is normal in size with greater than 50%  respiratory variability, suggesting right atrial pressure of 3 mmHg.     )        Anesthesia Quick Evaluation

## 2022-09-05 NOTE — Pre-Procedure Instructions (Signed)
Instructed patient on the following items: Arrival time 0600-630 Nothing to eat or drink after midnight No meds AM of procedure Responsible person to drive you home and stay with you for 24 hrs  Have you missed any doses of anti-coagulant Xarelto- takes once a day, hasn't missed any doses.  Don't take dose in the morning.

## 2022-09-06 ENCOUNTER — Other Ambulatory Visit (HOSPITAL_COMMUNITY): Payer: Self-pay

## 2022-09-06 ENCOUNTER — Ambulatory Visit (HOSPITAL_BASED_OUTPATIENT_CLINIC_OR_DEPARTMENT_OTHER): Payer: Medicare Other | Admitting: Anesthesiology

## 2022-09-06 ENCOUNTER — Ambulatory Visit (HOSPITAL_COMMUNITY)
Admission: RE | Admit: 2022-09-06 | Discharge: 2022-09-06 | Disposition: A | Discharge status: Home / Self Care | Payer: Medicare Other | Attending: Cardiovascular Disease | Admitting: Cardiovascular Disease

## 2022-09-06 ENCOUNTER — Other Ambulatory Visit: Payer: Self-pay

## 2022-09-06 ENCOUNTER — Ambulatory Visit (HOSPITAL_COMMUNITY): Payer: Medicare Other | Admitting: Anesthesiology

## 2022-09-06 ENCOUNTER — Encounter (HOSPITAL_COMMUNITY)
Admission: RE | Disposition: A | Discharge status: Home / Self Care | Payer: Self-pay | Source: Home / Self Care | Attending: Cardiovascular Disease

## 2022-09-06 DIAGNOSIS — Z87891 Personal history of nicotine dependence: Secondary | ICD-10-CM

## 2022-09-06 DIAGNOSIS — I10 Essential (primary) hypertension: Secondary | ICD-10-CM | POA: Diagnosis not present

## 2022-09-06 DIAGNOSIS — I484 Atypical atrial flutter: Secondary | ICD-10-CM | POA: Diagnosis not present

## 2022-09-06 DIAGNOSIS — Z7901 Long term (current) use of anticoagulants: Secondary | ICD-10-CM | POA: Diagnosis not present

## 2022-09-06 DIAGNOSIS — Z8673 Personal history of transient ischemic attack (TIA), and cerebral infarction without residual deficits: Secondary | ICD-10-CM | POA: Insufficient documentation

## 2022-09-06 DIAGNOSIS — J449 Chronic obstructive pulmonary disease, unspecified: Secondary | ICD-10-CM

## 2022-09-06 DIAGNOSIS — I4891 Unspecified atrial fibrillation: Secondary | ICD-10-CM | POA: Diagnosis not present

## 2022-09-06 DIAGNOSIS — Z8616 Personal history of COVID-19: Secondary | ICD-10-CM | POA: Insufficient documentation

## 2022-09-06 DIAGNOSIS — Z79899 Other long term (current) drug therapy: Secondary | ICD-10-CM | POA: Insufficient documentation

## 2022-09-06 DIAGNOSIS — I4819 Other persistent atrial fibrillation: Secondary | ICD-10-CM | POA: Diagnosis present

## 2022-09-06 HISTORY — PX: ATRIAL FIBRILLATION ABLATION: EP1191

## 2022-09-06 LAB — POCT ACTIVATED CLOTTING TIME
Activated Clotting Time: 305 seconds
Activated Clotting Time: 311 seconds

## 2022-09-06 SURGERY — ATRIAL FIBRILLATION ABLATION
Anesthesia: General

## 2022-09-06 MED ORDER — HEPARIN SODIUM (PORCINE) 1000 UNIT/ML IJ SOLN
INTRAMUSCULAR | Status: AC
  Filled 2022-09-06: qty 10

## 2022-09-06 MED ORDER — FENTANYL CITRATE (PF) 100 MCG/2ML IJ SOLN
INTRAMUSCULAR | Status: DC | PRN
  Administered 2022-09-06 (×4): 50 ug via INTRAVENOUS

## 2022-09-06 MED ORDER — DOBUTAMINE INFUSION FOR EP/ECHO/NUC (1000 MCG/ML)
INTRAVENOUS | Status: AC
  Filled 2022-09-06: qty 250

## 2022-09-06 MED ORDER — HEPARIN (PORCINE) IN NACL 1000-0.9 UT/500ML-% IV SOLN
INTRAVENOUS | Status: DC | PRN
  Administered 2022-09-06 (×5): 500 mL

## 2022-09-06 MED ORDER — SODIUM CHLORIDE 0.9 % IV SOLN: 250.0000 mL | INTRAVENOUS | Status: DC | PRN

## 2022-09-06 MED ORDER — SUGAMMADEX SODIUM 200 MG/2ML IV SOLN
INTRAVENOUS | Status: DC | PRN
  Administered 2022-09-06: 200 mg via INTRAVENOUS

## 2022-09-06 MED ORDER — PROPOFOL 10 MG/ML IV BOLUS
INTRAVENOUS | Status: DC | PRN
  Administered 2022-09-06: 120 mg via INTRAVENOUS

## 2022-09-06 MED ORDER — ROCURONIUM BROMIDE 10 MG/ML (PF) SYRINGE
PREFILLED_SYRINGE | INTRAVENOUS | Status: DC | PRN
  Administered 2022-09-06: 40 mg via INTRAVENOUS
  Administered 2022-09-06: 60 mg via INTRAVENOUS

## 2022-09-06 MED ORDER — ONDANSETRON HCL 4 MG/2ML IJ SOLN: 4.0000 mg | Freq: Four times a day (QID) | INTRAMUSCULAR | Status: DC | PRN

## 2022-09-06 MED ORDER — PROTAMINE SULFATE 10 MG/ML IV SOLN
INTRAVENOUS | Status: DC | PRN
  Administered 2022-09-06: 50 mg via INTRAVENOUS

## 2022-09-06 MED ORDER — DEXAMETHASONE SODIUM PHOSPHATE 10 MG/ML IJ SOLN
INTRAMUSCULAR | Status: DC | PRN
  Administered 2022-09-06: 5 mg via INTRAVENOUS

## 2022-09-06 MED ORDER — HEPARIN SODIUM (PORCINE) 1000 UNIT/ML IJ SOLN
INTRAMUSCULAR | Status: DC | PRN
  Administered 2022-09-06: 1000 [IU] via INTRAVENOUS

## 2022-09-06 MED ORDER — ONDANSETRON HCL 4 MG/2ML IJ SOLN
INTRAMUSCULAR | Status: DC | PRN
  Administered 2022-09-06: 4 mg via INTRAVENOUS

## 2022-09-06 MED ORDER — SODIUM CHLORIDE 0.9 % IV SOLN: INTRAVENOUS | Status: DC

## 2022-09-06 MED ORDER — DOBUTAMINE INFUSION FOR EP/ECHO/NUC (1000 MCG/ML)
INTRAVENOUS | Status: DC | PRN
  Administered 2022-09-06: 20 ug/kg/min via INTRAVENOUS

## 2022-09-06 MED ORDER — PHENYLEPHRINE HCL-NACL 20-0.9 MG/250ML-% IV SOLN
INTRAVENOUS | Status: DC | PRN
  Administered 2022-09-06: 30 ug/min via INTRAVENOUS

## 2022-09-06 MED ORDER — LIDOCAINE 2% (20 MG/ML) 5 ML SYRINGE
INTRAMUSCULAR | Status: DC | PRN
  Administered 2022-09-06: 100 mg via INTRAVENOUS

## 2022-09-06 MED ORDER — HEPARIN SODIUM (PORCINE) 1000 UNIT/ML IJ SOLN
INTRAMUSCULAR | Status: DC | PRN
  Administered 2022-09-06: 3000 [IU] via INTRAVENOUS
  Administered 2022-09-06: 16000 [IU] via INTRAVENOUS

## 2022-09-06 MED ORDER — COLCHICINE 0.6 MG PO TABS
0.6000 mg | ORAL_TABLET | Freq: Two times a day (BID) | ORAL | 0 refills | Status: DC
  Filled 2022-09-06: qty 10, 5d supply, fill #0

## 2022-09-06 MED ORDER — SODIUM CHLORIDE 0.9% FLUSH: 3.0000 mL | INTRAVENOUS | Status: DC | PRN

## 2022-09-06 MED ORDER — ACETAMINOPHEN 325 MG PO TABS
650.0000 mg | ORAL_TABLET | ORAL | Status: DC | PRN
  Administered 2022-09-06: 650 mg via ORAL
  Filled 2022-09-06: qty 2

## 2022-09-06 SURGICAL SUPPLY — 18 items
CATH ABLAT QDOT MICRO BI TC FJ (CATHETERS) IMPLANT
CATH OCTARAY 2.0 F 3-3-3-3-3 (CATHETERS) IMPLANT
CATH PIGTAIL STEERABLE D1 8.7 (WIRE) IMPLANT
CATH S-M CIRCA TEMP PROBE (CATHETERS) IMPLANT
CATH SOUNDSTAR ECO 8FR (CATHETERS) IMPLANT
CATH WEB BI DIR CSDF CRV REPRO (CATHETERS) IMPLANT
CLOSURE PERCLOSE PROSTYLE (VASCULAR PRODUCTS) IMPLANT
COVER SWIFTLINK CONNECTOR (BAG) ×1 IMPLANT
DEVICE CLOSURE MYNXGRIP 6/7F (Vascular Products) IMPLANT
PACK EP LATEX FREE (CUSTOM PROCEDURE TRAY) ×1
PACK EP LF (CUSTOM PROCEDURE TRAY) ×1 IMPLANT
PAD DEFIB RADIO PHYSIO CONN (PAD) ×1 IMPLANT
PATCH CARTO3 (PAD) IMPLANT
SHEATH CARTO VIZIGO MED CURVE (SHEATH) IMPLANT
SHEATH PINNACLE 8F 10CM (SHEATH) IMPLANT
SHEATH PINNACLE 9F 10CM (SHEATH) IMPLANT
SHEATH PROBE COVER 6X72 (BAG) IMPLANT
TUBING SMART ABLATE COOLFLOW (TUBING) IMPLANT

## 2022-09-06 NOTE — Discharge Instructions (Signed)

## 2022-09-06 NOTE — Anesthesia Postprocedure Evaluation (Signed)
Anesthesia Post Note  Patient: Jonathon Stevens  Procedure(s) Performed: ATRIAL FIBRILLATION ABLATION     Patient location during evaluation: PACU Anesthesia Type: General Level of consciousness: awake and alert Pain management: pain level controlled Vital Signs Assessment: post-procedure vital signs reviewed and stable Respiratory status: spontaneous breathing, nonlabored ventilation, respiratory function stable and patient connected to nasal cannula oxygen Cardiovascular status: blood pressure returned to baseline and stable Postop Assessment: no apparent nausea or vomiting Anesthetic complications: no  No notable events documented.  Last Vitals:  Vitals:   09/06/22 1430 09/06/22 1445  BP: 138/68 (!) 143/67  Pulse: (!) 57 (!) 56  Resp: 11 14  Temp:    SpO2: 92% 95%    Last Pain:  Vitals:   09/06/22 1406  TempSrc:   PainSc: 2                  [name]

## 2022-09-06 NOTE — Progress Notes (Signed)
Pt has left for home, TOC pharm stopped to drop off colchicine, called and spoke to son, he will stop by and pick it up in 10 minutes,

## 2022-09-06 NOTE — Transfer of Care (Signed)
Immediate Anesthesia Transfer of Care Note  Patient: Jonathon Stevens  Procedure(s) Performed: ATRIAL FIBRILLATION ABLATION  Patient Location: PACU and Cath Lab  Anesthesia Type:General  Level of Consciousness: awake and alert   Airway & Oxygen Therapy: Patient Spontanous Breathing and Patient connected to nasal cannula oxygen  Post-op Assessment: Report given to RN and Post -op Vital signs reviewed and stable  Post vital signs: Reviewed and stable  Last Vitals:  Vitals Value Taken Time  BP 127/71 09/06/22 1155  Temp    Pulse 61 09/06/22 1157  Resp 13 09/06/22 1157  SpO2 96 % 09/06/22 1157  Vitals shown include unvalidated device data.  Last Pain:  Vitals:   09/06/22 0710  TempSrc:   PainSc: 0-No pain         Complications: No notable events documented.

## 2022-09-06 NOTE — Progress Notes (Signed)
Pt arriving to holding area with EP Lab holding pressure to rt groin area. EP staff stating that the pt developed a level 1 hematoma. Pressure held during transport and 15 min while in holding area. Pt area appears soft but tender.

## 2022-09-06 NOTE — Anesthesia Procedure Notes (Signed)
Procedure Name: Intubation Date/Time: 09/06/2022 8:45 AM  Performed by: Samara Deist, CRNAPre-anesthesia Checklist: Patient identified, Emergency Drugs available, Suction available and Patient being monitored Patient Re-evaluated:Patient Re-evaluated prior to induction Oxygen Delivery Method: Circle System Utilized Preoxygenation: Pre-oxygenation with 100% oxygen Induction Type: IV induction Ventilation: Mask ventilation without difficulty Laryngoscope Size: Mac and 4 Grade View: Grade I Tube type: Oral Tube size: 7.5 mm Number of attempts: 1 Airway Equipment and Method: Stylet Placement Confirmation: ETT inserted through vocal cords under direct vision, positive ETCO2 and breath sounds checked- equal and bilateral Secured at: 22 cm Tube secured with: Tape Dental Injury: Teeth and Oropharynx as per pre-operative assessment

## 2022-09-06 NOTE — H&P (Addendum)
Electrophysiology Office Note:    Date:  09/06/2022   ID:  Jonathon Stevens, DOB November 15, 1945, MRN [id number]  PCP:  Barbie Banner, MD   [facility] Providers Cardiologist:  Lance Muss, MD     Referring MD: Nelly Laurence, Roberts Gaudy, MD   History of Present Illness:    Jonathon Stevens is a 77 y.o. male with a hx listed below, significant for atrial fibrillation, stroke, referred for arrhythmia management.  He was originally diagnosed with atrial fibrillation years ago during routine monitoring after a stroke.  It was not until winter 2021 when he was hospitalized with Covid pneumonia that he began to have recurrent symptomatic episodes. Dofetilide was started and controlled his AF for about 5 months, but he then began to have recurrence.  He monitors his atrial fibrillation with his Apple Watch and has noticed that the burden has increased-- now often 60-80%  He feels very fatigued and short of breath with atrial fibrillation.  He has not had any syncope, presyncope, anginal chest pain.  I reviewed the patient's CT and labs. There was no LAA thrombus. he  has not missed any doses of anticoagulation, and he took his dose last night. Since our clinic visit, he has had stenting of his right femoral artery and distal aorta. We will study the access site carefully with ultrasound.  When he was in clinic on May 8, he was in a 2:1 rhythm -- either a focal AT or atypical flutter.   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 ECHO 10/08/13: EF 45-50%, mild hypokinesis of apical myocardium. Mild LA dilation, mild RA dilation;  b.  TEE (10/09/13):  EF 50-55%, normal wall motion, no LAA clot   Hypertension    Pneumonia    Stroke [facility])     Past  Surgical History:  Procedure Laterality Date   ABDOMINAL AORTIC ENDOVASCULAR STENT GRAFT N/A 07/18/2022   Procedure: ABDOMINAL AORTIC ENDOVASCULAR STENT GRAFT;  Surgeon: Maeola Harman, MD;  Location: [facility] OR;  Service: Vascular;  Laterality: N/A;   CARDIOVERSION N/A 10/13/2021   Procedure: CARDIOVERSION;  Surgeon: Thomasene Ripple, DO;  Location: [facility] ENDOSCOPY;  Service: Cardiovascular;  Laterality: N/A;   CATARACT EXTRACTION Bilateral    ESOPHAGOGASTRODUODENOSCOPY (EGD) WITH PROPOFOL N/A 02/14/2021   Procedure: ESOPHAGOGASTRODUODENOSCOPY (EGD) WITH PROPOFOL;  Surgeon: Jeani Hawking, MD;  Location: [facility] ENDOSCOPY;  Service: Endoscopy;  Laterality: N/A;   HEMOSTASIS CLIP PLACEMENT  02/14/2021   Procedure: HEMOSTASIS CLIP PLACEMENT;  Surgeon: Jeani Hawking, MD;  Location: [facility] ENDOSCOPY;  Service: Endoscopy;;   INSERTION OF ILIAC STENT Right 07/18/2022   Procedure: INSERTION OF ILIAC BRANCH DEVICE;  Surgeon: Maeola Harman, MD;  Location: [facility] OR;  Service: Vascular;  Laterality: Right;   TEE WITHOUT CARDIOVERSION N/A 10/09/2013   Procedure: TRANSESOPHAGEAL ECHOCARDIOGRAM (TEE);  Surgeon: Chrystie Nose, MD;  Location: [facility] ENDOSCOPY;  Service: Cardiovascular;  Laterality: N/A;    Current Medications: Current Meds  Medication Sig   allopurinol (ZYLOPRIM) 300 MG tablet Take 150 mg by mouth daily after breakfast.   atorvastatin (LIPITOR) 10 MG tablet Take 1 tablet (10 mg total) by mouth daily.   CALCIUM PO Take 1 tablet by  mouth daily.   Cholecalciferol (VITAMIN D3) 125 MCG (5000 UT) CAPS Take 5,000 Units by mouth daily.   Coenzyme Q10 (COQ-10) 100 MG CAPS Take 100 mg by mouth daily.   dofetilide (TIKOSYN) 250 MCG capsule Take 1 capsule (250 mcg total) by mouth 2 (two) times daily.   GLUCOSAMINE-CHONDROITIN PO Take 1 tablet by mouth daily.    Magnesium 250 MG TABS Take 250 mg by mouth daily.   metoprolol tartrate (LOPRESSOR) 25 MG tablet TAKE 1 TABLET BY MOUTH TWICE DAILY AS NEEDED  FOR BREAKTHROUGH AFIB/HEART RATE OVER 100 (Patient taking differently: Take 12.5 mg by mouth 2 (two) times daily as needed (Breakthrough AFIB/ Heart Rate Over 100).)   Multiple Vitamin (MULTIVITAMIN) tablet Take 1 tablet by mouth daily.   pantoprazole (PROTONIX) 40 MG tablet Take 1 tablet (40 mg total) by mouth daily.   XARELTO 20 MG TABS tablet TAKE 1 TABLET(20 MG) BY MOUTH DAILY     Allergies:   Flexeril [cyclobenzaprine] and Indomethacin   Social and Family History: Reviewed in Epic  ROS:   Please see the history of present illness.    All other systems reviewed and are negative.  EKGs/Labs/Other Studies Reviewed Today:    Cardiac Studies & Procedures     STRESS TESTS  MYOCARDIAL PERFUSION IMAGING 04/20/2021  Narrative   The study is normal. The study is low risk.   No ST deviation was noted.   LV perfusion is normal. There is no evidence of ischemia. There is no evidence of infarction.   Left ventricular function is normal. Nuclear stress EF: 71 %. The left ventricular ejection fraction is hyperdynamic (>65%). End diastolic cavity size is normal.   Prior study not available for comparison.   ECHOCARDIOGRAM  ECHOCARDIOGRAM COMPLETE 02/13/2021  Narrative ECHOCARDIOGRAM REPORT    Patient Name:   Jonathon Stevens Date of Exam: 02/13/2021 Medical Rec #:  [id number]       Height:       70.0 in Accession #:    [id number]      Weight:       190.9 lb Date of Birth:  01-19-1946       BSA:          2.047 m Patient Age:    75 years        BP:           113/57 mmHg Patient Gender: M               HR:           107 bpm. Exam Location:  Inpatient  Procedure: 2D Echo, Cardiac Doppler and Color Doppler  Indications:    I48.1 Persistent atrial fibrillation  History:        Patient has no prior history of Echocardiogram examinations. Arrythmias:Atrial Fibrillation and Tachycardia.  Sonographer:    Vanetta Shawl Referring Phys: [id number] EMILY B MULLEN  IMPRESSIONS   1. Left  ventricular ejection fraction, by estimation, is 70 to 75%. The left ventricle has hyperdynamic function. The left ventricle has no regional wall motion abnormalities. There is mild left ventricular hypertrophy. Left ventricular diastolic function could not be evaluated. 2. Right ventricular systolic function is normal. The right ventricular size is normal. 3. Left atrial size was moderately dilated. 4. The mitral valve is normal in structure. Mild mitral valve regurgitation. No evidence of mitral stenosis. 5. The aortic valve is tricuspid. Aortic valve regurgitation is not visualized. Aortic valve sclerosis is present, with no evidence of aortic  valve stenosis. 6. The inferior vena cava is normal in size with greater than 50% respiratory variability, suggesting right atrial pressure of 3 mmHg.  FINDINGS Left Ventricle: Left ventricular ejection fraction, by estimation, is 70 to 75%. The left ventricle has hyperdynamic function. The left ventricle has no regional wall motion abnormalities. The left ventricular internal cavity size was normal in size. There is mild left ventricular hypertrophy. Left ventricular diastolic function could not be evaluated due to atrial fibrillation. Left ventricular diastolic function could not be evaluated.  Right Ventricle: The right ventricular size is normal. Right ventricular systolic function is normal.  Left Atrium: Left atrial size was moderately dilated.  Right Atrium: Right atrial size was normal in size.  Pericardium: There is no evidence of pericardial effusion.  Mitral Valve: The mitral valve is normal in structure. Mild mitral annular calcification. Mild mitral valve regurgitation. No evidence of mitral valve stenosis.  Tricuspid Valve: The tricuspid valve is normal in structure. Tricuspid valve regurgitation is trivial. No evidence of tricuspid stenosis.  Aortic Valve: The aortic valve is tricuspid. Aortic valve regurgitation is not visualized.  Aortic valve sclerosis is present, with no evidence of aortic valve stenosis. Aortic valve mean gradient measures 4.3 mmHg. Aortic valve peak gradient measures 7.2 mmHg. Aortic valve area, by VTI measures 2.53 cm.  Pulmonic Valve: The pulmonic valve was normal in structure. Pulmonic valve regurgitation is not visualized. No evidence of pulmonic stenosis.  Aorta: The aortic root is normal in size and structure.  Venous: The inferior vena cava is normal in size with greater than 50% respiratory variability, suggesting right atrial pressure of 3 mmHg.  IAS/Shunts: No atrial level shunt detected by color flow Doppler.   LEFT VENTRICLE PLAX 2D LVIDd:         3.80 cm   Diastology LVIDs:         2.10 cm   LV e' medial:    6.71 cm/s LV PW:         1.20 cm   LV E/e' medial:  20.6 LV IVS:        1.20 cm   LV e' lateral:   9.88 cm/s LVOT diam:     2.00 cm   LV E/e' lateral: 14.0 LV SV:         66 LV SV Index:   32 LVOT Area:     3.14 cm   RIGHT VENTRICLE          IVC RV Basal diam:  3.30 cm  IVC diam: 1.90 cm  LEFT ATRIUM             Index        RIGHT ATRIUM           Index LA diam:        4.90 cm 2.39 cm/m   RA Area:     17.80 cm LA Vol (A2C):   58.8 ml 28.73 ml/m  RA Volume:   48.60 ml  23.75 ml/m LA Vol (A4C):   74.0 ml 36.16 ml/m LA Biplane Vol: 71.1 ml 34.74 ml/m AORTIC VALVE AV Area (Vmax):    2.65 cm AV Area (Vmean):   2.57 cm AV Area (VTI):     2.53 cm AV Vmax:           134.00 cm/s AV Vmean:          96.433 cm/s AV VTI:            0.261 m AV Peak Grad:  7.2 mmHg AV Mean Grad:      4.3 mmHg LVOT Vmax:         113.00 cm/s LVOT Vmean:        78.900 cm/s LVOT VTI:          0.210 m LVOT/AV VTI ratio: 0.81  AORTA Ao Root diam: 3.10 cm Ao Asc diam:  2.90 cm  MITRAL VALVE MV Area (PHT): 6.17 cm     SHUNTS MV Decel Time: 123 msec     Systemic VTI:  0.21 m MV E velocity: 138.00 cm/s  Systemic Diam: 2.00 cm  Olga Millers MD Electronically signed by Olga Millers MD Signature Date/Time: 02/13/2021/1:40:16 PM    Final    MONITORS  LONG TERM MONITOR (3-14 DAYS) 09/10/2021  Narrative  Atrial fibrillation, atrial flutter. Average heart rate 87 bpm.  Rare PACs and PVCs.  2 episodes of ventricular tachycardia.  Chart review shows that the strips were reviewed with Dr. Ladona Ridgel who recommended EP consult.   Patch Wear Time:  14 days and 0 hours (2023-05-24T10:07:35-0400 to 2023-06-07T10:07:39-0400)  2 Ventricular Tachycardia runs occurred, the run with the fastest interval lasting 8.8 secs with a max rate of 167 bpm (avg 154 bpm); the run with the fastest interval was also the longest. Atrial Fibrillation/Flutter occurred continuously (100% burden), ranging from 26-183 bpm (avg of 87 bpm). 1741 Pauses occurred, the longest lasting 10.5 secs (6 bpm). Isolated VEs were rare (<1.0%, 10597), VE Couplets were rare (<1.0%, 104), and VE Triplets were rare (<1.0%, 12). Ventricular Trigeminy was present. MD notification criteria for Slow Atrial Fibrillation/Atrial Flutter met and Pauses met - report posted prior to notification per account request (AG).           EKG:  Last EKG results: today Sinus rhythm with PVCs   Recent Labs: 07/14/2022: ALT 26 07/18/2022: Magnesium 1.9 08/18/2022: BUN 12; Creatinine, Ser 1.33; Hemoglobin 14.8; Platelets 176; Potassium 4.3; Sodium 139     Physical Exam:    VS:  BP (!) 152/63   Pulse (!) 58   Temp 98.1 F (36.7 C) (Oral)   Resp 20   Ht 5\' 10"  (1.778 m)   Wt 86.2 kg   SpO2 95%   BMI 27.26 kg/m     Wt Readings from Last 3 Encounters:  09/06/22 86.2 kg  08/23/22 86.6 kg  08/02/22 84.6 kg     GEN: Well nourished, well developed in no acute distress CARDIAC: RRR, no murmurs, rubs, gallops RESPIRATORY:  Normal work of breathing MUSCULOSKELETAL: no edema    ASSESSMENT & PLAN:    Atrial fibrillation: persistent, on Tikosyn with recurrent paroxysms of AF. AF burden is increasing, he is  struggling with fatigue, no energy. He would like to proceed with ablation. Procedural risks were discussed previously. All questions answered today.   Stroke: continue xarelto 20mg  BID HTN: no changes today        Medication Adjustments/Labs and Tests Ordered: Current medicines are reviewed at length with the patient today.  Concerns regarding medicines are outlined above.  Orders Placed This Encounter  Procedures   Informed Consent Details: Physician/Practitioner Attestation; Transcribe to consent form and obtain patient signature   Initiate Pre-op Protocol   Void on call to EP Lab   Confirm CBC and BMP (or CMP) results within 7 days for inpatient and 30 days for outpatient:   Clip right and left femoral area PM before surgery   Clip right internal jugular area PM before surgery   Pre-admission  testing diagnosis   EP STUDY   Insert peripheral IV   Meds ordered this encounter  Medications   0.9 %  sodium chloride infusion     Signed, Maurice Small, MD  09/06/2022 7:33 AM    [facility]

## 2022-09-07 ENCOUNTER — Encounter (HOSPITAL_COMMUNITY): Payer: Self-pay | Admitting: Cardiovascular Disease

## 2022-09-07 LAB — POCT ACTIVATED CLOTTING TIME: Activated Clotting Time: 275 seconds

## 2022-09-08 ENCOUNTER — Encounter: Payer: Self-pay | Admitting: Internal Medicine

## 2022-09-08 ENCOUNTER — Ambulatory Visit: Payer: Medicare Other | Attending: Internal Medicine | Admitting: Internal Medicine

## 2022-09-08 VITALS — BP 142/76 | HR 59 | Ht 70.0 in | Wt 197.6 lb

## 2022-09-08 DIAGNOSIS — I4819 Other persistent atrial fibrillation: Secondary | ICD-10-CM

## 2022-09-08 NOTE — Patient Instructions (Signed)
Medication Instructions:  Your physician recommends that you continue on your current medications as directed. Please refer to the Current Medication list given to you today.  *If you need a refill on your cardiac medications before your next appointment, please call your pharmacy   Follow-Up: At [facility], you and your health needs are our priority.  As part of our continuing mission to provide you with exceptional heart care, we have created designated Provider Care Teams.  These Care Teams include your primary Cardiologist (physician) and Advanced Practice Providers (APPs -  Physician Assistants and Nurse Practitioners) who all work together to provide you with the care you need, when you need it.  Your next appointment:   As scheduled  

## 2022-09-08 NOTE — Progress Notes (Signed)
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 He is a pleasant 77 yo man who was started on dofetilide several months ago. He went from longstanding persistent fib/flutter to PAF with long periods of NSR. Over time his afib returned and he underwent cathter ablation a couple of days ago. In the interim, he notes that he feels better. His leg is a little sore.  Allergies  Allergen Reactions   Flexeril [Cyclobenzaprine] Nausea Only   Indomethacin Rash     Current Outpatient Medications  Medication Sig Dispense Refill   allopurinol (ZYLOPRIM) 300 MG tablet Take 150 mg by mouth daily after breakfast.     atorvastatin (LIPITOR) 10 MG tablet Take 1 tablet (10 mg total) by mouth daily. 90 tablet 3   blood glucose meter kit and supplies KIT Dispense based on patient and insurance preference. Use up to four times daily as directed. (FOR ICD-9 250.00, 250.01). 1 each 0   CALCIUM PO Take 1 tablet by mouth daily.     Cholecalciferol (VITAMIN D3) 125 MCG (5000 UT) CAPS Take 5,000 Units by mouth daily.     Coenzyme Q10 (COQ-10) 100 MG CAPS Take 100 mg by mouth daily.     colchicine 0.6 MG tablet Take 1 tablet (0.6 mg total) by mouth 2 (two) times daily for 5 days. 10 tablet 0   dofetilide (TIKOSYN) 250 MCG capsule Take 1 capsule (250 mcg total) by mouth 2 (two) times daily. 180 capsule 3   GLUCOSAMINE-CHONDROITIN PO Take 1 tablet by mouth daily.      Magnesium 250 MG TABS Take 250 mg by mouth daily.     metoprolol tartrate (LOPRESSOR) 25 MG tablet TAKE 1 TABLET BY MOUTH TWICE DAILY AS NEEDED FOR BREAKTHROUGH AFIB/HEART RATE OVER 100 (Patient taking differently: Take 12.5 mg by mouth 2 (two) times daily as needed (Breakthrough AFIB/ Heart Rate Over 100).) 90 tablet 1   Multiple Vitamin (MULTIVITAMIN) tablet Take 1 tablet by mouth daily.     pantoprazole (PROTONIX) 40 MG tablet Take 1 tablet (40 mg total) by mouth daily. 30  tablet 1   XARELTO 20 MG TABS tablet TAKE 1 TABLET(20 MG) BY MOUTH DAILY 90 tablet 1   No current facility-administered medications for this visit.     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 ECHO 10/08/13: EF 45-50%, mild hypokinesis of apical myocardium. Mild LA dilation, mild RA dilation;  b.  TEE (10/09/13):  EF 50-55%, normal wall motion, no LAA clot   Hypertension    Pneumonia    Stroke (HCC)     ROS:   All systems reviewed and negative except as noted in the HPI.   Past Surgical History:  Procedure Laterality Date   ABDOMINAL AORTIC ENDOVASCULAR STENT GRAFT N/A 07/18/2022   Procedure: ABDOMINAL AORTIC ENDOVASCULAR STENT GRAFT;  Surgeon: Maeola Harman, MD;  Location: [name] OR;  Service: Vascular;  Laterality: N/A;   ATRIAL FIBRILLATION ABLATION N/A 09/06/2022   Procedure: ATRIAL FIBRILLATION ABLATION;  Surgeon: Maurice Small, MD;  Location: [facility] INVASIVE CV LAB;  Service: Cardiovascular;  Laterality: N/A;  CARDIOVERSION N/A 10/13/2021   Procedure: CARDIOVERSION;  Surgeon: Thomasene Ripple, DO;  Location: [facility] ENDOSCOPY;  Service: Cardiovascular;  Laterality: N/A;   CATARACT EXTRACTION Bilateral    ESOPHAGOGASTRODUODENOSCOPY (EGD) WITH PROPOFOL N/A 02/14/2021   Procedure: ESOPHAGOGASTRODUODENOSCOPY (EGD) WITH PROPOFOL;  Surgeon: Jeani Hawking, MD;  Location: [facility] ENDOSCOPY;  Service: Endoscopy;  Laterality: N/A;   HEMOSTASIS CLIP PLACEMENT  02/14/2021   Procedure: HEMOSTASIS CLIP PLACEMENT;  Surgeon: Jeani Hawking, MD;  Location: [facility] ENDOSCOPY;  Service: Endoscopy;;   INSERTION OF ILIAC STENT Right 07/18/2022   Procedure: INSERTION OF ILIAC BRANCH DEVICE;  Surgeon: Maeola Harman, MD;  Location: [facility] OR;  Service:  Vascular;  Laterality: Right;   TEE WITHOUT CARDIOVERSION N/A 10/09/2013   Procedure: TRANSESOPHAGEAL ECHOCARDIOGRAM (TEE);  Surgeon: Chrystie Nose, MD;  Location: [facility] ENDOSCOPY;  Service: Cardiovascular;  Laterality: N/A;     Family History  Problem Relation Age of Onset   Hypertension Mother    Stroke Mother    Hypertension Father    Heart attack Neg Hx      Social History   Socioeconomic History   Marital status: Married    Spouse name: Olegario Messier   Number of children: 2   Years of education: college   Highest education level: Not on file  Occupational History   Occupation: retired   Tobacco Use   Smoking status: Former    Packs/day: 0.50    Years: 15.00    Additional pack years: 0.00    Total pack years: 7.50    Types: Cigarettes    Quit date: 07/13/2015    Years since quitting: 7.1    Passive exposure: Never   Smokeless tobacco: Never   Tobacco comments:    Former smoker 10/11/21  Vaping Use   Vaping Use: Never used  Substance and Sexual Activity   Alcohol use: Yes    Alcohol/week: 1.0 standard drink of alcohol    Types: 1 Standard drinks or equivalent per week    Comment: 1 beer once a month 04/27/22   Drug use: No   Sexual activity: Yes  Other Topics Concern   Not on file  Social History Narrative   Patient is married lives with Spouse Olegario Messier).   Patient is right handed.   Patient has a Geographical information systems officer   Patient drinks 1 cup of coffee daily   Social Determinants of Health   Financial Resource Strain: Not on file  Food Insecurity: Not on file  Transportation Needs: Not on file  Physical Activity: Not on file  Stress: Not on file  Social Connections: Not on file  Intimate Partner Violence: Not on file     BP (!) 142/76   Pulse (!) 59   Ht 5\' 10"  (1.778 m)   Wt 197 lb 9.6 oz (89.6 kg)   SpO2 96%   BMI 28.35 kg/m   Physical Exam:  Well appearing NAD HEENT: Unremarkable Neck:  No JVD, no thyromegally Lymphatics:  No adenopathy Back:  No  CVA tenderness Lungs:  Clear HEART:  Regular rate rhythm, no murmurs, no rubs, no clicks Abd:  soft, positive bowel sounds, no organomegally, no rebound, no guarding Ext:  2 plus pulses, no edema, no cyanosis, no clubbing Skin:  No rashes no nodules Neuro:  CN II through XII intact, motor grossly intact  EKG - sinus bradycardia  Assess/Plan:  Persistent atrial fib- he has undergone atrial fib RFA which included a posterior wall box due to atrial flutter involving the LA. Since his  procedure he feels better. Minimal leg soreness. Long pauses - these have resolved. HTN - his bp at home is usually well controlled.  Dyslipidemia - he will continue his statin therapy. Disp - he will follow up with Dr. Nelly Laurence in about 3 months.   Sharlot Gowda Zaidy Absher,MD

## 2022-09-13 NOTE — Telephone Encounter (Signed)
Closing encounter

## 2022-10-04 ENCOUNTER — Ambulatory Visit (HOSPITAL_COMMUNITY)
Admission: RE | Admit: 2022-10-04 | Discharge: 2022-10-04 | Disposition: A | Discharge status: Home / Self Care | Payer: Medicare Other | Source: Ambulatory Visit | Attending: Internal Medicine | Admitting: Internal Medicine

## 2022-10-04 VITALS — BP 162/80 | HR 59 | Ht 70.0 in | Wt 189.8 lb

## 2022-10-04 DIAGNOSIS — Z823 Family history of stroke: Secondary | ICD-10-CM | POA: Insufficient documentation

## 2022-10-04 DIAGNOSIS — D6869 Other thrombophilia: Secondary | ICD-10-CM | POA: Diagnosis not present

## 2022-10-04 DIAGNOSIS — I4819 Other persistent atrial fibrillation: Secondary | ICD-10-CM

## 2022-10-04 DIAGNOSIS — I2510 Atherosclerotic heart disease of native coronary artery without angina pectoris: Secondary | ICD-10-CM | POA: Diagnosis not present

## 2022-10-04 DIAGNOSIS — Z7901 Long term (current) use of anticoagulants: Secondary | ICD-10-CM | POA: Insufficient documentation

## 2022-10-04 DIAGNOSIS — Z8673 Personal history of transient ischemic attack (TIA), and cerebral infarction without residual deficits: Secondary | ICD-10-CM | POA: Diagnosis not present

## 2022-10-04 DIAGNOSIS — I129 Hypertensive chronic kidney disease with stage 1 through stage 4 chronic kidney disease, or unspecified chronic kidney disease: Secondary | ICD-10-CM | POA: Insufficient documentation

## 2022-10-04 DIAGNOSIS — I484 Atypical atrial flutter: Secondary | ICD-10-CM | POA: Diagnosis not present

## 2022-10-04 DIAGNOSIS — I4892 Unspecified atrial flutter: Secondary | ICD-10-CM | POA: Insufficient documentation

## 2022-10-04 DIAGNOSIS — Z79899 Other long term (current) drug therapy: Secondary | ICD-10-CM | POA: Diagnosis not present

## 2022-10-04 DIAGNOSIS — Z5181 Encounter for therapeutic drug level monitoring: Secondary | ICD-10-CM | POA: Diagnosis not present

## 2022-10-04 LAB — MAGNESIUM: Magnesium: 2.1 mg/dL (ref 1.7–2.4)

## 2022-10-04 NOTE — Addendum Note (Signed)
Encounter addended by: Learta Codding, CMA on: 10/04/2022 10:24 AM  Actions taken: Vitals modified

## 2022-10-04 NOTE — Progress Notes (Addendum)
Primary Care Physician: Barbie Banner, MD Primary Cardiologist: Dr Eldridge Dace  Primary Electrophysiologist: Dr Ladona Ridgel Referring Physician: Dr Ladona Ridgel   Jonathon Stevens is a 77 y.o. male with a history of CVA, AAA, right common iliac aneurysm, HLD, pulmonary fibrosis, COPD, CKD, esophageal bleed s/p clipping, atrial flutter, atrial fibrillation who presents for follow up in the [facility]. Patient is on Xarelto for a CHADS2VASC score of 6. He wore a cardiac monitor which showed 100% afib burden but also did have VT (22 beats) and pauses up to 10.5 seconds. He was seen by Dr Ladona Ridgel 09/23/21 who recommended dofetilide.   Patient is s/p dofetilide loading 7/18-7/21/23 with DCCV on 10/13/21. He was in rate controlled atrial flutter on follow up but was symptomatically improved. Patient has continued to go in and out of rhythm since starting dofetilide. He has been set up for DCCV twice but converted to SR prior to each.   On follow up 04/27/22, patient reports that over the past 3-4 weeks his afib burden has increased. His smart watch shows a 61% burden. He has been much more fatigued. He has been dealing with some family stressors lately but no other specific triggers for his afib.   On follow up 08/02/22, he is currently in atrial flutter? Seen by Dr. Nelly Laurence on 3/11 and is scheduled for Afib ablation on 09/06/22. Patient called yesterday noting his HR was 143. He feels tired and short of breath with walking when in Afib. No missed doses of anticoagulation.   On follow up 10/04/22, he is currently in NSR. Patient is s/p Afib and atypical atrial flutter ablation by Dr. Nelly Laurence on 09/06/22. No episodes of Afib since ablation. No chest pain, SOB, or trouble swallowing. Leg sites healed without issue. No missed doses of anticoagulant.  Today, he denies symptoms of orthopnea, PND, lower extremity edema, dizziness, presyncope, syncope, snoring, daytime somnolence, bleeding, or neurologic  sequela. The patient is tolerating medications without difficulties and is otherwise without complaint today.    Atrial Fibrillation Risk Factors:  he does not have symptoms or diagnosis of sleep apnea. he does not have a history of rheumatic fever.   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 ECHO 10/08/13: EF 45-50%, mild hypokinesis of apical myocardium. Mild LA dilation, mild RA dilation;  b.  TEE (10/09/13):  EF 50-55%, normal wall motion, no LAA clot   Hypertension    Pneumonia    Stroke [facility])    Past Surgical History:  Procedure Laterality Date   ABDOMINAL AORTIC ENDOVASCULAR STENT GRAFT N/A 07/18/2022   Procedure: ABDOMINAL AORTIC ENDOVASCULAR STENT GRAFT;  Surgeon: Maeola Harman, MD;  Location: [facility] OR;  Service:  Vascular;  Laterality: N/A;   ATRIAL FIBRILLATION ABLATION N/A 09/06/2022   Procedure: ATRIAL FIBRILLATION ABLATION;  Surgeon: Maurice Small, MD;  Location: [facility] INVASIVE CV LAB;  Service: Cardiovascular;  Laterality: N/A;   CARDIOVERSION N/A 10/13/2021   Procedure: CARDIOVERSION;  Surgeon: Thomasene Ripple, DO;  Location: [facility] ENDOSCOPY;  Service: Cardiovascular;  Laterality: N/A;   CATARACT EXTRACTION Bilateral    ESOPHAGOGASTRODUODENOSCOPY (EGD) WITH PROPOFOL N/A  02/14/2021   Procedure: ESOPHAGOGASTRODUODENOSCOPY (EGD) WITH PROPOFOL;  Surgeon: Jeani Hawking, MD;  Location: [facility] ENDOSCOPY;  Service: Endoscopy;  Laterality: N/A;   HEMOSTASIS CLIP PLACEMENT  02/14/2021   Procedure: HEMOSTASIS CLIP PLACEMENT;  Surgeon: Jeani Hawking, MD;  Location: [facility] ENDOSCOPY;  Service: Endoscopy;;   INSERTION OF ILIAC STENT Right 07/18/2022   Procedure: INSERTION OF ILIAC BRANCH DEVICE;  Surgeon: Maeola Harman, MD;  Location: [facility] OR;  Service: Vascular;  Laterality: Right;   TEE WITHOUT CARDIOVERSION N/A 10/09/2013   Procedure: TRANSESOPHAGEAL ECHOCARDIOGRAM (TEE);  Surgeon: Chrystie Nose, MD;  Location: [facility] ENDOSCOPY;  Service: Cardiovascular;  Laterality: N/A;    Current Outpatient Medications  Medication Sig Dispense Refill   allopurinol (ZYLOPRIM) 300 MG tablet Take 150 mg by mouth daily after breakfast.     atorvastatin (LIPITOR) 10 MG tablet Take 1 tablet (10 mg total) by mouth daily. 90 tablet 3   blood glucose meter kit and supplies KIT Dispense based on patient and insurance preference. Use up to four times daily as directed. (FOR ICD-9 250.00, 250.01). 1 each 0   CALCIUM PO Take 1 tablet by mouth daily.     Cholecalciferol (VITAMIN D3) 125 MCG (5000 UT) CAPS Take 5,000 Units by mouth daily.     Coenzyme Q10 (COQ-10) 100 MG CAPS Take 100 mg by mouth daily.     dofetilide (TIKOSYN) 250 MCG capsule Take 1 capsule (250 mcg total) by mouth 2 (two) times daily. 180 capsule 3   GLUCOSAMINE-CHONDROITIN PO Take 1 tablet by mouth daily.      Magnesium 250 MG TABS Take 250 mg by mouth daily.     metoprolol tartrate (LOPRESSOR) 25 MG tablet TAKE 1 TABLET BY MOUTH TWICE DAILY AS NEEDED FOR BREAKTHROUGH AFIB/HEART RATE OVER 100 (Patient taking differently: Take 12.5 mg by mouth 2 (two) times daily as needed (Breakthrough AFIB/ Heart Rate Over 100).) 90 tablet 1   Multiple Vitamin (MULTIVITAMIN) tablet Take 1 tablet by mouth daily.     pantoprazole (PROTONIX) 40 MG  tablet Take 1 tablet (40 mg total) by mouth daily. 30 tablet 1   XARELTO 20 MG TABS tablet TAKE 1 TABLET(20 MG) BY MOUTH DAILY 90 tablet 1   No current facility-administered medications for this encounter.    Allergies  Allergen Reactions   Flexeril [Cyclobenzaprine] Nausea Only   Indomethacin Rash    ROS- All systems are reviewed and negative except as per the HPI above.  Physical Exam: Vitals:   10/04/22 0821  Pulse: (!) 59  Weight: 86.1 kg  Height: 5\' 10"  (1.778 m)    GEN- The patient is well appearing, alert and oriented x 3 today.   Neck - no JVD or carotid bruit noted Lungs- Clear to ausculation bilaterally, normal work of breathing Heart- Regular rate and rhythm with ectopy noted, no murmurs, rubs or gallops, PMI not laterally displaced Extremities- no clubbing, cyanosis, or edema Skin - no rash or ecchymosis noted   Wt Readings from Last 3 Encounters:  10/04/22 86.1 kg  09/08/22 89.6 kg  09/06/22 86.2 kg  EKG today demonstrates  Vent. rate 59 BPM PR interval 262 ms QRS duration 86 ms QT/QTcB 478/473 ms P-R-T axes 89 20 53 Sinus bradycardia with 1st degree A-V block with frequent Premature ventricular complexes Otherwise normal ECG When compared with ECG of 06-Sep-2022 12:26, PREVIOUS ECG IS PRESENT  Echo 02/13/21 demonstrated   1. Left ventricular ejection fraction, by estimation, is 70 to 75%. The  left ventricle has hyperdynamic function. The left ventricle has no  regional wall motion abnormalities. There is mild left ventricular  hypertrophy. Left ventricular diastolic function could not be evaluated.   2. Right ventricular systolic function is normal. The right ventricular  size is normal.   3. Left atrial size was moderately dilated.   4. The mitral valve is normal in structure. Mild mitral valve  regurgitation. No evidence of mitral stenosis.   5. The aortic valve is tricuspid. Aortic valve regurgitation is not  visualized. Aortic valve  sclerosis is present, with no evidence of aortic valve stenosis.   6. The inferior vena cava is normal in size with greater than 50%  respiratory variability, suggesting right atrial pressure of 3 mmHg.   Epic records are reviewed at length today  CHA2DS2-VASc Score = 7  The patient's score is based upon: CHF History: 0 HTN History: 1 Diabetes History: 1 Stroke History: 2 Vascular Disease History: 1 Age Score: 2 Gender Score: 0       ASSESSMENT AND PLAN: 1. Persistent Atrial Fibrillation/atrial flutter The patient's CHA2DS2-VASc score is 7, indicating a 11.2% annual risk of stroke.   S/p dofetilide admission 7/18-7/21/23 S/p Afib and atypical atrial flutter ablation on 09/06/22 by Dr. Nelly Laurence.   He is currently in NSR. He is doing well post ablation.   Continue dofetilide 250 mcg BID. QTc stable. Mag drawn today.  2. Secondary Hypercoagulable State (ICD10:  D68.69) The patient is at significant risk for stroke/thromboembolism based upon his CHA2DS2-VASc Score of 7.  Continue Rivaroxaban (Xarelto).  No missed doses.   3. HTN Elevated in office but his readings at home are much lower. Continue to trend at home.  4. CAD On statin No anginal symptoms.   Follow up as scheduled with Dr. Nelly Laurence.  Lake Bells, PA-C Afib Clinic [facility] [address] [place], [place] [address] [phone] 10/04/2022 9:06 AM

## 2022-10-16 ENCOUNTER — Other Ambulatory Visit: Payer: Self-pay | Admitting: Nurse Practitioner

## 2022-10-26 ENCOUNTER — Other Ambulatory Visit (HOSPITAL_COMMUNITY): Payer: Self-pay | Admitting: Internal Medicine

## 2022-10-30 ENCOUNTER — Telehealth: Payer: Self-pay | Admitting: Cardiovascular Disease

## 2022-10-30 NOTE — Telephone Encounter (Signed)
Spoke to patient, dicussed current medications and suggested Flonase nasal spray and Tylenol 500 mg prn.

## 2022-10-30 NOTE — Telephone Encounter (Signed)
  Pt c/o medication issue:  1. Name of Medication: cold medication  2. How are you currently taking this medication (dosage and times per day)?   3. Are you having a reaction (difficulty breathing--STAT)?   4. What is your medication issue? The patient mentioned that he woke up with a cold and a bad headache. He would like to inquire about which medications he can take that are compatible with his heart medications.

## 2022-10-30 NOTE — Telephone Encounter (Signed)
You want to avoid any product that says its "D" or "PE",  but DM (dextromethorphan) is ok to take for cough. The "D" means it has pseudoephedrine (Sudafed) in it and "PE" means it has phenylephrine in it. These both can increase your blood pressure and heart rate. You also want to avoid NSAID's like ibuprofen and naproxen. Tylenol is ok to take for pain/fever. You can try saline nasal rinses (such as a netti pot) for congestion along with Flonase or Atrovent. Cough drops are ok too for sore throat. Antihistamines like allegra and zyrtec are ok to take as well. These do not cause drowsiness typically. For a productive cough, can take generic or brand Mucinex. For a dry cough generic or brand Delsym (dextromethorphan).

## 2022-11-03 ENCOUNTER — Ambulatory Visit (HOSPITAL_BASED_OUTPATIENT_CLINIC_OR_DEPARTMENT_OTHER)
Admission: RE | Admit: 2022-11-03 | Discharge: 2022-11-03 | Disposition: A | Discharge status: Home / Self Care | Payer: Medicare Other | Source: Ambulatory Visit | Attending: Pulmonary Disease | Admitting: Pulmonary Disease

## 2022-11-03 DIAGNOSIS — J849 Interstitial pulmonary disease, unspecified: Secondary | ICD-10-CM | POA: Diagnosis present

## 2022-11-20 ENCOUNTER — Other Ambulatory Visit: Payer: Self-pay

## 2022-11-20 DIAGNOSIS — I480 Paroxysmal atrial fibrillation: Secondary | ICD-10-CM

## 2022-11-20 MED ORDER — RIVAROXABAN 20 MG PO TABS
20.0000 mg | ORAL_TABLET | Freq: Every day | ORAL | 1 refills | Status: DC
Start: 2022-11-20 — End: 2023-05-17

## 2022-11-20 NOTE — Telephone Encounter (Signed)
Prescription refill request for Xarelto received.  Indication:afib Last office visit:7/24 Weight:86.1  kg Age:77 Scr:1.33  5/24 CrCl:56.64  ml/min  Prescription refilled

## 2022-12-02 ENCOUNTER — Other Ambulatory Visit: Payer: Self-pay | Admitting: Internal Medicine

## 2022-12-08 ENCOUNTER — Ambulatory Visit: Payer: Medicare Other | Admitting: Cardiovascular Disease

## 2022-12-22 ENCOUNTER — Ambulatory Visit: Payer: Medicare Other | Admitting: Cardiovascular Disease

## 2023-01-12 ENCOUNTER — Ambulatory Visit: Payer: Medicare Other | Attending: Cardiovascular Disease | Admitting: Cardiovascular Disease

## 2023-01-12 ENCOUNTER — Encounter: Payer: Self-pay | Admitting: Cardiovascular Disease

## 2023-01-12 VITALS — BP 132/78 | HR 100 | Ht 70.5 in | Wt 184.6 lb

## 2023-01-12 DIAGNOSIS — I4819 Other persistent atrial fibrillation: Secondary | ICD-10-CM

## 2023-01-12 NOTE — OR Nursing (Signed)
Called patient with pre-procedure instructions for tomorrow.   Patient informed of:   Time to arrive for procedure. 0700 Remain NPO past midnight.  Must have a ride home and a responsible adult to remain with them for 24 hours post procedure.  Confirmed blood thinner. Confirmed no breaks in taking blood thinner for 3+ weeks prior to procedure, instructed to continue to take as prescribed, including the morning of the procedure.  Spoke with patient and he confirmed the above information.

## 2023-01-12 NOTE — Patient Instructions (Signed)
Medication Instructions:  Your physician recommends that you continue on your current medications as directed. Please refer to the Current Medication list given to you today. *If you need a refill on your cardiac medications before your next appointment, please call your pharmacy*   Testing/Procedures: Cardioversion - see letter Your physician has recommended that you have a Cardioversion (DCCV). Electrical Cardioversion uses a jolt of electricity to your heart either through paddles or wired patches attached to your chest. This is a controlled, usually prescheduled, procedure. Defibrillation is done under light anesthesia in the hospital, and you usually go home the day of the procedure. This is done to get your heart back into a normal rhythm. You are not awake for the procedure. Please see the instruction sheet given to you today.  Ablation - scheduled for 05/22/23 Your physician has recommended that you have an ablation. Catheter ablation is a medical procedure used to treat some cardiac arrhythmias (irregular heartbeats). During catheter ablation, a long, thin, flexible tube is put into a blood vessel in your groin (upper thigh), or neck. This tube is called an ablation catheter. It is then guided to your heart through the blood vessel. Radio frequency waves destroy small areas of heart tissue where abnormal heartbeats may cause an arrhythmia to start. Please see the instruction sheet given to you today.   Follow-Up: At [facility], you and your health needs are our priority.  As part of our continuing mission to provide you with exceptional heart care, we have created designated Provider Care Teams.  These Care Teams include your primary Cardiologist (physician) and Advanced Practice Providers (APPs -  Physician Assistants and Nurse Practitioners) who all work together to provide you with the care you need, when you need it.  We recommend signing up for the patient portal called  "MyChart".  Sign up information is provided on this After Visit Summary.  MyChart is used to connect with patients for Virtual Visits (Telemedicine).  Patients are able to view lab/test results, encounter notes, upcoming appointments, etc.  Non-urgent messages can be sent to your provider as well.   To learn more about what you can do with MyChart, go to [email or web address].    Your next appointment:   Keep follow up appointment in Jan 2025  Provider:   York Pellant, MD

## 2023-01-12 NOTE — Progress Notes (Signed)
Electrophysiology Office Note:    Date:  01/12/2023   ID:  Jonathon Stevens, DOB 1945-10-06, MRN [id number]  PCP:  Barbie Banner, MD   [facility] Providers Cardiologist:  Lance Muss, MD Electrophysiologist:  Maurice Small, MD     Referring MD: Barbie Banner, MD   History of Present Illness:    Jonathon Stevens is a 77 y.o. male with a hx listed below, significant for atrial fibrillation, stroke, referred for arrhythmia management.  He was originally diagnosed with atrial fibrillation years ago during routine monitoring after a stroke.  It was not until winter 2021 when he was hospitalized with Covid pneumonia that he began to have recurrent symptomatic episodes. Dofetilide was started and controlled his AF for about 5 months, but he then began to have recurrence.  He monitors his atrial fibrillation with his Apple Watch and has noticed that the burden has increased from about 10% up to roughly 50% over the past several months.  He feels very fatigued and short of breath with atrial fibrillation.  He has not had any syncope, presyncope, anginal chest pain.  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  The patient was in atypical flutter that terminated during isolation of the posterior wall.   He did well for a while after the ablation but has began to have elevated rates and irregular rhythms with increasing frequency.     EKGs/Labs/Other Studies Reviewed Today:    Cardiac Studies & Procedures     STRESS TESTS  MYOCARDIAL PERFUSION IMAGING 04/20/2021  Narrative   The study is normal. The study is low risk.   No ST deviation was noted.   LV perfusion is normal. There is no evidence of ischemia. There is no evidence of infarction.   Left ventricular function is normal. Nuclear stress EF: 71 %. The left ventricular ejection fraction is hyperdynamic (>65%). End diastolic cavity size is normal.   Prior study not  available for comparison.   ECHOCARDIOGRAM  ECHOCARDIOGRAM COMPLETE 02/13/2021  Narrative ECHOCARDIOGRAM REPORT    Patient Name:   Jonathon Stevens Date of Exam: 02/13/2021 Medical Rec #:  [id number]       Height:       70.0 in Accession #:    [id number]      Weight:       190.9 lb Date of Birth:  07/19/1945       BSA:          2.047 m Patient Age:    75 years        BP:           113/57 mmHg Patient Gender: M               HR:           107 bpm. Exam Location:  Inpatient  Procedure: 2D Echo, Cardiac Doppler and Color Doppler  Indications:    I48.1 Persistent atrial fibrillation  History:        Patient has no prior history of Echocardiogram examinations. Arrythmias:Atrial Fibrillation and Tachycardia.  Sonographer:    Vanetta Shawl Referring Phys: [id number] EMILY B MULLEN  IMPRESSIONS   1. Left ventricular ejection fraction, by estimation, is 70 to 75%. The left ventricle has hyperdynamic function. The left ventricle has no regional wall motion abnormalities. There is mild left ventricular hypertrophy. Left ventricular diastolic function could not be evaluated. 2. Right ventricular systolic function is normal. The right ventricular size is  Electrophysiology Office Note:    Date:  01/12/2023   ID:  Jonathon Stevens, DOB 1945-10-06, MRN [id number]  PCP:  Barbie Banner, MD   [facility] Providers Cardiologist:  Lance Muss, MD Electrophysiologist:  Maurice Small, MD     Referring MD: Barbie Banner, MD   History of Present Illness:    Jonathon Stevens is a 77 y.o. male with a hx listed below, significant for atrial fibrillation, stroke, referred for arrhythmia management.  He was originally diagnosed with atrial fibrillation years ago during routine monitoring after a stroke.  It was not until winter 2021 when he was hospitalized with Covid pneumonia that he began to have recurrent symptomatic episodes. Dofetilide was started and controlled his AF for about 5 months, but he then began to have recurrence.  He monitors his atrial fibrillation with his Apple Watch and has noticed that the burden has increased from about 10% up to roughly 50% over the past several months.  He feels very fatigued and short of breath with atrial fibrillation.  He has not had any syncope, presyncope, anginal chest pain.  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  The patient was in atypical flutter that terminated during isolation of the posterior wall.   He did well for a while after the ablation but has began to have elevated rates and irregular rhythms with increasing frequency.     EKGs/Labs/Other Studies Reviewed Today:    Cardiac Studies & Procedures     STRESS TESTS  MYOCARDIAL PERFUSION IMAGING 04/20/2021  Narrative   The study is normal. The study is low risk.   No ST deviation was noted.   LV perfusion is normal. There is no evidence of ischemia. There is no evidence of infarction.   Left ventricular function is normal. Nuclear stress EF: 71 %. The left ventricular ejection fraction is hyperdynamic (>65%). End diastolic cavity size is normal.   Prior study not  available for comparison.   ECHOCARDIOGRAM  ECHOCARDIOGRAM COMPLETE 02/13/2021  Narrative ECHOCARDIOGRAM REPORT    Patient Name:   Jonathon Stevens Date of Exam: 02/13/2021 Medical Rec #:  [id number]       Height:       70.0 in Accession #:    [id number]      Weight:       190.9 lb Date of Birth:  07/19/1945       BSA:          2.047 m Patient Age:    75 years        BP:           113/57 mmHg Patient Gender: M               HR:           107 bpm. Exam Location:  Inpatient  Procedure: 2D Echo, Cardiac Doppler and Color Doppler  Indications:    I48.1 Persistent atrial fibrillation  History:        Patient has no prior history of Echocardiogram examinations. Arrythmias:Atrial Fibrillation and Tachycardia.  Sonographer:    Vanetta Shawl Referring Phys: [id number] EMILY B MULLEN  IMPRESSIONS   1. Left ventricular ejection fraction, by estimation, is 70 to 75%. The left ventricle has hyperdynamic function. The left ventricle has no regional wall motion abnormalities. There is mild left ventricular hypertrophy. Left ventricular diastolic function could not be evaluated. 2. Right ventricular systolic function is normal. The right ventricular size is  Electrophysiology Office Note:    Date:  01/12/2023   ID:  Jonathon Stevens, DOB 1945-10-06, MRN [id number]  PCP:  Barbie Banner, MD   [facility] Providers Cardiologist:  Lance Muss, MD Electrophysiologist:  Maurice Small, MD     Referring MD: Barbie Banner, MD   History of Present Illness:    Jonathon Stevens is a 77 y.o. male with a hx listed below, significant for atrial fibrillation, stroke, referred for arrhythmia management.  He was originally diagnosed with atrial fibrillation years ago during routine monitoring after a stroke.  It was not until winter 2021 when he was hospitalized with Covid pneumonia that he began to have recurrent symptomatic episodes. Dofetilide was started and controlled his AF for about 5 months, but he then began to have recurrence.  He monitors his atrial fibrillation with his Apple Watch and has noticed that the burden has increased from about 10% up to roughly 50% over the past several months.  He feels very fatigued and short of breath with atrial fibrillation.  He has not had any syncope, presyncope, anginal chest pain.  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  The patient was in atypical flutter that terminated during isolation of the posterior wall.   He did well for a while after the ablation but has began to have elevated rates and irregular rhythms with increasing frequency.     EKGs/Labs/Other Studies Reviewed Today:    Cardiac Studies & Procedures     STRESS TESTS  MYOCARDIAL PERFUSION IMAGING 04/20/2021  Narrative   The study is normal. The study is low risk.   No ST deviation was noted.   LV perfusion is normal. There is no evidence of ischemia. There is no evidence of infarction.   Left ventricular function is normal. Nuclear stress EF: 71 %. The left ventricular ejection fraction is hyperdynamic (>65%). End diastolic cavity size is normal.   Prior study not  available for comparison.   ECHOCARDIOGRAM  ECHOCARDIOGRAM COMPLETE 02/13/2021  Narrative ECHOCARDIOGRAM REPORT    Patient Name:   Jonathon Stevens Date of Exam: 02/13/2021 Medical Rec #:  [id number]       Height:       70.0 in Accession #:    [id number]      Weight:       190.9 lb Date of Birth:  07/19/1945       BSA:          2.047 m Patient Age:    75 years        BP:           113/57 mmHg Patient Gender: M               HR:           107 bpm. Exam Location:  Inpatient  Procedure: 2D Echo, Cardiac Doppler and Color Doppler  Indications:    I48.1 Persistent atrial fibrillation  History:        Patient has no prior history of Echocardiogram examinations. Arrythmias:Atrial Fibrillation and Tachycardia.  Sonographer:    Vanetta Shawl Referring Phys: [id number] EMILY B MULLEN  IMPRESSIONS   1. Left ventricular ejection fraction, by estimation, is 70 to 75%. The left ventricle has hyperdynamic function. The left ventricle has no regional wall motion abnormalities. There is mild left ventricular hypertrophy. Left ventricular diastolic function could not be evaluated. 2. Right ventricular systolic function is normal. The right ventricular size is  Electrophysiology Office Note:    Date:  01/12/2023   ID:  Jonathon Stevens, DOB 1945-10-06, MRN [id number]  PCP:  Barbie Banner, MD   [facility] Providers Cardiologist:  Lance Muss, MD Electrophysiologist:  Maurice Small, MD     Referring MD: Barbie Banner, MD   History of Present Illness:    Jonathon Stevens is a 77 y.o. male with a hx listed below, significant for atrial fibrillation, stroke, referred for arrhythmia management.  He was originally diagnosed with atrial fibrillation years ago during routine monitoring after a stroke.  It was not until winter 2021 when he was hospitalized with Covid pneumonia that he began to have recurrent symptomatic episodes. Dofetilide was started and controlled his AF for about 5 months, but he then began to have recurrence.  He monitors his atrial fibrillation with his Apple Watch and has noticed that the burden has increased from about 10% up to roughly 50% over the past several months.  He feels very fatigued and short of breath with atrial fibrillation.  He has not had any syncope, presyncope, anginal chest pain.  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  The patient was in atypical flutter that terminated during isolation of the posterior wall.   He did well for a while after the ablation but has began to have elevated rates and irregular rhythms with increasing frequency.     EKGs/Labs/Other Studies Reviewed Today:    Cardiac Studies & Procedures     STRESS TESTS  MYOCARDIAL PERFUSION IMAGING 04/20/2021  Narrative   The study is normal. The study is low risk.   No ST deviation was noted.   LV perfusion is normal. There is no evidence of ischemia. There is no evidence of infarction.   Left ventricular function is normal. Nuclear stress EF: 71 %. The left ventricular ejection fraction is hyperdynamic (>65%). End diastolic cavity size is normal.   Prior study not  available for comparison.   ECHOCARDIOGRAM  ECHOCARDIOGRAM COMPLETE 02/13/2021  Narrative ECHOCARDIOGRAM REPORT    Patient Name:   Jonathon Stevens Date of Exam: 02/13/2021 Medical Rec #:  [id number]       Height:       70.0 in Accession #:    [id number]      Weight:       190.9 lb Date of Birth:  07/19/1945       BSA:          2.047 m Patient Age:    75 years        BP:           113/57 mmHg Patient Gender: M               HR:           107 bpm. Exam Location:  Inpatient  Procedure: 2D Echo, Cardiac Doppler and Color Doppler  Indications:    I48.1 Persistent atrial fibrillation  History:        Patient has no prior history of Echocardiogram examinations. Arrythmias:Atrial Fibrillation and Tachycardia.  Sonographer:    Vanetta Shawl Referring Phys: [id number] EMILY B MULLEN  IMPRESSIONS   1. Left ventricular ejection fraction, by estimation, is 70 to 75%. The left ventricle has hyperdynamic function. The left ventricle has no regional wall motion abnormalities. There is mild left ventricular hypertrophy. Left ventricular diastolic function could not be evaluated. 2. Right ventricular systolic function is normal. The right ventricular size is

## 2023-01-12 NOTE — H&P (View-Only) (Signed)
Electrophysiology Office Note:    Date:  01/12/2023   ID:  [name], DOB 1945-10-06, MRN [id number]  PCP:  Barbie Banner, MD   [facility] Providers Cardiologist:  Lance Muss, MD Electrophysiologist:  Maurice Small, MD     Referring MD: Barbie Banner, MD   History of Present Illness:    Jonathon Stevens is a 77 y.o. male with a hx listed below, significant for atrial fibrillation, stroke, referred for arrhythmia management.  He was originally diagnosed with atrial fibrillation years ago during routine monitoring after a stroke.  It was not until winter 2021 when he was hospitalized with Covid pneumonia that he began to have recurrent symptomatic episodes. Dofetilide was started and controlled his AF for about 5 months, but he then began to have recurrence.  He monitors his atrial fibrillation with his Apple Watch and has noticed that the burden has increased from about 10% up to roughly 50% over the past several months.  He feels very fatigued and short of breath with atrial fibrillation.  He has not had any syncope, presyncope, anginal chest pain.  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  The patient was in atypical flutter that terminated during isolation of the posterior wall.   He did well for a while after the ablation but has began to have elevated rates and irregular rhythms with increasing frequency.     EKGs/Labs/Other Studies Reviewed Today:    Cardiac Studies & Procedures     STRESS TESTS  MYOCARDIAL PERFUSION IMAGING 04/20/2021  Narrative   The study is normal. The study is low risk.   No ST deviation was noted.   LV perfusion is normal. There is no evidence of ischemia. There is no evidence of infarction.   Left ventricular function is normal. Nuclear stress EF: 71 %. The left ventricular ejection fraction is hyperdynamic (>65%). End diastolic cavity size is normal.   Prior study not  available for comparison.   ECHOCARDIOGRAM  ECHOCARDIOGRAM COMPLETE 02/13/2021  Narrative ECHOCARDIOGRAM REPORT    Patient Name:   Jonathon Stevens Date of Exam: 02/13/2021 Medical Rec #:  [id number]       Height:       70.0 in Accession #:    [id number]      Weight:       190.9 lb Date of Birth:  07/19/1945       BSA:          2.047 m Patient Age:    75 years        BP:           113/57 mmHg Patient Gender: M               HR:           107 bpm. Exam Location:  Inpatient  Procedure: 2D Echo, Cardiac Doppler and Color Doppler  Indications:    I48.1 Persistent atrial fibrillation  History:        Patient has no prior history of Echocardiogram examinations. Arrythmias:Atrial Fibrillation and Tachycardia.  Sonographer:    Vanetta Shawl Referring Phys: [id number] EMILY B MULLEN  IMPRESSIONS   1. Left ventricular ejection fraction, by estimation, is 70 to 75%. The left ventricle has hyperdynamic function. The left ventricle has no regional wall motion abnormalities. There is mild left ventricular hypertrophy. Left ventricular diastolic function could not be evaluated. 2. Right ventricular systolic function is normal. The right ventricular size is  Electrophysiology Office Note:    Date:  01/12/2023   ID:  [name], DOB 1945-10-06, MRN [id number]  PCP:  Barbie Banner, MD   [facility] Providers Cardiologist:  Lance Muss, MD Electrophysiologist:  Maurice Small, MD     Referring MD: Barbie Banner, MD   History of Present Illness:    Jonathon Stevens is a 77 y.o. male with a hx listed below, significant for atrial fibrillation, stroke, referred for arrhythmia management.  He was originally diagnosed with atrial fibrillation years ago during routine monitoring after a stroke.  It was not until winter 2021 when he was hospitalized with Covid pneumonia that he began to have recurrent symptomatic episodes. Dofetilide was started and controlled his AF for about 5 months, but he then began to have recurrence.  He monitors his atrial fibrillation with his Apple Watch and has noticed that the burden has increased from about 10% up to roughly 50% over the past several months.  He feels very fatigued and short of breath with atrial fibrillation.  He has not had any syncope, presyncope, anginal chest pain.  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  The patient was in atypical flutter that terminated during isolation of the posterior wall.   He did well for a while after the ablation but has began to have elevated rates and irregular rhythms with increasing frequency.     EKGs/Labs/Other Studies Reviewed Today:    Cardiac Studies & Procedures     STRESS TESTS  MYOCARDIAL PERFUSION IMAGING 04/20/2021  Narrative   The study is normal. The study is low risk.   No ST deviation was noted.   LV perfusion is normal. There is no evidence of ischemia. There is no evidence of infarction.   Left ventricular function is normal. Nuclear stress EF: 71 %. The left ventricular ejection fraction is hyperdynamic (>65%). End diastolic cavity size is normal.   Prior study not  available for comparison.   ECHOCARDIOGRAM  ECHOCARDIOGRAM COMPLETE 02/13/2021  Narrative ECHOCARDIOGRAM REPORT    Patient Name:   Jonathon Stevens Date of Exam: 02/13/2021 Medical Rec #:  [id number]       Height:       70.0 in Accession #:    [id number]      Weight:       190.9 lb Date of Birth:  07/19/1945       BSA:          2.047 m Patient Age:    75 years        BP:           113/57 mmHg Patient Gender: M               HR:           107 bpm. Exam Location:  Inpatient  Procedure: 2D Echo, Cardiac Doppler and Color Doppler  Indications:    I48.1 Persistent atrial fibrillation  History:        Patient has no prior history of Echocardiogram examinations. Arrythmias:Atrial Fibrillation and Tachycardia.  Sonographer:    Vanetta Shawl Referring Phys: [id number] EMILY B MULLEN  IMPRESSIONS   1. Left ventricular ejection fraction, by estimation, is 70 to 75%. The left ventricle has hyperdynamic function. The left ventricle has no regional wall motion abnormalities. There is mild left ventricular hypertrophy. Left ventricular diastolic function could not be evaluated. 2. Right ventricular systolic function is normal. The right ventricular size is  Electrophysiology Office Note:    Date:  01/12/2023   ID:  [name], DOB 1945-10-06, MRN [id number]  PCP:  Barbie Banner, MD   [facility] Providers Cardiologist:  Lance Muss, MD Electrophysiologist:  Maurice Small, MD     Referring MD: Barbie Banner, MD   History of Present Illness:    Jonathon Stevens is a 77 y.o. male with a hx listed below, significant for atrial fibrillation, stroke, referred for arrhythmia management.  He was originally diagnosed with atrial fibrillation years ago during routine monitoring after a stroke.  It was not until winter 2021 when he was hospitalized with Covid pneumonia that he began to have recurrent symptomatic episodes. Dofetilide was started and controlled his AF for about 5 months, but he then began to have recurrence.  He monitors his atrial fibrillation with his Apple Watch and has noticed that the burden has increased from about 10% up to roughly 50% over the past several months.  He feels very fatigued and short of breath with atrial fibrillation.  He has not had any syncope, presyncope, anginal chest pain.  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  The patient was in atypical flutter that terminated during isolation of the posterior wall.   He did well for a while after the ablation but has began to have elevated rates and irregular rhythms with increasing frequency.     EKGs/Labs/Other Studies Reviewed Today:    Cardiac Studies & Procedures     STRESS TESTS  MYOCARDIAL PERFUSION IMAGING 04/20/2021  Narrative   The study is normal. The study is low risk.   No ST deviation was noted.   LV perfusion is normal. There is no evidence of ischemia. There is no evidence of infarction.   Left ventricular function is normal. Nuclear stress EF: 71 %. The left ventricular ejection fraction is hyperdynamic (>65%). End diastolic cavity size is normal.   Prior study not  available for comparison.   ECHOCARDIOGRAM  ECHOCARDIOGRAM COMPLETE 02/13/2021  Narrative ECHOCARDIOGRAM REPORT    Patient Name:   Jonathon Stevens Date of Exam: 02/13/2021 Medical Rec #:  [id number]       Height:       70.0 in Accession #:    [id number]      Weight:       190.9 lb Date of Birth:  07/19/1945       BSA:          2.047 m Patient Age:    75 years        BP:           113/57 mmHg Patient Gender: M               HR:           107 bpm. Exam Location:  Inpatient  Procedure: 2D Echo, Cardiac Doppler and Color Doppler  Indications:    I48.1 Persistent atrial fibrillation  History:        Patient has no prior history of Echocardiogram examinations. Arrythmias:Atrial Fibrillation and Tachycardia.  Sonographer:    Vanetta Shawl Referring Phys: [id number] EMILY B MULLEN  IMPRESSIONS   1. Left ventricular ejection fraction, by estimation, is 70 to 75%. The left ventricle has hyperdynamic function. The left ventricle has no regional wall motion abnormalities. There is mild left ventricular hypertrophy. Left ventricular diastolic function could not be evaluated. 2. Right ventricular systolic function is normal. The right ventricular size is  Electrophysiology Office Note:    Date:  01/12/2023   ID:  [name], DOB 1945-10-06, MRN [id number]  PCP:  Barbie Banner, MD   [facility] Providers Cardiologist:  Lance Muss, MD Electrophysiologist:  Maurice Small, MD     Referring MD: Barbie Banner, MD   History of Present Illness:    Jonathon Stevens is a 77 y.o. male with a hx listed below, significant for atrial fibrillation, stroke, referred for arrhythmia management.  He was originally diagnosed with atrial fibrillation years ago during routine monitoring after a stroke.  It was not until winter 2021 when he was hospitalized with Covid pneumonia that he began to have recurrent symptomatic episodes. Dofetilide was started and controlled his AF for about 5 months, but he then began to have recurrence.  He monitors his atrial fibrillation with his Apple Watch and has noticed that the burden has increased from about 10% up to roughly 50% over the past several months.  He feels very fatigued and short of breath with atrial fibrillation.  He has not had any syncope, presyncope, anginal chest pain.  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  The patient was in atypical flutter that terminated during isolation of the posterior wall.   He did well for a while after the ablation but has began to have elevated rates and irregular rhythms with increasing frequency.     EKGs/Labs/Other Studies Reviewed Today:    Cardiac Studies & Procedures     STRESS TESTS  MYOCARDIAL PERFUSION IMAGING 04/20/2021  Narrative   The study is normal. The study is low risk.   No ST deviation was noted.   LV perfusion is normal. There is no evidence of ischemia. There is no evidence of infarction.   Left ventricular function is normal. Nuclear stress EF: 71 %. The left ventricular ejection fraction is hyperdynamic (>65%). End diastolic cavity size is normal.   Prior study not  available for comparison.   ECHOCARDIOGRAM  ECHOCARDIOGRAM COMPLETE 02/13/2021  Narrative ECHOCARDIOGRAM REPORT    Patient Name:   Jonathon Stevens Date of Exam: 02/13/2021 Medical Rec #:  [id number]       Height:       70.0 in Accession #:    [id number]      Weight:       190.9 lb Date of Birth:  07/19/1945       BSA:          2.047 m Patient Age:    75 years        BP:           113/57 mmHg Patient Gender: M               HR:           107 bpm. Exam Location:  Inpatient  Procedure: 2D Echo, Cardiac Doppler and Color Doppler  Indications:    I48.1 Persistent atrial fibrillation  History:        Patient has no prior history of Echocardiogram examinations. Arrythmias:Atrial Fibrillation and Tachycardia.  Sonographer:    Vanetta Shawl Referring Phys: [id number] EMILY B MULLEN  IMPRESSIONS   1. Left ventricular ejection fraction, by estimation, is 70 to 75%. The left ventricle has hyperdynamic function. The left ventricle has no regional wall motion abnormalities. There is mild left ventricular hypertrophy. Left ventricular diastolic function could not be evaluated. 2. Right ventricular systolic function is normal. The right ventricular size is

## 2023-01-12 NOTE — H&P (View-Only) (Signed)
Electrophysiology Office Note:    Date:  01/12/2023   ID:  Jonathon Stevens, DOB 1945-10-06, MRN [id number]  PCP:  Barbie Banner, MD   [facility] Providers Cardiologist:  Lance Muss, MD Electrophysiologist:  Maurice Small, MD     Referring MD: Barbie Banner, MD   History of Present Illness:    Jonathon Stevens is a 77 y.o. male with a hx listed below, significant for atrial fibrillation, stroke, referred for arrhythmia management.  He was originally diagnosed with atrial fibrillation years ago during routine monitoring after a stroke.  It was not until winter 2021 when he was hospitalized with Covid pneumonia that he began to have recurrent symptomatic episodes. Dofetilide was started and controlled his AF for about 5 months, but he then began to have recurrence.  He monitors his atrial fibrillation with his Apple Watch and has noticed that the burden has increased from about 10% up to roughly 50% over the past several months.  He feels very fatigued and short of breath with atrial fibrillation.  He has not had any syncope, presyncope, anginal chest pain.  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  The patient was in atypical flutter that terminated during isolation of the posterior wall.   He did well for a while after the ablation but has began to have elevated rates and irregular rhythms with increasing frequency.     EKGs/Labs/Other Studies Reviewed Today:    Cardiac Studies & Procedures     STRESS TESTS  MYOCARDIAL PERFUSION IMAGING 04/20/2021  Narrative   The study is normal. The study is low risk.   No ST deviation was noted.   LV perfusion is normal. There is no evidence of ischemia. There is no evidence of infarction.   Left ventricular function is normal. Nuclear stress EF: 71 %. The left ventricular ejection fraction is hyperdynamic (>65%). End diastolic cavity size is normal.   Prior study not  available for comparison.   ECHOCARDIOGRAM  ECHOCARDIOGRAM COMPLETE 02/13/2021  Narrative ECHOCARDIOGRAM REPORT    Patient Name:   Jonathon Stevens Date of Exam: 02/13/2021 Medical Rec #:  [id number]       Height:       70.0 in Accession #:    [id number]      Weight:       190.9 lb Date of Birth:  07/19/1945       BSA:          2.047 m Patient Age:    75 years        BP:           113/57 mmHg Patient Gender: M               HR:           107 bpm. Exam Location:  Inpatient  Procedure: 2D Echo, Cardiac Doppler and Color Doppler  Indications:    I48.1 Persistent atrial fibrillation  History:        Patient has no prior history of Echocardiogram examinations. Arrythmias:Atrial Fibrillation and Tachycardia.  Sonographer:    Vanetta Shawl Referring Phys: [id number] EMILY B MULLEN  IMPRESSIONS   1. Left ventricular ejection fraction, by estimation, is 70 to 75%. The left ventricle has hyperdynamic function. The left ventricle has no regional wall motion abnormalities. There is mild left ventricular hypertrophy. Left ventricular diastolic function could not be evaluated. 2. Right ventricular systolic function is normal. The right ventricular size is  Electrophysiology Office Note:    Date:  01/12/2023   ID:  Jonathon Stevens, DOB 1945-10-06, MRN [id number]  PCP:  Barbie Banner, MD   [facility] Providers Cardiologist:  Lance Muss, MD Electrophysiologist:  Maurice Small, MD     Referring MD: Barbie Banner, MD   History of Present Illness:    Jonathon Stevens is a 77 y.o. male with a hx listed below, significant for atrial fibrillation, stroke, referred for arrhythmia management.  He was originally diagnosed with atrial fibrillation years ago during routine monitoring after a stroke.  It was not until winter 2021 when he was hospitalized with Covid pneumonia that he began to have recurrent symptomatic episodes. Dofetilide was started and controlled his AF for about 5 months, but he then began to have recurrence.  He monitors his atrial fibrillation with his Apple Watch and has noticed that the burden has increased from about 10% up to roughly 50% over the past several months.  He feels very fatigued and short of breath with atrial fibrillation.  He has not had any syncope, presyncope, anginal chest pain.  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  The patient was in atypical flutter that terminated during isolation of the posterior wall.   He did well for a while after the ablation but has began to have elevated rates and irregular rhythms with increasing frequency.     EKGs/Labs/Other Studies Reviewed Today:    Cardiac Studies & Procedures     STRESS TESTS  MYOCARDIAL PERFUSION IMAGING 04/20/2021  Narrative   The study is normal. The study is low risk.   No ST deviation was noted.   LV perfusion is normal. There is no evidence of ischemia. There is no evidence of infarction.   Left ventricular function is normal. Nuclear stress EF: 71 %. The left ventricular ejection fraction is hyperdynamic (>65%). End diastolic cavity size is normal.   Prior study not  available for comparison.   ECHOCARDIOGRAM  ECHOCARDIOGRAM COMPLETE 02/13/2021  Narrative ECHOCARDIOGRAM REPORT    Patient Name:   Jonathon Stevens Date of Exam: 02/13/2021 Medical Rec #:  [id number]       Height:       70.0 in Accession #:    [id number]      Weight:       190.9 lb Date of Birth:  07/19/1945       BSA:          2.047 m Patient Age:    75 years        BP:           113/57 mmHg Patient Gender: M               HR:           107 bpm. Exam Location:  Inpatient  Procedure: 2D Echo, Cardiac Doppler and Color Doppler  Indications:    I48.1 Persistent atrial fibrillation  History:        Patient has no prior history of Echocardiogram examinations. Arrythmias:Atrial Fibrillation and Tachycardia.  Sonographer:    Vanetta Shawl Referring Phys: [id number] EMILY B MULLEN  IMPRESSIONS   1. Left ventricular ejection fraction, by estimation, is 70 to 75%. The left ventricle has hyperdynamic function. The left ventricle has no regional wall motion abnormalities. There is mild left ventricular hypertrophy. Left ventricular diastolic function could not be evaluated. 2. Right ventricular systolic function is normal. The right ventricular size is  Electrophysiology Office Note:    Date:  01/12/2023   ID:  Jonathon Stevens, DOB 1945-10-06, MRN [id number]  PCP:  Barbie Banner, MD   [facility] Providers Cardiologist:  Lance Muss, MD Electrophysiologist:  Maurice Small, MD     Referring MD: Barbie Banner, MD   History of Present Illness:    Jonathon Stevens is a 77 y.o. male with a hx listed below, significant for atrial fibrillation, stroke, referred for arrhythmia management.  He was originally diagnosed with atrial fibrillation years ago during routine monitoring after a stroke.  It was not until winter 2021 when he was hospitalized with Covid pneumonia that he began to have recurrent symptomatic episodes. Dofetilide was started and controlled his AF for about 5 months, but he then began to have recurrence.  He monitors his atrial fibrillation with his Apple Watch and has noticed that the burden has increased from about 10% up to roughly 50% over the past several months.  He feels very fatigued and short of breath with atrial fibrillation.  He has not had any syncope, presyncope, anginal chest pain.  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  The patient was in atypical flutter that terminated during isolation of the posterior wall.   He did well for a while after the ablation but has began to have elevated rates and irregular rhythms with increasing frequency.     EKGs/Labs/Other Studies Reviewed Today:    Cardiac Studies & Procedures     STRESS TESTS  MYOCARDIAL PERFUSION IMAGING 04/20/2021  Narrative   The study is normal. The study is low risk.   No ST deviation was noted.   LV perfusion is normal. There is no evidence of ischemia. There is no evidence of infarction.   Left ventricular function is normal. Nuclear stress EF: 71 %. The left ventricular ejection fraction is hyperdynamic (>65%). End diastolic cavity size is normal.   Prior study not  available for comparison.   ECHOCARDIOGRAM  ECHOCARDIOGRAM COMPLETE 02/13/2021  Narrative ECHOCARDIOGRAM REPORT    Patient Name:   Jonathon Stevens Date of Exam: 02/13/2021 Medical Rec #:  [id number]       Height:       70.0 in Accession #:    [id number]      Weight:       190.9 lb Date of Birth:  07/19/1945       BSA:          2.047 m Patient Age:    75 years        BP:           113/57 mmHg Patient Gender: M               HR:           107 bpm. Exam Location:  Inpatient  Procedure: 2D Echo, Cardiac Doppler and Color Doppler  Indications:    I48.1 Persistent atrial fibrillation  History:        Patient has no prior history of Echocardiogram examinations. Arrythmias:Atrial Fibrillation and Tachycardia.  Sonographer:    Vanetta Shawl Referring Phys: [id number] EMILY B MULLEN  IMPRESSIONS   1. Left ventricular ejection fraction, by estimation, is 70 to 75%. The left ventricle has hyperdynamic function. The left ventricle has no regional wall motion abnormalities. There is mild left ventricular hypertrophy. Left ventricular diastolic function could not be evaluated. 2. Right ventricular systolic function is normal. The right ventricular size is  Electrophysiology Office Note:    Date:  01/12/2023   ID:  Jonathon Stevens, DOB 1945-10-06, MRN [id number]  PCP:  Barbie Banner, MD   [facility] Providers Cardiologist:  Lance Muss, MD Electrophysiologist:  Maurice Small, MD     Referring MD: Barbie Banner, MD   History of Present Illness:    Jonathon Stevens is a 77 y.o. male with a hx listed below, significant for atrial fibrillation, stroke, referred for arrhythmia management.  He was originally diagnosed with atrial fibrillation years ago during routine monitoring after a stroke.  It was not until winter 2021 when he was hospitalized with Covid pneumonia that he began to have recurrent symptomatic episodes. Dofetilide was started and controlled his AF for about 5 months, but he then began to have recurrence.  He monitors his atrial fibrillation with his Apple Watch and has noticed that the burden has increased from about 10% up to roughly 50% over the past several months.  He feels very fatigued and short of breath with atrial fibrillation.  He has not had any syncope, presyncope, anginal chest pain.  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  The patient was in atypical flutter that terminated during isolation of the posterior wall.   He did well for a while after the ablation but has began to have elevated rates and irregular rhythms with increasing frequency.     EKGs/Labs/Other Studies Reviewed Today:    Cardiac Studies & Procedures     STRESS TESTS  MYOCARDIAL PERFUSION IMAGING 04/20/2021  Narrative   The study is normal. The study is low risk.   No ST deviation was noted.   LV perfusion is normal. There is no evidence of ischemia. There is no evidence of infarction.   Left ventricular function is normal. Nuclear stress EF: 71 %. The left ventricular ejection fraction is hyperdynamic (>65%). End diastolic cavity size is normal.   Prior study not  available for comparison.   ECHOCARDIOGRAM  ECHOCARDIOGRAM COMPLETE 02/13/2021  Narrative ECHOCARDIOGRAM REPORT    Patient Name:   Jonathon Stevens Date of Exam: 02/13/2021 Medical Rec #:  [id number]       Height:       70.0 in Accession #:    [id number]      Weight:       190.9 lb Date of Birth:  07/19/1945       BSA:          2.047 m Patient Age:    75 years        BP:           113/57 mmHg Patient Gender: M               HR:           107 bpm. Exam Location:  Inpatient  Procedure: 2D Echo, Cardiac Doppler and Color Doppler  Indications:    I48.1 Persistent atrial fibrillation  History:        Patient has no prior history of Echocardiogram examinations. Arrythmias:Atrial Fibrillation and Tachycardia.  Sonographer:    Vanetta Shawl Referring Phys: [id number] EMILY B MULLEN  IMPRESSIONS   1. Left ventricular ejection fraction, by estimation, is 70 to 75%. The left ventricle has hyperdynamic function. The left ventricle has no regional wall motion abnormalities. There is mild left ventricular hypertrophy. Left ventricular diastolic function could not be evaluated. 2. Right ventricular systolic function is normal. The right ventricular size is

## 2023-01-14 NOTE — Anesthesia Preprocedure Evaluation (Signed)
Anesthesia Evaluation    Reviewed: Allergy & Precautions, H&P , Patient's Chart, lab work & pertinent test results  Airway Mallampati: II  TM Distance: >3 FB Neck ROM: Full    Dental no notable dental hx.    Pulmonary COPD, former smoker   Pulmonary exam normal breath sounds clear to auscultation       Cardiovascular Exercise Tolerance: Good hypertension, Pt. on medications and Pt. on home beta blockers Normal cardiovascular exam+ dysrhythmias Atrial Fibrillation  Rhythm:Regular Rate:Normal     Neuro/Psych CVA negative neurological ROS  negative psych ROS   GI/Hepatic negative GI ROS, Neg liver ROS,GERD  Medicated,,  Endo/Other  diabetes    Renal/GU negative Renal ROS  negative genitourinary   Musculoskeletal   Abdominal   Peds  Hematology negative hematology ROS (+) Blood dyscrasia, anemia   Anesthesia Other Findings   Reproductive/Obstetrics negative OB ROS                             Anesthesia Physical Anesthesia Plan  ASA: 3  Anesthesia Plan: General   Post-op Pain Management: Minimal or no pain anticipated   Induction: Intravenous  PONV Risk Score and Plan: 2 and Propofol infusion  Airway Management Planned: Natural Airway and Nasal Cannula  Additional Equipment:   Intra-op Plan:   Post-operative Plan:   Informed Consent: I have reviewed the patients History and Physical, chart, labs and discussed the procedure including the risks, benefits and alternatives for the proposed anesthesia with the patient or authorized representative who has indicated his/her understanding and acceptance.       Plan Discussed with:   Anesthesia Plan Comments:        Anesthesia Quick Evaluation

## 2023-01-15 ENCOUNTER — Encounter (HOSPITAL_COMMUNITY): Payer: Self-pay | Admitting: Cardiology

## 2023-01-15 ENCOUNTER — Ambulatory Visit (HOSPITAL_COMMUNITY): Payer: Medicare Other | Admitting: Anesthesiology

## 2023-01-15 ENCOUNTER — Other Ambulatory Visit: Payer: Self-pay

## 2023-01-15 ENCOUNTER — Ambulatory Visit (HOSPITAL_BASED_OUTPATIENT_CLINIC_OR_DEPARTMENT_OTHER): Payer: Medicare Other | Admitting: Anesthesiology

## 2023-01-15 ENCOUNTER — Ambulatory Visit (HOSPITAL_COMMUNITY)
Admission: RE | Admit: 2023-01-15 | Discharge: 2023-01-15 | Disposition: A | Discharge status: Home / Self Care | Payer: Medicare Other | Attending: Cardiology | Admitting: Cardiology

## 2023-01-15 ENCOUNTER — Encounter (HOSPITAL_COMMUNITY)
Admission: RE | Disposition: A | Discharge status: Home / Self Care | Payer: Self-pay | Source: Home / Self Care | Attending: Cardiology

## 2023-01-15 DIAGNOSIS — E119 Type 2 diabetes mellitus without complications: Secondary | ICD-10-CM | POA: Diagnosis not present

## 2023-01-15 DIAGNOSIS — I4819 Other persistent atrial fibrillation: Secondary | ICD-10-CM | POA: Diagnosis present

## 2023-01-15 DIAGNOSIS — K219 Gastro-esophageal reflux disease without esophagitis: Secondary | ICD-10-CM | POA: Insufficient documentation

## 2023-01-15 DIAGNOSIS — Z79899 Other long term (current) drug therapy: Secondary | ICD-10-CM | POA: Insufficient documentation

## 2023-01-15 DIAGNOSIS — J449 Chronic obstructive pulmonary disease, unspecified: Secondary | ICD-10-CM | POA: Insufficient documentation

## 2023-01-15 DIAGNOSIS — I10 Essential (primary) hypertension: Secondary | ICD-10-CM | POA: Insufficient documentation

## 2023-01-15 DIAGNOSIS — Z87891 Personal history of nicotine dependence: Secondary | ICD-10-CM | POA: Diagnosis not present

## 2023-01-15 DIAGNOSIS — Z8673 Personal history of transient ischemic attack (TIA), and cerebral infarction without residual deficits: Secondary | ICD-10-CM | POA: Diagnosis not present

## 2023-01-15 DIAGNOSIS — I4892 Unspecified atrial flutter: Secondary | ICD-10-CM

## 2023-01-15 DIAGNOSIS — I480 Paroxysmal atrial fibrillation: Secondary | ICD-10-CM

## 2023-01-15 DIAGNOSIS — I484 Atypical atrial flutter: Secondary | ICD-10-CM | POA: Diagnosis not present

## 2023-01-15 DIAGNOSIS — I4891 Unspecified atrial fibrillation: Secondary | ICD-10-CM

## 2023-01-15 DIAGNOSIS — Z7901 Long term (current) use of anticoagulants: Secondary | ICD-10-CM | POA: Diagnosis not present

## 2023-01-15 DIAGNOSIS — D649 Anemia, unspecified: Secondary | ICD-10-CM | POA: Insufficient documentation

## 2023-01-15 HISTORY — PX: CARDIOVERSION: SHX1299

## 2023-01-15 LAB — POCT I-STAT, CHEM 8
BUN: 13 mg/dL (ref 8–23)
Calcium, Ion: 1.24 mmol/L (ref 1.15–1.40)
Chloride: 105 mmol/L (ref 98–111)
Creatinine, Ser: 1.3 mg/dL — ABNORMAL HIGH (ref 0.61–1.24)
Glucose, Bld: 158 mg/dL — ABNORMAL HIGH (ref 70–99)
HCT: 41 % (ref 39.0–52.0)
Hemoglobin: 13.9 g/dL (ref 13.0–17.0)
Potassium: 4 mmol/L (ref 3.5–5.1)
Sodium: 140 mmol/L (ref 135–145)
TCO2: 20 mmol/L — ABNORMAL LOW (ref 22–32)

## 2023-01-15 SURGERY — CARDIOVERSION
Anesthesia: General

## 2023-01-15 MED ORDER — PROPOFOL 10 MG/ML IV BOLUS
INTRAVENOUS | Status: DC | PRN
  Administered 2023-01-15: 80 mg via INTRAVENOUS

## 2023-01-15 MED ORDER — LIDOCAINE 2% (20 MG/ML) 5 ML SYRINGE
INTRAMUSCULAR | Status: DC | PRN
  Administered 2023-01-15: 80 mg via INTRAVENOUS

## 2023-01-15 SURGICAL SUPPLY — 1 items: PAD DEFIB RADIO PHYSIO CONN (PAD) ×1 IMPLANT

## 2023-01-15 NOTE — Transfer of Care (Signed)
Immediate Anesthesia Transfer of Care Note  Patient: Jonathon Stevens  Procedure(s) Performed: CARDIOVERSION  Patient Location: Cath Lab  Anesthesia Type:General  Level of Consciousness: drowsy and patient cooperative  Airway & Oxygen Therapy: Patient Spontanous Breathing and Patient connected to nasal cannula oxygen  Post-op Assessment: Report given to RN and Post -op Vital signs reviewed and stable  Post vital signs: Reviewed and stable  Last Vitals:  Vitals Value Taken Time  BP    Temp 36.5 C 01/15/23 0822  Pulse    Resp    SpO2      Last Pain:  Vitals:   01/15/23 0822  TempSrc: Temporal  PainSc:          Complications: No notable events documented.

## 2023-01-15 NOTE — CV Procedure (Signed)
Procedure:   DCCV  Indication:  Symptomatic atrial fibrillation  Procedure Note:  The patient signed informed consent.  They have had had therapeutic anticoagulation with rivaroxaban greater than 3 weeks.  Anesthesia was administered by Dr. Hyacinth Meeker.  Patient received 80 mg IV lidocaine and 80 mg IV propofol.Adequate airway was maintained throughout and vital followed per protocol.  They were cardioverted x 1 with 200J of biphasic synchronized energy.  They converted to NSR with PACs.  There were no apparent complications.  The patient had normal neuro status and respiratory status post procedure with vitals stable as recorded elsewhere.    Follow up:  They will continue on current medical therapy and follow up with cardiology as scheduled.  Jodelle Red, MD PhD 01/15/2023 8:19 AM

## 2023-01-15 NOTE — Interval H&P Note (Signed)
History and Physical Interval Note:  01/15/2023 7:50 AM  Jonathon Stevens  has presented today for surgery, with the diagnosis of atrial fibrillation and atrial flutter.  The various methods of treatment have been discussed with the patient and family. After consideration of risks, benefits and other options for treatment, the patient has consented to  Procedure(s): CARDIOVERSION (N/A) as a surgical intervention.  The patient's history has been reviewed, patient examined, no change in status, stable for surgery.  I have reviewed the patient's chart and labs.  Questions were answered to the patient's satisfaction.     Shir Bergman Cristal Deer

## 2023-01-15 NOTE — Anesthesia Postprocedure Evaluation (Signed)
Anesthesia Post Note  Patient: Jonathon Stevens  Procedure(s) Performed: CARDIOVERSION     Patient location during evaluation: PACU Anesthesia Type: General Level of consciousness: awake and alert Pain management: pain level controlled Vital Signs Assessment: post-procedure vital signs reviewed and stable Respiratory status: spontaneous breathing, nonlabored ventilation and respiratory function stable Cardiovascular status: blood pressure returned to baseline and stable Postop Assessment: no apparent nausea or vomiting Anesthetic complications: no   No notable events documented.  Last Vitals:  Vitals:   01/15/23 0853 01/15/23 0855  BP:    Pulse: (!) 55 (!) 53  Resp: 11 10  Temp:    SpO2: 96% 98%    Last Pain:  Vitals:   01/15/23 0853  TempSrc:   PainSc: 0-No pain                 Lowella Curb

## 2023-01-16 ENCOUNTER — Encounter (HOSPITAL_COMMUNITY): Payer: Self-pay | Admitting: Cardiology

## 2023-01-22 ENCOUNTER — Other Ambulatory Visit: Payer: Self-pay

## 2023-01-22 ENCOUNTER — Telehealth: Payer: Self-pay | Admitting: Cardiovascular Disease

## 2023-01-22 ENCOUNTER — Telehealth: Payer: Self-pay

## 2023-01-22 ENCOUNTER — Other Ambulatory Visit (HOSPITAL_COMMUNITY): Payer: Self-pay

## 2023-01-22 DIAGNOSIS — I4819 Other persistent atrial fibrillation: Secondary | ICD-10-CM

## 2023-01-22 MED ORDER — METOPROLOL TARTRATE 50 MG PO TABS
50.0000 mg | ORAL_TABLET | Freq: Two times a day (BID) | ORAL | 3 refills | Status: DC
  Filled 2023-01-22: qty 180, 90d supply, fill #0

## 2023-01-22 NOTE — Telephone Encounter (Signed)
Called pt and offered him a sooner date for Ablation. His procedure has been moved from 05/22/23 to 01/30/23 at 12:30 PM.  He will go to [facility] tomorrow 10/29 for labs.   Per Dr. Nelly Laurence he will do a TEE on the table prior to the procedure instead of a CT Scan.   Message sent to Daun Peacock to arrange that.  Instruction letter will be sent via MyChart and a copy mailed to pt per his request.

## 2023-01-22 NOTE — Telephone Encounter (Signed)
Spoke with patient, back into AF according to watch. Patient heart rate ranges from 95-120 with using metoprolol 25 mg twice daily as needed. Patient is asymptomatic and blood pressure tolerating metoprolol well. Per Dr Nelly Laurence in clinic, patient can discontinue his Tikosyn and increase metoprolol to 50 mg twice daily. Ablation moved to 01/30/23. No further questions at this time

## 2023-01-22 NOTE — Telephone Encounter (Signed)
STAT if HR is under 50 or over 120 (normal HR is 60-100 beats per minute)  What is your heart rate? 108  Do you have a log of your heart rate readings (document readings)? HR ranging from 95-120, cannot get it lower than 95.  Do you have any other symptoms? A couple of days ago it spiked up to 135. States that he cannot feel it and denies any symptoms. His phone states that he is in afib 39% of the time.

## 2023-01-24 LAB — CBC
Hematocrit: 45.6 % (ref 37.5–51.0)
Hemoglobin: 15.1 g/dL (ref 13.0–17.7)
MCH: 29.2 pg (ref 26.6–33.0)
MCHC: 33.1 g/dL (ref 31.5–35.7)
MCV: 88 fL (ref 79–97)
Platelets: 182 10*3/uL (ref 150–450)
RBC: 5.17 x10E6/uL (ref 4.14–5.80)
RDW: 13.7 % (ref 11.6–15.4)
WBC: 8.6 10*3/uL (ref 3.4–10.8)

## 2023-01-24 LAB — BASIC METABOLIC PANEL
BUN/Creatinine Ratio: 9 — ABNORMAL LOW (ref 10–24)
BUN: 14 mg/dL (ref 8–27)
CO2: 18 mmol/L — ABNORMAL LOW (ref 20–29)
Calcium: 9.7 mg/dL (ref 8.6–10.2)
Chloride: 102 mmol/L (ref 96–106)
Creatinine, Ser: 1.56 mg/dL — ABNORMAL HIGH (ref 0.76–1.27)
Glucose: 151 mg/dL — ABNORMAL HIGH (ref 70–99)
Potassium: 5.2 mmol/L (ref 3.5–5.2)
Sodium: 140 mmol/L (ref 134–144)
eGFR: 45 mL/min/{1.73_m2} — ABNORMAL LOW (ref >59)

## 2023-01-29 NOTE — Pre-Procedure Instructions (Signed)
Instructed patient on the following items: Arrival time 1000 Nothing to eat or drink after midnight No meds AM of procedure Responsible person to drive you home and stay with you for 24 hrs  Have you missed any doses of anti-coagulant Xarelto- takes once a day, hasn't missed any doses.

## 2023-01-30 ENCOUNTER — Ambulatory Visit (HOSPITAL_COMMUNITY): Payer: Medicare Other | Admitting: Anesthesiology

## 2023-01-30 ENCOUNTER — Ambulatory Visit (HOSPITAL_BASED_OUTPATIENT_CLINIC_OR_DEPARTMENT_OTHER): Payer: Medicare Other | Admitting: Anesthesiology

## 2023-01-30 ENCOUNTER — Encounter (HOSPITAL_COMMUNITY)
Admission: RE | Disposition: A | Discharge status: Home / Self Care | Payer: Self-pay | Source: Home / Self Care | Attending: Cardiovascular Disease

## 2023-01-30 ENCOUNTER — Ambulatory Visit (HOSPITAL_COMMUNITY)
Admission: RE | Admit: 2023-01-30 | Discharge: 2023-01-30 | Disposition: A | Discharge status: Home / Self Care | Payer: Medicare Other | Attending: Cardiovascular Disease | Admitting: Cardiovascular Disease

## 2023-01-30 ENCOUNTER — Other Ambulatory Visit: Payer: Self-pay

## 2023-01-30 ENCOUNTER — Ambulatory Visit (HOSPITAL_COMMUNITY): Payer: Medicare Other

## 2023-01-30 DIAGNOSIS — J449 Chronic obstructive pulmonary disease, unspecified: Secondary | ICD-10-CM | POA: Diagnosis not present

## 2023-01-30 DIAGNOSIS — Z87891 Personal history of nicotine dependence: Secondary | ICD-10-CM | POA: Insufficient documentation

## 2023-01-30 DIAGNOSIS — Z7901 Long term (current) use of anticoagulants: Secondary | ICD-10-CM | POA: Diagnosis not present

## 2023-01-30 DIAGNOSIS — I340 Nonrheumatic mitral (valve) insufficiency: Secondary | ICD-10-CM

## 2023-01-30 DIAGNOSIS — I483 Typical atrial flutter: Secondary | ICD-10-CM | POA: Diagnosis not present

## 2023-01-30 DIAGNOSIS — Z8673 Personal history of transient ischemic attack (TIA), and cerebral infarction without residual deficits: Secondary | ICD-10-CM | POA: Diagnosis not present

## 2023-01-30 DIAGNOSIS — K219 Gastro-esophageal reflux disease without esophagitis: Secondary | ICD-10-CM | POA: Insufficient documentation

## 2023-01-30 DIAGNOSIS — E119 Type 2 diabetes mellitus without complications: Secondary | ICD-10-CM | POA: Diagnosis not present

## 2023-01-30 DIAGNOSIS — I484 Atypical atrial flutter: Secondary | ICD-10-CM

## 2023-01-30 DIAGNOSIS — I4819 Other persistent atrial fibrillation: Secondary | ICD-10-CM

## 2023-01-30 DIAGNOSIS — I10 Essential (primary) hypertension: Secondary | ICD-10-CM | POA: Diagnosis not present

## 2023-01-30 DIAGNOSIS — I700 Atherosclerosis of aorta: Secondary | ICD-10-CM | POA: Diagnosis not present

## 2023-01-30 DIAGNOSIS — Z79899 Other long term (current) drug therapy: Secondary | ICD-10-CM | POA: Insufficient documentation

## 2023-01-30 HISTORY — PX: ATRIAL FIBRILLATION ABLATION: EP1191

## 2023-01-30 LAB — ECHO TEE
Height: 70 in
Weight: 2960 [oz_av]

## 2023-01-30 LAB — GLUCOSE, CAPILLARY: Glucose-Capillary: 156 mg/dL — ABNORMAL HIGH (ref 70–99)

## 2023-01-30 LAB — POCT ACTIVATED CLOTTING TIME
Activated Clotting Time: 354 s
Activated Clotting Time: 279 s
Activated Clotting Time: 325 s

## 2023-01-30 SURGERY — ATRIAL FIBRILLATION ABLATION
Anesthesia: General

## 2023-01-30 MED ORDER — ACETAMINOPHEN 10 MG/ML IV SOLN: 1000.0000 mg | Freq: Once | INTRAVENOUS | Status: DC | PRN

## 2023-01-30 MED ORDER — SODIUM CHLORIDE 0.9% FLUSH: 3.0000 mL | INTRAVENOUS | Status: DC | PRN

## 2023-01-30 MED ORDER — DEXAMETHASONE SODIUM PHOSPHATE 10 MG/ML IJ SOLN
INTRAMUSCULAR | Status: DC | PRN
  Administered 2023-01-30: 10 mg via INTRAVENOUS

## 2023-01-30 MED ORDER — ONDANSETRON HCL 4 MG/2ML IJ SOLN
INTRAMUSCULAR | Status: DC | PRN
  Administered 2023-01-30: 4 mg via INTRAVENOUS

## 2023-01-30 MED ORDER — HEPARIN (PORCINE) IN NACL 1000-0.9 UT/500ML-% IV SOLN
INTRAVENOUS | Status: DC | PRN
  Administered 2023-01-30 (×3): 500 mL

## 2023-01-30 MED ORDER — SODIUM CHLORIDE 0.9 % IV SOLN: 250.0000 mL | INTRAVENOUS | Status: DC | PRN

## 2023-01-30 MED ORDER — FENTANYL CITRATE (PF) 100 MCG/2ML IJ SOLN: 25.0000 ug | INTRAMUSCULAR | Status: DC | PRN

## 2023-01-30 MED ORDER — LIDOCAINE 2% (20 MG/ML) 5 ML SYRINGE
INTRAMUSCULAR | Status: DC | PRN
  Administered 2023-01-30: 80 mg via INTRAVENOUS

## 2023-01-30 MED ORDER — SODIUM CHLORIDE 0.9 % IV SOLN: INTRAVENOUS | Status: DC

## 2023-01-30 MED ORDER — ATROPINE SULFATE 1 MG/10ML IJ SOSY
PREFILLED_SYRINGE | INTRAMUSCULAR | Status: DC | PRN
  Administered 2023-01-30: 1 mg via INTRAVENOUS

## 2023-01-30 MED ORDER — FENTANYL CITRATE (PF) 250 MCG/5ML IJ SOLN
INTRAMUSCULAR | Status: DC | PRN
  Administered 2023-01-30: 100 ug via INTRAVENOUS

## 2023-01-30 MED ORDER — HEPARIN SODIUM (PORCINE) 1000 UNIT/ML IJ SOLN
INTRAMUSCULAR | Status: DC | PRN
  Administered 2023-01-30: 2000 [IU] via INTRAVENOUS
  Administered 2023-01-30: 16000 [IU] via INTRAVENOUS

## 2023-01-30 MED ORDER — PHENYLEPHRINE HCL-NACL 20-0.9 MG/250ML-% IV SOLN
INTRAVENOUS | Status: DC | PRN
  Administered 2023-01-30: 40 ug/min via INTRAVENOUS

## 2023-01-30 MED ORDER — ROCURONIUM BROMIDE 10 MG/ML (PF) SYRINGE
PREFILLED_SYRINGE | INTRAVENOUS | Status: DC | PRN
  Administered 2023-01-30 (×2): 20 mg via INTRAVENOUS
  Administered 2023-01-30: 60 mg via INTRAVENOUS
  Administered 2023-01-30: 20 mg via INTRAVENOUS

## 2023-01-30 MED ORDER — PROPOFOL 500 MG/50ML IV EMUL
INTRAVENOUS | Status: DC | PRN
  Administered 2023-01-30: 55 ug/kg/min via INTRAVENOUS

## 2023-01-30 MED ORDER — ONDANSETRON HCL 4 MG/2ML IJ SOLN: 4.0000 mg | Freq: Four times a day (QID) | INTRAMUSCULAR | Status: DC | PRN

## 2023-01-30 MED ORDER — PHENYLEPHRINE 80 MCG/ML (10ML) SYRINGE FOR IV PUSH (FOR BLOOD PRESSURE SUPPORT)
PREFILLED_SYRINGE | INTRAVENOUS | Status: DC | PRN
  Administered 2023-01-30: 80 ug via INTRAVENOUS

## 2023-01-30 MED ORDER — SODIUM CHLORIDE 0.9% FLUSH
3.0000 mL | Freq: Two times a day (BID) | INTRAVENOUS | Status: DC
Start: 2023-01-30 — End: 2023-01-31

## 2023-01-30 MED ORDER — ACETAMINOPHEN 325 MG PO TABS: 650.0000 mg | ORAL_TABLET | ORAL | Status: DC | PRN

## 2023-01-30 MED ORDER — SUGAMMADEX SODIUM 200 MG/2ML IV SOLN
INTRAVENOUS | Status: DC | PRN
  Administered 2023-01-30: 200 mg via INTRAVENOUS

## 2023-01-30 MED ORDER — PROPOFOL 10 MG/ML IV BOLUS
INTRAVENOUS | Status: DC | PRN
  Administered 2023-01-30: 160 mg via INTRAVENOUS

## 2023-01-30 MED ORDER — SODIUM CHLORIDE 0.9 % IV BOLUS
1000.0000 mL | Freq: Once | INTRAVENOUS | Status: AC
  Administered 2023-01-30: 1000 mL via INTRAVENOUS

## 2023-01-30 SURGICAL SUPPLY — 20 items
BAG SNAP BAND KOVER 36X36 (MISCELLANEOUS) ×1
CABLE PFA RX CATH CONN (CABLE) ×1
CATH FARAWAVE ABLATION 31 (CATHETERS) ×1
CATH OCTARAY 2.0 F 3-3-3-3-3 (CATHETERS) ×1
CATH SOUNDSTAR ECO 8FR (CATHETERS) ×1
CATH WEBSTER BI DIR CS D-F CRV (CATHETERS) ×1
CLOSURE PERCLOSE PROSTYLE (VASCULAR PRODUCTS) ×2
COVER SWIFTLINK CONNECTOR (BAG) ×1
DEVICE CLOSURE MYNXGRIP 6/7F (Vascular Products) ×2 IMPLANT
DILATOR VESSEL 38 20CM 16FR (INTRODUCER) ×1
INQWIRE 1.5J .035X260CM (WIRE) ×1
PACK EP LATEX FREE (CUSTOM PROCEDURE TRAY) ×1
PACK EP LF (CUSTOM PROCEDURE TRAY) ×1
PAD DEFIB RADIO PHYSIO CONN (PAD) ×1
PATCH CARTO3 (PAD) ×1
SHEATH FARADRIVE STEERABLE (SHEATH) ×1
SHEATH PINNACLE 8F 10CM (SHEATH) ×1
SHEATH PINNACLE 9F 10CM (SHEATH) ×2
SHEATH PROBE COVER 6X72 (BAG) ×1
SHEATH WIRE KIT BAYLIS SL1 (KITS) ×1

## 2023-01-30 NOTE — Discharge Instructions (Signed)

## 2023-01-30 NOTE — Interval H&P Note (Signed)
History and Physical Interval Note:  01/30/2023 9:36 AM  Jonathon Stevens  has presented today for surgery, with the diagnosis of afib.  The various methods of treatment have been discussed with the patient and family. After consideration of risks, benefits and other options for treatment, the patient has consented to  Procedure(s): ATRIAL FIBRILLATION ABLATION (N/A) TRANSESOPHAGEAL ECHOCARDIOGRAM (N/A) as a surgical intervention.  The patient's history has been reviewed, patient examined, no change in status, stable for surgery.  I have reviewed the patient's chart and labs.  Questions were answered to the patient's satisfaction.     Roberts Gaudy Jamaia Brum

## 2023-01-30 NOTE — Progress Notes (Signed)
Patient and wife was given discharge instructions. Both verbalized understanding. 

## 2023-01-30 NOTE — Progress Notes (Signed)
Patient walked to the bathroom without difficulties and was able to urinate. Bilateral groin sites level 0, clean, dry, and intact.

## 2023-01-30 NOTE — Progress Notes (Signed)
1700 Patient unable to urinate after ambulation. Dr. Nelly Laurence informed. Encouraged po fluids and NS started for a 250 cc bolus.

## 2023-01-30 NOTE — Anesthesia Preprocedure Evaluation (Addendum)
Anesthesia Evaluation  Patient identified by MRN, date of birth, ID band Patient awake    Reviewed: Allergy & Precautions, NPO status , Patient's Chart, lab work & pertinent test results  Airway Mallampati: II  TM Distance: >3 FB Neck ROM: Full    Dental no notable dental hx. (+) Edentulous Upper, Edentulous Lower   Pulmonary COPD, former smoker   Pulmonary exam normal        Cardiovascular hypertension, Pt. on medications + dysrhythmias Atrial Fibrillation  Rhythm:Irregular Rate:Normal     Neuro/Psych CVA  negative psych ROS   GI/Hepatic Neg liver ROS,GERD  Medicated,,  Endo/Other  diabetes    Renal/GU negative Renal ROS  negative genitourinary   Musculoskeletal negative musculoskeletal ROS (+)    Abdominal Normal abdominal exam  (+)   Peds  Hematology  (+) Blood dyscrasia, anemia   Anesthesia Other Findings   Reproductive/Obstetrics                             Anesthesia Physical Anesthesia Plan  ASA: 3  Anesthesia Plan: General   Post-op Pain Management:    Induction: Intravenous  PONV Risk Score and Plan: 2 and Ondansetron, Dexamethasone, Propofol infusion, Treatment may vary due to age or medical condition and TIVA  Airway Management Planned: Mask and Oral ETT  Additional Equipment: None  Intra-op Plan:   Post-operative Plan: Extubation in OR  Informed Consent: I have reviewed the patients History and Physical, chart, labs and discussed the procedure including the risks, benefits and alternatives for the proposed anesthesia with the patient or authorized representative who has indicated his/her understanding and acceptance.     Dental advisory given  Plan Discussed with: CRNA  Anesthesia Plan Comments:        Anesthesia Quick Evaluation

## 2023-01-30 NOTE — Anesthesia Procedure Notes (Signed)
Procedure Name: Intubation Date/Time: 01/30/2023 10:02 AM  Performed by: Vena Austria, CRNAPre-anesthesia Checklist: Patient identified, Emergency Drugs available, Suction available, Patient being monitored and Timeout performed Patient Re-evaluated:Patient Re-evaluated prior to induction Oxygen Delivery Method: Circle system utilized Preoxygenation: Pre-oxygenation with 100% oxygen Induction Type: IV induction Ventilation: Mask ventilation without difficulty Laryngoscope Size: McGraph and 3 Grade View: Grade I Tube type: Oral Tube size: 7.0 mm Number of attempts: 1 Airway Equipment and Method: Stylet Placement Confirmation: ETT inserted through vocal cords under direct vision, positive ETCO2, CO2 detector and breath sounds checked- equal and bilateral Secured at: 22 cm Tube secured with: Tape Dental Injury: Teeth and Oropharynx as per pre-operative assessment

## 2023-01-30 NOTE — Transfer of Care (Signed)
Immediate Anesthesia Transfer of Care Note  Patient: Jonathon Stevens  Procedure(s) Performed: ATRIAL FIBRILLATION ABLATION  Patient Location: PACU and Cath Lab  Anesthesia Type:General  Level of Consciousness: awake  Airway & Oxygen Therapy: Patient Spontanous Breathing and Patient connected to nasal cannula oxygen  Post-op Assessment: Report given to RN  Post vital signs: stable  Last Vitals:  Vitals Value Taken Time  BP 109/66 01/30/23 1305  Temp 36.6 C 01/30/23 1300  Pulse 66 01/30/23 1306  Resp 18 01/30/23 1306  SpO2 97 % 01/30/23 1306  Vitals shown include unfiled device data.  Last Pain:  Vitals:   01/30/23 1300  TempSrc: Oral         Complications: No notable events documented.

## 2023-01-31 ENCOUNTER — Encounter (HOSPITAL_COMMUNITY): Payer: Self-pay | Admitting: Cardiovascular Disease

## 2023-01-31 ENCOUNTER — Telehealth: Payer: Self-pay | Admitting: Cardiovascular Disease

## 2023-01-31 NOTE — Telephone Encounter (Signed)
Spoke with patient, states he didn't receive his discharge paperwork after ablation yesterday but made patient aware he can access this thru his MyChart. Patient states someone from short stay was also going to mail him a copy. No needs at this time

## 2023-01-31 NOTE — Anesthesia Postprocedure Evaluation (Signed)
Anesthesia Post Note  Patient: Jonathon Stevens  Procedure(s) Performed: ATRIAL FIBRILLATION ABLATION     Patient location during evaluation: PACU Anesthesia Type: General Level of consciousness: awake and alert Pain management: pain level controlled Vital Signs Assessment: post-procedure vital signs reviewed and stable Respiratory status: spontaneous breathing, nonlabored ventilation, respiratory function stable and patient connected to nasal cannula oxygen Cardiovascular status: blood pressure returned to baseline and stable Postop Assessment: no apparent nausea or vomiting Anesthetic complications: no   No notable events documented.  Last Vitals:  Vitals:   01/30/23 1815 01/30/23 1830  BP: (!) 116/58 (!) 125/52  Pulse: 64 70  Resp: 14 11  Temp:    SpO2: 96% (!) 88%    Last Pain:  Vitals:   01/30/23 1745  TempSrc:   PainSc: 0-No pain                 Earl Lites P Timm Bonenberger

## 2023-01-31 NOTE — Telephone Encounter (Signed)
Pt states he did not get his dismissal forms. Please advise

## 2023-02-26 ENCOUNTER — Ambulatory Visit (HOSPITAL_COMMUNITY)
Admission: RE | Admit: 2023-02-26 | Discharge: 2023-02-26 | Disposition: A | Discharge status: Home / Self Care | Payer: Medicare Other | Source: Ambulatory Visit | Attending: Physician Assistant | Admitting: Physician Assistant

## 2023-02-26 ENCOUNTER — Encounter (HOSPITAL_COMMUNITY): Payer: Self-pay | Admitting: Physician Assistant

## 2023-02-26 VITALS — BP 158/76 | HR 60 | Ht 70.0 in | Wt 189.0 lb

## 2023-02-26 DIAGNOSIS — D6869 Other thrombophilia: Secondary | ICD-10-CM | POA: Insufficient documentation

## 2023-02-26 DIAGNOSIS — J449 Chronic obstructive pulmonary disease, unspecified: Secondary | ICD-10-CM | POA: Diagnosis not present

## 2023-02-26 DIAGNOSIS — Z79899 Other long term (current) drug therapy: Secondary | ICD-10-CM | POA: Diagnosis not present

## 2023-02-26 DIAGNOSIS — Z7901 Long term (current) use of anticoagulants: Secondary | ICD-10-CM | POA: Diagnosis not present

## 2023-02-26 DIAGNOSIS — E785 Hyperlipidemia, unspecified: Secondary | ICD-10-CM | POA: Insufficient documentation

## 2023-02-26 DIAGNOSIS — I2510 Atherosclerotic heart disease of native coronary artery without angina pectoris: Secondary | ICD-10-CM | POA: Diagnosis not present

## 2023-02-26 DIAGNOSIS — Z8616 Personal history of COVID-19: Secondary | ICD-10-CM | POA: Insufficient documentation

## 2023-02-26 DIAGNOSIS — N189 Chronic kidney disease, unspecified: Secondary | ICD-10-CM | POA: Insufficient documentation

## 2023-02-26 DIAGNOSIS — I4892 Unspecified atrial flutter: Secondary | ICD-10-CM | POA: Insufficient documentation

## 2023-02-26 DIAGNOSIS — I4819 Other persistent atrial fibrillation: Secondary | ICD-10-CM | POA: Diagnosis not present

## 2023-02-26 DIAGNOSIS — I129 Hypertensive chronic kidney disease with stage 1 through stage 4 chronic kidney disease, or unspecified chronic kidney disease: Secondary | ICD-10-CM | POA: Insufficient documentation

## 2023-02-26 MED ORDER — METOPROLOL TARTRATE 25 MG PO TABS: 25.0000 mg | ORAL_TABLET | Freq: Two times a day (BID) | ORAL | 3 refills | Status: DC

## 2023-02-26 NOTE — Addendum Note (Signed)
Encounter addended by: Danice Goltz, PA on: 02/26/2023 4:21 PM  Actions taken: Clinical Note Signed

## 2023-02-26 NOTE — Progress Notes (Addendum)
Primary Care Physician: Barbie Banner, MD Primary Cardiologist: Dr Eldridge Dace  Primary Electrophysiologist: Dr Nelly Laurence Referring Physician: Dr Ladona Ridgel   Jonathon Stevens is a 77 y.o. male with a history of CVA, AAA, right common iliac aneurysm, HLD, pulmonary fibrosis, COPD, CKD, esophageal bleed s/p clipping, atrial flutter, atrial fibrillation who presents for follow up in the [facility]. Patient is on Xarelto for a CHADS2VASC score of 6. He wore a cardiac monitor which showed 100% afib burden but also did have VT (22 beats) and pauses up to 10.5 seconds. He was seen by Dr Ladona Ridgel 09/23/21 who recommended dofetilide.   Patient is s/p dofetilide loading 7/18-7/21/23 with DCCV on 10/13/21. He was in rate controlled atrial flutter on follow up but was symptomatically improved. Patient has continued to go in and out of rhythm since starting dofetilide. He has been set up for DCCV twice but converted to SR prior to each. Patient is s/p Afib and atypical atrial flutter ablation by Dr. Nelly Laurence on 09/06/22. Unfortunately, he had recurrence of his atrial arrhythmias and underwent repeat afib and typical flutter ablation on 01/30/23  On follow up today, patient reports that his afib burden on his watch has dropped from 60% to 20% post ablation. He denies any chest pain or groin issues. No bleeding issues on anticoagulation.   Today, he denies symptoms of palpitations, chest pain, SOB, orthopnea, PND, lower extremity edema, dizziness, presyncope, syncope, snoring, daytime somnolence, bleeding, or neurologic sequela. The patient is tolerating medications without difficulties and is otherwise without complaint today.    Atrial Fibrillation Risk Factors:  he does not have symptoms or diagnosis of sleep apnea. he does not have a history of rheumatic fever.   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 ECHO 10/08/13: EF 45-50%, mild hypokinesis of apical myocardium. Mild LA dilation, mild RA dilation;  b.  TEE (10/09/13):  EF 50-55%, normal wall motion, no LAA clot   Hypertension    Pneumonia    Stroke [facility])     Current Outpatient Medications  Medication Sig Dispense Refill   allopurinol (ZYLOPRIM) 300 MG tablet Take 150 mg by mouth daily after breakfast.     atorvastatin (LIPITOR) 10 MG tablet TAKE 1 TABLET(10 MG) BY MOUTH DAILY 90 tablet 3   CALCIUM PO Take 1 tablet by mouth daily.     Cholecalciferol (VITAMIN D3) 125 MCG (5000 UT) CAPS Take 5,000 Units by mouth daily.     Coenzyme Q10 (COQ-10) 100 MG CAPS Take 100 mg by mouth daily.     GLUCOSAMINE-CHONDROITIN PO Take 1 tablet by mouth daily.      Magnesium 250 MG TABS Take 250 mg by mouth daily.     metoprolol tartrate (LOPRESSOR) 50 MG tablet Take 1 tablet (50 mg total) by mouth 2 (two) times daily. 180 tablet 3   Multiple Vitamin (MULTIVITAMIN) tablet Take 1 tablet by mouth daily.     pantoprazole (PROTONIX) 40 MG tablet Take 1 tablet (40 mg total) by mouth daily. 30 tablet  1   rivaroxaban (XARELTO) 20 MG TABS tablet Take 1 tablet (20 mg total) by mouth daily with supper. 90 tablet 1   blood glucose meter kit and supplies KIT Dispense based on patient and insurance preference. Use up to four times daily as directed. (FOR ICD-9 250.00, 250.01). 1 each 0   No current facility-administered medications for this encounter.    ROS- All systems are reviewed and negative except as per the HPI above.  Physical Exam: Vitals:   02/26/23 1511  Height: 5\' 10"  (1.778 m)     GEN: Well nourished, well developed in no acute distress NECK: No JVD; No carotid bruits CARDIAC: Regular rate and rhythm with occasional ectopy, no murmurs, rubs, gallops RESPIRATORY:  Clear to auscultation without rales, wheezing or rhonchi  ABDOMEN: Soft, non-tender, non-distended EXTREMITIES:  No edema; No deformity    Wt Readings from Last 3 Encounters:  01/30/23 83.9 kg  01/15/23 83.7 kg  01/12/23 83.7 kg    EKG today demonstrates  SR, 1st degree AV block, PVC Vent. rate 60 BPM PR interval 246 ms QRS duration 96 ms QT/QTcB 438/438 ms   Echo 02/13/21 demonstrated   1. Left ventricular ejection fraction, by estimation, is 70 to 75%. The  left ventricle has hyperdynamic function. The left ventricle has no  regional wall motion abnormalities. There is mild left ventricular  hypertrophy. Left ventricular diastolic function could not be evaluated.   2. Right ventricular systolic function is normal. The right ventricular  size is normal.   3. Left atrial size was moderately dilated.   4. The mitral valve is normal in structure. Mild mitral valve  regurgitation. No evidence of mitral stenosis.   5. The aortic valve is tricuspid. Aortic valve regurgitation is not  visualized. Aortic valve sclerosis is present, with no evidence of aortic valve stenosis.   6. The inferior vena cava is normal in size with greater than 50%  respiratory variability, suggesting right atrial pressure of 3 mmHg.   Epic records are reviewed at length today  CHA2DS2-VASc Score = 7  The patient's score is based upon: CHF History: 0 HTN History: 1 Diabetes History: 1 Stroke History: 2 Vascular Disease History: 1 Age Score: 2 Gender Score: 0       ASSESSMENT AND PLAN: Persistent Atrial Fibrillation/atrial flutter The patient's CHA2DS2-VASc score is 7, indicating a 11.2% annual risk of stroke.   Dofetilide discontinued  S/p Afib and atypical atrial flutter ablation 09/06/22 and repeat afib  and typical flutter ablation 01/30/23. Burden dropped from 60% to 20% on his smart watch. Hopefully his burden will continue to trend down as he heals from the ablation.  Continue Lopressor 25 mg BID (patient self decreased due to bradycardia) Continue Xarelto 20 mg daily with no missed doses for 3 months post ablation.   Secondary Hypercoagulable State (ICD10:  D68.69) The patient is at significant risk for stroke/thromboembolism based upon his CHA2DS2-VASc Score of 7.  Continue Rivaroxaban (Xarelto).   HTN Mildly elevated today, has been better controlled at previous visit. Continue to monitor, no changes today.  CAD On statin No anginal symptoms   Follow up with Francis Dowse as scheduled.    Jorja Loa PA-C [facility] [address] [place], [place] [address] [phone] 02/26/2023 3:35 PM

## 2023-04-23 ENCOUNTER — Ambulatory Visit: Payer: Medicare Other | Admitting: Cardiovascular Disease

## 2023-04-27 ENCOUNTER — Other Ambulatory Visit (HOSPITAL_COMMUNITY): Payer: Medicare Other

## 2023-05-03 ENCOUNTER — Ambulatory Visit: Payer: Medicare Other | Admitting: Physician Assistant

## 2023-05-16 ENCOUNTER — Other Ambulatory Visit: Payer: Self-pay | Admitting: Internal Medicine

## 2023-05-16 DIAGNOSIS — I480 Paroxysmal atrial fibrillation: Secondary | ICD-10-CM

## 2023-05-16 NOTE — Telephone Encounter (Signed)
Prescription refill request for Xarelto received.  Indication: Afib  Last office visit: 02/26/23 Charlean Merl)  Weight: 85.7kg Age: 78 Scr: 1.56 (01/23/23)  CrCl: 48.32ml/min  Per dosing criteria, current dose not appropriate. Will forward to pharmD team to review.

## 2023-05-17 NOTE — Telephone Encounter (Signed)
Per Laural Golden, PharmD, pt should remain on 20mg  daily at this time. Refill sent.

## 2023-05-24 NOTE — Progress Notes (Deleted)
  Electrophysiology Office Note:   Date:  05/24/2023  ID:  Karlin Heilman, DOB 24-May-1945, MRN [id number]  Primary Cardiologist: Lance Muss, MD Primary Heart Failure: None Electrophysiologist: Maurice Small, MD  {Click to update primary MD,subspecialty MD or APP then REFRESH:1}    History of Present Illness:   Jonathon Stevens is a 78 y.o. male with h/o AF, AFL, CVA, AAA s/p repair, HLD,  COPD, pulmonary fibrosis, CKD, GERD seen today for routine electrophysiology followup.   Since last being seen in our clinic the patient reports doing ***.  he denies chest pain, palpitations, dyspnea, PND, orthopnea, nausea, vomiting, dizziness, syncope, edema, weight gain, or early satiety.   Review of systems complete and found to be negative unless listed in HPI.   EP Information / Studies Reviewed:    {EKGtoday:28818}      Studies:  ECHO 01/2021 > LVEF 70-75%, mild LVH, LA moderately dilated    Arrhythmia / AAD AF Tikosyn > 09/2021 loading DCCV 09/2021 AF / atypical AFL ablation  DCCV 01/15/23 AF / AFL ablation 01/2023     Risk Assessment/Calculations:    CHA2DS2-VASc Score = 7  {Confirm score is correct.  If not, click here to update score.  REFRESH note.  :1} This indicates a 11.2% annual risk of stroke. The patient's score is based upon: CHF History: 0 HTN History: 1 Diabetes History: 1 Stroke History: 2 Vascular Disease History: 1 Age Score: 2 Gender Score: 0   {This patient has a significant risk of stroke if diagnosed with atrial fibrillation.  Please consider VKA or DOAC agent for anticoagulation if the bleeding risk is acceptable.   You can also use the SmartPhrase .[email or web address] for documentation.   :[id number]} No BP recorded.  {Refresh Note OR Click here to enter BP  :1}***        Physical Exam:   VS:  There were no vitals taken for this visit.   Wt Readings from Last 3 Encounters:  02/26/23 189 lb (85.7 kg)  01/30/23 185 lb (83.9 kg)  01/15/23 184 lb 8.4  oz (83.7 kg)     GEN: Well nourished, well developed in no acute distress NECK: No JVD; No carotid bruits CARDIAC: {EPRHYTHM:28826}, no murmurs, rubs, gallops RESPIRATORY:  Clear to auscultation without rales, wheezing or rhonchi  ABDOMEN: Soft, non-tender, non-distended EXTREMITIES:  No edema; No deformity   ASSESSMENT AND PLAN:    Persistent Atrial Fibrillation  Atrial Flutter CHA2DS2-VASc 7, s/p ablation & redo ablation  -continue lopressor 25mg  BID  -OAC for stroke prophylaxis  -monitors with ***  -symptom burden improved ***   Secondary Hypercoagulable State  -continue Xarelto, dose reviewed and appropriate by ***  Hypertension  -well controlled on current regimen ***  CAD  -statin per primary cardiology  -no anginal symptoms   Follow up with Dr. Nelly Laurence {EPFOLLOW KV:42595}  Signed, Canary Brim, NP-C, AGACNP-BC [facility] - Electrophysiology  05/24/2023, 8:55 PM

## 2023-05-29 ENCOUNTER — Ambulatory Visit: Payer: Medicare Other | Admitting: Pulmonary Disease

## 2023-05-29 DIAGNOSIS — I4892 Unspecified atrial flutter: Secondary | ICD-10-CM

## 2023-05-29 DIAGNOSIS — D6869 Other thrombophilia: Secondary | ICD-10-CM

## 2023-05-29 DIAGNOSIS — I480 Paroxysmal atrial fibrillation: Secondary | ICD-10-CM

## 2023-05-29 DIAGNOSIS — I4819 Other persistent atrial fibrillation: Secondary | ICD-10-CM

## 2023-06-13 NOTE — Progress Notes (Unsigned)
 Electrophysiology Office Note:   Date:  06/15/2023  ID:  Jonathon Stevens, DOB 10-May-1945, MRN [id number]  Primary Cardiologist: Lance Muss, MD Primary Heart Failure: None Electrophysiologist: Maurice Small, MD      History of Present Illness:   Jonathon Stevens is a 78 y.o. male with h/o AF, AFL, CVA, AAA s/p repair, HLD, COPD, pulmonary fibrosis, CKD, GERD seen today for routine electrophysiology followup.   Pt reports he just flew in from [place] as his son was in an auto accident.  He went there to help care for him.  He has been doing well overall.  Notes the cost of Xarelto is expensive and it keeps going up in his insurance Tier.  No known AF since his last ablation. He feels his energy levels are much better. He monitors with an Apple watch and notes he has irregular beats.  He often forgets to take his evening dose of metoprolol. Since last being seen in our clinic the patient reports doing well overall. He feels this ablation has been better compared to his first.  He denies chest pain, palpitations, dyspnea, PND, orthopnea, nausea, vomiting, dizziness, syncope, edema, weight gain, or early satiety.   Review of systems complete and found to be negative unless listed in HPI.   EP Information / Studies Reviewed:    EKG is ordered today. Personal review as below.  EKG Interpretation Date/Time:  Friday June 15 2023 08:09:04 EDT Ventricular Rate:  71 PR Interval:    QRS Duration:  86 QT Interval:  416 QTC Calculation: 452 R Axis:   9  Text Interpretation: Sinus rhythm with PAC / PVC's Confirmed by Canary Brim ([id number]) on 06/15/2023 9:54:26 AM   Studies:  ECHO 01/2021 > LVEF 70-75%, mild LVH, LA moderately dilated    Arrhythmia / AAD AF Tikosyn > 09/2021 loading DCCV 09/2021 AF / atypical AFL ablation  DCCV 01/15/23 AF / AFL ablation 01/2023     Risk Assessment/Calculations:    CHA2DS2-VASc Score = 7   This indicates a 11.2% annual risk of stroke. The patient's  score is based upon: CHF History: 0 HTN History: 1 Diabetes History: 1 Stroke History: 2 Vascular Disease History: 1 Age Score: 2 Gender Score: 0            Physical Exam:   VS:  BP (!) 142/68   Pulse 73   Ht 5\' 10"  (1.778 m)   Wt 184 lb 3.2 oz (83.6 kg)   SpO2 97%   BMI 26.43 kg/m    Wt Readings from Last 3 Encounters:  06/15/23 184 lb 3.2 oz (83.6 kg)  02/26/23 189 lb (85.7 kg)  01/30/23 185 lb (83.9 kg)     GEN: Well nourished, well developed in no acute distress NECK: No JVD; No carotid bruits CARDIAC: Regular rate and rhythm with frequent ectopy, no murmurs, rubs, gallops RESPIRATORY:  Clear to auscultation without rales, wheezing or rhonchi  ABDOMEN: Soft, non-tender, non-distended EXTREMITIES:  No edema; No deformity   ASSESSMENT AND PLAN:    Persistent Atrial Fibrillation  Atrial Flutter CHA2DS2-VASc 7, s/p ablation & redo ablation  -change from metoprolol 25mg  BID to Toprol 50 mg daily to ensure he gets total daily dose   -OAC for stroke prophylaxis  -monitors with an Apple Watch  -symptom burden improved overall    Secondary Hypercoagulable State  -continue Xarelto 20 mg daily -plan for repeat BMP at next visit for CrCl, may need to dose reduce Xarelto (historically 1.3 or less, most  recent was 1.5)  Hypertension  -mildly elevated in clinic, missed evening dose of metoprolol -followed with Dr. Eldridge Dace, refer to primary cardiology    CAD  -statin per primary cardiology  -no anginal symptoms   Follow up with Dr. Nelly Laurence or EP APP in 6 months  Signed, Canary Brim, NP-C, AGACNP-BC [facility] - Electrophysiology  06/15/2023, 10:00 AM

## 2023-06-15 ENCOUNTER — Encounter: Payer: Self-pay | Admitting: Pulmonary Disease

## 2023-06-15 ENCOUNTER — Ambulatory Visit: Attending: Pulmonary Disease | Admitting: Pulmonary Disease

## 2023-06-15 VITALS — BP 142/68 | HR 73 | Ht 70.0 in | Wt 184.2 lb

## 2023-06-15 DIAGNOSIS — I4819 Other persistent atrial fibrillation: Secondary | ICD-10-CM

## 2023-06-15 DIAGNOSIS — I4892 Unspecified atrial flutter: Secondary | ICD-10-CM | POA: Diagnosis not present

## 2023-06-15 DIAGNOSIS — D6869 Other thrombophilia: Secondary | ICD-10-CM | POA: Diagnosis not present

## 2023-06-15 MED ORDER — METOPROLOL SUCCINATE ER 50 MG PO TB24: 50.0000 mg | ORAL_TABLET | Freq: Every day | ORAL | 3 refills | Status: DC

## 2023-06-15 NOTE — Patient Instructions (Addendum)
 Medication Instructions:  STOP metoprolol tartrate START metoprolol succinate (Toprol XL) 50 mg daily *If you need a refill on your cardiac medications before your next appointment, please call your pharmacy*  Lab Work: None ordered If you have labs (blood work) drawn today and your tests are completely normal, you will receive your results only by: MyChart Message (if you have MyChart) OR A paper copy in the mail If you have any lab test that is abnormal or we need to change your treatment, we will call you to review the results.  Follow-Up: At [facility], you and your health needs are our priority.  As part of our continuing mission to provide you with exceptional heart care, we have created designated Provider Care Teams.  These Care Teams include your primary Cardiologist (physician) and Advanced Practice Providers (APPs -  Physician Assistants and Nurse Practitioners) who all work together to provide you with the care you need, when you need it.  Your next appointment:   6 month(s)  Provider:   York Pellant, MD or Canary Brim, NP    Your next appointment:   6 month(s)  Provider:   Dr Cristal Deer

## 2023-07-18 ENCOUNTER — Telehealth (HOSPITAL_BASED_OUTPATIENT_CLINIC_OR_DEPARTMENT_OTHER): Payer: Self-pay | Admitting: *Deleted

## 2023-07-18 NOTE — Telephone Encounter (Signed)
   Pre-operative Risk Assessment    Patient Name: Jonathon Stevens  DOB: 28-Nov-1945 MRN: [id number]   Date of last office visit: 06/15/2023 Date of next office visit: None  Request for Surgical Clearance    Procedure:  Colonoscopy  Date of Surgery:  Clearance 08/02/23                                 Surgeon:  Dr. Alvis Jourdain Surgeon's Group or Practice Name:  [facility] Phone number:  [phone] Fax number:  [phone]   Type of Clearance Requested:   - Medical  - Pharmacy:  Hold Rivaroxaban  (Xarelto ) Not Indicated  Type of Anesthesia:   Propofol    Additional requests/questions:    Signed, Lauris Port   07/18/2023, 1:20 PM

## 2023-07-18 NOTE — Telephone Encounter (Signed)
 Patient with diagnosis of afib on Xarelto  for anticoagulation.    Procedure: Colonoscopy  Date of procedure: 08/02/23   CHA2DS2-VASc Score = 7   This indicates a 11.2% annual risk of stroke. The patient's score is based upon: CHF History: 0 HTN History: 1 Diabetes History: 1 Stroke History: 2 Vascular Disease History: 1 Age Score: 2 Gender Score: 0      CrCl 46 ml/min Platelet count 182  Patient has not had an Afib/aflutter ablation within the last 3 months or DCCV within the last 30 days  Per office protocol, patient can hold Xarelto  for 2 days prior to procedure.    **This guidance is not considered finalized until pre-operative APP has relayed final recommendations.**

## 2023-07-24 ENCOUNTER — Telehealth: Payer: Self-pay

## 2023-07-24 NOTE — Telephone Encounter (Signed)
     Primary Cardiologist: Avery Bodo, MD  Chart reviewed as part of pre-operative protocol coverage. Given past medical history and time since last visit, based on ACC/AHA guidelines, Jonathon Stevens would be at acceptable risk for the planned procedure without further cardiovascular testing.   Patient with diagnosis of afib on Xarelto  for anticoagulation.     Procedure: Colonoscopy  Date of procedure: 08/02/23     CHA2DS2-VASc Score = 7   This indicates a 11.2% annual risk of stroke. The patient's score is based upon: CHF History: 0 HTN History: 1 Diabetes History: 1 Stroke History: 2 Vascular Disease History: 1 Age Score: 2 Gender Score: 0       CrCl 46 ml/min Platelet count 182   Patient has not had an Afib/aflutter ablation within the last 3 months or DCCV within the last 30 days   Per office protocol, patient can hold Xarelto  for 2 days prior to procedure.    I will route this recommendation to the requesting party via Epic fax function and remove from pre-op pool.  Please call with questions.  Jonathon Stevens. Jonathon Rockwell NP-C     07/24/2023, 7:56 AM [facility] [address] Office [phone] Fax [phone]

## 2023-07-24 NOTE — Telephone Encounter (Signed)
 Duplicate request.  Will remove from preoperative pool.  Chet Cota. Lavanna Rog NP-C     07/24/2023, 11:28 AM [facility] [address] Office [phone] Fax [phone]

## 2023-07-24 NOTE — Telephone Encounter (Signed)
   Pre-operative Risk Assessment    Patient Name: Jonathon Stevens  DOB: 09-Jul-1945 MRN: [id number]   Date of last office visit: 06/15/2023 Creighton Doffing, NP Date of next office visit: NONE   Request for Surgical Clearance    Procedure:   Colonoscopy  Date of Surgery:  Clearance 08/02/23                                Surgeon: Dr. Molli Angelucci. Jonathon Barn, MD Surgeon's Group or Practice Name: [facility] [place] Phone number: [phone] Fax number: [phone]   Type of Clearance Requested:   - Medical  - Pharmacy:  Hold Rivaroxaban  (Xarelto )     Type of Anesthesia:   Propofol    Additional requests/questions:    Tyrus Gallus   07/24/2023, 10:16 AM

## 2023-08-15 ENCOUNTER — Other Ambulatory Visit: Payer: Self-pay | Admitting: Cardiovascular Disease

## 2023-08-15 DIAGNOSIS — I480 Paroxysmal atrial fibrillation: Secondary | ICD-10-CM

## 2023-08-15 NOTE — Telephone Encounter (Signed)
 Pt last saw Creighton Doffing, NP on 06/15/23, last labs 01/23/23 Creat 1.56, age 78, weight 83.6kg, CrCl 46.15, based on CrCl pt is not on appropriate dosage of Xarelto .  Recommended dosage of Xarelto  is 15mg  every day, given CrCl 15-50.  Please advise if dosage change is appropriate.  Thanks

## 2023-08-19 ENCOUNTER — Encounter (HOSPITAL_BASED_OUTPATIENT_CLINIC_OR_DEPARTMENT_OTHER): Payer: Self-pay | Admitting: Pharmacist Clinician (PhC)/ Clinical Pharmacy Specialist

## 2023-08-19 DIAGNOSIS — I4819 Other persistent atrial fibrillation: Secondary | ICD-10-CM

## 2023-08-19 NOTE — Telephone Encounter (Signed)
 Labs ordered, pt will be at [facility] 5/28, sent MyChart asking him to go to lab.

## 2023-08-22 ENCOUNTER — Ambulatory Visit

## 2023-08-22 ENCOUNTER — Ambulatory Visit (HOSPITAL_COMMUNITY)
Admission: RE | Admit: 2023-08-22 | Discharge: 2023-08-22 | Disposition: A | Discharge status: Home / Self Care | Source: Ambulatory Visit | Attending: Vascular Surgery | Admitting: Vascular Surgery

## 2023-08-22 ENCOUNTER — Telehealth: Payer: Self-pay | Admitting: Pulmonary Disease

## 2023-08-22 VITALS — BP 119/70 | HR 67 | Temp 98.2°F | Ht 70.0 in | Wt 184.4 lb

## 2023-08-22 DIAGNOSIS — I7143 Infrarenal abdominal aortic aneurysm, without rupture: Secondary | ICD-10-CM | POA: Diagnosis not present

## 2023-08-22 DIAGNOSIS — I723 Aneurysm of iliac artery: Secondary | ICD-10-CM

## 2023-08-22 LAB — BASIC METABOLIC PANEL WITH GFR
BUN/Creatinine Ratio: 12 (ref 10–24)
BUN: 15 mg/dL (ref 8–27)
CO2: 21 mmol/L (ref 20–29)
Calcium: 10 mg/dL (ref 8.6–10.2)
Chloride: 102 mmol/L (ref 96–106)
Creatinine, Ser: 1.26 mg/dL (ref 0.76–1.27)
Glucose: 157 mg/dL — ABNORMAL HIGH (ref 70–99)
Potassium: 5 mmol/L (ref 3.5–5.2)
Sodium: 140 mmol/L (ref 134–144)
eGFR: 58 mL/min/{1.73_m2} — ABNORMAL LOW (ref >59)

## 2023-08-22 NOTE — Telephone Encounter (Signed)
 Pt c/o medication issue:  1. Name of Medication: metoprolol  succinate (TOPROL -XL) 50 MG 24 hr tablet   2. How are you currently taking this medication (dosage and times per day)? As written  3. Are you having a reaction (difficulty breathing--STAT)? No  4. What is your medication issue? Pt states his HR has been higher since he's been on this medication. HR: 126

## 2023-08-22 NOTE — Progress Notes (Signed)
 Office Note     CC:  follow up Requesting Provider:  Tura Gaines, MD  HPI: Jonathon Stevens is a 78 y.o. 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 Vikki Graves on 07/18/2022.  He denies any new or changing abdominal or back pain.  He is having ongoing frustration related to atrial fibrillation after 2 different ablation procedures last year.  He has a follow-up with his cardiologist on Monday.  He denies any claudication, rest pain, or tissue loss of bilateral lower extremities.  He is on Xarelto  daily.  He is also on a daily statin.   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 ECHO 10/08/13: EF 45-50%, mild hypokinesis of apical myocardium. Mild LA dilation, mild RA dilation;  b.  TEE (10/09/13):  EF 50-55%, normal wall motion, no LAA clot   Hypertension    Pneumonia    Stroke [facility])     Past Surgical History:  Procedure Laterality Date   ABDOMINAL AORTIC ENDOVASCULAR STENT GRAFT N/A 07/18/2022   Procedure: ABDOMINAL AORTIC ENDOVASCULAR STENT GRAFT;  Surgeon: Adine Hoof, MD;  Location: [facility] OR;  Service: Vascular;  Laterality: N/A;   ATRIAL FIBRILLATION ABLATION N/A 09/06/2022   Procedure: ATRIAL FIBRILLATION ABLATION;  Surgeon: Efraim Grange, MD;  Location: [facility] INVASIVE CV LAB;  Service: Cardiovascular;  Laterality: N/A;   ATRIAL FIBRILLATION ABLATION N/A 01/30/2023   Procedure: ATRIAL FIBRILLATION ABLATION;  Surgeon: Efraim Grange, MD;  Location: [facility] INVASIVE CV LAB;  Service: Cardiovascular;  Laterality: N/A;   CARDIOVERSION N/A 10/13/2021   Procedure: CARDIOVERSION;  Surgeon: Jerryl Morin, DO;  Location: [facility] ENDOSCOPY;  Service:  Cardiovascular;  Laterality: N/A;   CARDIOVERSION N/A 01/15/2023   Procedure: CARDIOVERSION;  Surgeon: Sheryle Donning, MD;  Location: [facility] INVASIVE CV LAB;  Service: Cardiovascular;  Laterality: N/A;   CATARACT EXTRACTION Bilateral    ESOPHAGOGASTRODUODENOSCOPY (EGD) WITH PROPOFOL  N/A 02/14/2021   Procedure: ESOPHAGOGASTRODUODENOSCOPY (EGD) WITH PROPOFOL ;  Surgeon: Alvis Jourdain, MD;  Location: [facility] ENDOSCOPY;  Service: Endoscopy;  Laterality: N/A;   HEMOSTASIS CLIP PLACEMENT  02/14/2021   Procedure: HEMOSTASIS CLIP PLACEMENT;  Surgeon: Alvis Jourdain, MD;  Location: [facility] ENDOSCOPY;  Service: Endoscopy;;   INSERTION OF ILIAC STENT Right 07/18/2022   Procedure: INSERTION OF ILIAC BRANCH DEVICE;  Surgeon: Adine Hoof, MD;  Location: [facility] OR;  Service: Vascular;  Laterality: Right;   TEE WITHOUT CARDIOVERSION N/A 10/09/2013   Procedure: TRANSESOPHAGEAL ECHOCARDIOGRAM (TEE);  Surgeon: Hazle Lites, MD;  Location: [facility] ENDOSCOPY;  Service: Cardiovascular;  Laterality: N/A;    Social History   Socioeconomic History   Marital status: Married    Spouse name: Jonathon Stevens   Number of children: 2   Years of education: college   Highest education level: Not on file  Occupational History   Occupation: retired   Tobacco Use   Smoking status: Former    Current packs/day: 0.00    Average packs/day: 0.5 packs/day for 15.0 years (7.5 ttl pk-yrs)    Types: Cigarettes    Start date: 07/12/2000    Quit date: 07/13/2015    Years since quitting: 8.1  Passive exposure: Never   Smokeless tobacco: Never   Tobacco comments:    Former smoker 10/11/21  Vaping Use   Vaping status: Never Used  Substance and Sexual Activity   Alcohol use: Yes    Alcohol/week: 1.0 standard drink of alcohol    Types: 1 Standard drinks or equivalent per week    Comment: 1 beer once a month 04/27/22   Drug use: No   Sexual activity: Yes  Other Topics Concern   Not on file  Social History Narrative   Patient is  married lives with Spouse Jonathon Stevens).   Patient is right handed.   Patient has a Automotive engineer Degree   Patient drinks 1 cup of coffee daily   Social Drivers of Corporate investment banker Strain: Not on file  Food Insecurity: Low Risk  (12/12/2022)   Received from [facility]   Hunger Vital Sign    Worried About Running Out of Food in the Last Year: Never true    Ran Out of Food in the Last Year: Never true  Transportation Needs: No Transportation Needs (12/12/2022)   Received from [employer]    In the past 12 months, has lack of reliable transportation kept you from medical appointments, meetings, work or from getting things needed for daily living? : No  Physical Activity: Not on file  Stress: Not on file  Social Connections: Not on file  Intimate Partner Violence: Not on file    Family History  Problem Relation Age of Onset   Hypertension Mother    Stroke Mother    Hypertension Father    Heart attack Neg Hx     Current Outpatient Medications  Medication Sig Dispense Refill   allopurinol  (ZYLOPRIM ) 300 MG tablet Take 150 mg by mouth daily after breakfast.     atorvastatin  (LIPITOR) 10 MG tablet TAKE 1 TABLET(10 MG) BY MOUTH DAILY 90 tablet 3   blood glucose meter kit and supplies KIT Dispense based on patient and insurance preference. Use up to four times daily as directed. (FOR ICD-9 250.00, 250.01). 1 each 0   CALCIUM  PO Take 1 tablet by mouth daily.     Cholecalciferol (VITAMIN D3) 125 MCG (5000 UT) CAPS Take 5,000 Units by mouth daily.     Coenzyme Q10 (COQ-10) 100 MG CAPS Take 100 mg by mouth daily.     GLUCOSAMINE-CHONDROITIN PO Take 1 tablet by mouth daily.      Magnesium  250 MG TABS Take 250 mg by mouth daily.     metoprolol  succinate (TOPROL -XL) 50 MG 24 hr tablet Take 1 tablet (50 mg total) by mouth daily. Take with or immediately following a meal. 90 tablet 3   Multiple Vitamin (MULTIVITAMIN) tablet Take 1 tablet by mouth daily.     pantoprazole   (PROTONIX ) 40 MG tablet Take 1 tablet (40 mg total) by mouth daily. 30 tablet 1   XARELTO  20 MG TABS tablet TAKE 1 TABLET(20 MG) BY MOUTH DAILY WITH SUPPER 90 tablet 0   No current facility-administered medications for this visit.    Allergies  Allergen Reactions   Flexeril [Cyclobenzaprine] Nausea Only   Indomethacin Rash     REVIEW OF SYSTEMS:   [X]  denotes positive finding, [ ]  denotes negative finding Cardiac  Comments:  Chest pain or chest pressure:    Shortness of breath upon exertion:    Short of breath when lying flat:    Irregular heart rhythm:        Vascular  Pain in calf, thigh, or hip brought on by ambulation:    Pain in feet at night that wakes you up from your sleep:     Blood clot in your veins:    Leg swelling:         Pulmonary    Oxygen at home:    Productive cough:     Wheezing:         Neurologic    Sudden weakness in arms or legs:     Sudden numbness in arms or legs:     Sudden onset of difficulty speaking or slurred speech:    Temporary loss of vision in one eye:     Problems with dizziness:         Gastrointestinal    Blood in stool:     Vomited blood:         Genitourinary    Burning when urinating:     Blood in urine:        Psychiatric    Major depression:         Hematologic    Bleeding problems:    Problems with blood clotting too easily:        Skin    Rashes or ulcers:        Constitutional    Fever or chills:      PHYSICAL EXAMINATION:  Vitals:   08/22/23 1052  BP: 119/70  Pulse: 67  Temp: 98.2 F (36.8 C)  TempSrc: Temporal  SpO2: 93%  Weight: 184 lb 6.4 oz (83.6 kg)  Height: 5\' 10"  (1.778 m)    General:  WDWN in NAD; vital signs documented above Gait: Not observed HENT: WNL, normocephalic Pulmonary: normal non-labored breathing , without Rales, rhonchi,  wheezing Cardiac: regular HR Abdomen: soft, NT, no masses Skin: without rashes Vascular Exam/Pulses: Palpable left DP greater than  right Extremities: without ischemic changes, without Gangrene , without cellulitis; without open wounds;  Musculoskeletal: no muscle wasting or atrophy  Neurologic: A&O X 3 Psychiatric:  The pt has Normal affect.   Non-Invasive Vascular Imaging:   EVAR duplex demonstrating 5 cm AAA sac with no endoleak     ASSESSMENT/PLAN:: 78 y.o. male here for follow up for surveillance of EVAR  Subjectively Mr. Mroczkowski is doing well from a surgery standpoint.  EVAR duplex demonstrates a 5 cm AAA sac without endoleak.  This is stable compared to measurement of 4.7 cm sac on postoperative CTA.  We will repeat duplex in 6 months.  If AAA sac is stable at that time we will follow on an annual basis.  He plans to follow-up with his cardiologist on Monday related to atrial fibrillation.   Cordie Deters, PA-C [facility] [phone]  Clinic MD:   Vikki Graves

## 2023-08-22 NOTE — Telephone Encounter (Signed)
 Pt reporting afib since Monday, avg HRs 120s.  According to his Apple Watch his afib burden 120%, was 2% week before.  Something just "doesn't feel right". Denies CP, edema, light headedness/dizziness SOB, low energy level Takes Toprol  50 mg every morning. Current BP 133/78, pulse 135.  Discussed with Michaelle Adolphus, PA.   Patient advised to take an extra 25 mg of Toprol  now.  Advised to call back if this does not help lower his HRs, until we can see him in office to discuss tx plan.  Scheduled pt to see Dr. Arlester Ladd on Monday to further discuss. Patient verbalized understanding and agreeable to plan.

## 2023-08-22 NOTE — Telephone Encounter (Signed)
 STAT if HR is under 50 or over 120  (normal HR is 60-100 beats per minute)  What is your heart rate? 126  Do you have a log of your heart rate readings (document readings)? 126 the last 3 days   Do you have any other symptoms? No pt states its hard to explain and would like to speak with a nurse.

## 2023-08-27 ENCOUNTER — Encounter: Payer: Self-pay | Admitting: Cardiovascular Disease

## 2023-08-27 ENCOUNTER — Ambulatory Visit: Attending: Cardiovascular Disease | Admitting: Cardiovascular Disease

## 2023-08-27 VITALS — BP 142/82 | HR 58 | Ht 70.0 in | Wt 184.9 lb

## 2023-08-27 DIAGNOSIS — I4821 Permanent atrial fibrillation: Secondary | ICD-10-CM

## 2023-08-27 DIAGNOSIS — I484 Atypical atrial flutter: Secondary | ICD-10-CM

## 2023-08-27 NOTE — Patient Instructions (Signed)

## 2023-08-27 NOTE — Progress Notes (Signed)
 Electrophysiology Office Note:    Date:  08/27/2023   ID:  Jonathon Stevens, DOB 30-Dec-1945, MRN [id number]  PCP:  Tura Gaines, MD   [facility] Providers Cardiologist:  Avery Bodo, MD Electrophysiologist:  Efraim Grange, MD     Referring MD: Tura Gaines, MD   History of Present Illness:    Jonathon Stevens is a 78 y.o. male with a hx listed below, significant for atrial fibrillation, stroke, referred for arrhythmia management.  He was originally diagnosed with atrial fibrillation years ago during routine monitoring after a stroke.  It was not until winter 2021 when he was hospitalized with Covid pneumonia that he began to have recurrent symptomatic episodes. Dofetilide  was started and controlled his AF for about 5 months, but he then began to have recurrence.  He monitors his atrial fibrillation with his Apple Watch and has noticed that the burden has increased from about 10% up to roughly 50% over the past several months.  He feels very fatigued and short of breath with atrial fibrillation.  He has not had any syncope, presyncope, anginal chest pain.  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  The patient was in atypical flutter that terminated during isolation of the posterior wall.   He did well for a while after the ablation but began to have elevated rates and irregular rhythms with increasing frequency.  And underwent repeat ablation for atypical atrial flutter and fibrillation.  During the procedure he had reablation of the left-sided pulmonary veins and the posterior wall of the atrium.  Additionally, an anterior mitral line was performed for atypical flutter and the cavotricuspid isthmus was ablated for typical atrial flutter.  He monitors his rhythm with an Apple watch.  Last week he noticed that his rate had increased to about 126 beats a minute for a day and a half.  He took an additional 25 mg of metoprolol   which resulted in slowing of his heart rate back to normal.  He does feel fatigued.    EKGs/Labs/Other Studies Reviewed Today:       EKG:  Last EKG results: today Sinus rhythm with PVCs   Recent Labs: 10/04/2022: Magnesium  2.1 01/23/2023: Hemoglobin 15.1; Platelets 182 08/21/2023: BUN 15; Creatinine, Ser 1.26; Potassium 5.0; Sodium 140     Physical Exam:    VS:  BP (!) 142/82 (BP Location: Left Arm, Patient Position: Sitting, Cuff Size: Normal)   Pulse (!) 58   Ht 5\' 10"  (1.778 m)   Wt 184 lb 14.4 oz (83.9 kg)   SpO2 93%   BMI 26.53 kg/m     Wt Readings from Last 3 Encounters:  08/27/23 184 lb 14.4 oz (83.9 kg)  08/22/23 184 lb 6.4 oz (83.6 kg)  06/15/23 184 lb 3.2 oz (83.6 kg)     GEN: Well nourished, well developed in no acute distress CARDIAC: RRR, no murmurs, rubs, gallops RESPIRATORY:  Normal work of breathing MUSCULOSKELETAL: no edema    ASSESSMENT & PLAN:    Atrial fibrillation:  persistent, on Tikosyn   Status post pulmonary vein posterior wall isolation June 2024 Transseptal access was very difficult Monitoring his rhythm with an Apple watch. He has not had recurrence of atrial fibrillation, but there have been some regular episodes of tachycardia with a ventricular rate of 110 to 120 bpm or so If he has increasing frequency of recurrences, we will need to consider starting back on Tikosyn .    Atypical atrial flutter  Posterior wall dependent flutter present during EP study June 2024  Stroke: continue xarelto  20mg  BID HTN: no changes today        Medication Adjustments/Labs and Tests Ordered: Current medicines are reviewed at length with the patient today.  Concerns regarding medicines are outlined above.  Orders Placed This Encounter  Procedures   EKG 12-Lead   No orders of the defined types were placed in this encounter.    Signed, Efraim Grange, MD  08/27/2023 2:39 PM    [facility]

## 2023-10-12 ENCOUNTER — Other Ambulatory Visit: Payer: Self-pay | Admitting: Internal Medicine

## 2023-10-29 ENCOUNTER — Encounter (HOSPITAL_COMMUNITY)

## 2023-10-29 ENCOUNTER — Encounter: Admitting: Surgery

## 2023-11-19 ENCOUNTER — Other Ambulatory Visit: Payer: Self-pay | Admitting: Cardiovascular Disease

## 2023-11-19 DIAGNOSIS — I480 Paroxysmal atrial fibrillation: Secondary | ICD-10-CM

## 2023-11-19 NOTE — Telephone Encounter (Signed)
 Prescription refill request for Xarelto  received.  Indication:afib Last office visit:6/25 Weight:83.9  kg Age:78 Scr:1.26  5/25 CrCl:57.34  ml/min  Prescription refilled

## 2023-12-19 ENCOUNTER — Telehealth: Payer: Self-pay | Admitting: Pulmonary Disease

## 2023-12-19 NOTE — Telephone Encounter (Signed)
 Patient is calling to follow. He is concerned that new medication will interact with his xarelto 

## 2023-12-19 NOTE — Telephone Encounter (Signed)
 Spoke to patient stated he called this morning wanting to know if better to take Jardiance or Farxiga.Advised message already sent to pharmacy team.

## 2023-12-19 NOTE — Telephone Encounter (Signed)
 Routed to pharmacy for med review then will follow up with patient.

## 2023-12-19 NOTE — Telephone Encounter (Signed)
 Pt had his annual physical with his PCP and was recommended Jardiance or Invokana Farxiga for his high A1C. He would like to speak with someone about potential interactions with his cardiac medications. Please advise.

## 2023-12-20 NOTE — Telephone Encounter (Signed)
 Spoke with pt and let him know of pharmacy recommendation/ response. Pt verbalized understanding of plan and said he would be reaching out to PCP. No further questions at this time.

## 2023-12-20 NOTE — Telephone Encounter (Signed)
 Either Jardiance or Farxiga would be fine. They both have very similar data.  No interaction with Xarelto  or his other meds

## 2024-01-25 ENCOUNTER — Other Ambulatory Visit: Payer: Self-pay

## 2024-01-25 DIAGNOSIS — I714 Abdominal aortic aneurysm, without rupture, unspecified: Secondary | ICD-10-CM

## 2024-02-24 ENCOUNTER — Other Ambulatory Visit: Payer: Self-pay | Admitting: Pulmonary Disease

## 2024-03-05 ENCOUNTER — Ambulatory Visit (HOSPITAL_COMMUNITY)
Admission: RE | Admit: 2024-03-05 | Discharge: 2024-03-05 | Disposition: A | Discharge status: Home / Self Care | Source: Ambulatory Visit | Attending: Surgery | Admitting: Surgery

## 2024-03-05 ENCOUNTER — Ambulatory Visit

## 2024-03-05 VITALS — BP 149/66 | HR 55 | Temp 97.7°F | Wt 183.7 lb

## 2024-03-05 DIAGNOSIS — I723 Aneurysm of iliac artery: Secondary | ICD-10-CM

## 2024-03-05 DIAGNOSIS — I714 Abdominal aortic aneurysm, without rupture, unspecified: Secondary | ICD-10-CM | POA: Diagnosis present

## 2024-03-05 NOTE — Progress Notes (Signed)
 Office Note     CC:  follow up Requesting Provider:  Tanda Prentice DEL, MD  HPI: Jonathon Stevens is a 78 y.o. 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 Sheree on 07/18/2022.  He denies any new or changing abdominal or back pain.  He has history of right 2 different ablation procedures last year for atrial fibrillation.  He has episodic shortness of breath with minimal activity and plans to contact his cardiologist for further evaluation.  Fortunately he denies claudication, rest pain, or tissue loss of bilateral lower extremities.  He is on Xarelto  daily.  He is also on a daily statin.  He denies tobacco use.   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 ECHO 10/08/13: EF 45-50%, mild hypokinesis of apical myocardium. Mild LA dilation, mild RA dilation;  b.  TEE (10/09/13):  EF 50-55%, normal wall motion, no LAA clot   Hypertension    Pneumonia    Stroke [facility])     Past Surgical History:  Procedure Laterality Date   ABDOMINAL AORTIC ENDOVASCULAR STENT GRAFT N/A 07/18/2022   Procedure: ABDOMINAL AORTIC ENDOVASCULAR STENT GRAFT;  Surgeon: Sheree Penne Bruckner, MD;  Location: [facility] OR;  Service: Vascular;  Laterality: N/A;   ATRIAL FIBRILLATION ABLATION N/A 09/06/2022   Procedure: ATRIAL FIBRILLATION ABLATION;  Surgeon: Nancey Eulas BRAVO, MD;  Location: [facility] INVASIVE CV LAB;  Service: Cardiovascular;  Laterality: N/A;   ATRIAL FIBRILLATION ABLATION N/A 01/30/2023   Procedure: ATRIAL FIBRILLATION ABLATION;  Surgeon: Nancey Eulas BRAVO, MD;  Location: [facility] INVASIVE CV LAB;  Service: Cardiovascular;  Laterality: N/A;   CARDIOVERSION N/A 10/13/2021   Procedure:  CARDIOVERSION;  Surgeon: Sheena Pugh, DO;  Location: [facility] ENDOSCOPY;  Service: Cardiovascular;  Laterality: N/A;   CARDIOVERSION N/A 01/15/2023   Procedure: CARDIOVERSION;  Surgeon: Bruckner Slain, MD;  Location: [facility] INVASIVE CV LAB;  Service: Cardiovascular;  Laterality: N/A;   CATARACT EXTRACTION Bilateral    ESOPHAGOGASTRODUODENOSCOPY (EGD) WITH PROPOFOL  N/A 02/14/2021   Procedure: ESOPHAGOGASTRODUODENOSCOPY (EGD) WITH PROPOFOL ;  Surgeon: Rollin Dover, MD;  Location: [facility] ENDOSCOPY;  Service: Endoscopy;  Laterality: N/A;   HEMOSTASIS CLIP PLACEMENT  02/14/2021   Procedure: HEMOSTASIS CLIP PLACEMENT;  Surgeon: Rollin Dover, MD;  Location: [facility] ENDOSCOPY;  Service: Endoscopy;;   INSERTION OF ILIAC STENT Right 07/18/2022   Procedure: INSERTION OF ILIAC BRANCH DEVICE;  Surgeon: Sheree Penne Bruckner, MD;  Location: [facility] OR;  Service: Vascular;  Laterality: Right;   TEE WITHOUT CARDIOVERSION N/A 10/09/2013   Procedure: TRANSESOPHAGEAL ECHOCARDIOGRAM (TEE);  Surgeon: Vinie KYM Maxcy, MD;  Location: [facility] ENDOSCOPY;  Service: Cardiovascular;  Laterality: N/A;    Social History   Socioeconomic History   Marital status: Married    Spouse name: Nathanel   Number of children: 2   Years of education: college   Highest education level: Not on file  Occupational History   Occupation: retired   Tobacco Use   Smoking status: Former    Current packs/day: 0.00    Average packs/day: 0.5 packs/day for 15.0 years (7.5 ttl pk-yrs)    Types: Cigarettes    Start date: 07/12/2000  Quit date: 07/13/2015    Years since quitting: 8.6    Passive exposure: Never   Smokeless tobacco: Never   Tobacco comments:    Former smoker 10/11/21  Vaping Use   Vaping status: Never Used  Substance and Sexual Activity   Alcohol use: Yes    Alcohol/week: 1.0 standard drink of alcohol    Types: 1 Standard drinks or equivalent per week    Comment: 1 beer once a month 04/27/22   Drug use: No   Sexual activity: Yes   Other Topics Concern   Not on file  Social History Narrative   Patient is married lives with Spouse Bobbetta).   Patient is right handed.   Patient has a Automotive Engineer Degree   Patient drinks 1 cup of coffee daily   Social Drivers of Corporate Investment Banker Strain: Not on file  Food Insecurity: Unknown (12/10/2023)   Received from [facility]   Hunger Vital Sign    Within the past 12 months, you worried that your food would run out before you got money to buy more: Patient declined to answer    Within the past 12 months, the food you bought just didn't last and you didn't have money to get more. : Patient declined to answer  Transportation Needs: No Transportation Needs (12/10/2023)   Received from [facility]    In the past 12 months, has lack of reliable transportation kept you from medical appointments, meetings, work or from getting things needed for daily living? : No  Physical Activity: Not on file  Stress: Not on file  Social Connections: Not on file  Intimate Partner Violence: Not on file    Family History  Problem Relation Age of Onset   Hypertension Mother    Stroke Mother    Hypertension Father    Heart attack Neg Hx     Current Outpatient Medications  Medication Sig Dispense Refill   allopurinol  (ZYLOPRIM ) 300 MG tablet Take 150 mg by mouth daily after breakfast.     atorvastatin  (LIPITOR) 10 MG tablet TAKE 1 TABLET(10 MG) BY MOUTH DAILY 90 tablet 1   blood glucose meter kit and supplies KIT Dispense based on patient and insurance preference. Use up to four times daily as directed. (FOR ICD-9 250.00, 250.01). 1 each 0   CALCIUM  PO Take 1 tablet by mouth daily.     Cholecalciferol (VITAMIN D3) 125 MCG (5000 UT) CAPS Take 5,000 Units by mouth daily.     Coenzyme Q10 (COQ-10) 100 MG CAPS Take 100 mg by mouth daily.     GLUCOSAMINE-CHONDROITIN PO Take 1 tablet by mouth daily.      JARDIANCE 25 MG TABS tablet Take 25 mg by mouth daily.     Magnesium   250 MG TABS Take 250 mg by mouth daily.     metoprolol  succinate (TOPROL -XL) 50 MG 24 hr tablet Take 1 tablet (50 mg total) by mouth daily. Take with or immediately following a meal. 90 tablet 3   Multiple Vitamin (MULTIVITAMIN) tablet Take 1 tablet by mouth daily.     pantoprazole  (PROTONIX ) 40 MG tablet Take 1 tablet (40 mg total) by mouth daily. 30 tablet 1   rivaroxaban  (XARELTO ) 20 MG TABS tablet TAKE 1 TABLET(20 MG) BY MOUTH DAILY WITH SUPPER 90 tablet 1   No current facility-administered medications for this visit.    Allergies  Allergen Reactions   Flexeril [Cyclobenzaprine] Nausea Only   Indomethacin Rash  REVIEW OF SYSTEMS:  Negative unless noted in HPI [X]  denotes positive finding, [ ]  denotes negative finding Cardiac  Comments:  Chest pain or chest pressure:    Shortness of breath upon exertion:    Short of breath when lying flat:    Irregular heart rhythm:        Vascular    Pain in calf, thigh, or hip brought on by ambulation:    Pain in feet at night that wakes you up from your sleep:     Blood clot in your veins:    Leg swelling:         Pulmonary    Oxygen at home:    Productive cough:     Wheezing:         Neurologic    Sudden weakness in arms or legs:     Sudden numbness in arms or legs:     Sudden onset of difficulty speaking or slurred speech:    Temporary loss of vision in one eye:     Problems with dizziness:         Gastrointestinal    Blood in stool:     Vomited blood:         Genitourinary    Burning when urinating:     Blood in urine:        Psychiatric    Major depression:         Hematologic    Bleeding problems:    Problems with blood clotting too easily:        Skin    Rashes or ulcers:        Constitutional    Fever or chills:      PHYSICAL EXAMINATION:  Vitals:   03/05/24 1038  BP: (!) 149/66  Pulse: (!) 55  Temp: 97.7 F (36.5 C)  TempSrc: Temporal  Weight: 183 lb 11.2 oz (83.3 kg)    General:  WDWN in  NAD; vital signs documented above Gait: Not observed HENT: WNL, normocephalic Pulmonary: normal non-labored breathing Cardiac: regular HR Abdomen: soft, NT, no masses Skin: without rashes Vascular Exam/Pulses: Palpable left DP; unable to palpate right DP or PT Extremities: without ischemic changes, without Gangrene , without cellulitis; without open wounds;  Musculoskeletal: no muscle wasting or atrophy  Neurologic: A&O X 3 Psychiatric:  The pt has Normal affect.   Non-Invasive Vascular Imaging:    AAA sac measuring 4.6 cm in largest diameter Right limb measuring 2.1 cm in diameter increased from 1.6     ASSESSMENT/PLAN:: 78 y.o. male here for follow up for surveillance of EVAR  Mr. 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  He denies any new or changing abdominal or back pain.  Duplex demonstrates a shrinking AAA sac without endoleak.  His right limb measures 2.1 cm in diameter today which has increased from 1.6 cm in diameter during last visit.  There is no evidence of endoleak currently.  We will repeat duplex in 6 months.  If diameter increases we will obtain a CTA.  If findings are stable or improved we will follow patient on an annual basis.  It should also be noted that I recommended following up with cardiology due to episodic shortness of breath with history of 2 ablation procedures for atrial fibrillation.   Jonathon Sender, PA-C [facility] [phone]  Clinic MD:   Sheree

## 2024-03-06 ENCOUNTER — Other Ambulatory Visit: Payer: Self-pay | Admitting: *Deleted

## 2024-03-06 DIAGNOSIS — Z9889 Other specified postprocedural states: Secondary | ICD-10-CM

## 2024-03-10 ENCOUNTER — Other Ambulatory Visit: Payer: Self-pay

## 2024-03-10 DIAGNOSIS — I480 Paroxysmal atrial fibrillation: Secondary | ICD-10-CM

## 2024-03-10 MED ORDER — RIVAROXABAN 20 MG PO TABS: 20.0000 mg | ORAL_TABLET | Freq: Every day | ORAL | 1 refills | Status: AC

## 2024-03-12 MED ORDER — METOPROLOL SUCCINATE ER 50 MG PO TB24: 50.0000 mg | ORAL_TABLET | Freq: Every day | ORAL | 1 refills | Status: AC

## 2024-04-01 ENCOUNTER — Other Ambulatory Visit: Payer: Self-pay | Admitting: Internal Medicine

## 2024-04-04 ENCOUNTER — Other Ambulatory Visit (HOSPITAL_BASED_OUTPATIENT_CLINIC_OR_DEPARTMENT_OTHER): Payer: Self-pay | Admitting: Family Medicine

## 2024-04-04 DIAGNOSIS — J849 Interstitial pulmonary disease, unspecified: Secondary | ICD-10-CM

## 2024-06-24 ENCOUNTER — Ambulatory Visit (HOSPITAL_BASED_OUTPATIENT_CLINIC_OR_DEPARTMENT_OTHER): Admitting: Cardiology

## 2024-08-26 ENCOUNTER — Ambulatory Visit: Admitting: Cardiovascular Disease

## 2024-09-03 ENCOUNTER — Ambulatory Visit (HOSPITAL_COMMUNITY)

## 2024-09-03 ENCOUNTER — Ambulatory Visit
